# Patient Record
Sex: Male | Born: 1965 | Race: White | Hispanic: No | Marital: Single | Smoking: Never smoker
Health system: Southern US, Community
[De-identification: ages and names within clinical notes are randomized; demographics above are authoritative.]

## PROBLEM LIST (undated history)

## (undated) VITALS — BP 121/90 | HR 89 | Temp 97.3°F | Resp 20 | Ht 75.0 in | Wt 210.0 lb

## (undated) DIAGNOSIS — F32A Depression, unspecified: Secondary | ICD-10-CM

## (undated) DIAGNOSIS — D649 Anemia, unspecified: Secondary | ICD-10-CM

## (undated) DIAGNOSIS — Z8719 Personal history of other diseases of the digestive system: Secondary | ICD-10-CM

## (undated) DIAGNOSIS — F4001 Agoraphobia with panic disorder: Secondary | ICD-10-CM

## (undated) DIAGNOSIS — F849 Pervasive developmental disorder, unspecified: Secondary | ICD-10-CM

## (undated) DIAGNOSIS — N189 Chronic kidney disease, unspecified: Secondary | ICD-10-CM

## (undated) DIAGNOSIS — R569 Unspecified convulsions: Secondary | ICD-10-CM

## (undated) DIAGNOSIS — E785 Hyperlipidemia, unspecified: Secondary | ICD-10-CM

## (undated) DIAGNOSIS — F329 Major depressive disorder, single episode, unspecified: Secondary | ICD-10-CM

## (undated) DIAGNOSIS — F410 Panic disorder [episodic paroxysmal anxiety] without agoraphobia: Secondary | ICD-10-CM

## (undated) HISTORY — PX: WISDOM TOOTH EXTRACTION: SHX21

## (undated) HISTORY — DX: Agoraphobia with panic disorder: F40.01

## (undated) HISTORY — DX: Panic disorder (episodic paroxysmal anxiety): F41.0

## (undated) HISTORY — PX: MOLE REMOVAL: SHX2046

---

## 2002-05-20 ENCOUNTER — Emergency Department (HOSPITAL_COMMUNITY): Admission: EM | Admit: 2002-05-20 | Discharge: 2002-05-21 | Payer: Self-pay | Admitting: *Deleted

## 2003-11-21 ENCOUNTER — Inpatient Hospital Stay (HOSPITAL_COMMUNITY): Admission: EM | Admit: 2003-11-21 | Discharge: 2003-11-23 | Payer: Self-pay | Admitting: Psychiatry

## 2004-04-27 ENCOUNTER — Inpatient Hospital Stay (HOSPITAL_COMMUNITY): Admission: RE | Admit: 2004-04-27 | Discharge: 2004-05-08 | Payer: Self-pay | Admitting: Psychiatry

## 2004-05-09 ENCOUNTER — Emergency Department (HOSPITAL_COMMUNITY): Admission: EM | Admit: 2004-05-09 | Discharge: 2004-05-10 | Payer: Self-pay | Admitting: Emergency Medicine

## 2004-11-20 ENCOUNTER — Emergency Department (HOSPITAL_COMMUNITY): Admission: EM | Admit: 2004-11-20 | Discharge: 2004-11-20 | Payer: Self-pay | Admitting: Emergency Medicine

## 2004-12-14 ENCOUNTER — Emergency Department (HOSPITAL_COMMUNITY): Admission: EM | Admit: 2004-12-14 | Discharge: 2004-12-14 | Payer: Self-pay | Admitting: Emergency Medicine

## 2005-01-16 ENCOUNTER — Ambulatory Visit: Payer: Self-pay | Admitting: Family Medicine

## 2005-02-21 ENCOUNTER — Ambulatory Visit: Payer: Self-pay | Admitting: Family Medicine

## 2005-03-06 ENCOUNTER — Emergency Department (HOSPITAL_COMMUNITY): Admission: EM | Admit: 2005-03-06 | Discharge: 2005-03-07 | Payer: Self-pay | Admitting: Emergency Medicine

## 2005-03-19 ENCOUNTER — Ambulatory Visit (HOSPITAL_COMMUNITY): Admission: RE | Admit: 2005-03-19 | Discharge: 2005-03-19 | Payer: Self-pay | Admitting: Family Medicine

## 2005-07-18 ENCOUNTER — Ambulatory Visit: Payer: Self-pay | Admitting: Family Medicine

## 2006-01-15 IMAGING — CR DG LUMBAR SPINE COMPLETE 4+V
5 series · 5 of 5 positions shown · non-contrast
Comparison: none

CLINICAL DATA: Low to mid back pain x one month.  Fell out of bed. 
LUMBAR SPINE - 2 VIEWS:
There are no fractures or subluxations.  There is mild L4-5 interspace narrowing.  There are five lumbar type vertebrae present.  There are osteophytic changes noted at the L3-4 and L4-5 levels.

[t l-spine a.p.]
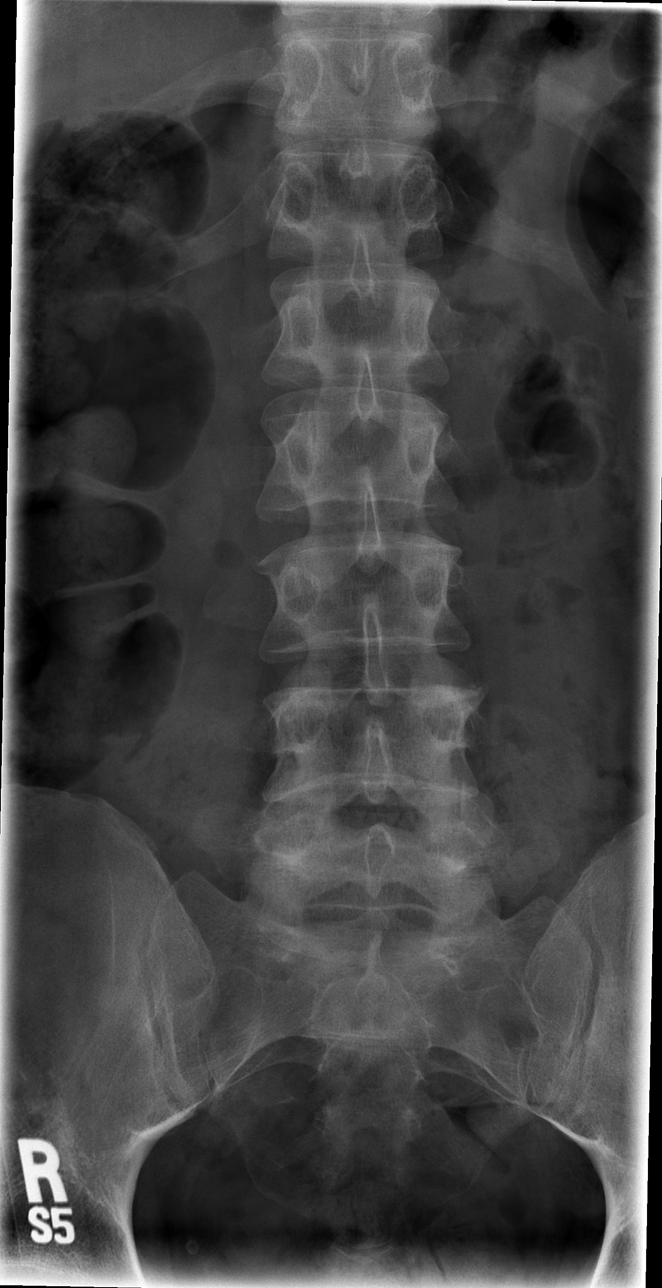

[t l-spine oblique exposure (1 of 2)]
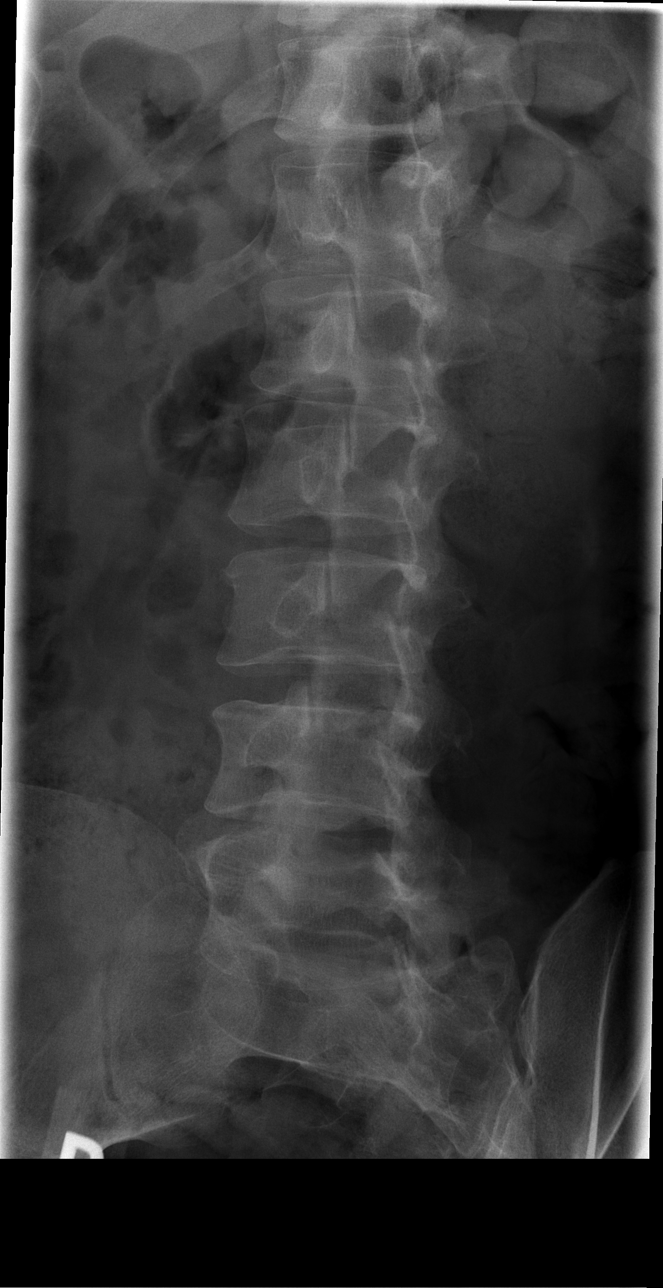

[t l-spine oblique exposure (2 of 2)]
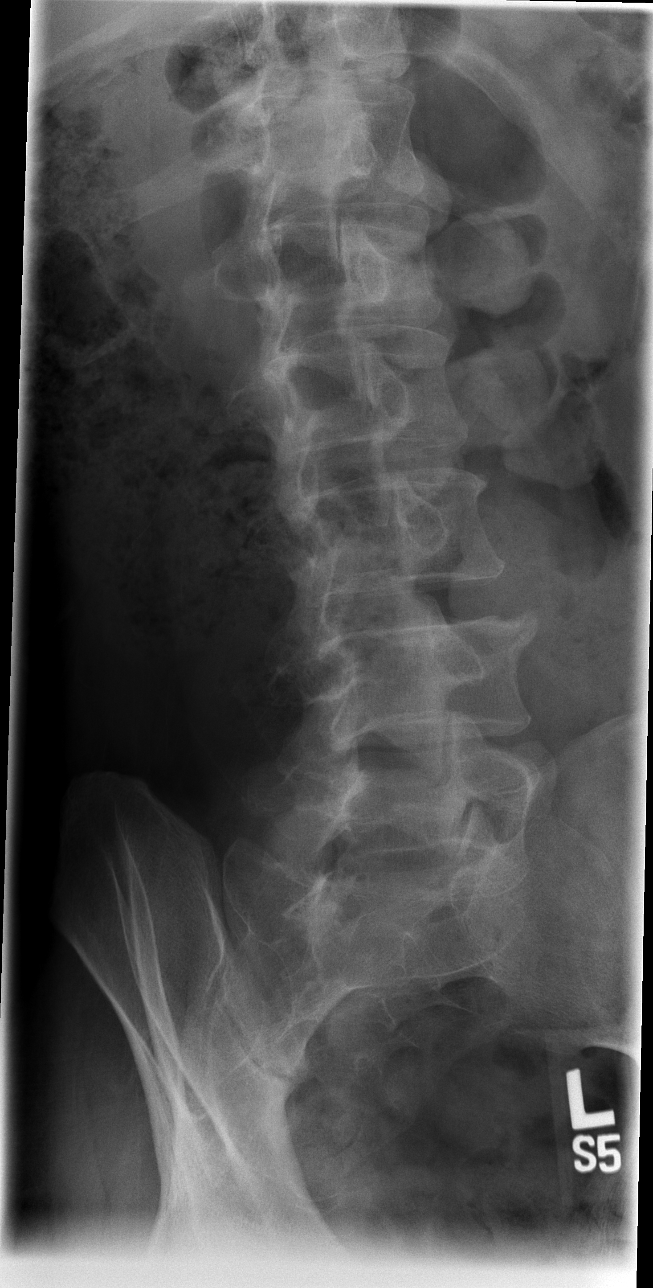

[t l-spine lat]
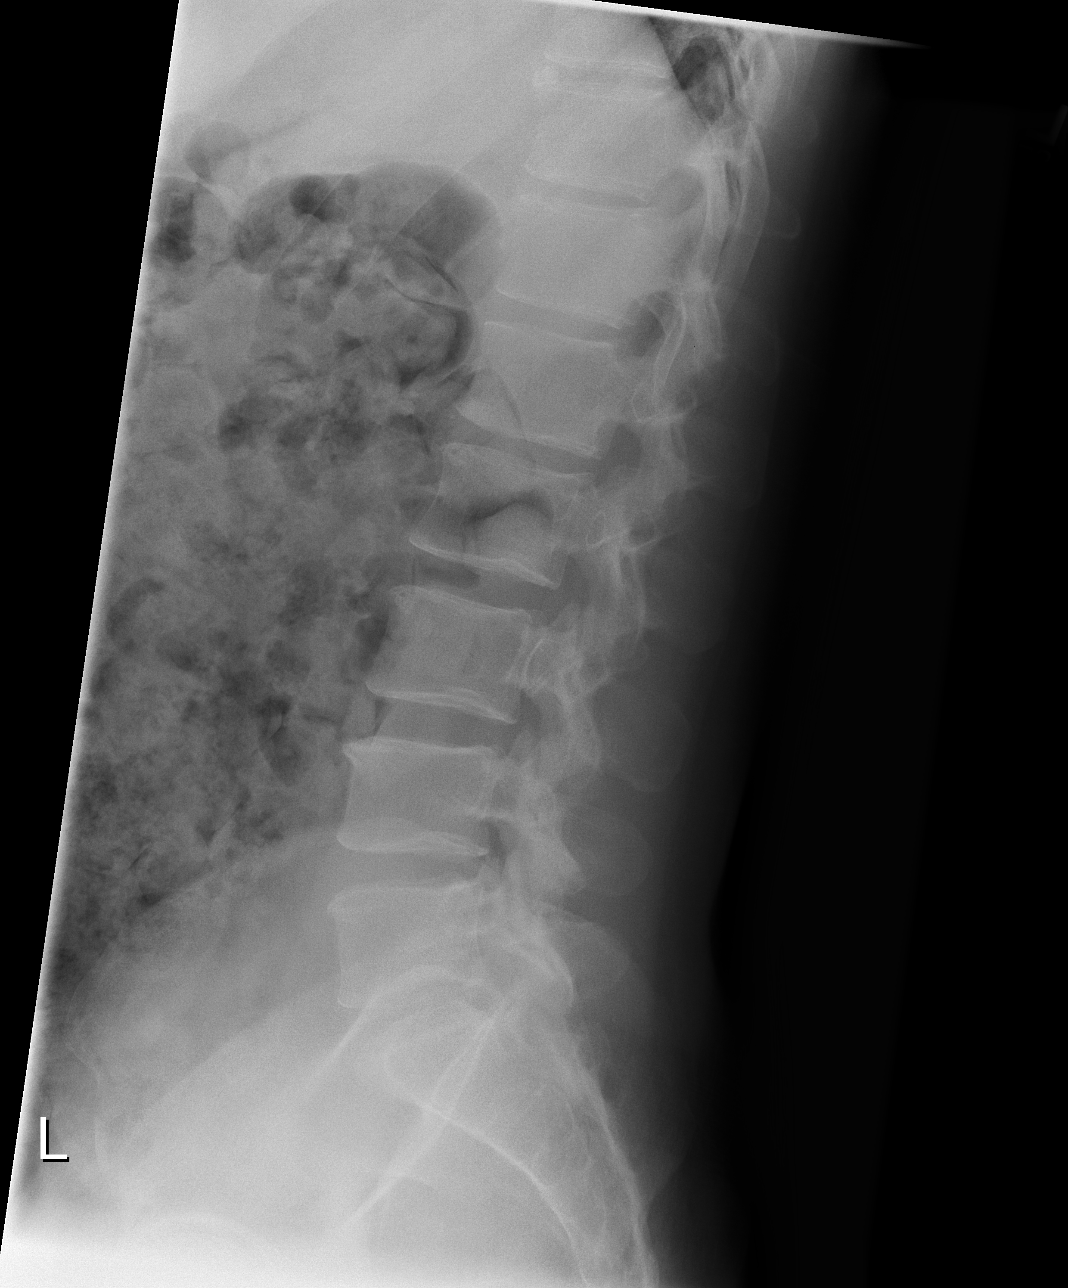

[t l-spine l5-s1 spot]
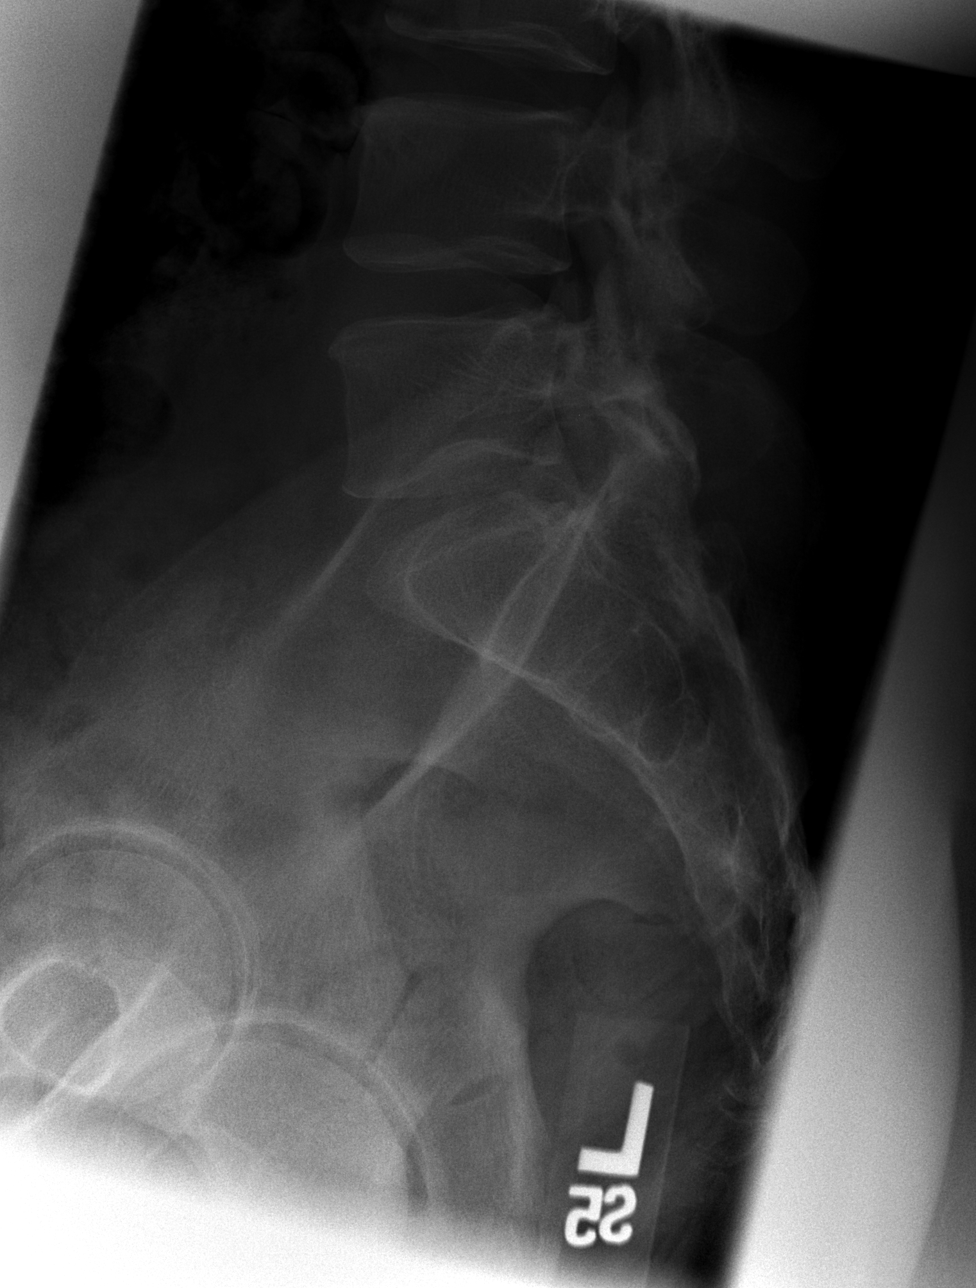

[5 of 5 positions shown; findings below may reference images not displayed]

IMPRESSION: Mild L4-5 interspace narrowing.  No fractures or subluxations.

## 2006-04-11 ENCOUNTER — Ambulatory Visit: Payer: Self-pay | Admitting: Family Medicine

## 2006-07-14 ENCOUNTER — Ambulatory Visit: Payer: Self-pay | Admitting: Family Medicine

## 2006-10-01 ENCOUNTER — Ambulatory Visit: Payer: Self-pay | Admitting: Psychiatry

## 2006-10-01 ENCOUNTER — Inpatient Hospital Stay (HOSPITAL_COMMUNITY): Admission: EM | Admit: 2006-10-01 | Discharge: 2006-10-06 | Payer: Self-pay | Admitting: Psychiatry

## 2007-01-22 ENCOUNTER — Emergency Department (HOSPITAL_COMMUNITY): Admission: EM | Admit: 2007-01-22 | Discharge: 2007-01-22 | Payer: Self-pay | Admitting: Emergency Medicine

## 2007-01-28 ENCOUNTER — Emergency Department (HOSPITAL_COMMUNITY): Admission: EM | Admit: 2007-01-28 | Discharge: 2007-01-28 | Payer: Self-pay | Admitting: Emergency Medicine

## 2007-07-07 ENCOUNTER — Ambulatory Visit: Payer: Self-pay | Admitting: Family Medicine

## 2007-07-07 LAB — CONVERTED CEMR LAB
Albumin: 4.6 g/dL (ref 3.5–5.2)
BUN: 11 mg/dL (ref 6–23)
Calcium: 9.1 mg/dL (ref 8.4–10.5)
Chloride: 106 meq/L (ref 96–112)
Creatinine, Ser: 1.21 mg/dL (ref 0.40–1.50)
Glucose, Bld: 116 mg/dL — ABNORMAL HIGH (ref 70–99)
HDL: 41 mg/dL (ref >39)
LDL Cholesterol: 55 mg/dL (ref 0–99)
Sodium: 144 meq/L (ref 135–145)
Total CHOL/HDL Ratio: 2.8
VLDL: 17 mg/dL (ref 0–40)

## 2007-07-23 ENCOUNTER — Ambulatory Visit: Payer: Self-pay | Admitting: Family Medicine

## 2007-08-06 ENCOUNTER — Ambulatory Visit: Payer: Self-pay | Admitting: Family Medicine

## 2007-08-06 LAB — CONVERTED CEMR LAB
Basophils Absolute: 0 10*3/uL (ref 0.0–0.1)
Basophils Relative: 0 % (ref 0–1)
HCT: 42.7 % (ref 39.0–52.0)
HDL: 40 mg/dL (ref >39)
Hemoglobin: 14.8 g/dL (ref 13.0–17.0)
LDL Cholesterol: 47 mg/dL (ref 0–99)
Lymphocytes Relative: 27 % (ref 12–46)
MCHC: 34.7 g/dL (ref 30.0–36.0)
MCV: 89.5 fL (ref 78.0–100.0)
RBC: 4.77 M/uL (ref 4.22–5.81)
Total CHOL/HDL Ratio: 2.7
Triglycerides: 103 mg/dL (ref <150)
VLDL: 21 mg/dL (ref 0–40)
WBC: 5.4 10*3/uL (ref 4.0–10.5)

## 2008-08-01 ENCOUNTER — Ambulatory Visit: Payer: Self-pay | Admitting: Internal Medicine

## 2008-08-01 ENCOUNTER — Encounter (INDEPENDENT_AMBULATORY_CARE_PROVIDER_SITE_OTHER): Payer: Self-pay | Admitting: Family Medicine

## 2008-08-01 LAB — CONVERTED CEMR LAB
ALT: 15 units/L (ref 0–53)
AST: 13 units/L (ref 0–37)
BUN: 12 mg/dL (ref 6–23)
Calcium: 9 mg/dL (ref 8.4–10.5)
Cholesterol: 154 mg/dL (ref 0–200)
Creatinine, Ser: 1.2 mg/dL (ref 0.40–1.50)
Eosinophils Relative: 1 % (ref 0–5)
Lymphocytes Relative: 27 % (ref 12–46)
MCV: 87.9 fL (ref 78.0–100.0)
Monocytes Absolute: 0.6 10*3/uL (ref 0.1–1.0)
Neutro Abs: 3.8 10*3/uL (ref 1.7–7.7)
Neutrophils Relative %: 61 % (ref 43–77)
Platelets: 158 10*3/uL (ref 150–400)
Potassium: 4 meq/L (ref 3.5–5.3)
RBC: 4.86 M/uL (ref 4.22–5.81)
Total Bilirubin: 0.4 mg/dL (ref 0.3–1.2)
Total Protein: 7.2 g/dL (ref 6.0–8.3)
VLDL: 26 mg/dL (ref 0–40)
Vit D, 1,25-Dihydroxy: 6 — ABNORMAL LOW (ref 30–89)
WBC: 6.3 10*3/uL (ref 4.0–10.5)

## 2008-09-16 ENCOUNTER — Ambulatory Visit: Payer: Self-pay | Admitting: Family Medicine

## 2008-12-23 ENCOUNTER — Ambulatory Visit: Payer: Self-pay | Admitting: Family Medicine

## 2009-01-26 ENCOUNTER — Ambulatory Visit: Payer: Self-pay | Admitting: Family Medicine

## 2009-01-26 LAB — CONVERTED CEMR LAB
Albumin: 4.9 g/dL (ref 3.5–5.2)
Calcium: 9.1 mg/dL (ref 8.4–10.5)
Cholesterol: 162 mg/dL (ref 0–200)
Creatinine, Ser: 1.15 mg/dL (ref 0.40–1.50)
Eosinophils Absolute: 0.1 10*3/uL (ref 0.0–0.7)
Eosinophils Relative: 2 % (ref 0–5)
HCT: 44.7 % (ref 39.0–52.0)
Hemoglobin: 15.1 g/dL (ref 13.0–17.0)
LDL Cholesterol: 94 mg/dL (ref 0–99)
Lymphs Abs: 1.3 10*3/uL (ref 0.7–4.0)
MCHC: 33.8 g/dL (ref 30.0–36.0)
Monocytes Absolute: 0.4 10*3/uL (ref 0.1–1.0)
Monocytes Relative: 8 % (ref 3–12)
Neutro Abs: 2.8 10*3/uL (ref 1.7–7.7)
Neutrophils Relative %: 63 % (ref 43–77)
RBC: 4.99 M/uL (ref 4.22–5.81)
Total Bilirubin: 0.5 mg/dL (ref 0.3–1.2)
Total CHOL/HDL Ratio: 4
Total Protein: 7.5 g/dL (ref 6.0–8.3)
Triglycerides: 134 mg/dL (ref <150)
VLDL: 27 mg/dL (ref 0–40)
WBC: 4.5 10*3/uL (ref 4.0–10.5)

## 2009-02-20 ENCOUNTER — Ambulatory Visit: Payer: Self-pay | Admitting: Family Medicine

## 2009-08-03 ENCOUNTER — Ambulatory Visit: Payer: Self-pay | Admitting: Family Medicine

## 2009-10-20 ENCOUNTER — Ambulatory Visit: Payer: Self-pay | Admitting: Family Medicine

## 2009-10-20 LAB — CONVERTED CEMR LAB
ALT: 16 units/L (ref 0–53)
AST: 14 units/L (ref 0–37)
Albumin: 4.6 g/dL (ref 3.5–5.2)
Alkaline Phosphatase: 75 units/L (ref 39–117)
Calcium: 9.1 mg/dL (ref 8.4–10.5)
Chloride: 107 meq/L (ref 96–112)
Cholesterol: 139 mg/dL (ref 0–200)
LDL Cholesterol: 74 mg/dL (ref 0–99)
Total Protein: 6.8 g/dL (ref 6.0–8.3)

## 2010-04-05 ENCOUNTER — Ambulatory Visit: Payer: Self-pay | Admitting: Family Medicine

## 2010-10-16 ENCOUNTER — Encounter (INDEPENDENT_AMBULATORY_CARE_PROVIDER_SITE_OTHER): Payer: Self-pay | Admitting: Family Medicine

## 2010-10-16 ENCOUNTER — Ambulatory Visit: Payer: Self-pay | Admitting: Family Medicine

## 2010-10-16 LAB — CONVERTED CEMR LAB
Alkaline Phosphatase: 101 units/L (ref 39–117)
BUN: 15 mg/dL (ref 6–23)
Basophils Relative: 0 % (ref 0–1)
CO2: 22 meq/L (ref 19–32)
Calcium: 8.8 mg/dL (ref 8.4–10.5)
Chloride: 104 meq/L (ref 96–112)
Creatinine, Ser: 1.08 mg/dL (ref 0.40–1.50)
Glucose, Bld: 110 mg/dL — ABNORMAL HIGH (ref 70–99)
Lymphocytes Relative: 21 % (ref 12–46)
MCHC: 33 g/dL (ref 30.0–36.0)
Monocytes Relative: 9 % (ref 3–12)
Neutro Abs: 4.5 10*3/uL (ref 1.7–7.7)
Neutrophils Relative %: 65 % (ref 43–77)
Platelets: 140 10*3/uL — ABNORMAL LOW (ref 150–400)
RBC: 4.8 M/uL (ref 4.22–5.81)
RDW: 13.5 % (ref 11.5–15.5)
Total Bilirubin: 0.5 mg/dL (ref 0.3–1.2)
Total Protein: 6.8 g/dL (ref 6.0–8.3)
WBC: 6.9 10*3/uL (ref 4.0–10.5)

## 2010-11-01 ENCOUNTER — Ambulatory Visit: Payer: Self-pay | Admitting: Family Medicine

## 2010-11-29 ENCOUNTER — Encounter (INDEPENDENT_AMBULATORY_CARE_PROVIDER_SITE_OTHER): Payer: Self-pay | Admitting: Family Medicine

## 2010-11-29 LAB — CONVERTED CEMR LAB
Basophils Absolute: 0 10*3/uL (ref 0.0–0.1)
Basophils Relative: 0 % (ref 0–1)
Chloride: 106 meq/L (ref 96–112)
Creatinine, Ser: 0.99 mg/dL (ref 0.40–1.50)
Eosinophils Absolute: 0.2 10*3/uL (ref 0.0–0.7)
Eosinophils Relative: 4 % (ref 0–5)
Hemoglobin: 14.3 g/dL (ref 13.0–17.0)
Lymphs Abs: 1.3 10*3/uL (ref 0.7–4.0)
MCHC: 33 g/dL (ref 30.0–36.0)
Monocytes Absolute: 0.6 10*3/uL (ref 0.1–1.0)
Monocytes Relative: 11 % (ref 3–12)
Neutro Abs: 3 10*3/uL (ref 1.7–7.7)
RBC: 4.72 M/uL (ref 4.22–5.81)

## 2011-04-19 NOTE — Discharge Summary (Signed)
NAME:  Brady Hoffman, Brady Hoffman                    ACCOUNT NO.:  [id number]   MEDICAL RECORD NO.:  [id number]                   PATIENT TYPE:  IPS   LOCATION:  0401                                 [facility]ILITY[facility]   PHYSICIAN:  Jeanice Lim, M.D.              DATE OF BIRTH:  1966-02-28   DATE OF ADMISSION:  11/21/2003  DATE OF DISCHARGE:  11/23/2003                                 DISCHARGE SUMMARY   IDENTIFYING DATA:  This is a 45 year old single Caucasian male voluntarily  admitted.  He presented with a history of psychosis and auditory  hallucinations but denied specific stressors.  He had been off  benzodiazepines for two days, out of medicines at the group home.  Apparently they ran out.  Voices were threatening.  Due to psychotic  symptoms, agitation, and feeling unsafe, the patient was admitted.   MEDICATIONS:  1. Zoloft.  2. Cogentin.  3. Colace.  4. Clozapine.  5. Klonopin.  6. Fish oil.   DRUG ALLERGIES:  Possibly RISPERDAL makes his head foggy.   PHYSICAL EXAMINATION:  GENERAL:  Essentially within normal limits.  NEUROLOGIC:  Nonfocal.   LABORATORY DATA:  Routine admission labs: Within normal limits.   MENTAL STATUS EXAM:  Alert, polite, young male, casually dressed.  Speech:  Clear.  Mood: Fair, mildly anxious.  Affect: Restricted.  Thought process:  Goal directed, reporting feeling that the voices were quite threatening at  the group home when he ran out of medicines but now that he had been  restarted on medicine, he was already beginning to feel better.  Cognitive:  Intact.  Judgment and insight were poor.   ADMISSION DIAGNOSES:   AXIS I:  1. Schizoaffective disorder.  2. Depressive disorder, not otherwise specified.  3. Rule out benzodiazepine withdrawal secondary to group home running out of     this medicine somehow.   AXIS II:  Deferred.   AXIS III:  1. Urinary tract infection.  2. Constipation.   AXIS IV:  Moderate problems with primary support  group and other  psychosocial issues.   AXIS V:  [id number]   HOSPITAL COURSE:  The patient was admitted, ordered routine p.r.n.  medications, underwent further monitoring, and was encouraged to participate  in individual, group, and milieu therapy.  The patient complained of anxiety  and increased voices threatening prior to admission,  the group home had run  out of Ativan for three days prior to admission and he was doing better once  placed back on Ativan.  He rapidly improved, reported no auditory  hallucinations and stabilization of mood and feeling calm and safe with no  threatening voices.  No dangerous ideation or psychotic symptoms at the time  of discharge.  Markedly improved.   DISCHARGE MEDICATIONS:  1. Fish oil omega 3000 mg two of these b.i.d.  2. Clozaril 100 mg in the morning and at 3 p.m. and two and a half q.h.s.  3.  Cogentin 1 mg b.i.d.  4. Ativan 0.5 mg t.i.d. and p.r.n. anxiety.  5. Colace two two times a day.  6. Zoloft 50 mg one and a half q.a.m.  7. Benadryl 25 mg q.12h. p.r.n. anxiety.   FOLLOW UP:  The patient was to follow up with [facility] with Dr. Lang Snow on Wednesday, December 29 at 9:30.   DISCHARGE DIAGNOSES:   AXIS I:  1. Schizoaffective disorder.  2. Depressive disorder, not otherwise specified.  3. Rule out benzodiazepine withdrawal secondary to group home running out of     this medicine somehow.   AXIS II:  Deferred.   AXIS III:  1. Urinary tract infection.  2. Constipation.   AXIS IV:  Moderate problems with primary support group and other  psychosocial issues.   AXIS V:  Global assessment of functioning on discharge was 50-55.                                               Jeanice Lim, M.D.    JEM/MEDQ  D:  12/21/2003  T:  12/21/2003  Job:  [id number]

## 2011-04-19 NOTE — Discharge Summary (Signed)
NAMETEANCUM, BRULE NO.:  [id number]   MEDICAL RECORD NO.:  [id number]          PATIENT TYPE:  IPS   LOCATION:  0300                          FACILITY:  [facility]   PHYSICIAN:  Anselm Jungling, MD  DATE OF BIRTH:  January 03, 1966   DATE OF ADMISSION:  10/01/2006  DATE OF DISCHARGE:  10/06/2006                               DISCHARGE SUMMARY   IDENTIFYING DATA AND REASON FOR ADMISSION:  The patient is a 45 year old  single white male with a long history of schizoaffective disorder.  He  had been living at [facility] for 4 years.  He was admitted after  cutting himself with a broken light bulb in response to auditory  hallucinations, which he stated had been getting stronger over the past  week.  His last [facility] admission had been approximately 2 years prior.  He  is a patient of Dr. Hortencia Pilar at [facility], seen 1  week prior to admission.  He was on a regimen of Clozaril and Prolixin,  Cogentin, and Effexor.  The patient appeared to have possibly mild  mental retardation upon admission, although this was not clear, see  below.   Please refer to the admission note for further details pertaining to the  symptoms, circumstances, and history that led to his hospitalization.  He was given initial Axis I diagnosis of schizoaffective disorder, with  hallucinosis.   MEDICAL AND LABORATORY:  The patient was medically and physically  assessed by the psychiatric nurse practitioner.  He was given his usual  Crestor 10 mg daily, Protonix 40 mg daily for GERD, and Colace 100 mg  daily.  There were no acute medical issues.   HOSPITAL COURSE:  The patient was admitted to the adult inpatient  psychiatric service.  He presented as a tall, normally developed and  healthy-appearing male, who appeared younger than 25.  He was pleasant,  polite, calm, and fully oriented.  His affect was somewhat constricted,  with a somewhat mechanical quality to his mannerisms and  voice.  There  were no delusional statements.  He did admit to auditory hallucinations,  but had good insight into them.  He denied suicidal ideation, and  verbalized a strong desire for help.   Isaia was continued on Clozaril in doses of 100 mg q.a.m. and 200 mg at  bedtime, with Prolixin 5 mg twice daily, and Cogentin 1 mg 3 times  daily.  He was also continued on Effexor XR 75 mg daily.  During the  course of his inpatient stay, his auditory hallucinations diminished.  We came to understand during the course of his hospital stay that he is  really a bright individual, and possibly has developmental issues along  the lines of autistic spectrum disorder.  We talked with him and family  about the possibility of him becoming involved in vocational  rehabilitation, which he was quite open to.   On the sixth hospital day, he appeared to be quite appropriate for  discharge.  His mother had been contacted in regards to discharge  planning.   AFTERCARE:  The patient was discharged with a plan  to follow up at the  [facility] with Dr. Lang Snow on October 09, 2006.   DISCHARGE MEDICATIONS:  1. Clozaril 100 mg q.a.m. and 200 mg at bedtime.  2. Prolixin 5 mg twice daily.  3. Cogentin 1 mg t.i.d.  4. Colace 100 mg daily.  5. Protonix 40 mg daily.  6. Effexor XR 75 mg daily.  7. Crestor 10 mg daily.   DISCHARGE DIAGNOSES:   AXIS I:  Schizoaffective disorder, with psychotic features, resolving.   AXIS II:  No diagnosis.   AXIS III:  History of gastroesophageal reflux disease.  Hypertension.   AXIS IV:  Stressors severe.   AXIS V:  Global assessment of function on discharge 60.      Anselm Jungling, MD  Electronically Signed     SPB/MEDQ  D:  11/03/2006  T:  11/04/2006  Job:  [id number]

## 2011-04-19 NOTE — Discharge Summary (Signed)
NAME:  Brady Hoffman, Brady Hoffman                    ACCOUNT NO.:  [id number]   MEDICAL RECORD NO.:  [id number]                   PATIENT TYPE:  IPS   LOCATION:  0500                                 FACILITY:  [facility]   PHYSICIAN:  Jeanice Lim, M.D.              DATE OF BIRTH:  29-May-1966   DATE OF ADMISSION:  04/27/2004  DATE OF DISCHARGE:  05/08/2004                                 DISCHARGE SUMMARY   IDENTIFYING INFORMATION:  This is a 45 year old single Caucasian male  voluntarily admitted with a history of schizoaffective disorder.  Multiple  hospitalizations.  Came with auditory hallucinations reporting that he was  worthless, increased anxiety and thoughts of hurting self with voices  telling him worthless, grumbling at him, difficult to remember what they  said, as per patient.  Somewhat guarded.  Klonopin was changed to p.r.n. and  had not taken for several days prior to admission, which may have been  reason for decompensation.   PAST PSYCHIATRIC HISTORY:  He is followed up by Dr. Hortencia Pilar at [facility].  This is his second [facility]  admission.  Had been in [facility] in April of 2005 and [facility] in March of 2005.   MEDICATIONS:  Clozapine 100 mg b.i.d. and 250 mg q.h.s., Cogentin 1 mg  b.i.d., Effexor XR 112.5 mg q.h.s., fish oil 2000 mg daily, MiraLax,  Klonopin 1 mg q.h.s. and Colace.   ALLERGIES:  RISPERDAL and PROLIXIN (causing severe EPS, severe sensitivity  to dystonic reactions, therefore on Clozaril).   LABORATORY DATA:  CBC, CMET and TSH within normal limits.   MENTAL STATUS EXAM:  Readily awakens.  Alert, pleasant, cooperative.  No  signs of EPS.  Speech normal.  Mood euthymic.  Thought process mostly goal  directed.  Positive auditory hallucinations frustrating patient.  Fearful of  worsening of symptoms, afraid he was going to have a full relapse and lose  control.  Positive suicidal thoughts.   Contracting for safety in the  hospital.  Cognitively intact.  Judgment and insight fair.   ADMISSION DIAGNOSES:   AXIS I:  Schizoaffective disorder, sensitive to neuroleptics, on Clozaril,  acute decompensation.   AXIS II:  Deferred.   AXIS III:  1. Constipation.  2. Gastroesophageal reflux disease.   AXIS IV:  Moderate (limited support system and chronic mental illness).   AXIS V:  28/55.   HOSPITAL COURSE:  The patient was admitted and ordered routine p.r.n.  medications and underwent further monitoring.  Was encouraged to participate  in individual, group and milieu therapy.  Was placed on Klonopin, scheduled  again, and other medications were resumed.  CBC was monitored and Clozaril  was optimized targeting psychotic symptoms.  The patient tolerated increase  in doses.  However, as voices decreased, sedation and other side effects  from Clozaril increased, he was monitored and doses adjusted to the lowest  effective dose that  he tolerated best with control of symptoms.   CONDITION ON DISCHARGE:  He was discharged in improved condition, tolerating  medication with significant decrease in psychotic symptoms with no overt  psychotic symptoms, no command hallucinations and occasional noises in the  background only.  Denied any dangerous ideation including no suicidal  ideation.  Feeling hopeful, future-oriented with improved judgment and  insight.  He was given medication education.   DISCHARGE MEDICATIONS:  1. Protonix 40 mg q.a.m.  2. Clozaril 100 mg b.i.d. and 3-1/2 q.h.s.  3. MiraLax powder 17 grams daily.  4. Colace 100 mg b.i.d.  5. Cogentin 0.5 mg q.a.m. and 1 mg q.h.s.  6. Effexor XR 150 mg q.a.m.   FOLLOW UP:  The patient was to follow up with [facility]  on Wednesday, May 09, 2004 at 10:30 a.m.   DISCHARGE DIAGNOSES:   AXIS I:  Schizoaffective disorder, sensitive to neuroleptics, on Clozaril,  acute decompensation.   AXIS II:   Deferred.   AXIS III:  1. Constipation.  2. Gastroesophageal reflux disease.   AXIS IV:  Moderate (limited support system and chronic mental illness).   AXIS V:  Global Assessment of Functioning on discharge 50-55.                                               Jeanice Lim, M.D.    Lovie Macadamia  D:  05/23/2004  T:  05/24/2004  Job:  [id number]

## 2011-04-19 NOTE — H&P (Signed)
Brady Hoffman, Brady NO.:  [id number]   MEDICAL RECORD NO.:  [id number]          PATIENT TYPE:  IPS   LOCATION:  0300                          FACILITY:  [facility]   PHYSICIAN:  Anselm Jungling, MD  DATE OF BIRTH:  [birth year]   DATE OF ADMISSION:  10/01/2006  DATE OF DISCHARGE:                         PSYCHIATRIC ADMISSION ASSESSMENT   IDENTIFYING INFORMATION:  This is a 45 year old white male who is single.  This is a voluntary admission.   HISTORY OF PRESENT ILLNESS:  This patient was taken to the emergency  department by the staff at [facility] after he broke a light  bulb and made superficial cuts to his left wrist.  These are essentially  scratches.  They did not require suturing.  The patient reports that he had  been the having two weeks increased auditory hallucinations with  persecutorial content and the voices getting stronger and more demanding.  On the day of admission, the voices were making threatening statements that,  if he did not go ahead and attempt to kill himself then the voices would not  have the power to go ahead and kill his mother, which frightened him.  He  felt unable to cope with these.  He reports that the character of the voices  has become increasingly worse over the past couple of weeks.  He does  endorse depressed mood, having a lot of idle time on his hands, unable to go  outside or remain active.  Basically, he stays in his room all the time.  He  is doing some correspondence education courses but, because of the  neighborhood where the group home is in, he is unable to get outside and get  any exercise every day and generally endorses a lot of boredom.  He denies  any recent conflict with the group home staff.  Denies any conflict with his  mother.  Thinks that the voices focus on her because she is his most  significant person in his life.  Denies homicidal thoughts.   PAST PSYCHIATRIC HISTORY:  This is a patient  with a long history of  schizoaffective disorder that is followed by Dr. Hortencia Pilar at [facility].  This is his third admission to [facility] since 2004 with his last admission Apr 27, 2004 to May 08, 2004 for a  brief stay to help control some auditory hallucinations.  He has been in the  group home for approximately four years now with his other most recent state  being [facility] where he was for 1-1/2 years starting in 2002 until he  was discharged and went to live about four years ago at the [facility].  No history of substance abuse.  No history of seizures, blackouts,  memory loss or head injury.   SOCIAL HISTORY:  This is a single white male, never married, no children, 29  years of age, who was adopted by his parents at the age of 2 months, never  knew his biological parents.  Has been living in [facility] for the  past  four years.  Prior to that, lived with his adoptive mother here in  [place] to whom he is close.  She is supportive.  He goes home every two  weeks.  He has recently obtained his driver's license and his mother has a  truck at home that he is allowed to drive.  He would like to live in an  independent living situation but apparently he has had problems with  medication noncompliance in the past by his own report and his mother fears  that he would not take his medications regularly unless he was closely  supervised.  No current legal charges.  No history of substance abuse.   FAMILY HISTORY:  The patient is adopted and family is not known.   ALCOHOL/DRUG HISTORY:  The patient denies any current or past substance  abuse.  He is a nonsmoker.   MEDICAL HISTORY:  The patient is followed by Dr. Dow Adolph at  [facility].  Medical problems are gastroesophageal reflux disease,  nonspecific urethritis and dyslipidemia.  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  He takes  Urised p.r.n. for this.  Has been started within the past year on Crestor  for elevated cholesterol which he is tolerating well.  Has been stable on  clozapine for approximately a year now at 100 mg three times a day and is  receiving once a month CBC with diff to monitor his white count.  Gastroesophageal reflux disease responds well to Protonix.   CURRENT MEDICATIONS:  The patient takes a daily fiber supplement to control  his gastroesophageal reflux disease, Ativan 0.5 mg t.i.d. p.r.n. for  anxiety, Citrucel fiber laxative 100 mg a day, clozapine 100 mg t.i.d.,  Cogentin 1 mg p.o. t.i.d., Colace 100 mg daily, Crestor 10 mg daily, Effexor  XR 75 mg daily, Prolixin 5 mg b.i.d., Protonix 40 mg daily and Urised 1  tablet as needed, dose is unclear.   ALLERGIES:  No known drug allergies but RISPERDAL, he states, has made his  head feel funny.   REVIEW OF SYSTEMS:  Remarkable for the patient complaining of tardive  movements that affect his drum playing.  He has a set of drums at home.  Having a lot more difficulty playing these.  Feeling a little bit slowed  down.  Difficulty moving his fingers and he recognizes tardive movements of  his tongue.  Also having some subjective complaints of back stiffness,  mostly in the early morning.  Denies constipation but dysuria, mostly  burning when he urinates, occurs rarely, no nocturia.  No nausea, no  vomiting.  Occasional problems with slightly blurred vision and he recently  was prescribed corrective lenses.   PHYSICAL EXAMINATION:  Well-nourished, well-developed male who is in no  acute distress, pleasant, cooperative.  Full physical exam was done in the  emergency room.  It is noted in the record.  He, on admission to the unit,  is 6 feet 3 inches tall, 198 pounds, temperature 97.6, pulse 114, respirations 20, blood pressure  117/88.  Blood pressure and pulse are  normalized today with blood pressure 115/84 (sitting down) and pulse is 88.  Physical exam done here in the unit is remarkable for some tardive movements  with vermiform movements of his tongue, very slight and rather intermittent.  He does have some muscle stiffness with a very slight cogwheeling of  his  upper extremities and bradykinesia of upper and lower extremities that is  mild.  Extraocular movements are normal.  PERRL.  His Romberg without  findings.  Sensory is intact.   LABORATORY DATA:  CBC is within normal limits, platelets 150, WBC 8.5,  hemoglobin 15.1, hematocrit 42.3.  Chemistries are within normal limits.  Casual glucose is 112, BUN 12, creatinine 1.2.  Urine drug screen negative  for all substances.  Liver enzymes and TSH are currently pending.  Alcohol  level was negative.   MENTAL STATUS EXAM:  Fully alert male, pleasant, pacing in the hall, no  anxiety or agitation, calm, slightly shuffling gait, some psychomotor  slowing and bradykinesia to his movements, cooperative, smiles, appreciates  humor, initiates conversation.  Speech is normal in tone, a little bit  slowed in pace.  Some response latency, does think over his answers.  Mood  is depressed, obviously expresses quite a lot of boredom, feeling a little  bit discouraged in his station in life right now and would like to get out  more and do more activities.  Looks forward to every two-week visits to his  mother's.  Thought process logical and coherent, is having some auditory  hallucinations today which she says are mild and distant, feels that he is  able to be safe on the unit, has been participating in all activities  without difficulty, has clearly had some suicidal ideation, is endorsing  some depressed mood.  Cognitively, he is intact.  Insight is good.  Impulse  control and judgment within normal limits.  Calculations intact.  Concentration is intact.   DISCHARGE  DIAGNOSES:  AXIS I:  Schizoaffective disorder not otherwise  specified.  Rule out depressed mood.  AXIS II:  Deferred.  AXIS III:  Gastroesophageal reflux disease, urethritis not otherwise  specified, dyslipidemia, rule out medication-induced dyskinesia.  AXIS IV:  Severe (problems with social isolation and idle time).  AXIS V:  Current 28-31; past year 22.   PLAN:  To voluntarily admit the patient with 15-minute checks in place to  alleviate his suicidal ideation.  We have placed him on our stabilization  program.  We are going to increase his Effexor slightly from 75 mg every  morning and add 37.5 mg in the afternoon.  We are awaiting a clozapine level  which is currently pending in the lab.  Will resume his other routine  medications and see if we can make referrals possibly for vocational rehab  and possibly for some job opportunities.   ESTIMATED LENGTH OF STAY:  Five days.      Margaret A. Lorin Picket, N.P.      Anselm Jungling, MD Electronically Signed    MAS/MEDQ  D:  10/02/2006  T:  10/02/2006  Job:  [phone]

## 2011-10-31 ENCOUNTER — Inpatient Hospital Stay (HOSPITAL_COMMUNITY)
Admission: EM | Admit: 2011-10-31 | Discharge: 2011-11-02 | DRG: 392 | Disposition: A | Discharge status: Home / Self Care | Payer: Medicare Other | Attending: Internal Medicine | Admitting: Internal Medicine

## 2011-10-31 ENCOUNTER — Emergency Department (HOSPITAL_COMMUNITY): Payer: Medicare Other

## 2011-10-31 DIAGNOSIS — R109 Unspecified abdominal pain: Secondary | ICD-10-CM | POA: Diagnosis present

## 2011-10-31 DIAGNOSIS — N289 Disorder of kidney and ureter, unspecified: Secondary | ICD-10-CM

## 2011-10-31 DIAGNOSIS — N179 Acute kidney failure, unspecified: Secondary | ICD-10-CM | POA: Diagnosis present

## 2011-10-31 DIAGNOSIS — D131 Benign neoplasm of stomach: Secondary | ICD-10-CM | POA: Diagnosis present

## 2011-10-31 DIAGNOSIS — K922 Gastrointestinal hemorrhage, unspecified: Secondary | ICD-10-CM | POA: Diagnosis present

## 2011-10-31 DIAGNOSIS — K5900 Constipation, unspecified: Secondary | ICD-10-CM | POA: Diagnosis present

## 2011-10-31 DIAGNOSIS — K209 Esophagitis, unspecified without bleeding: Principal | ICD-10-CM | POA: Diagnosis present

## 2011-10-31 DIAGNOSIS — R1110 Vomiting, unspecified: Secondary | ICD-10-CM

## 2011-10-31 DIAGNOSIS — F251 Schizoaffective disorder, depressive type: Secondary | ICD-10-CM | POA: Diagnosis present

## 2011-10-31 DIAGNOSIS — E785 Hyperlipidemia, unspecified: Secondary | ICD-10-CM | POA: Diagnosis present

## 2011-10-31 DIAGNOSIS — K920 Hematemesis: Secondary | ICD-10-CM | POA: Diagnosis present

## 2011-10-31 HISTORY — DX: Hyperlipidemia, unspecified: E78.5

## 2011-10-31 HISTORY — DX: Personal history of other diseases of the digestive system: Z87.19

## 2011-10-31 LAB — DIFFERENTIAL
Basophils Absolute: 0 10*3/uL (ref 0.0–0.1)
Eosinophils Absolute: 0 10*3/uL (ref 0.0–0.7)
Lymphocytes Relative: 8 % — ABNORMAL LOW (ref 12–46)
Lymphocytes Relative: 12 % (ref 12–46)
Lymphs Abs: 0.5 10*3/uL — ABNORMAL LOW (ref 0.7–4.0)
Lymphs Abs: 0.6 10*3/uL — ABNORMAL LOW (ref 0.7–4.0)
Monocytes Absolute: 1.1 10*3/uL — ABNORMAL HIGH (ref 0.1–1.0)
Monocytes Relative: 19 % — ABNORMAL HIGH (ref 3–12)
Neutro Abs: 4.4 10*3/uL (ref 1.7–7.7)
Neutro Abs: 3.8 10*3/uL (ref 1.7–7.7)
Neutrophils Relative %: 73 % (ref 43–77)

## 2011-10-31 LAB — CBC
Hemoglobin: 13.9 g/dL (ref 13.0–17.0)
Hemoglobin: 12.8 g/dL — ABNORMAL LOW (ref 13.0–17.0)
MCH: 29.6 pg (ref 26.0–34.0)
MCHC: 34.6 g/dL (ref 30.0–36.0)
Platelets: 121 10*3/uL — ABNORMAL LOW (ref 150–400)
RBC: 4.32 MIL/uL (ref 4.22–5.81)
RDW: 12.9 % (ref 11.5–15.5)
WBC: 6 10*3/uL (ref 4.0–10.5)
WBC: 5.5 10*3/uL (ref 4.0–10.5)

## 2011-10-31 LAB — COMPREHENSIVE METABOLIC PANEL
AST: 388 U/L — ABNORMAL HIGH (ref 0–37)
Albumin: 3.6 g/dL (ref 3.5–5.2)
BUN: 39 mg/dL — ABNORMAL HIGH (ref 6–23)
Creatinine, Ser: 2.19 mg/dL — ABNORMAL HIGH (ref 0.50–1.35)
Potassium: 4 mEq/L (ref 3.5–5.1)
Total Protein: 6.6 g/dL (ref 6.0–8.3)

## 2011-10-31 LAB — MAGNESIUM: Magnesium: 2.3 mg/dL (ref 1.5–2.5)

## 2011-10-31 LAB — BASIC METABOLIC PANEL
BUN: 39 mg/dL — ABNORMAL HIGH (ref 6–23)
CO2: 18 mEq/L — ABNORMAL LOW (ref 19–32)
Calcium: 8.1 mg/dL — ABNORMAL LOW (ref 8.4–10.5)
Chloride: 106 mEq/L (ref 96–112)
Creatinine, Ser: 2.25 mg/dL — ABNORMAL HIGH (ref 0.50–1.35)
Glucose, Bld: 131 mg/dL — ABNORMAL HIGH (ref 70–99)

## 2011-10-31 LAB — ABO/RH: ABO/RH(D): O NEG

## 2011-10-31 LAB — PROTIME-INR
INR: 1.15 (ref 0.00–1.49)
Prothrombin Time: 14.3 seconds (ref 11.6–15.2)
Prothrombin Time: 14.9 seconds (ref 11.6–15.2)

## 2011-10-31 LAB — LIPASE, BLOOD: Lipase: 7 U/L — ABNORMAL LOW (ref 11–59)

## 2011-10-31 LAB — APTT: aPTT: 29 seconds (ref 24–37)

## 2011-10-31 LAB — PHOSPHORUS: Phosphorus: 3.7 mg/dL (ref 2.3–4.6)

## 2011-10-31 MED ORDER — CLOZAPINE 100 MG PO TABS
400.0000 mg | ORAL_TABLET | Freq: Every day | ORAL | Status: DC
  Administered 2011-10-31 – 2011-11-01 (×2): 400 mg via ORAL
  Filled 2011-10-31 (×3): qty 4

## 2011-10-31 MED ORDER — FLUPHENAZINE HCL 5 MG PO TABS
5.0000 mg | ORAL_TABLET | Freq: Every day | ORAL | Status: DC
  Administered 2011-10-31: 10 mg via ORAL
  Administered 2011-11-01: 5 mg via ORAL
  Administered 2011-11-02: 10 mg via ORAL
  Filled 2011-10-31 (×3): qty 2

## 2011-10-31 MED ORDER — FLUTICASONE PROPIONATE 50 MCG/ACT NA SUSP
2.0000 | Freq: Every evening | NASAL | Status: DC | PRN
Start: 2011-10-31 — End: 2011-11-02
  Filled 2011-10-31: qty 16

## 2011-10-31 MED ORDER — VITAMIN D (ERGOCALCIFEROL) 1.25 MG (50000 UNIT) PO CAPS: 50000.0000 [IU] | ORAL_CAPSULE | ORAL | Status: DC

## 2011-10-31 MED ORDER — LORAZEPAM 0.5 MG PO TABS: 0.5000 mg | ORAL_TABLET | Freq: Three times a day (TID) | ORAL | Status: DC | PRN

## 2011-10-31 MED ORDER — ONDANSETRON HCL 4 MG/2ML IJ SOLN
4.0000 mg | Freq: Three times a day (TID) | INTRAMUSCULAR | Status: AC | PRN
  Administered 2011-10-31: 4 mg via INTRAVENOUS
  Filled 2011-10-31: qty 2

## 2011-10-31 MED ORDER — LORATADINE 10 MG PO TABS
10.0000 mg | ORAL_TABLET | Freq: Every day | ORAL | Status: DC
  Administered 2011-10-31 – 2011-11-02 (×3): 10 mg via ORAL
  Filled 2011-10-31 (×3): qty 1

## 2011-10-31 MED ORDER — BENZTROPINE MESYLATE 1 MG PO TABS
1.0000 mg | ORAL_TABLET | Freq: Every day | ORAL | Status: DC
  Administered 2011-10-31 – 2011-11-01 (×2): 1 mg via ORAL
  Filled 2011-10-31 (×3): qty 1

## 2011-10-31 MED ORDER — SODIUM CHLORIDE 0.9 % IV BOLUS (SEPSIS)
500.0000 mL | Freq: Once | INTRAVENOUS | Status: AC
  Administered 2011-10-31: 1000 mL via INTRAVENOUS

## 2011-10-31 MED ORDER — ONDANSETRON HCL 4 MG/2ML IJ SOLN
4.0000 mg | Freq: Once | INTRAMUSCULAR | Status: AC
  Administered 2011-10-31: 4 mg via INTRAVENOUS
  Filled 2011-10-31: qty 2

## 2011-10-31 MED ORDER — PANTOPRAZOLE SODIUM 40 MG IV SOLR
40.0000 mg | Freq: Two times a day (BID) | INTRAVENOUS | Status: DC
  Administered 2011-10-31 – 2011-11-02 (×4): 40 mg via INTRAVENOUS
  Filled 2011-10-31 (×5): qty 40

## 2011-10-31 MED ORDER — CLOZAPINE 100 MG PO TABS
100.0000 mg | ORAL_TABLET | Freq: Two times a day (BID) | ORAL | Status: DC
  Administered 2011-11-01 – 2011-11-02 (×3): 100 mg via ORAL
  Filled 2011-10-31 (×4): qty 1

## 2011-10-31 MED ORDER — SODIUM CHLORIDE 0.9 % IV SOLN
999.0000 mL | INTRAVENOUS | Status: DC
  Administered 2011-10-31: 08:00:00 via INTRAVENOUS

## 2011-10-31 MED ORDER — SIMVASTATIN 40 MG PO TABS
40.0000 mg | ORAL_TABLET | Freq: Every day | ORAL | Status: DC
  Administered 2011-10-31 – 2011-11-01 (×2): 40 mg via ORAL
  Filled 2011-10-31 (×3): qty 1

## 2011-10-31 MED ORDER — VITAMIN D (ERGOCALCIFEROL) 1.25 MG (50000 UNIT) PO CAPS
50000.0000 [IU] | ORAL_CAPSULE | ORAL | Status: DC
  Filled 2011-10-31: qty 1

## 2011-10-31 MED ORDER — PANTOPRAZOLE SODIUM 40 MG IV SOLR
40.0000 mg | Freq: Once | INTRAVENOUS | Status: AC
  Administered 2011-10-31: 40 mg via INTRAVENOUS
  Filled 2011-10-31: qty 40

## 2011-10-31 MED ORDER — DOCUSATE SODIUM 100 MG PO CAPS
200.0000 mg | ORAL_CAPSULE | Freq: Two times a day (BID) | ORAL | Status: DC
  Administered 2011-10-31 – 2011-11-02 (×4): 200 mg via ORAL
  Filled 2011-10-31 (×6): qty 2

## 2011-10-31 MED ORDER — SODIUM CHLORIDE 0.9 % IV SOLN: INTRAVENOUS | Status: AC

## 2011-10-31 MED ORDER — VENLAFAXINE HCL ER 150 MG PO CP24
150.0000 mg | ORAL_CAPSULE | ORAL | Status: DC
  Administered 2011-11-01 – 2011-11-02 (×2): 150 mg via ORAL
  Filled 2011-10-31 (×3): qty 1

## 2011-10-31 MED ORDER — ONDANSETRON HCL 4 MG/2ML IJ SOLN: 4.0000 mg | Freq: Three times a day (TID) | INTRAMUSCULAR | Status: DC | PRN

## 2011-10-31 MED ORDER — SODIUM CHLORIDE 0.9 % IV SOLN
INTRAVENOUS | Status: DC
  Administered 2011-10-31: 19:00:00 via INTRAVENOUS
  Administered 2011-11-01: 1000 mL via INTRAVENOUS
  Administered 2011-11-01 – 2011-11-02 (×2): via INTRAVENOUS

## 2011-10-31 NOTE — Discharge Planning (Signed)
From [facility]

## 2011-10-31 NOTE — Discharge Planning (Signed)
ED CM completed consult/referral to unit SW -group home

## 2011-10-31 NOTE — H&P (Signed)
PCP:  No primary provider on file.   DOA:  10/31/2011  7:12 AM  Chief Complaint:  Coffee ground emesis  HPI: 45 year old male with history of schizoaffective disorder (from group home), dyslipidemia presents with complaints of coffee ground emesis that started the night prior to admission. Patient reports having about 20 episodes of vomiting with no blood in it, associated with periumbilical pain, 710 in intensity, non radiating, with no alleviating factors. Patient reports having similar episodes of intractable vomiting few months ago but has not sought medical care at that time as vomiting stopped on its own. Patient reports no diarrhea, no fever or chills,no reports of blood in stool or urine, no cough or shortness of breath, no chest pain.   Allergies: No Known Allergies  Prior to Admission medications   Medication Sig Start Date End Date Taking? Authorizing Provider  benztropine (COGENTIN) 1 MG tablet Take 1 mg by mouth at bedtime.    Yes Historical Provider, MD  cloZAPine (CLOZARIL) 100 MG tablet Take 100-400 mg by mouth 3 (three) times daily. 1 tablet by mouth two times daily and 4 tablets at bedtime    Yes Historical Provider, MD  docusate sodium (COLACE) 100 MG capsule Take 200 mg by mouth 2 (two) times daily.     Yes Historical Provider, MD  fluPHENAZine (PROLIXIN) 5 MG tablet Take 5-10 mg by mouth daily. Take 2 tablets by mouth twice daily and 1 tablet at bedtime    Yes Historical Provider, MD  LORazepam (ATIVAN) 0.5 MG tablet Take 0.5 mg by mouth 3 (three) times daily as needed. For anxiety    Yes Historical Provider, MD  pantoprazole (PROTONIX) 40 MG tablet Take 40 mg by mouth every morning.     Yes Historical Provider, MD  simvastatin (ZOCOR) 40 MG tablet Take 40 mg by mouth at bedtime.     Yes Historical Provider, MD  venlafaxine (EFFEXOR-XR) 150 MG 24 hr capsule Take 150 mg by mouth every morning.     Yes Historical Provider, MD  Vitamin D, Ergocalciferol, (DRISDOL) 50000 UNITS  CAPS Take 50,000 Units by mouth every 7 (seven) days. Wednesdays   Yes Historical Provider, MD  fexofenadine-pseudoephedrine (ALLEGRA-D 24) 180-240 MG per 24 hr tablet Take 1 tablet by mouth daily as needed. For allergies     Historical Provider, MD  fluticasone (FLONASE) 50 MCG/ACT nasal spray Place 2 sprays into the nose at bedtime as needed. For nasal congestion    Historical Provider, MD  meloxicam (MOBIC) 15 MG tablet Take 15 mg by mouth daily as needed. For pain.     Historical Provider, MD  methylcellulose (CITRUCEL) oral powder Take 1 packet by mouth 3 (three) times daily as needed. For constipation     Historical Provider, MD    Past Medical History  Diagnosis Date  . Hyperlipidemia   . Schizoaffective disorder   . H/O: GI bleed     Past Surgical History  Procedure Date  . Mole removal   . Wisdom tooth extraction     Social History:  reports that he has never smoked. He has never used smokeless tobacco. He reports that he does not drink alcohol or use illicit drugs.  No family history on file.  Review of Systems:  Constitutional: Denies fever, chills, diaphoresis, appetite change and fatigue.  HEENT: Denies photophobia, eye pain, redness, hearing loss, ear pain, congestion, sore throat, rhinorrhea, sneezing, mouth sores, trouble swallowing, neck pain, neck stiffness and tinnitus.   Respiratory: Denies SOB,  DOE, cough, chest tightness,  and wheezing.   Cardiovascular: Denies chest pain, palpitations and leg swelling.  Gastrointestinal:positive for nausea, vomiting, abdominal pain; no diarrhea, constipation, blood in stool or abdominal distention.  Genitourinary: Denies dysuria, urgency, frequency, hematuria, flank pain and difficulty urinating.  Musculoskeletal: Denies myalgias, back pain, joint swelling, arthralgias and gait problem.  Skin: Denies pallor, rash and wound.  Neurological: Denies dizziness, seizures, syncope, weakness, light-headedness, numbness and headaches.    Hematological: Denies adenopathy. Easy bruising, personal or family bleeding history  Psychiatric/Behavioral: Denies suicidal ideation, mood changes, confusion, nervousness, sleep disturbance and agitation   Physical Exam:  Filed Vitals:   10/31/11 0830 10/31/11 0900 10/31/11 0930 10/31/11 1000  BP: 114/76 117/77 109/75 116/79  Pulse: 112 114 115 113  Temp:      TempSrc:      Resp:      SpO2: 99% 98% 99% 98%    Constitutional: Vital signs reviewed.  Patient is a well-developed and well-nourished in no acute distress and cooperative with exam. Alert and oriented x3.  Head: Normocephalic and atraumatic Ear: TM normal bilaterally Mouth: no erythema or exudates, MMM Eyes: PERRL, EOMI, conjunctivae normal, No scleral icterus.  Neck: Supple, Trachea midline normal ROM, No JVD, mass, thyromegaly, or carotid bruit present.  Cardiovascular: RRR, S1 normal, S2 normal, no MRG, pulses symmetric and intact bilaterally Pulmonary/Chest: CTAB, no wheezes, rales, or rhonchi Abdominal: Soft. Tender diffusely to palpation, non-distended, bowel sounds are normal, no masses, organomegaly, or guarding present.  GU: no CVA tenderness Musculoskeletal: No joint deformities, erythema, or stiffness, ROM full and no nontender Ext: no edema and no cyanosis, pulses palpable bilaterally (DP and PT) Hematology: no cervical, inginal, or axillary adenopathy.  Neurological: A&O x3, Strenght is normal and symmetric bilaterally, cranial nerve II-XII are grossly intact, no focal motor deficit, sensory intact to light touch bilaterally.  Skin: Warm, dry and intact. No rash, cyanosis, or clubbing.  Psychiatric: Normal mood and affect. speech and behavior is normal. Judgment and thought content normal. Cognition and memory are normal.   Labs on Admission:  Results for orders placed during the hospital encounter of 10/31/11 (from the past 48 hour(s))  BASIC METABOLIC PANEL     Status: Abnormal   Collection Time    10/31/11  9:38 AM      Component Value Range Comment   Sodium 137  135 - 145 (mEq/L)    Potassium 4.2  3.5 - 5.1 (mEq/L)    Chloride 106  96 - 112 (mEq/L)    CO2 18 (*) 19 - 32 (mEq/L)    Glucose, Bld 131 (*) 70 - 99 (mg/dL)    BUN 39 (*) 6 - 23 (mg/dL)    Creatinine, Ser 1.61 (*) 0.50 - 1.35 (mg/dL)    Calcium 8.1 (*) 8.4 - 10.5 (mg/dL)    GFR calc non Af Amer 33 (*) >90 (mL/min)    GFR calc Af Amer 39 (*) >90 (mL/min)   CBC     Status: Abnormal   Collection Time   10/31/11  9:38 AM      Component Value Range Comment   WBC 6.0  4.0 - 10.5 (K/uL)    RBC 4.57  4.22 - 5.81 (MIL/uL)    Hemoglobin 13.9  13.0 - 17.0 (g/dL)    HCT 09.6  04.5 - 40.9 (%)    MCV 88.0  78.0 - 100.0 (fL)    MCH 30.4  26.0 - 34.0 (pg)    MCHC 34.6  30.0 -  36.0 (g/dL)    RDW 16.1  09.6 - 04.5 (%)    Platelets 128 (*) 150 - 400 (K/uL)   DIFFERENTIAL     Status: Abnormal   Collection Time   10/31/11  9:38 AM      Component Value Range Comment   Neutrophils Relative 73  43 - 77 (%)    Neutro Abs 4.4  1.7 - 7.7 (K/uL)    Lymphocytes Relative 8 (*) 12 - 46 (%)    Lymphs Abs 0.5 (*) 0.7 - 4.0 (K/uL)    Monocytes Relative 19 (*) 3 - 12 (%)    Monocytes Absolute 1.1 (*) 0.1 - 1.0 (K/uL)    Eosinophils Relative 0  0 - 5 (%)    Eosinophils Absolute 0.0  0.0 - 0.7 (K/uL)    Basophils Relative 0  0 - 1 (%)    Basophils Absolute 0.0  0.0 - 0.1 (K/uL)   LIPASE, BLOOD     Status: Abnormal   Collection Time   10/31/11  9:38 AM      Component Value Range Comment   Lipase 7 (*) 11 - 59 (U/L)   PROTIME-INR     Status: Normal   Collection Time   10/31/11  9:38 AM      Component Value Range Comment   Prothrombin Time 14.3  11.6 - 15.2 (seconds)    INR 1.09  0.00 - 1.49    TYPE AND SCREEN     Status: Normal   Collection Time   10/31/11  9:38 AM      Component Value Range Comment   ABO/RH(D) O NEG      Antibody Screen NEG      Sample Expiration 11/03/2011     ABO/RH     Status: Normal   Collection Time    10/31/11  9:45 AM      Component Value Range Comment   ABO/RH(D) O NEG       Radiological Exams on Admission: No results found.  Assessment/Plan Principal Problem:  *Upper GI bleed - patient is hemodynamically stable at present with BP 11679 with stable Hgb/Hct so no blood transfusion is warranted. Will start IV Protonix 40 mg BID, IV fluids/NS @ 125 cc/hr and will continue to monitor CBC to insure stable hemoglobin. Pt is started on IV Zofran for nausea or vomiting. The last reported episode of vomiting was about 12 hours ago. Will keep patient NPO and will advance diet tolerated.  Active Problems: Acute kidney injury - Cr is 2.25, likely secondary to dehydration; continue IV fluids/NS for now and will repeat BUN/Cr in am   Schizoaffective disorder -  Stable, will continue same home medication regimen   Dyslipidemia - stable, continue simvastatin  Diet - NPO and advance as tolerated  Education - patient is aware of plan of care and treatment  Advance directives - full code   Time Spent on Admission: 30 minutes  Nidya Bouyer 10/31/2011, 2:50 PM

## 2011-10-31 NOTE — Discharge Planning (Signed)
Pt confirms he considers [facility] as his PCP He has seen Dow Adolph but he reports he "believes she has retired" Pt agreed to an appointment at [facility] for d/c follow up

## 2011-10-31 NOTE — ED Notes (Signed)
Geralynn Rile at nursing home phone number [phone]

## 2011-10-31 NOTE — ED Notes (Signed)
[id number]<AV> Expected date:<BR> Expected time:<BR> Means of arrival:Ambulance<BR> Comments:<BR> Nausea and vomiting for 7 hours-GI bleed-coffee ground emesis

## 2011-10-31 NOTE — ED Provider Notes (Signed)
History     CSN: [id number] Arrival date & time: 10/31/2011  7:12 AM   First MD Initiated Contact with Patient 10/31/11 0725      Chief Complaint  Patient presents with  . GI Bleeding    (Consider location/radiation/quality/duration/timing/severity/associated sxs/prior treatment) Patient is a 45 y.o. male presenting with vomiting.  Emesis  This is a new problem. The current episode started 6 to 12 hours ago. The problem occurs 5 to 10 times per day. The problem has not changed since onset.Vomiting appearance: black color. There has been no fever. Pertinent negatives include no abdominal pain, no arthralgias, no chills, no cough, no diarrhea and no sweats. Risk factors: No sick contacts.   He had a similar episode 4 weeks ago. There has been no change in stool color. He feels weak today. He has not had syncope or near syncope.  Past Medical History  Diagnosis Date  . Hyperlipidemia   . Schizoaffective disorder   . H/O: GI bleed     Past Surgical History  Procedure Date  . Mole removal   . Wisdom tooth extraction     No family history on file.  History  Substance Use Topics  . Smoking status: Never Smoker   . Smokeless tobacco: Never Used  . Alcohol Use: No      Review of Systems  Constitutional: Negative for chills.  Respiratory: Negative for cough.   Gastrointestinal: Positive for vomiting. Negative for abdominal pain and diarrhea.  Musculoskeletal: Negative for arthralgias.  All other systems reviewed and are negative.    Allergies  Review of patient's allergies indicates no known allergies.  Home Medications   No current outpatient prescriptions on file.  BP 129/88  Pulse 121  Temp(Src) 99.7 F (37.6 C) (Oral)  Resp 16  Ht 6\' 3"  (1.905 m)  Wt 211 lb 10.3 oz (96 kg)  BMI 26.45 kg/m2  SpO2 95%  Physical Exam  Constitutional: He is oriented to person, place, and time. He appears well-developed and well-nourished.  HENT:  Head: Normocephalic and  atraumatic.  Eyes: Conjunctivae and EOM are normal. Pupils are equal, round, and reactive to light.       Mucosa pink  Neck: Normal range of motion. Neck supple.  Cardiovascular: Normal rate and regular rhythm.   Pulmonary/Chest: Effort normal.  Abdominal: Soft. Bowel sounds are normal. He exhibits no distension. There is no tenderness. There is no guarding.  Musculoskeletal: Normal range of motion.  Neurological: He is alert and oriented to person, place, and time. No cranial nerve deficit. He exhibits normal muscle tone. Coordination normal.  Skin: Skin is warm and dry.  Psychiatric: He has a normal mood and affect. His behavior is normal. Judgment and thought content normal.    ED Course  Procedures (including critical care time)  Labs Reviewed  BASIC METABOLIC PANEL - Abnormal; Notable for the following:    CO2 18 (*)    Glucose, Bld 131 (*)    BUN 39 (*)    Creatinine, Ser 2.25 (*)    Calcium 8.1 (*)    GFR calc non Af Amer 33 (*)    GFR calc Af Amer 39 (*)    All other components within normal limits  CBC - Abnormal; Notable for the following:    Platelets 128 (*)    All other components within normal limits  DIFFERENTIAL - Abnormal; Notable for the following:    Lymphocytes Relative 8 (*)    Lymphs Abs 0.5 (*)  Monocytes Relative 19 (*)    Monocytes Absolute 1.1 (*)    All other components within normal limits  LIPASE, BLOOD - Abnormal; Notable for the following:    Lipase 7 (*)    All other components within normal limits  CBC - Abnormal; Notable for the following:    Hemoglobin 12.8 (*)    HCT 37.7 (*)    Platelets 121 (*)    All other components within normal limits  COMPREHENSIVE METABOLIC PANEL - Abnormal; Notable for the following:    Glucose, Bld 119 (*)    BUN 39 (*)    Creatinine, Ser 2.19 (*)    Calcium 8.0 (*)    AST 388 (*)    ALT 77 (*)    GFR calc non Af Amer 35 (*)    GFR calc Af Amer 40 (*)    All other components within normal limits    DIFFERENTIAL - Abnormal; Notable for the following:    Lymphs Abs 0.6 (*)    Monocytes Relative 20 (*)    Monocytes Absolute 1.1 (*)    All other components within normal limits  PROTIME-INR  TYPE AND SCREEN  ABO/RH  MAGNESIUM  PHOSPHORUS  APTT  PROTIME-INR  BASIC METABOLIC PANEL  CBC   Dg Abd Acute W/chest  10/31/2011  *RADIOLOGY REPORT*  Clinical Data: Nausea and vomiting since last p.m.  ACUTE ABDOMEN SERIES (ABDOMEN 2 VIEW & CHEST 1 VIEW)  Comparison: 03/06/2005  Findings:  Grossly unchanged cardiac silhouette and mediastinal contours. There is persistent mild elevation of the right hemidiaphragm.  No new focal parenchymal opacities.  No pleural effusion or pneumothorax.  There is nonspecific moderate dilatation of several loops of small bowel within the left mid hemiabdomen with index loop of measuring approximate 5.5 cm in diameter.  This finding is associated with moderate to large colonic stool burden within the descending colon and air within the ascending colon.  No pneumoperitoneum, pneumatosis or portal venous gas.  No acute osseous abnormalities.  IMPRESSION: 1.  Nonspecific moderate dilatation of several loops of small bowel within the left mid abdomen without definite evidence of obstruction.  2.  Moderate to large colonic stool burden.  3.  No acute cardiopulmonary disease.  Original Report Authenticated By: Waynard Reeds, M.D.   Laboratory trending reveals doubling of creatinine, indicating renal failure  1. Vomiting   2. Renal insufficiency       MDM  Non-specific vomiting, purported to be black in color. He does have elevated BUN. He possibly has a gastrointestinal bleed. He has elevated renal and is likely dehydrated as well. He will need admitted for further diagnostic evaluation and stabilization.        Flint Melter, MD 10/31/11 2059

## 2011-10-31 NOTE — ED Notes (Signed)
Pt assisted living facility, presents with c/o GI bleed. Per EMS, pt vomiting coffee ground emesis and c/o pain in his abd. No distension or palpable masses noted per EMS. Pt is pale and cool to touch. Pt is tachycardic on monitor. Pt was given 4mg  Zofran in route via EMS.

## 2011-10-31 NOTE — ED Notes (Signed)
Patient transported to X-ray 

## 2011-11-01 ENCOUNTER — Encounter (HOSPITAL_COMMUNITY): Payer: Self-pay

## 2011-11-01 ENCOUNTER — Other Ambulatory Visit: Payer: Self-pay | Admitting: Gastroenterology

## 2011-11-01 ENCOUNTER — Encounter (HOSPITAL_COMMUNITY)
Admission: EM | Disposition: A | Discharge status: Home / Self Care | Payer: Self-pay | Source: Home / Self Care | Attending: Internal Medicine

## 2011-11-01 HISTORY — PX: ESOPHAGOGASTRODUODENOSCOPY: SHX5428

## 2011-11-01 LAB — CBC
Platelets: 108 10*3/uL — ABNORMAL LOW (ref 150–400)
RDW: 13 % (ref 11.5–15.5)
WBC: 4.6 10*3/uL (ref 4.0–10.5)

## 2011-11-01 LAB — BASIC METABOLIC PANEL
BUN: 30 mg/dL — ABNORMAL HIGH (ref 6–23)
Calcium: 7.9 mg/dL — ABNORMAL LOW (ref 8.4–10.5)
GFR calc Af Amer: 58 mL/min — ABNORMAL LOW (ref >90)
GFR calc non Af Amer: 50 mL/min — ABNORMAL LOW (ref >90)
Glucose, Bld: 117 mg/dL — ABNORMAL HIGH (ref 70–99)
Sodium: 141 mEq/L (ref 135–145)

## 2011-11-01 SURGERY — EGD (ESOPHAGOGASTRODUODENOSCOPY)
Anesthesia: Moderate Sedation

## 2011-11-01 MED ORDER — MIDAZOLAM HCL 10 MG/2ML IJ SOLN
INTRAMUSCULAR | Status: DC | PRN
  Administered 2011-11-01 (×2): 2 mg via INTRAVENOUS

## 2011-11-01 MED ORDER — FENTANYL NICU IV SYRINGE 50 MCG/ML
INJECTION | INTRAMUSCULAR | Status: DC | PRN
  Administered 2011-11-01 (×2): 25 ug via INTRAVENOUS

## 2011-11-01 NOTE — Progress Notes (Signed)
Subjective: Patient seen and examined ,denies any more vomiting,abdominal pain improved .He was supposed to be NPO ,however stated that he has been drinking water.  Objective: Vital signs in last 24 hours: Temp:  [98.2 F (36.8 C)-99.7 F (37.6 C)] 98.2 F (36.8 C) (11/30 0629) Pulse Rate:  [108-121] 108  (11/30 0629) Resp:  [18-20] 20  (11/30 0629) BP: (108-131)/(74-88) 119/81 mmHg (11/30 0629) SpO2:  [95 %-99 %] 98 % (11/30 0629) Weight:  [96 kg (211 lb 10.3 oz)] 211 lb 10.3 oz (96 kg) (11/29 1810) Weight change:  Last BM Date: 10/30/11  Intake/Output from previous day: 11/29 0701 - 11/30 0700 In: 1500 [I.V.:1500] Out: 2200 [Urine:2200]     Physical Exam: General: Alert, awake,in no acute distress. Heart: Regular rate and rhythm, without murmurs, rubs, gallops. Lungs: Clear to auscultation bilaterally. Abdomen: Soft, nontender, nondistended, positive bowel sounds. Extremities: No clubbing cyanosis or edema with positive pedal pulses.     Lab Results: Results for orders placed during the hospital encounter of 10/31/11 (from the past 24 hour(s))  BASIC METABOLIC PANEL     Status: Abnormal   Collection Time   10/31/11  9:38 AM      Component Value Range   Sodium 137  135 - 145 (mEq/L)   Potassium 4.2  3.5 - 5.1 (mEq/L)   Chloride 106  96 - 112 (mEq/L)   CO2 18 (*) 19 - 32 (mEq/L)   Glucose, Bld 131 (*) 70 - 99 (mg/dL)   BUN 39 (*) 6 - 23 (mg/dL)   Creatinine, Ser 7.84 (*) 0.50 - 1.35 (mg/dL)   Calcium 8.1 (*) 8.4 - 10.5 (mg/dL)   GFR calc non Af Amer 33 (*) >90 (mL/min)   GFR calc Af Amer 39 (*) >90 (mL/min)  CBC     Status: Abnormal   Collection Time   10/31/11  9:38 AM      Component Value Range   WBC 6.0  4.0 - 10.5 (K/uL)   RBC 4.57  4.22 - 5.81 (MIL/uL)   Hemoglobin 13.9  13.0 - 17.0 (g/dL)   HCT 69.6  29.5 - 28.4 (%)   MCV 88.0  78.0 - 100.0 (fL)   MCH 30.4  26.0 - 34.0 (pg)   MCHC 34.6  30.0 - 36.0 (g/dL)   RDW 13.2  44.0 - 10.2 (%)   Platelets 128  (*) 150 - 400 (K/uL)  DIFFERENTIAL     Status: Abnormal   Collection Time   10/31/11  9:38 AM      Component Value Range   Neutrophils Relative 73  43 - 77 (%)   Neutro Abs 4.4  1.7 - 7.7 (K/uL)   Lymphocytes Relative 8 (*) 12 - 46 (%)   Lymphs Abs 0.5 (*) 0.7 - 4.0 (K/uL)   Monocytes Relative 19 (*) 3 - 12 (%)   Monocytes Absolute 1.1 (*) 0.1 - 1.0 (K/uL)   Eosinophils Relative 0  0 - 5 (%)   Eosinophils Absolute 0.0  0.0 - 0.7 (K/uL)   Basophils Relative 0  0 - 1 (%)   Basophils Absolute 0.0  0.0 - 0.1 (K/uL)  LIPASE, BLOOD     Status: Abnormal   Collection Time   10/31/11  9:38 AM      Component Value Range   Lipase 7 (*) 11 - 59 (U/L)  PROTIME-INR     Status: Normal   Collection Time   10/31/11  9:38 AM      Component Value Range  Prothrombin Time 14.3  11.6 - 15.2 (seconds)   INR 1.09  0.00 - 1.49   TYPE AND SCREEN     Status: Normal   Collection Time   10/31/11  9:38 AM      Component Value Range   ABO/RH(D) O NEG     Antibody Screen NEG     Sample Expiration 11/03/2011    ABO/RH     Status: Normal   Collection Time   10/31/11  9:45 AM      Component Value Range   ABO/RH(D) O NEG    CBC     Status: Abnormal   Collection Time   10/31/11  6:33 PM      Component Value Range   WBC 5.5  4.0 - 10.5 (K/uL)   RBC 4.32  4.22 - 5.81 (MIL/uL)   Hemoglobin 12.8 (*) 13.0 - 17.0 (g/dL)   HCT 16.1 (*) 09.6 - 52.0 (%)   MCV 87.3  78.0 - 100.0 (fL)   MCH 29.6  26.0 - 34.0 (pg)   MCHC 34.0  30.0 - 36.0 (g/dL)   RDW 04.5  40.9 - 81.1 (%)   Platelets 121 (*) 150 - 400 (K/uL)  COMPREHENSIVE METABOLIC PANEL     Status: Abnormal   Collection Time   10/31/11  6:33 PM      Component Value Range   Sodium 142  135 - 145 (mEq/L)   Potassium 4.0  3.5 - 5.1 (mEq/L)   Chloride 109  96 - 112 (mEq/L)   CO2 21  19 - 32 (mEq/L)   Glucose, Bld 119 (*) 70 - 99 (mg/dL)   BUN 39 (*) 6 - 23 (mg/dL)   Creatinine, Ser 9.14 (*) 0.50 - 1.35 (mg/dL)   Calcium 8.0 (*) 8.4 - 10.5 (mg/dL)    Total Protein 6.6  6.0 - 8.3 (g/dL)   Albumin 3.6  3.5 - 5.2 (g/dL)   AST 782 (*) 0 - 37 (U/L)   ALT 77 (*) 0 - 53 (U/L)   Alkaline Phosphatase 79  39 - 117 (U/L)   Total Bilirubin 0.5  0.3 - 1.2 (mg/dL)   GFR calc non Af Amer 35 (*) >90 (mL/min)   GFR calc Af Amer 40 (*) >90 (mL/min)  MAGNESIUM     Status: Normal   Collection Time   10/31/11  6:33 PM      Component Value Range   Magnesium 2.3  1.5 - 2.5 (mg/dL)  PHOSPHORUS     Status: Normal   Collection Time   10/31/11  6:33 PM      Component Value Range   Phosphorus 3.7  2.3 - 4.6 (mg/dL)  DIFFERENTIAL     Status: Abnormal   Collection Time   10/31/11  6:33 PM      Component Value Range   Neutrophils Relative 68  43 - 77 (%)   Neutro Abs 3.8  1.7 - 7.7 (K/uL)   Lymphocytes Relative 12  12 - 46 (%)   Lymphs Abs 0.6 (*) 0.7 - 4.0 (K/uL)   Monocytes Relative 20 (*) 3 - 12 (%)   Monocytes Absolute 1.1 (*) 0.1 - 1.0 (K/uL)   Eosinophils Relative 0  0 - 5 (%)   Eosinophils Absolute 0.0  0.0 - 0.7 (K/uL)   Basophils Relative 0  0 - 1 (%)   Basophils Absolute 0.0  0.0 - 0.1 (K/uL)  APTT     Status: Normal   Collection Time   10/31/11  6:33  PM      Component Value Range   aPTT 29  24 - 37 (seconds)  PROTIME-INR     Status: Normal   Collection Time   10/31/11  6:33 PM      Component Value Range   Prothrombin Time 14.9  11.6 - 15.2 (seconds)   INR 1.15  0.00 - 1.49   BASIC METABOLIC PANEL     Status: Abnormal   Collection Time   11/01/11  5:28 AM      Component Value Range   Sodium 141  135 - 145 (mEq/L)   Potassium 3.8  3.5 - 5.1 (mEq/L)   Chloride 111  96 - 112 (mEq/L)   CO2 20  19 - 32 (mEq/L)   Glucose, Bld 117 (*) 70 - 99 (mg/dL)   BUN 30 (*) 6 - 23 (mg/dL)   Creatinine, Ser 1.61 (*) 0.50 - 1.35 (mg/dL)   Calcium 7.9 (*) 8.4 - 10.5 (mg/dL)   GFR calc non Af Amer 50 (*) >90 (mL/min)   GFR calc Af Amer 58 (*) >90 (mL/min)  CBC     Status: Abnormal   Collection Time   11/01/11  5:28 AM      Component Value Range     WBC 4.6  4.0 - 10.5 (K/uL)   RBC 4.02 (*) 4.22 - 5.81 (MIL/uL)   Hemoglobin 12.2 (*) 13.0 - 17.0 (g/dL)   HCT 09.6 (*) 04.5 - 52.0 (%)   MCV 87.8  78.0 - 100.0 (fL)   MCH 30.3  26.0 - 34.0 (pg)   MCHC 34.6  30.0 - 36.0 (g/dL)   RDW 40.9  81.1 - 91.4 (%)   Platelets 108 (*) 150 - 400 (K/uL)    Studies/Results: Dg Abd Acute W/chest  10/31/2011  *RADIOLOGY REPORT*  Clinical Data: Nausea and vomiting since last p.m.  ACUTE .  IMPRESSION: 1.  Nonspecific moderate dilatation of several loops of small bowel within the left mid abdomen without definite evidence of obstruction.  2.  Moderate to large colonic stool burden.  3.  No acute cardiopulmonary disease.  Original Report Authenticated By: Waynard Reeds, M.D.    Medications:    . sodium chloride   Intravenous STAT  . benztropine  1 mg Oral QHS  . cloZAPine  100 mg Oral BID AC  . cloZAPine  400 mg Oral QHS  . docusate sodium  200 mg Oral BID  . fluPHENAZine  5-10 mg Oral Daily  . loratadine  10 mg Oral Daily  . pantoprazole (PROTONIX) IV  40 mg Intravenous Q12H  . simvastatin  40 mg Oral QHS  . sodium chloride  500 mL Intravenous Once  . venlafaxine  150 mg Oral Q0700  . Vitamin D (Ergocalciferol)  50,000 Units Oral Q7 days  . DISCONTD: Vitamin D (Ergocalciferol)  50,000 Units Oral Q7 days    fluticasone, LORazepam, ondansetron (ZOFRAN) IV, ondansetron     . sodium chloride 125 mL/hr at 10/31/11 1835  . DISCONTD: sodium chloride 999 mL/hr at 10/31/11 0746    Assessment/Plan:  Principal Problem:  *Upper GI bleed Stable H/H ,continue protonix ,keep NPO  GI consult (Dr Elnoria Howard) Active Problems:  Schizoaffective disorder Continue meds  Acute kidney injury mostlikley prerenal azotemia,improving with IVF   Dyslipidemia On statins    LOS: 1 day   Nylen Creque 11/01/2011, 8:05 AM

## 2011-11-01 NOTE — H&P (View-Only) (Signed)
Psychosocial assessment completed and placed in shadow chart.   Patient is from [facility]. FL-2 completed with the exception of discharge meds. Group home confirms that patient is fine to return upon d/c. T/c to pts mother, martha, [phone], she also confirms patient is to return to group home. CSW could not meet with patient as patient was sleeping. CSW to follow for discharge.  Jonee Lamore C. Athanasia Stanwood MSW, LCSW [phone]  

## 2011-11-01 NOTE — Interval H&P Note (Signed)
History and Physical Interval Note:  11/01/2011 4:35 PM  Brady Hoffman  has presented today for surgery, with the diagnosis of Coffee Ground Emesis  The various methods of treatment have been discussed with the patient and family. After consideration of risks, benefits and other options for treatment, the patient has consented to  Procedure(s): ESOPHAGOGASTRODUODENOSCOPY (EGD) as a surgical intervention .  The patients' history has been reviewed, patient examined, no change in status, stable for surgery.  I have reviewed the patients' chart and labs.  Questions were answered to the patient's satisfaction.     Cardarius Senat D

## 2011-11-01 NOTE — Progress Notes (Signed)
UR CHART REVIEWED 

## 2011-11-01 NOTE — Consult Note (Signed)
Reason for Consult:Coffee Ground Emesis Referring Physician: [facility] - Unassigned patient  Brady Hoffman HPI: This is a 45 year old gentleman with Schizoaffective disorder admitted from a group home after complaining of coffee ground emesis.  He started to have nausea and vomiting and then last evening he vomited coffee grounds.  He denies having this issue in the past, but he did have some of the symptoms of nausea and vomiting previously.  The patient reports that this episode was severe.  Per his report he had a syncopal episode, but this cannot be confirmed.  There is a history of GERD and he takes medication for his GERD.  Past Medical History  Diagnosis Date  . Hyperlipidemia   . Schizoaffective disorder   . H/O: GI bleed     Past Surgical History  Procedure Date  . Mole removal   . Wisdom tooth extraction     History reviewed. No pertinent family history.  Social History:  reports that he has never smoked. He has never used smokeless tobacco. He reports that he does not drink alcohol or use illicit drugs.  Allergies: No Known Allergies  Medications:  Scheduled:   . sodium chloride   Intravenous STAT  . benztropine  1 mg Oral QHS  . cloZAPine  100 mg Oral BID AC  . cloZAPine  400 mg Oral QHS  . docusate sodium  200 mg Oral BID  . fluPHENAZine  5-10 mg Oral Daily  . loratadine  10 mg Oral Daily  . pantoprazole (PROTONIX) IV  40 mg Intravenous Q12H  . simvastatin  40 mg Oral QHS  . venlafaxine  150 mg Oral Q0700  . Vitamin D (Ergocalciferol)  50,000 Units Oral Q7 days  . DISCONTD: Vitamin D (Ergocalciferol)  50,000 Units Oral Q7 days   Continuous:   . sodium chloride 100 mL/hr at 11/01/11 0900    Results for orders placed during the hospital encounter of 10/31/11 (from the past 24 hour(s))  CBC     Status: Abnormal   Collection Time   10/31/11  6:33 PM      Component Value Range   WBC 5.5  4.0 - 10.5 (K/uL)   RBC 4.32  4.22 - 5.81 (MIL/uL)   Hemoglobin 12.8 (*) 13.0 - 17.0 (g/dL)   HCT 16.1 (*) 09.6 - 52.0 (%)   MCV 87.3  78.0 - 100.0 (fL)   MCH 29.6  26.0 - 34.0 (pg)   MCHC 34.0  30.0 - 36.0 (g/dL)   RDW 04.5  40.9 - 81.1 (%)   Platelets 121 (*) 150 - 400 (K/uL)  COMPREHENSIVE METABOLIC PANEL     Status: Abnormal   Collection Time   10/31/11  6:33 PM      Component Value Range   Sodium 142  135 - 145 (mEq/L)   Potassium 4.0  3.5 - 5.1 (mEq/L)   Chloride 109  96 - 112 (mEq/L)   CO2 21  19 - 32 (mEq/L)   Glucose, Bld 119 (*) 70 - 99 (mg/dL)   BUN 39 (*) 6 - 23 (mg/dL)   Creatinine, Ser 9.14 (*) 0.50 - 1.35 (mg/dL)   Calcium 8.0 (*) 8.4 - 10.5 (mg/dL)   Total Protein 6.6  6.0 - 8.3 (g/dL)   Albumin 3.6  3.5 - 5.2 (g/dL)   AST 782 (*) 0 - 37 (U/L)   ALT 77 (*) 0 - 53 (U/L)   Alkaline Phosphatase 79  39 - 117 (U/L)   Total Bilirubin 0.5  0.3 - 1.2 (mg/dL)   GFR calc non Af Amer 35 (*) >90 (mL/min)   GFR calc Af Amer 40 (*) >90 (mL/min)  MAGNESIUM     Status: Normal   Collection Time   10/31/11  6:33 PM      Component Value Range   Magnesium 2.3  1.5 - 2.5 (mg/dL)  PHOSPHORUS     Status: Normal   Collection Time   10/31/11  6:33 PM      Component Value Range   Phosphorus 3.7  2.3 - 4.6 (mg/dL)  DIFFERENTIAL     Status: Abnormal   Collection Time   10/31/11  6:33 PM      Component Value Range   Neutrophils Relative 68  43 - 77 (%)   Neutro Abs 3.8  1.7 - 7.7 (K/uL)   Lymphocytes Relative 12  12 - 46 (%)   Lymphs Abs 0.6 (*) 0.7 - 4.0 (K/uL)   Monocytes Relative 20 (*) 3 - 12 (%)   Monocytes Absolute 1.1 (*) 0.1 - 1.0 (K/uL)   Eosinophils Relative 0  0 - 5 (%)   Eosinophils Absolute 0.0  0.0 - 0.7 (K/uL)   Basophils Relative 0  0 - 1 (%)   Basophils Absolute 0.0  0.0 - 0.1 (K/uL)  APTT     Status: Normal   Collection Time   10/31/11  6:33 PM      Component Value Range   aPTT 29  24 - 37 (seconds)  PROTIME-INR     Status: Normal   Collection Time   10/31/11  6:33 PM      Component Value Range    Prothrombin Time 14.9  11.6 - 15.2 (seconds)   INR 1.15  0.00 - 1.49   BASIC METABOLIC PANEL     Status: Abnormal   Collection Time   11/01/11  5:28 AM      Component Value Range   Sodium 141  135 - 145 (mEq/L)   Potassium 3.8  3.5 - 5.1 (mEq/L)   Chloride 111  96 - 112 (mEq/L)   CO2 20  19 - 32 (mEq/L)   Glucose, Bld 117 (*) 70 - 99 (mg/dL)   BUN 30 (*) 6 - 23 (mg/dL)   Creatinine, Ser 5.40 (*) 0.50 - 1.35 (mg/dL)   Calcium 7.9 (*) 8.4 - 10.5 (mg/dL)   GFR calc non Af Amer 50 (*) >90 (mL/min)   GFR calc Af Amer 58 (*) >90 (mL/min)  CBC     Status: Abnormal   Collection Time   11/01/11  5:28 AM      Component Value Range   WBC 4.6  4.0 - 10.5 (K/uL)   RBC 4.02 (*) 4.22 - 5.81 (MIL/uL)   Hemoglobin 12.2 (*) 13.0 - 17.0 (g/dL)   HCT 98.1 (*) 19.1 - 52.0 (%)   MCV 87.8  78.0 - 100.0 (fL)   MCH 30.3  26.0 - 34.0 (pg)   MCHC 34.6  30.0 - 36.0 (g/dL)   RDW 47.8  29.5 - 62.1 (%)   Platelets 108 (*) 150 - 400 (K/uL)     Dg Abd Acute W/chest  10/31/2011  *RADIOLOGY REPORT*  Clinical Data: Nausea and vomiting since last p.m.  ACUTE ABDOMEN SERIES (ABDOMEN 2 VIEW & CHEST 1 VIEW)  Comparison: 03/06/2005  Findings:  Grossly unchanged cardiac silhouette and mediastinal contours. There is persistent mild elevation of the right hemidiaphragm.  No new focal parenchymal opacities.  No pleural effusion or pneumothorax.  There is nonspecific moderate dilatation of several loops of small bowel within the left mid hemiabdomen with index loop of measuring approximate 5.5 cm in diameter.  This finding is associated with moderate to large colonic stool burden within the descending colon and air within the ascending colon.  No pneumoperitoneum, pneumatosis or portal venous gas.  No acute osseous abnormalities.  IMPRESSION: 1.  Nonspecific moderate dilatation of several loops of small bowel within the left mid abdomen without definite evidence of obstruction.  2.  Moderate to large colonic stool burden.  3.   No acute cardiopulmonary disease.  Original Report Authenticated By: Waynard Reeds, M.D.    ROS:  As stated above in the HPI otherwise negative.  Blood pressure 130/79, pulse 103, temperature 98.6 F (37 C), temperature source Oral, resp. rate 11, height 6\' 3"  (1.905 m), weight 96 kg (211 lb 10.3 oz), SpO2 98.00%.    PE: Gen: NAD, Alert and Oriented HEENT:  [place]/AT, EOMI Neck: Supple, no LAD Lungs: CTA Bilaterally CV: RRR without M/G/R ABM: Soft, NTND, +BS Ext: No C/C/E  Assessment/Plan: 1) Coffee ground emesis 2) Mild mid abdominal pain   With his current symptoms an EGD will be performed.  His HGB is stable at this time.  Plan: 1) EGD.  Further recommendations pending the findings.  Janei Scheff D 11/01/2011, 4:30 PM

## 2011-11-01 NOTE — Progress Notes (Signed)
Psychosocial assessment completed and placed in shadow chart.   Patient is from [facility]. FL-2 completed with the exception of discharge meds. Group home confirms that patient is fine to return upon d/c. T/c to pts mother, [name], [phone], she also confirms patient is to return to group home. CSW could not meet with patient as patient was sleeping. CSW to follow for discharge.  Severina Sykora C. Antonyo Hinderer MSW, LCSW [phone]

## 2011-11-02 DIAGNOSIS — K5900 Constipation, unspecified: Secondary | ICD-10-CM | POA: Diagnosis present

## 2011-11-02 LAB — CBC
Hemoglobin: 11.2 g/dL — ABNORMAL LOW (ref 13.0–17.0)
MCHC: 33.7 g/dL (ref 30.0–36.0)
RBC: 3.73 MIL/uL — ABNORMAL LOW (ref 4.22–5.81)
WBC: 4.5 10*3/uL (ref 4.0–10.5)

## 2011-11-02 LAB — BASIC METABOLIC PANEL
CO2: 24 mEq/L (ref 19–32)
Chloride: 112 mEq/L (ref 96–112)
GFR calc non Af Amer: 80 mL/min — ABNORMAL LOW (ref >90)
Glucose, Bld: 115 mg/dL — ABNORMAL HIGH (ref 70–99)
Potassium: 3.7 mEq/L (ref 3.5–5.1)
Sodium: 142 mEq/L (ref 135–145)

## 2011-11-02 MED ORDER — PANTOPRAZOLE SODIUM 40 MG PO TBEC: 40.0000 mg | DELAYED_RELEASE_TABLET | Freq: Two times a day (BID) | ORAL | Status: DC

## 2011-11-02 MED ORDER — SENNA 8.6 MG PO CAPS: 2.0000 | ORAL_CAPSULE | Freq: Every evening | ORAL | Status: DC | PRN

## 2011-11-02 NOTE — Progress Notes (Signed)
  Patient ID: Brady Hoffman, male   DOB: [birth year], 45 y.o.   MRN: [id number]  Asked to verify dx found at EGD per Dr. Elnoria Howard yesterday No report was put into EPIC system. Report has been reviewed- EGD showed Grade A esophagitis, and a 1 cm  gastric polyp which was snared and removed.  BX will be reviewed. Recommended PPI rx twice daily x one month,then once daily therafter.    Pt is stable for discharge from GI standpoint.

## 2011-11-02 NOTE — Discharge Summary (Signed)
Patient ID: Brady Hoffman MRN: [id number] DOB/AGE: June 17, 1966 45 y.o.  Admit date: 10/31/2011 Discharge date: 11/02/2011  Primary Care Physician:  No primary provider on file.   Discharge Diagnoses:    Present on Admission:  .Upper GI bleed *Esophagitis *Abdominal pain  *Constipation  .Schizoaffective disorder .Acute kidney injury .Dyslipidemia  Current Discharge Medication List  START taking these medications  Sennosides (SENNA) 8.6 MG CAPS Take 2 capsules by mouth at bedtime as needed. Qty: 60 each, Refills: 0   CONTINUE these medications which have NOT CHANGED   benztropine (COGENTIN) 1 MG table.Take 1 mg by mouth at bedtime.   cloZAPine (CLOZARIL) 100 MG tablet.Take 100-400 mg by mouth 3 (three) times daily. 1 tablet by mouth two times daily and 4 tablets at bedtime   docusate sodium (COLACE) 100 MG capsule.Take 200 mg by mouth 2 (two) times daily.    fluPHENAZine (PROLIXIN) 5 MG tablet.Take 5-10 mg by mouth daily. Take 2 tablets by mouth twice daily and 1 tablet at bedtime   LORazepam (ATIVAN) 0.5 MG tablet .Take 0.5 mg by mouth 3 (three) times daily as needed. For anxiety   pantoprazole (PROTONIX) 40 MG tablet.Take 40 mg by mouth twice daily for 1 month then once daily    simvastatin (ZOCOR) 40 MG tablet.Take 40 mg by mouth at bedtime.    venlafaxine (EFFEXOR-XR) 150 MG 24 hr capsule. Take 150 mg by mouth every morning.    Vitamin D, Ergocalciferol, (DRISDOL) 50000 UNITS CAPS Take 50,000 Units by mouth every 7 (seven) days. Wednesdays  fexofenadine-pseudoephedrine (ALLEGRA-D 24) 180-240 MG per 24 hr tablet Take 1 tablet by mouth daily as needed. For allergies   fluticasone (FLONASE) 50 MCG/ACT nasal spray Place 2 sprays into the nose at bedtime as needed. For nasal congestion  methylcellulose (CITRUCEL) oral powder.Take 1 packet by mouth 3 (three) times daily as needed. For constipation    STOP taking these medications meloxicam (MOBIC) 15 MG  tablet    Consults:   GI-Dr Brady Hoffman   Significant Diagnostic Studies:  Dg Abd Acute W/chest  10/31/2011  *RADIOLOGY REPORT*  Clinical Data: Nausea and vomiting since last p.m.  ACUTE   IMPRESSION: 1.  Nonspecific moderate dilatation of several loops of small bowel within the left mid abdomen without definite evidence of obstruction.  2.  Moderate to large colonic stool burden.  3.  No acute cardiopulmonary disease.  Original Report Authenticated By: Waynard Reeds, M.D.    Brief H and P: For complete details please refer to admission H and P, but in brief  45 year old male with history of schizoaffective disorder (from group home), dyslipidemia presents with complaints of coffee ground emesis that started the night prior to admission. Patient reports having about 20 episodes of vomiting with no blood in it, associated with periumbilical pain, 710 in intensity, non radiating, with no alleviating factors. Patient reports having similar episodes of intractable vomiting few months ago but has not sought medical care at that time as vomiting stopped on its own. Patient reports no diarrhea, no fever or chills,no reports of blood in stool or urine, no cough or shortness of breath, no chest pain.    Hospital Course:  Principal Problem:  *Upper GI bleed  Stable H/H.Patient was kept and PO and started on IVprotonix .GI was consulted (Dr Brady Hoffman) ,patient underwent EGD yesterday that showed grade A esophagitis,1cm gastric polyp was biopsed ,path  Pending to follow as outpatient .Gastroenterology recommendation is to  Continue protonix  Twice daily  for 1 month then once daily . I will discontinue MOBIC ,avoid NSAIDS. Abdominal pain : Resolved ,probably secondary to constipation ,patient had a bowel moment today ,continue laxatives. Active Problems:  Schizoaffective disorder  Continue meds  Acute kidney injury  mostlikley prerenal azotemia,improved with IVF ,creatinine trended down for 2.25 on admission  to 1.09 today. Dyslipidemia  Continue  statins  Subjective Patient seen and examined ,feeling better ,tolerating solid diet,had a BM this AM .  Filed Vitals:   11/02/11 0641  BP: 129/72  Pulse: 109  Temp: 98.8 F (37.1 C)  Resp: 18    General: Alert, awake, in no acute distress. Heart: Regular rate and rhythm, without murmurs, rubs, gallops. Lungs: Clear to auscultation bilaterally. Abdomen: Soft, nontender, nondistended, positive bowel sounds. Extremities: No  edema with positive pedal pulses. Neuro: Grossly intact, nonfocal.   Disposition and Follow-up:  Back to Group Home  Follow with PCP in 1 week  Follow with Dr Brady Hoffman ,call to make an appoinment  Time spent on Discharge: 45 minutes   Signed: Ziomara Birenbaum 11/02/2011, 11:33 AM

## 2011-11-02 NOTE — Progress Notes (Signed)
Patient discharged to group home via car with mother.

## 2011-11-06 ENCOUNTER — Encounter (HOSPITAL_COMMUNITY): Payer: Self-pay | Admitting: Gastroenterology

## 2014-01-02 ENCOUNTER — Emergency Department (HOSPITAL_COMMUNITY): Payer: Medicare Other

## 2014-01-02 ENCOUNTER — Inpatient Hospital Stay (HOSPITAL_COMMUNITY): Payer: Medicare Other

## 2014-01-02 ENCOUNTER — Encounter (HOSPITAL_COMMUNITY): Payer: Self-pay | Admitting: Emergency Medicine

## 2014-01-02 ENCOUNTER — Inpatient Hospital Stay (HOSPITAL_COMMUNITY)
Admission: EM | Admit: 2014-01-02 | Discharge: 2014-01-17 | DRG: 100 | Disposition: A | Discharge status: Rehabilitation Facility | Payer: Medicare Other | Attending: Internal Medicine | Admitting: Internal Medicine

## 2014-01-02 DIAGNOSIS — I4892 Unspecified atrial flutter: Secondary | ICD-10-CM | POA: Diagnosis present

## 2014-01-02 DIAGNOSIS — F3289 Other specified depressive episodes: Secondary | ICD-10-CM | POA: Diagnosis present

## 2014-01-02 DIAGNOSIS — I498 Other specified cardiac arrhythmias: Secondary | ICD-10-CM | POA: Diagnosis not present

## 2014-01-02 DIAGNOSIS — E869 Volume depletion, unspecified: Secondary | ICD-10-CM | POA: Diagnosis present

## 2014-01-02 DIAGNOSIS — E876 Hypokalemia: Secondary | ICD-10-CM | POA: Diagnosis not present

## 2014-01-02 DIAGNOSIS — N179 Acute kidney failure, unspecified: Secondary | ICD-10-CM | POA: Diagnosis present

## 2014-01-02 DIAGNOSIS — I129 Hypertensive chronic kidney disease with stage 1 through stage 4 chronic kidney disease, or unspecified chronic kidney disease: Secondary | ICD-10-CM | POA: Diagnosis present

## 2014-01-02 DIAGNOSIS — E875 Hyperkalemia: Secondary | ICD-10-CM | POA: Diagnosis present

## 2014-01-02 DIAGNOSIS — W19XXXA Unspecified fall, initial encounter: Secondary | ICD-10-CM | POA: Diagnosis present

## 2014-01-02 DIAGNOSIS — R4182 Altered mental status, unspecified: Secondary | ICD-10-CM

## 2014-01-02 DIAGNOSIS — E872 Acidosis, unspecified: Secondary | ICD-10-CM

## 2014-01-02 DIAGNOSIS — S0990XA Unspecified injury of head, initial encounter: Secondary | ICD-10-CM | POA: Diagnosis present

## 2014-01-02 DIAGNOSIS — G9341 Metabolic encephalopathy: Secondary | ICD-10-CM | POA: Diagnosis present

## 2014-01-02 DIAGNOSIS — K219 Gastro-esophageal reflux disease without esophagitis: Secondary | ICD-10-CM | POA: Diagnosis present

## 2014-01-02 DIAGNOSIS — G40901 Epilepsy, unspecified, not intractable, with status epilepticus: Secondary | ICD-10-CM | POA: Diagnosis present

## 2014-01-02 DIAGNOSIS — J690 Pneumonitis due to inhalation of food and vomit: Secondary | ICD-10-CM | POA: Diagnosis present

## 2014-01-02 DIAGNOSIS — G40401 Other generalized epilepsy and epileptic syndromes, not intractable, with status epilepticus: Secondary | ICD-10-CM

## 2014-01-02 DIAGNOSIS — E785 Hyperlipidemia, unspecified: Secondary | ICD-10-CM | POA: Diagnosis present

## 2014-01-02 DIAGNOSIS — N189 Chronic kidney disease, unspecified: Secondary | ICD-10-CM | POA: Diagnosis present

## 2014-01-02 DIAGNOSIS — D649 Anemia, unspecified: Secondary | ICD-10-CM | POA: Diagnosis present

## 2014-01-02 DIAGNOSIS — I4891 Unspecified atrial fibrillation: Secondary | ICD-10-CM | POA: Diagnosis present

## 2014-01-02 DIAGNOSIS — Z79899 Other long term (current) drug therapy: Secondary | ICD-10-CM

## 2014-01-02 DIAGNOSIS — F329 Major depressive disorder, single episode, unspecified: Secondary | ICD-10-CM | POA: Diagnosis present

## 2014-01-02 DIAGNOSIS — J9600 Acute respiratory failure, unspecified whether with hypoxia or hypercapnia: Secondary | ICD-10-CM

## 2014-01-02 DIAGNOSIS — Z781 Physical restraint status: Secondary | ICD-10-CM | POA: Diagnosis present

## 2014-01-02 DIAGNOSIS — E870 Hyperosmolality and hypernatremia: Secondary | ICD-10-CM | POA: Diagnosis present

## 2014-01-02 DIAGNOSIS — F259 Schizoaffective disorder, unspecified: Secondary | ICD-10-CM | POA: Diagnosis present

## 2014-01-02 HISTORY — DX: Chronic kidney disease, unspecified: N18.9

## 2014-01-02 HISTORY — DX: Major depressive disorder, single episode, unspecified: F32.9

## 2014-01-02 HISTORY — DX: Anemia, unspecified: D64.9

## 2014-01-02 HISTORY — DX: Depression, unspecified: F32.A

## 2014-01-02 HISTORY — DX: Unspecified convulsions: R56.9

## 2014-01-02 LAB — POCT I-STAT 3, ART BLOOD GAS (G3+)
ACID-BASE DEFICIT: 15 mmol/L — AB (ref 0.0–2.0)
BICARBONATE: 12.8 meq/L — AB (ref 20.0–24.0)
O2 Saturation: 100 %
PO2 ART: 476 mmHg — AB (ref 80.0–100.0)
TCO2: 14 mmol/L (ref 0–100)
pCO2 arterial: 34.3 mmHg — ABNORMAL LOW (ref 35.0–45.0)
pH, Arterial: 7.179 — CL (ref 7.350–7.450)

## 2014-01-02 LAB — CREATININE, URINE, RANDOM: Creatinine, Urine: 65.66 mg/dL

## 2014-01-02 LAB — ACETAMINOPHEN LEVEL: Acetaminophen (Tylenol), Serum: <15 ug/mL (ref 10–30)

## 2014-01-02 LAB — GLUCOSE, CAPILLARY
GLUCOSE-CAPILLARY: 191 mg/dL — AB (ref 70–99)
GLUCOSE-CAPILLARY: 147 mg/dL — AB (ref 70–99)

## 2014-01-02 LAB — URINALYSIS, ROUTINE W REFLEX MICROSCOPIC
BILIRUBIN URINE: NEGATIVE
GLUCOSE, UA: NEGATIVE mg/dL
Ketones, ur: NEGATIVE mg/dL
Leukocytes, UA: NEGATIVE
Nitrite: NEGATIVE
PH: 5 (ref 5.0–8.0)
Protein, ur: NEGATIVE mg/dL
SPECIFIC GRAVITY, URINE: 1.012 (ref 1.005–1.030)
Urobilinogen, UA: 0.2 mg/dL (ref 0.0–1.0)

## 2014-01-02 LAB — PHOSPHORUS: Phosphorus: 10.2 mg/dL — ABNORMAL HIGH (ref 2.3–4.6)

## 2014-01-02 LAB — COMPREHENSIVE METABOLIC PANEL
ALK PHOS: 89 U/L (ref 39–117)
ALT: 45 U/L (ref 0–53)
AST: 17 U/L (ref 0–37)
Albumin: 3.5 g/dL (ref 3.5–5.2)
BILIRUBIN TOTAL: 0.3 mg/dL (ref 0.3–1.2)
BUN: 130 mg/dL — ABNORMAL HIGH (ref 6–23)
CHLORIDE: 89 meq/L — AB (ref 96–112)
CO2: 5 meq/L — AB (ref 19–32)
CREATININE: 20.63 mg/dL — AB (ref 0.50–1.35)
Calcium: 8.7 mg/dL (ref 8.4–10.5)
GFR calc Af Amer: 3 mL/min — ABNORMAL LOW (ref >90)
GFR, EST NON AFRICAN AMERICAN: 2 mL/min — AB (ref >90)
Glucose, Bld: 217 mg/dL — ABNORMAL HIGH (ref 70–99)
Potassium: 5.7 mEq/L — ABNORMAL HIGH (ref 3.7–5.3)
Sodium: 135 mEq/L — ABNORMAL LOW (ref 137–147)
Total Protein: 6.9 g/dL (ref 6.0–8.3)

## 2014-01-02 LAB — CBC
HCT: 33.2 % — ABNORMAL LOW (ref 39.0–52.0)
Hemoglobin: 12.2 g/dL — ABNORMAL LOW (ref 13.0–17.0)
MCH: 31.4 pg (ref 26.0–34.0)
MCHC: 36.7 g/dL — AB (ref 30.0–36.0)
MCV: 85.6 fL (ref 78.0–100.0)
PLATELETS: 191 10*3/uL (ref 150–400)
RBC: 3.88 MIL/uL — AB (ref 4.22–5.81)
RDW: 12.4 % (ref 11.5–15.5)
WBC: 13.6 10*3/uL — ABNORMAL HIGH (ref 4.0–10.5)

## 2014-01-02 LAB — PROTIME-INR
INR: 1.32 (ref 0.00–1.49)
PROTHROMBIN TIME: 16.1 s — AB (ref 11.6–15.2)

## 2014-01-02 LAB — CDS SEROLOGY

## 2014-01-02 LAB — MRSA PCR SCREENING: MRSA by PCR: NEGATIVE

## 2014-01-02 LAB — SODIUM, URINE, RANDOM: Sodium, Ur: 66 mEq/L

## 2014-01-02 LAB — CK: CK TOTAL: 462 U/L — AB (ref 7–232)

## 2014-01-02 LAB — CG4 I-STAT (LACTIC ACID): Lactic Acid, Venous: 10.98 mmol/L — ABNORMAL HIGH (ref 0.5–2.2)

## 2014-01-02 LAB — APTT: aPTT: 28 seconds (ref 24–37)

## 2014-01-02 LAB — URINE MICROSCOPIC-ADD ON

## 2014-01-02 LAB — SAMPLE TO BLOOD BANK

## 2014-01-02 LAB — SALICYLATE LEVEL

## 2014-01-02 LAB — TROPONIN I

## 2014-01-02 LAB — URIC ACID: Uric Acid, Serum: 16.5 mg/dL — ABNORMAL HIGH (ref 4.0–7.8)

## 2014-01-02 MED ORDER — HEPARIN SODIUM (PORCINE) 5000 UNIT/ML IJ SOLN
5000.0000 [IU] | Freq: Three times a day (TID) | INTRAMUSCULAR | Status: DC
  Administered 2014-01-02 – 2014-01-17 (×42): 5000 [IU] via SUBCUTANEOUS
  Filled 2014-01-02 (×49): qty 1

## 2014-01-02 MED ORDER — LIDOCAINE HCL (CARDIAC) 20 MG/ML IV SOLN
INTRAVENOUS | Status: AC
  Filled 2014-01-02: qty 5

## 2014-01-02 MED ORDER — ROCURONIUM BROMIDE 50 MG/5ML IV SOLN
INTRAVENOUS | Status: AC
  Filled 2014-01-02: qty 2

## 2014-01-02 MED ORDER — INSULIN ASPART 100 UNIT/ML ~~LOC~~ SOLN
1.0000 [IU] | SUBCUTANEOUS | Status: DC
  Administered 2014-01-02 – 2014-01-11 (×5): 1 [IU] via SUBCUTANEOUS

## 2014-01-02 MED ORDER — LORAZEPAM 2 MG/ML IJ SOLN
INTRAMUSCULAR | Status: AC
  Administered 2014-01-02: 2 mg
  Filled 2014-01-02: qty 1

## 2014-01-02 MED ORDER — ASPIRIN 81 MG PO CHEW: 324.0000 mg | CHEWABLE_TABLET | ORAL | Status: AC

## 2014-01-02 MED ORDER — ALBUTEROL SULFATE (2.5 MG/3ML) 0.083% IN NEBU
2.5000 mg | INHALATION_SOLUTION | RESPIRATORY_TRACT | Status: DC | PRN
  Administered 2014-01-12: 2.5 mg via RESPIRATORY_TRACT
  Filled 2014-01-02: qty 3

## 2014-01-02 MED ORDER — PANTOPRAZOLE SODIUM 40 MG IV SOLR: 40.0000 mg | Freq: Every day | INTRAVENOUS | Status: DC

## 2014-01-02 MED ORDER — PANTOPRAZOLE SODIUM 40 MG IV SOLR
40.0000 mg | Freq: Every day | INTRAVENOUS | Status: DC
  Administered 2014-01-02 – 2014-01-06 (×5): 40 mg via INTRAVENOUS
  Filled 2014-01-02 (×6): qty 40

## 2014-01-02 MED ORDER — FENTANYL CITRATE 0.05 MG/ML IJ SOLN
100.0000 ug | INTRAMUSCULAR | Status: AC | PRN
  Administered 2014-01-02 – 2014-01-07 (×3): 100 ug via INTRAVENOUS
  Filled 2014-01-02 (×5): qty 2

## 2014-01-02 MED ORDER — SODIUM CHLORIDE 0.9 % IV SOLN: INTRAVENOUS | Status: DC

## 2014-01-02 MED ORDER — PIPERACILLIN-TAZOBACTAM 3.375 G IVPB 30 MIN
3.3750 g | Freq: Four times a day (QID) | INTRAVENOUS | Status: DC
  Administered 2014-01-03 – 2014-01-04 (×6): 3.375 g via INTRAVENOUS
  Filled 2014-01-02 (×8): qty 50

## 2014-01-02 MED ORDER — SODIUM CHLORIDE 0.9 % IV SOLN
250.0000 mL | INTRAVENOUS | Status: DC | PRN
  Administered 2014-01-09: 250 mL via INTRAVENOUS

## 2014-01-02 MED ORDER — ETOMIDATE 2 MG/ML IV SOLN
INTRAVENOUS | Status: AC
  Filled 2014-01-02: qty 20

## 2014-01-02 MED ORDER — PRISMASOL BGK 4/2.5 32-4-2.5 MEQ/L IV SOLN
INTRAVENOUS | Status: DC
  Administered 2014-01-02 – 2014-01-04 (×2): via INTRAVENOUS_CENTRAL
  Filled 2014-01-02 (×5): qty 5000

## 2014-01-02 MED ORDER — ROCURONIUM BROMIDE 50 MG/5ML IV SOLN
INTRAVENOUS | Status: AC | PRN
  Administered 2014-01-02: 100 mg via INTRAVENOUS

## 2014-01-02 MED ORDER — ASPIRIN 300 MG RE SUPP
300.0000 mg | RECTAL | Status: AC
  Administered 2014-01-03: 300 mg via RECTAL
  Filled 2014-01-02: qty 1

## 2014-01-02 MED ORDER — HEPARIN SODIUM (PORCINE) 1000 UNIT/ML DIALYSIS
1000.0000 [IU] | INTRAMUSCULAR | Status: DC | PRN
  Administered 2014-01-04: 4000 [IU] via INTRAVENOUS_CENTRAL
  Filled 2014-01-02: qty 4
  Filled 2014-01-02: qty 6

## 2014-01-02 MED ORDER — PROPOFOL 10 MG/ML IV EMUL
INTRAVENOUS | Status: AC
  Filled 2014-01-02: qty 100

## 2014-01-02 MED ORDER — SODIUM CHLORIDE 0.9 % IV SOLN
INTRAVENOUS | Status: DC | PRN
  Administered 2014-01-02: 125 mL/h via INTRAVENOUS
  Administered 2014-01-05: 1000 mL via INTRAVENOUS

## 2014-01-02 MED ORDER — SODIUM CHLORIDE 0.9 % IV BOLUS (SEPSIS): 125.0000 mL | Freq: Once | INTRAVENOUS | Status: DC

## 2014-01-02 MED ORDER — SODIUM CHLORIDE 0.9 % IV SOLN
250.0000 mL | INTRAVENOUS | Status: DC | PRN
  Administered 2014-01-03: 250 mL via INTRAVENOUS

## 2014-01-02 MED ORDER — SODIUM CHLORIDE 0.9 % FOR CRRT
INTRAVENOUS_CENTRAL | Status: DC | PRN
  Filled 2014-01-02: qty 1000

## 2014-01-02 MED ORDER — HEPARIN SODIUM (PORCINE) 5000 UNIT/ML IJ SOLN: 5000.0000 [IU] | Freq: Three times a day (TID) | INTRAMUSCULAR | Status: DC

## 2014-01-02 MED ORDER — SODIUM CHLORIDE 0.9 % IV SOLN
500.0000 mg | Freq: Two times a day (BID) | INTRAVENOUS | Status: DC
  Administered 2014-01-02 – 2014-01-07 (×11): 500 mg via INTRAVENOUS
  Filled 2014-01-02 (×13): qty 5

## 2014-01-02 MED ORDER — SUCCINYLCHOLINE CHLORIDE 20 MG/ML IJ SOLN
INTRAMUSCULAR | Status: AC
  Filled 2014-01-02: qty 1

## 2014-01-02 MED ORDER — PRISMASOL BGK 0/2.5 32-2.5 MEQ/L IV SOLN
INTRAVENOUS | Status: DC
  Administered 2014-01-02 – 2014-01-04 (×14): via INTRAVENOUS_CENTRAL
  Filled 2014-01-02 (×43): qty 5000

## 2014-01-02 MED ORDER — PROPOFOL 10 MG/ML IV EMUL
5.0000 ug/kg/min | INTRAVENOUS | Status: DC
  Administered 2014-01-02: 50 ug/kg/min via INTRAVENOUS
  Administered 2014-01-03 (×2): 55 ug/kg/min via INTRAVENOUS
  Administered 2014-01-03: 45 ug/kg/min via INTRAVENOUS
  Administered 2014-01-03: 54.989 ug/kg/min via INTRAVENOUS
  Administered 2014-01-04: 30 ug/kg/min via INTRAVENOUS
  Administered 2014-01-04: 25 ug/kg/min via INTRAVENOUS
  Administered 2014-01-04: 35 ug/kg/min via INTRAVENOUS
  Administered 2014-01-04: 45 ug/kg/min via INTRAVENOUS
  Administered 2014-01-04: 30 ug/kg/min via INTRAVENOUS
  Administered 2014-01-04: 45 ug/kg/min via INTRAVENOUS
  Administered 2014-01-04: 35 ug/kg/min via INTRAVENOUS
  Administered 2014-01-04: 30 ug/kg/min via INTRAVENOUS
  Administered 2014-01-04: 20 ug/kg/min via INTRAVENOUS
  Administered 2014-01-05 (×2): 45 ug/kg/min via INTRAVENOUS
  Administered 2014-01-05: 40 ug/kg/min via INTRAVENOUS
  Administered 2014-01-05: 45 ug/kg/min via INTRAVENOUS
  Administered 2014-01-05: 50 ug/kg/min via INTRAVENOUS
  Administered 2014-01-05: 50.076 ug/kg/min via INTRAVENOUS
  Administered 2014-01-06: 40 ug/kg/min via INTRAVENOUS
  Administered 2014-01-06: 50.076 ug/kg/min via INTRAVENOUS
  Administered 2014-01-06: 60 ug/kg/min via INTRAVENOUS
  Administered 2014-01-06: 35 ug/kg/min via INTRAVENOUS
  Administered 2014-01-06: 60 ug/kg/min via INTRAVENOUS
  Administered 2014-01-06: 40 ug/kg/min via INTRAVENOUS
  Administered 2014-01-07: 60 ug/kg/min via INTRAVENOUS
  Administered 2014-01-07: 50 ug/kg/min via INTRAVENOUS
  Administered 2014-01-07: 60 ug/kg/min via INTRAVENOUS
  Administered 2014-01-07: 70 ug/kg/min via INTRAVENOUS
  Filled 2014-01-02 (×27): qty 100

## 2014-01-02 MED ORDER — PRISMASOL BGK 4/2.5 32-4-2.5 MEQ/L IV SOLN
INTRAVENOUS | Status: DC
  Administered 2014-01-02 – 2014-01-04 (×2): via INTRAVENOUS_CENTRAL
  Filled 2014-01-02 (×9): qty 5000

## 2014-01-02 MED ORDER — FENTANYL CITRATE 0.05 MG/ML IJ SOLN
100.0000 ug | INTRAMUSCULAR | Status: DC | PRN
  Administered 2014-01-06 – 2014-01-11 (×11): 100 ug via INTRAVENOUS
  Filled 2014-01-02 (×9): qty 2

## 2014-01-02 MED ORDER — ALTEPLASE 2 MG IJ SOLR
2.0000 mg | Freq: Once | INTRAMUSCULAR | Status: AC | PRN
  Filled 2014-01-02: qty 2

## 2014-01-02 MED ORDER — PROPOFOL 10 MG/ML IV EMUL
5.0000 ug/kg/min | Freq: Once | INTRAVENOUS | Status: AC
  Administered 2014-01-02: 5 ug/kg/min via INTRAVENOUS

## 2014-01-02 MED ORDER — SODIUM CHLORIDE 0.9 % IV SOLN
1000.0000 mg | Freq: Once | INTRAVENOUS | Status: AC
  Administered 2014-01-02: 1000 mg via INTRAVENOUS
  Filled 2014-01-02: qty 10

## 2014-01-02 MED ORDER — SODIUM CHLORIDE 0.9 % IV SOLN
INTRAVENOUS | Status: DC
  Administered 2014-01-03 – 2014-01-07 (×5): via INTRAVENOUS

## 2014-01-02 MED ORDER — ETOMIDATE 2 MG/ML IV SOLN
INTRAVENOUS | Status: AC | PRN
  Administered 2014-01-02: 20 mg via INTRAVENOUS

## 2014-01-02 NOTE — H&P (Signed)
History   Brady Hoffman is an 48 y.o. male.   Chief Complaint:  Chief Complaint  Patient presents with  . Seizures   Pt brought in by EMS from group home.  Report that pt fell around 12 today and struck head from level ground. Had lucent period without complaints then developed seizure like activity and MS changes around 4 pm today.  Brought in as level 1 trauma and intubated in ED.  No other history provided or know. Unclear of what "seizure " activity consisted.  HD stable.  Seizures   Past Medical History  Diagnosis Date  . Hyperlipidemia   . Schizoaffective disorder   . H/O: GI bleed     Past Surgical History  Procedure Laterality Date  . Mole removal    . Wisdom tooth extraction    . Esophagogastroduodenoscopy  11/01/2011    Procedure: ESOPHAGOGASTRODUODENOSCOPY (EGD);  Surgeon: Beryle Beams;  Location: [facility];  Service: Endoscopy;  Laterality: N/A;    No family history on file. Social History:  reports that he has never smoked. He has never used smokeless tobacco. He reports that he does not drink alcohol or use illicit drugs.  Allergies   Allergies  Allergen Reactions  . Risperidone And Related     Pt. States head feels like a rock    Home Medications   (Not in a hospital admission)  Trauma Course   Results for orders placed during the hospital encounter of 01/02/14 (from the past 48 hour(s))  SAMPLE TO BLOOD BANK     Status: None   Collection Time    01/02/14  5:20 PM      Result Value Range   Blood Bank Specimen SAMPLE AVAILABLE FOR TESTING     Sample Expiration 01/03/2014    GLUCOSE, CAPILLARY     Status: Abnormal   Collection Time    01/02/14  5:38 PM      Result Value Range   Glucose-Capillary 191 (*) 70 - 99 mg/dL   Comment 1 Notify RN     Comment 2 Documented in Chart    CDS SEROLOGY     Status: None   Collection Time    01/02/14  5:41 PM      Result Value Range   CDS serology specimen       Value: SPECIMEN WILL BE HELD FOR 14  DAYS IF TESTING IS REQUIRED  CG4 I-STAT (LACTIC ACID)     Status: Abnormal   Collection Time    01/02/14  5:55 PM      Result Value Range   Lactic Acid, Venous 10.98 (*) 0.5 - 2.2 mmol/L   Dg Pelvis Portable  01/02/2014   CLINICAL DATA:  Fall and seizures  EXAM: PORTABLE PELVIS 1-2 VIEWS  COMPARISON:  None.  FINDINGS: There is no evidence of pelvic fracture or diastasis. No other pelvic bone lesions are seen.  IMPRESSION: Negative.   Electronically Signed   By: Kathreen Devoid   On: 01/02/2014 17:54   Dg Chest Port 1 View  01/02/2014   CLINICAL DATA:  Fall, seizures  EXAM: PORTABLE CHEST - 1 VIEW  COMPARISON:  None.  FINDINGS: There is an endotracheal tube with the tip 3.6 cm above the carina. There are low lung volumes. There is no focal consolidation, pleural effusion or pneumothorax. Normal cardiomediastinal silhouette. The osseous structures are unremarkable.  There is a nasogastric tube with the tip in the upper mid esophagus.  IMPRESSION: 1. Endotracheal tube with the tip  3.6 cm above the carina. 2. There is a nasogastric tube with the tip in the upper mid esophagus.   Electronically Signed   By: Kathreen Devoid   On: 01/02/2014 17:54  HEAD CT NORMAL PER RADIOLOGIST BOYLES C SPINE NORMAL PER RADIOLOGIST BOYLES  Review of Systems  Unable to perform ROS Neurological: Positive for seizures.    Height 6\' 2"  (1.88 m), SpO2 99.00%. Physical Exam  Constitutional: He appears well-developed and well-nourished.  intubated  HENT:  Right Ear: External ear normal.  Left Ear: External ear normal.  Mouth/Throat: No oropharyngeal exudate.  Small amount of blood on lips. (after intubation)  Eyes: Left eye exhibits no discharge. No scleral icterus. Right pupil is reactive. Left pupil is reactive. Pupils are equal.  Pinpoint but received medications for intubation.   Cardiovascular: Normal rate and regular rhythm.   Respiratory: Effort normal and breath sounds normal. No respiratory distress. He has no  wheezes. He has no rales. He exhibits no tenderness.  GI: Soft. Bowel sounds are normal. He exhibits no distension and no mass. There is no tenderness. There is no rebound and no guarding.  Genitourinary: Rectum normal and penis normal. Guaiac negative stool.  Musculoskeletal: Normal range of motion.  Neurological:  Intubated paralyzed.  GCS 6 upon arrival.   Skin: Skin is warm and dry.      Assessment/Plan No evidence of traumatic injury. Lactate 10 without signs of trauma.   Check CT abdomen pelvis to rule out other causes of MS changes/ intraabdominal pathology  and await CBC/ABG labs. Recommend CCM involvement and general medicine.  No external evidence of traumatic injury at this point.   Gwen Edler A. 01/02/2014, 6:09 PM   Procedures

## 2014-01-02 NOTE — Progress Notes (Signed)
Chaplain paged to level 1 fall. Medical team working on patient. No family present. Nursing to contact chaplain when family arrives.   01/02/14 1720  Clinical Encounter Type  Visited With Health care provider  Visit Type Initial;ED

## 2014-01-02 NOTE — ED Notes (Signed)
Pt gagging on ETT and attempting to pull tubes.  Propofol dose changed.

## 2014-01-02 NOTE — ED Notes (Signed)
Patient belongings at bedside with label.

## 2014-01-02 NOTE — ED Provider Notes (Signed)
level 5 caveat patient unresponsive history is obtained from paramedics. Patient fell striking his head and today. He was well for a period of time until he had a generalized seizure. EMS arrived to find patient unresponsive. Treated patient with supplemental oxygen her pain CBG which was 210. On exam patient is unresponsive to come score of 6 eye opening equals 4 verbal score equals 1 motor score equals 1. HEENT exam pupils 2 mm reactive to light there is slight amount of blood in his mouth otherwise atraumatic. Neck is atraumatic lungs clear breath sounds heart regular rate and rhythmn mildly tachycardic abdomen nondistended without signs of trauma all 4 extremities without signs of trauma neurologic Glasgow Coma Score 6 gag reflex absent DTRs symmetric bilaterally knee jerk ankle jerk and biceps toes neither upward or downward going. Level I TRAUMA ALERT call due to history of trauma and Glasgow Coma Score 6. Patient was intubated by Dr. Tyrone Nine under my supervision. I was present during entire procedure Keppra ordered. The patient was premedicated with Ativan, etomidate and rocker on him prior to intubation. Dr. Brantley Stage trauma surgeon came and evaluate patient. Reports needed CT scans were negative. Patient is cleared from a trauma standpoint he requests that we call critical care medicine to evaluate patient. I-STAT 8 is consistent with acute renal failure Critical care with consult and will evaluate patient for admission Diagnosis #1 new onset seizure  #2 renal failure CRITICAL CARE Performed by: Orlie Dakin Total critical care time: 30 minute Critical care time was exclusive of separately billable procedures and treating other patients. Critical care was necessary to treat or prevent imminent or life-threatening deterioration. Critical care was time spent personally by me on the following activities: development of treatment plan with patient and/or surrogate as well as nursing, discussions with  consultants, evaluation of patient's response to treatment, examination of patient, obtaining history from patient or surrogate, ordering and performing treatments and interventions, ordering and review of laboratory studies, ordering and review of radiographic studies, pulse oximetry and re-evaluation of patient's condition.  Orlie Dakin, MD 01/02/14 1840

## 2014-01-02 NOTE — Progress Notes (Signed)
48yo male resident of group home sustained fall w/ head trauma followed by generalized Sz, now intubated, to begin IV ABX for aspiration PNA.  Will start Zosyn 3.375g IV Q6H for CRRT and monitor CBC, Cx, and renal function.  Wynona Neat, PharmD, BCPS  01/02/2014 11:10 PM

## 2014-01-02 NOTE — ED Notes (Signed)
DR.Jacubowitz given results of Istat lactic acid and Chem8 lab results. ED-Lab.

## 2014-01-02 NOTE — Progress Notes (Signed)
Orthopedic Tech Progress Note Patient Details:  Brady Hoffman 1966/05/08 [id number]  Patient ID: Brady Hoffman, male   DOB: Feb 14, 1966, 48 y.o.   MRN: [id number] Made level 1 trauma visit  Hildred Priest 01/02/2014, 5:40 PM

## 2014-01-02 NOTE — H&P (Signed)
Name: Brady Hoffman MRN: [id number] DOB: [birth year]    ADMISSION DATE:  01/02/2014  PRIMARY SERVICE: PCCM  CHIEF COMPLAINT:  Altered mental status, Seizure, Acute renal failure  BRIEF PATIENT DESCRIPTION:  48 years old male with Schizoaffective disorder, resident of a group home. Was in his usual state of health and sustained a fall with head trauma. He then had a generalized seizure. At arrival to the ED on acute renal failure, intubated for airway protection.   SIGNIFICANT EVENTS / STUDIES:  - CT scan of the abdomen unremarkable except for LLL lung consolidation - CT head unremarkable  LINES / TUBES: - Left IJ CVC - Right femoral dialysis catheter - ETT - Foley catheter - OGT   CULTURES: - Blood cultures sent  ANTIBIOTICS: - Zosyn   HISTORY OF PRESENT ILLNESS:   48 years old male with Schizoaffective disorder, resident of a group home. Was in his usual state of health and sustained a fall with head trauma. He then had a generalized seizure. At arrival to the ED the patient was unresponsive and was intubated for airway protection. He was found to have a creatinine of 20 (his previous one was 1, K 5.7 with metabolic acidosis and elevated WBC. CT head was unremarkable and CT abdomen did not show any intraabdominal pathology but showed a LLL consolidation likely secondary to aspiration. His ABG at admission was pH 7.17, pCO2: 34, pO2 476.  PAST MEDICAL HISTORY :  Past Medical History  Diagnosis Date  . Hyperlipidemia   . Schizoaffective disorder   . H/O: GI bleed    Past Surgical History  Procedure Laterality Date  . Mole removal    . Wisdom tooth extraction    . Esophagogastroduodenoscopy  11/01/2011    Procedure: ESOPHAGOGASTRODUODENOSCOPY (EGD);  Surgeon: Beryle Beams;  Location: [facility];  Service: Endoscopy;  Laterality: N/A;   Prior to Admission medications   Medication Sig Start Date End Date Taking? Authorizing Provider  atorvastatin (LIPITOR) 20  MG tablet Take 20 mg by mouth at bedtime.   Yes Historical Provider, MD  benztropine (COGENTIN) 1 MG tablet Take 2 mg by mouth at bedtime.   Yes Historical Provider, MD  Cholecalciferol (VITAMIN D3) 5000 UNITS TABS Take 5,000 Units by mouth at bedtime.   Yes Historical Provider, MD  cloZAPine (CLOZARIL) 100 MG tablet Take 100-400 mg by mouth 2 (two) times daily. Take 1 tablet (100  Mg) twice daily at 8am and 1pm, take 4 tablets (400 mg) at bedtime   Yes Historical Provider, MD  docusate sodium (COLACE) 100 MG capsule Take 200 mg by mouth 2 (two) times daily.    Yes Historical Provider, MD  fluPHENAZine (PROLIXIN) 5 MG tablet Take 5 mg by mouth 2 (two) times daily.   Yes Historical Provider, MD  methylcellulose (CITRUCEL FIBER LAXATIVE) packet Take by mouth 3 (three) times daily as needed for constipation. Take as directed on label   Yes Historical Provider, MD  omeprazole (PRILOSEC) 40 MG capsule Take 40 mg by mouth daily.   Yes Historical Provider, MD  polyethylene glycol (MIRALAX / GLYCOLAX) packet Take 17 g by mouth daily. Mix 1 capful in 4-8 oz of water or juice by mouth daily   Yes Historical Provider, MD  senna (SENOKOT) 8.6 MG TABS tablet Take 2 tablets by mouth at bedtime as needed for mild constipation.   Yes Historical Provider, MD  venlafaxine XR (EFFEXOR-XR) 75 MG 24 hr capsule Take 225 mg by mouth daily.  Yes Historical Provider, MD   Allergies  Allergen Reactions  . Risperidone And Related     Pt. States head feels like a rock    FAMILY HISTORY:  History reviewed. No pertinent family history. SOCIAL HISTORY:  reports that he has never smoked. He has never used smokeless tobacco. He reports that he does not drink alcohol or use illicit drugs.  REVIEW OF SYSTEMS:  Unable to provide  SUBJECTIVE:   VITAL SIGNS: Temp:  [95.9 F (35.5 C)-98.1 F (36.7 C)] 96.1 F (35.6 C) (02/01 2215) Pulse Rate:  [70-118] 71 (02/01 2215) Resp:  [11-43] 16 (02/01 2215) BP: (131-146)/(83-91)  136/87 mmHg (02/01 2215) SpO2:  [97 %-100 %] 100 % (02/01 2215) FiO2 (%):  [50 %-100 %] 50 % (02/01 2019) Weight:  [194 lb 7.1 oz (88.2 kg)-211 lb 10.3 oz (96 kg)] 194 lb 7.1 oz (88.2 kg) (02/01 2019) HEMODYNAMICS:   VENTILATOR SETTINGS: Vent Mode:  [-] PRVC FiO2 (%):  [50 %-100 %] 50 % Set Rate:  [16 bmp] 16 bmp Vt Set:  [650 mL] 650 mL PEEP:  [5 cmH20] 5 cmH20 Plateau Pressure:  [12 cmH20-14 cmH20] 14 cmH20 INTAKE / OUTPUT: Intake/Output     02/01 0701 - 02/02 0700   I.V. (mL/kg) 111.7 (1.3)   IV Piggyback 375   Total Intake(mL/kg) 486.7 (5.5)   Urine (mL/kg/hr) 225   Total Output 225   Net +261.7         PHYSICAL EXAMINATION: General: Sedated, intubated, no acute distress. Eyes: Anicteric sclerae. Pupils are ENT: ETT in place. Trachea at midline.  Lymph: No cervical, supraclavicular, or axillary lymphadenopathy. Heart: Normal S1, S2. No murmurs, rubs, or gallops appreciated. No bruits, equal pulses. Lungs: Normal excursion, no dullness to percussion. Good air movement bilaterally, without wheezes or crackles.  Abdomen: Abdomen soft, non-tender and not distended, normoactive bowel sounds. No hepatosplenomegaly or masses. Musculoskeletal: No clubbing or synovitis. No LE edema Skin: No rashes or lesions Neuro: Patient is unresponsive.    LABS:  CBC  Recent Labs Lab 01/02/14 1741  WBC 13.6*  HGB 12.2*  HCT 33.2*  PLT 191   Coag's  Recent Labs Lab 01/02/14 1741  APTT 28  INR 1.32   BMET  Recent Labs Lab 01/02/14 1741  NA 135*  K 5.7*  CL 89*  CO2 5*  BUN 130*  CREATININE 20.63*  GLUCOSE 217*   Electrolytes  Recent Labs Lab 01/02/14 1741  CALCIUM 8.7   Sepsis Markers  Recent Labs Lab 01/02/14 1755  LATICACIDVEN 10.98*   ABG  Recent Labs Lab 01/02/14 1809  PHART 7.179*  PCO2ART 34.3*  PO2ART 476.0*   Liver Enzymes  Recent Labs Lab 01/02/14 1741  AST 17  ALT 45  ALKPHOS 89  BILITOT 0.3  ALBUMIN 3.5   Cardiac  Enzymes No results found for this basename: TROPONINI, PROBNP,  in the last 168 hours Glucose  Recent Labs Lab 01/02/14 1738 01/02/14 2038  GLUCAP 191* 147*    Imaging Ct Abdomen Pelvis Wo Contrast  01/02/2014   CLINICAL DATA:  Patient fell approximately 8 hr ago and hit head, developed seizure and mental status changes, intubated  EXAM: CT ABDOMEN AND PELVIS WITHOUT CONTRAST  TECHNIQUE: Multidetector CT imaging of the abdomen and pelvis was performed following the standard protocol without intravenous contrast.  COMPARISON:  None.  FINDINGS: There is left lower lobe consolidation. There is likely a small left effusion. There is beam artifact across lung base from positioning of the patient's  arms. An NG tube extends with its tip just barely into the stomach.  Liver, spleen, pancreas, kidneys, and adrenal glands are normal. There are gallstones. Aorta is not dilated. Bowel is normal. No ascites. There is a Foley balloon catheter in the bladder. Reproductive organs are normal. No acute musculoskeletal findings.  IMPRESSION: 1. Left lower lobe consolidation and small left effusion. The possibility of aspiration pneumonitis cannot be excluded. 2. NG tube tip just barely into stomach.   Electronically Signed   By: Skipper Cliche M.D.   On: 01/02/2014 20:35   Ct Head Wo Contrast  01/02/2014   CLINICAL DATA:  Seizure this afternoon, fell earlier today, history hyperlipidemia, schizoaffective disorder  EXAM: CT HEAD WITHOUT CONTRAST  CT CERVICAL SPINE WITHOUT CONTRAST  TECHNIQUE: Multidetector CT imaging of the head and cervical spine was performed following the standard protocol without intravenous contrast. Multiplanar CT image reconstructions of the cervical spine were also generated.  COMPARISON:  01/22/2007  FINDINGS: CT HEAD FINDINGS  Minimal atrophy.  Normal ventricular morphology.  No midline shift or mass effect.  Otherwise normal appearance of brain parenchyma.  No intracranial hemorrhage, mass  lesion or evidence acute infarction.  No extra-axial fluid collections.  Bones and sinuses unremarkable.  CT CERVICAL SPINE FINDINGS  Skullbase intact.  Prevertebral soft tissues normal thickness.  Nasogastric tube coiled in hypopharynx.  Vertebral body and disc space heights maintained.  No acute fracture, subluxation or bone destruction.  Soft tissues unremarkable.  IMPRESSION: No acute intracranial abnormalities.  No acute cervical spine abnormalities.   Electronically Signed   By: Lavonia Dana M.D.   On: 01/02/2014 18:07   Ct Cervical Spine Wo Contrast  01/02/2014   CLINICAL DATA:  Seizure this afternoon, fell earlier today, history hyperlipidemia, schizoaffective disorder  EXAM: CT HEAD WITHOUT CONTRAST  CT CERVICAL SPINE WITHOUT CONTRAST  TECHNIQUE: Multidetector CT imaging of the head and cervical spine was performed following the standard protocol without intravenous contrast. Multiplanar CT image reconstructions of the cervical spine were also generated.  COMPARISON:  01/22/2007  FINDINGS: CT HEAD FINDINGS  Minimal atrophy.  Normal ventricular morphology.  No midline shift or mass effect.  Otherwise normal appearance of brain parenchyma.  No intracranial hemorrhage, mass lesion or evidence acute infarction.  No extra-axial fluid collections.  Bones and sinuses unremarkable.  CT CERVICAL SPINE FINDINGS  Skullbase intact.  Prevertebral soft tissues normal thickness.  Nasogastric tube coiled in hypopharynx.  Vertebral body and disc space heights maintained.  No acute fracture, subluxation or bone destruction.  Soft tissues unremarkable.  IMPRESSION: No acute intracranial abnormalities.  No acute cervical spine abnormalities.   Electronically Signed   By: Lavonia Dana M.D.   On: 01/02/2014 18:07   Dg Pelvis Portable  01/02/2014   CLINICAL DATA:  Fall and seizures  EXAM: PORTABLE PELVIS 1-2 VIEWS  COMPARISON:  None.  FINDINGS: There is no evidence of pelvic fracture or diastasis. No other pelvic bone lesions  are seen.  IMPRESSION: Negative.   Electronically Signed   By: Kathreen Devoid   On: 01/02/2014 17:54   Dg Chest Port 1 View  01/02/2014   CLINICAL DATA:  Fall, seizures  EXAM: PORTABLE CHEST - 1 VIEW  COMPARISON:  None.  FINDINGS: There is an endotracheal tube with the tip 3.6 cm above the carina. There are low lung volumes. There is no focal consolidation, pleural effusion or pneumothorax. Normal cardiomediastinal silhouette. The osseous structures are unremarkable.  There is a nasogastric tube with the tip  in the upper mid esophagus.  IMPRESSION: 1. Endotracheal tube with the tip 3.6 cm above the carina. 2. There is a nasogastric tube with the tip in the upper mid esophagus.   Electronically Signed   By: Kathreen Devoid   On: 01/02/2014 17:54     CXR:  Left IJ in adequate position. LLL infiltrate.   ASSESSMENT / PLAN:  PULMONARY A: 1) Aspiration pneumonia P:   - Mechanical ventilation   - PRVC, Vt: 8cc/kg, PEEP: 5, RR: 16, FiO2: 100% and adjust to keep O2 sat > 94%   - VAP prevention order set   - Daily awakening and SBT - Zosyn  CARDIOVASCULAR A:  1) Hemodynamically stable P:  - Will continue IVF resuscitation  RENAL A:   1) Acute renal failure, unclear etiology,  2) Anion gap metabolic acidosis 3) Mild hyperkalemia P:   - Nephrology input appreciated - Will start CRRT - Will get labs per nephrology: HIV, C3, C4, hepatitis, ANA, CK - Continue IVF resuscitation with NS at 200cc / hr - Aspirin and acetaminophen level  GASTROINTESTINAL A:   1) No issues P:   - GI prophylaxis with protonix  HEMATOLOGIC A:   1) Mild anemia P:  - Will follow CBC  INFECTIOUS A:   - Aspiration pneumonia P:   - Zosyn  ENDOCRINE A:   1) Hyperglycemia  P:   - Novolog sliding scale per ICU protocol.   NEUROLOGIC A:   1) Seizures 2) AMS possible post ictal  P:   - Keppra 500 mg IV BID - Neurology consult in am  - Urine drug screen   TODAY'S SUMMARY:   I have personally  obtained a history, examined the patient, evaluated laboratory and imaging results, formulated the assessment and plan and placed orders.  CRITICAL CARE: The patient is critically ill with multiple organ systems failure and requires high complexity decision making for assessment and support, frequent evaluation and titration of therapies, application of advanced monitoring technologies and extensive interpretation of multiple databases. Critical Care Time devoted to patient care services described in this note is 90 minutes.   Waynetta Pean, MD Pulmonary and [place] Pager: [phone]  01/02/2014, 10:30 PM

## 2014-01-02 NOTE — ED Notes (Addendum)
Pt from group home.  Per EMS, pt had a fall this morning with no subsequent complaints.  This afternoon pt had a witnessed seizure lasting 2 minutes.  No seizure hx.  Pt unresponsive upon arrival to ED.  EDP Jacubowitz and Dr Tyrone Nine at bedside.  Pt to be intubated.  GCS 6 per EDP.

## 2014-01-02 NOTE — ED Provider Notes (Signed)
CSN: [id number]     Arrival date & time 01/02/14  1717 History   First MD Initiated Contact with Patient 01/02/14 1733     Chief Complaint  Patient presents with  . Seizures   (Consider location/radiation/quality/duration/timing/severity/associated sxs/prior Treatment) Patient is a 48 y.o. male presenting with seizures.  Seizures Seizure activity on arrival: no   Seizure type:  Grand mal Preceding symptoms comment:  Unable to specify Initial focality:  Unable to specify Episode characteristics: abnormal movements, confusion, disorientation, generalized shaking, stiffening, tongue biting and unresponsiveness   Episode characteristics: no focal shaking   Postictal symptoms: confusion   Return to baseline: no   Severity:  Severe Duration:  1 minute Timing:  Once Context: not alcohol withdrawal, not cerebral palsy, not change in medication, medical compliance and not possible hypoglycemia   Recent head injury:  Within the last 24 hours PTA treatment:  None History of seizures: no     Past Medical History  Diagnosis Date  . Hyperlipidemia   . Schizoaffective disorder   . H/O: GI bleed    Past Surgical History  Procedure Laterality Date  . Mole removal    . Wisdom tooth extraction    . Esophagogastroduodenoscopy  11/01/2011    Procedure: ESOPHAGOGASTRODUODENOSCOPY (EGD);  Surgeon: Beryle Beams;  Location: [facility];  Service: Endoscopy;  Laterality: N/A;   History reviewed. No pertinent family history. History  Substance Use Topics  . Smoking status: Never Smoker   . Smokeless tobacco: Never Used  . Alcohol Use: No    Review of Systems  Unable to perform ROS: Patient unresponsive  Neurological: Positive for seizures.    Allergies  Risperidone and related  Home Medications   No current outpatient prescriptions on file. BP 139/89  Pulse 61  Temp(Src) 95.2 F (35.1 C) (Core (Comment))  Resp 16  Ht 6\' 2"  (1.88 m)  Wt 194 lb 7.1 oz (88.2 kg)  BMI 24.95 kg/m2   SpO2 100% Physical Exam  Constitutional: He appears well-developed and well-nourished.  HENT:  Head: Normocephalic and atraumatic.  Eyes: EOM are normal. Pupils are equal, round, and reactive to light.  Neck: Normal range of motion. Neck supple. No JVD present.  Cardiovascular: Normal rate and regular rhythm.  Exam reveals no gallop and no friction rub.   No murmur heard. Pulmonary/Chest: No respiratory distress. He has no wheezes.  Abdominal: He exhibits no distension. There is no rebound and no guarding.  Musculoskeletal: Normal range of motion.  Neurological: He is unresponsive. GCS eye subscore is 4. GCS verbal subscore is 1. GCS motor subscore is 1.  Unresponsive to pain  Skin: No rash noted. No pallor.    ED Course  INTUBATION Date/Time: 01/02/2014 7:18 PM Performed by: Deno Etienne Authorized by: Deno Etienne Consent: The procedure was performed in an emergent situation. Patient identity confirmed: arm band Time out: Immediately prior to procedure a "time out" was called to verify the correct patient, procedure, equipment, support staff and site/side marked as required. Indications: airway protection Intubation method: direct Patient status: paralyzed (RSI) Preoxygenation: nonrebreather mask and BVM Sedatives: etomidate Paralytic: rocuronium Laryngoscope size: Mac 4 Tube size: 7.5 mm Tube type: cuffed Number of attempts: 1 Cricoid pressure: no Cords visualized: yes Post-procedure assessment: chest rise,  ETCO2 monitor and CO2 detector Breath sounds: equal and absent over the epigastrium Cuff inflated: yes ETT to lip: 25 cm Tube secured with: ETT holder Chest x-ray interpreted by me and radiologist. Chest x-ray findings: endotracheal tube in appropriate position  Patient tolerance: Patient tolerated the procedure well with no immediate complications.   (including critical care time) Labs Review Labs Reviewed  COMPREHENSIVE METABOLIC PANEL - Abnormal; Notable for the  following:    Sodium 135 (*)    Potassium 5.7 (*)    Chloride 89 (*)    CO2 5 (*)    Glucose, Bld 217 (*)    BUN 130 (*)    Creatinine, Ser 20.63 (*)    GFR calc non Af Amer 2 (*)    GFR calc Af Amer 3 (*)    All other components within normal limits  CBC - Abnormal; Notable for the following:    WBC 13.6 (*)    RBC 3.88 (*)    Hemoglobin 12.2 (*)    HCT 33.2 (*)    MCHC 36.7 (*)    All other components within normal limits  PROTIME-INR - Abnormal; Notable for the following:    Prothrombin Time 16.1 (*)    All other components within normal limits  URINALYSIS, ROUTINE W REFLEX MICROSCOPIC - Abnormal; Notable for the following:    Hgb urine dipstick MODERATE (*)    All other components within normal limits  GLUCOSE, CAPILLARY - Abnormal; Notable for the following:    Glucose-Capillary 191 (*)    All other components within normal limits  GLUCOSE, CAPILLARY - Abnormal; Notable for the following:    Glucose-Capillary 147 (*)    All other components within normal limits  CG4 I-STAT (LACTIC ACID) - Abnormal; Notable for the following:    Lactic Acid, Venous 10.98 (*)    All other components within normal limits  POCT I-STAT 3, BLOOD GAS (G3+) - Abnormal; Notable for the following:    pH, Arterial 7.179 (*)    pCO2 arterial 34.3 (*)    pO2, Arterial 476.0 (*)    Bicarbonate 12.8 (*)    Acid-base deficit 15.0 (*)    All other components within normal limits  MRSA PCR SCREENING  CULTURE, BLOOD (ROUTINE X 2)  CULTURE, BLOOD (ROUTINE X 2)  URINE CULTURE  CDS SEROLOGY  APTT  URINE MICROSCOPIC-ADD ON  TROPONIN I  CREATININE, URINE, RANDOM  SODIUM, URINE, RANDOM  ACETAMINOPHEN LEVEL  SALICYLATE LEVEL  DRUGS OF ABUSE SCREEN W ALC, ROUTINE URINE  CK  TROPONIN I  TROPONIN I  PHOSPHORUS  URIC ACID  C3 COMPLEMENT  C4 COMPLEMENT  HIV ANTIBODY (ROUTINE TESTING)  HEPATITIS PANEL, ACUTE  RENAL FUNCTION PANEL  CBC  MAGNESIUM  APTT  ANA  SAMPLE TO BLOOD BANK   Imaging  Review Ct Abdomen Pelvis Wo Contrast  01/02/2014   CLINICAL DATA:  Patient fell approximately 8 hr ago and hit head, developed seizure and mental status changes, intubated  EXAM: CT ABDOMEN AND PELVIS WITHOUT CONTRAST  TECHNIQUE: Multidetector CT imaging of the abdomen and pelvis was performed following the standard protocol without intravenous contrast.  COMPARISON:  None.  FINDINGS: There is left lower lobe consolidation. There is likely a small left effusion. There is beam artifact across lung base from positioning of the patient's arms. An NG tube extends with its tip just barely into the stomach.  Liver, spleen, pancreas, kidneys, and adrenal glands are normal. There are gallstones. Aorta is not dilated. Bowel is normal. No ascites. There is a Foley balloon catheter in the bladder. Reproductive organs are normal. No acute musculoskeletal findings.  IMPRESSION: 1. Left lower lobe consolidation and small left effusion. The possibility of aspiration pneumonitis cannot be excluded. 2. NG tube  tip just barely into stomach.   Electronically Signed   By: Skipper Cliche M.D.   On: 01/02/2014 20:35   Ct Head Wo Contrast  01/02/2014   CLINICAL DATA:  Seizure this afternoon, fell earlier today, history hyperlipidemia, schizoaffective disorder  EXAM: CT HEAD WITHOUT CONTRAST  CT CERVICAL SPINE WITHOUT CONTRAST  TECHNIQUE: Multidetector CT imaging of the head and cervical spine was performed following the standard protocol without intravenous contrast. Multiplanar CT image reconstructions of the cervical spine were also generated.  COMPARISON:  01/22/2007  FINDINGS: CT HEAD FINDINGS  Minimal atrophy.  Normal ventricular morphology.  No midline shift or mass effect.  Otherwise normal appearance of brain parenchyma.  No intracranial hemorrhage, mass lesion or evidence acute infarction.  No extra-axial fluid collections.  Bones and sinuses unremarkable.  CT CERVICAL SPINE FINDINGS  Skullbase intact.  Prevertebral soft  tissues normal thickness.  Nasogastric tube coiled in hypopharynx.  Vertebral body and disc space heights maintained.  No acute fracture, subluxation or bone destruction.  Soft tissues unremarkable.  IMPRESSION: No acute intracranial abnormalities.  No acute cervical spine abnormalities.   Electronically Signed   By: Lavonia Dana M.D.   On: 01/02/2014 18:07   Ct Cervical Spine Wo Contrast  01/02/2014   CLINICAL DATA:  Seizure this afternoon, fell earlier today, history hyperlipidemia, schizoaffective disorder  EXAM: CT HEAD WITHOUT CONTRAST  CT CERVICAL SPINE WITHOUT CONTRAST  TECHNIQUE: Multidetector CT imaging of the head and cervical spine was performed following the standard protocol without intravenous contrast. Multiplanar CT image reconstructions of the cervical spine were also generated.  COMPARISON:  01/22/2007  FINDINGS: CT HEAD FINDINGS  Minimal atrophy.  Normal ventricular morphology.  No midline shift or mass effect.  Otherwise normal appearance of brain parenchyma.  No intracranial hemorrhage, mass lesion or evidence acute infarction.  No extra-axial fluid collections.  Bones and sinuses unremarkable.  CT CERVICAL SPINE FINDINGS  Skullbase intact.  Prevertebral soft tissues normal thickness.  Nasogastric tube coiled in hypopharynx.  Vertebral body and disc space heights maintained.  No acute fracture, subluxation or bone destruction.  Soft tissues unremarkable.  IMPRESSION: No acute intracranial abnormalities.  No acute cervical spine abnormalities.   Electronically Signed   By: Lavonia Dana M.D.   On: 01/02/2014 18:07   Dg Pelvis Portable  01/02/2014   CLINICAL DATA:  Fall and seizures  EXAM: PORTABLE PELVIS 1-2 VIEWS  COMPARISON:  None.  FINDINGS: There is no evidence of pelvic fracture or diastasis. No other pelvic bone lesions are seen.  IMPRESSION: Negative.   Electronically Signed   By: Kathreen Devoid   On: 01/02/2014 17:54   Dg Chest Port 1 View  01/02/2014   CLINICAL DATA:  Left IJ central  venous catheter  EXAM: PORTABLE CHEST - 1 VIEW  COMPARISON:  Chest x-ray from the same day at 5:32 p.m.  FINDINGS: Improved positioning of orogastric tube, now reaching the stomach. ETT ends at the level of the mid thoracic trachea. New left IJ catheter, tip at the level of the upper SVC. No definitive pneumothorax. Streaky retrocardiac opacity appears stable from prior, most likely atelectasis based on intervening abdominal CT. Normal heart size.  IMPRESSION: 1. New left IJ catheter.  No adverse feature. 2. Improved positioning of orogastric tube. 3. Retrocardiac atelectasis.   Electronically Signed   By: Jorje Guild M.D.   On: 01/02/2014 22:35   Dg Chest Port 1 View  01/02/2014   CLINICAL DATA:  Fall, seizures  EXAM: PORTABLE  CHEST - 1 VIEW  COMPARISON:  None.  FINDINGS: There is an endotracheal tube with the tip 3.6 cm above the carina. There are low lung volumes. There is no focal consolidation, pleural effusion or pneumothorax. Normal cardiomediastinal silhouette. The osseous structures are unremarkable.  There is a nasogastric tube with the tip in the upper mid esophagus.  IMPRESSION: 1. Endotracheal tube with the tip 3.6 cm above the carina. 2. There is a nasogastric tube with the tip in the upper mid esophagus.   Electronically Signed   By: Kathreen Devoid   On: 01/02/2014 17:54      MDM   1. Status epilepticus   2. Altered mental status   3. Acute respiratory failure     48 year old male with no significant history of seizures comes in with chief complaint of seizure and unresponsive. Patient with fall earlier today decision to make this a little 1 from a due to this. Patient with no signs of external trauma patient GCS of 6 intubated for airway protection. Patient taken emergently to CT scanner no noted traumatic injury. Started on a propofol drip. I-STAT creatinine greater than 20. Lactic acidosis metabolic acidosis. Patient given fluids critical care consult for admission.     Deno Etienne, MD 01/02/14 [phone]

## 2014-01-02 NOTE — Progress Notes (Signed)
CT abdomen pelvis  Unreavealing.  No evidence of traumatic injury. No intraabdominal cause for metabolic derangements.

## 2014-01-02 NOTE — Progress Notes (Signed)
[place] Progress Note Patient Name: Brady Hoffman DOB: January 21, 1966 MRN: [id number]  Date of Service  01/02/2014   HPI/Events of Note  Pt with status epilepticus and respiratory failure and on vent. PCCM called for admission. Note SCr 20   eICU Interventions  Get renal consult Adm to ICU See orders Full PCCM H and P to follow   Intervention Category Major Interventions: Respiratory failure - evaluation and management;Seizures - evaluation and management  Asencion Noble 01/02/2014, 6:42 PM

## 2014-01-02 NOTE — ED Notes (Signed)
Pt to go to CT prior to transport to ICU.

## 2014-01-02 NOTE — Consult Note (Signed)
Renal Service Consult Note [facility]  Maxfield Gildersleeve 01/02/2014 Roney Jaffe D Requesting Physician:  Dr Joya Gaskins  Reason for Consult:  Renal failure, seizures HPI: The patient is a 48 y.o. year-old with hx of schzioaffective disorder, long-standing mental illness, lives in a group home.  He fell today and struck his head. Shortly thereafter he had a generalized seizure. Pt was unresponsive on EMS arrival.  In ED pt was intubated. CT scans of head were negative, pt seen by trauma and cleared to be admitted by CCM.  Labs returned a creatinine of 20.63, BUN 130 and K 5.7.  Serum CO2 5, ABG 7.17, pCO2 34.    Pt intubated and unable to provide any history.     Past chart review shows admits for psych commitment, UGIB, mild AKI resolving with fluids.   Past Medical History  Past Medical History  Diagnosis Date  . Hyperlipidemia   . Schizoaffective disorder   . H/O: GI bleed    Past Surgical History  Past Surgical History  Procedure Laterality Date  . Mole removal    . Wisdom tooth extraction    . Esophagogastroduodenoscopy  11/01/2011    Procedure: ESOPHAGOGASTRODUODENOSCOPY (EGD);  Surgeon: Beryle Beams;  Location: [facility] ENDOS[facility];  Service: Endoscopy;  Laterality: N/A;   Family History History reviewed. No pertinent family history. Social History  reports that he has never smoked. He has never used smokeless tobacco. He reports that he does not drink alcohol or use illicit drugs. Allergies  Allergies  Allergen Reactions  . Risperidone And Related     Pt. States head feels like a rock   Home medications Prior to Admission medications   Medication Sig Start Date End Date Taking? Authorizing Provider  atorvastatin (LIPITOR) 20 MG tablet Take 20 mg by mouth at bedtime.   Yes Historical Provider, MD  benztropine (COGENTIN) 1 MG tablet Take 2 mg by mouth at bedtime.   Yes Historical Provider, MD  Cholecalciferol (VITAMIN D3) 5000 UNITS TABS Take 5,000 Units by  mouth at bedtime.   Yes Historical Provider, MD  cloZAPine (CLOZARIL) 100 MG tablet Take 100-400 mg by mouth 2 (two) times daily. Take 1 tablet (100  Mg) twice daily at 8am and 1pm, take 4 tablets (400 mg) at bedtime   Yes Historical Provider, MD  docusate sodium (COLACE) 100 MG capsule Take 200 mg by mouth 2 (two) times daily.    Yes Historical Provider, MD  fluPHENAZine (PROLIXIN) 5 MG tablet Take 5 mg by mouth 2 (two) times daily.   Yes Historical Provider, MD  methylcellulose (CITRUCEL FIBER LAXATIVE) packet Take by mouth 3 (three) times daily as needed for constipation. Take as directed on label   Yes Historical Provider, MD  omeprazole (PRILOSEC) 40 MG capsule Take 40 mg by mouth daily.   Yes Historical Provider, MD  polyethylene glycol (MIRALAX / GLYCOLAX) packet Take 17 g by mouth daily. Mix 1 capful in 4-8 oz of water or juice by mouth daily   Yes Historical Provider, MD  senna (SENOKOT) 8.6 MG TABS tablet Take 2 tablets by mouth at bedtime as needed for mild constipation.   Yes Historical Provider, MD  venlafaxine XR (EFFEXOR-XR) 75 MG 24 hr capsule Take 225 mg by mouth daily.   Yes Historical Provider, MD   Liver Function Tests  Recent Labs Lab 01/02/14 1741  AST 17  ALT 45  ALKPHOS 89  BILITOT 0.3  PROT 6.9  ALBUMIN 3.5   No results found for this  basename: LIPASE, AMYLASE,  in the last 168 hours CBC  Recent Labs Lab 01/02/14 1741  WBC 13.6*  HGB 12.2*  HCT 33.2*  MCV 85.6  PLT 606   Basic Metabolic Panel  Recent Labs Lab 01/02/14 1741  NA 135*  K 5.7*  CL 89*  CO2 5*  GLUCOSE 217*  BUN 130*  CREATININE 20.63*  CALCIUM 8.7    Exam  Blood pressure 141/91, pulse 94, temperature 96.2 F (35.7 C), temperature source Rectal, resp. rate 16, height 6\' 2"  (1.88 m), SpO2 99.00%. On vent, not responding to commands, legs are shaking/jerking, does not look like seizure activity No rash, cyanosis or gangrene Sclera anicteric, throat w ETT No jvd, flat neck  veins Chest is clear bilat RRR no MRG ABd soft, nt, nd, no ascites or HSM GU foley in place with clear yellowish urine No LE or UE edema, pulses in feet intact, no fem bruits Neuro moving all ext, no response to commands, eyes closed  UA- mod Hgb, no prot, 3-6 rbcs, no wbc's CXR- clear lungs  Assessment/Plan: 1 Renal failure, presumably acute. Last creat in system 1.09 in 2012. Volume depleted, UA benign. +seizures, indication for RRT. Plan CRRT. See orders. Get renal US in am, urine lytes, give IVF"s, send off a few serologies.  2 Seizures 3 Schizoaffective disorder- on multiple psych meds 4 Vol depletion 5 Mild hyperkalemia  Kelly Splinter MD (pgr) [phone]    [phone] 01/02/2014, 8:15 PM

## 2014-01-02 NOTE — ED Notes (Signed)
Received call from [place], [facility] group home administrator.  Phone # [phone].

## 2014-01-03 ENCOUNTER — Inpatient Hospital Stay (HOSPITAL_COMMUNITY): Payer: Medicare Other

## 2014-01-03 DIAGNOSIS — G40401 Other generalized epilepsy and epileptic syndromes, not intractable, with status epilepticus: Principal | ICD-10-CM

## 2014-01-03 DIAGNOSIS — N179 Acute kidney failure, unspecified: Secondary | ICD-10-CM

## 2014-01-03 DIAGNOSIS — R4182 Altered mental status, unspecified: Secondary | ICD-10-CM

## 2014-01-03 DIAGNOSIS — R569 Unspecified convulsions: Secondary | ICD-10-CM

## 2014-01-03 DIAGNOSIS — J9600 Acute respiratory failure, unspecified whether with hypoxia or hypercapnia: Secondary | ICD-10-CM

## 2014-01-03 LAB — BLOOD GAS, ARTERIAL
ACID-BASE DEFICIT: 6 mmol/L — AB (ref 0.0–2.0)
BICARBONATE: 17.5 meq/L — AB (ref 20.0–24.0)
Drawn by: 362771
FIO2: 0.4 %
MECHVT: 650 mL
O2 Saturation: 99.2 %
PATIENT TEMPERATURE: 97.2
PEEP: 5 cmH2O
PH ART: 7.443 (ref 7.350–7.450)
RATE: 16 resp/min
TCO2: 18.3 mmol/L (ref 0–100)
pCO2 arterial: 25.7 mmHg — ABNORMAL LOW (ref 35.0–45.0)
pO2, Arterial: 204 mmHg — ABNORMAL HIGH (ref 80.0–100.0)

## 2014-01-03 LAB — HEPATITIS PANEL, ACUTE
HCV AB: NEGATIVE
HEP B C IGM: NONREACTIVE
Hep A IgM: NONREACTIVE
Hepatitis B Surface Ag: NEGATIVE

## 2014-01-03 LAB — RENAL FUNCTION PANEL
ALBUMIN: 3 g/dL — AB (ref 3.5–5.2)
ALBUMIN: 2.8 g/dL — AB (ref 3.5–5.2)
BUN: 89 mg/dL — AB (ref 6–23)
BUN: 54 mg/dL — AB (ref 6–23)
CHLORIDE: 104 meq/L (ref 96–112)
CO2: 17 mEq/L — ABNORMAL LOW (ref 19–32)
CO2: 21 meq/L (ref 19–32)
CREATININE: 8.13 mg/dL — AB (ref 0.50–1.35)
Calcium: 7.7 mg/dL — ABNORMAL LOW (ref 8.4–10.5)
Calcium: 7.7 mg/dL — ABNORMAL LOW (ref 8.4–10.5)
Chloride: 101 mEq/L (ref 96–112)
Creatinine, Ser: 13.26 mg/dL — ABNORMAL HIGH (ref 0.50–1.35)
GFR calc Af Amer: 4 mL/min — ABNORMAL LOW (ref >90)
GFR calc Af Amer: 8 mL/min — ABNORMAL LOW (ref >90)
GFR calc non Af Amer: 7 mL/min — ABNORMAL LOW (ref >90)
GFR, EST NON AFRICAN AMERICAN: 4 mL/min — AB (ref >90)
GLUCOSE: 82 mg/dL (ref 70–99)
Glucose, Bld: 88 mg/dL (ref 70–99)
PHOSPHORUS: 6.3 mg/dL — AB (ref 2.3–4.6)
POTASSIUM: 4.2 meq/L (ref 3.7–5.3)
POTASSIUM: 3.9 meq/L (ref 3.7–5.3)
Phosphorus: 4.3 mg/dL (ref 2.3–4.6)
SODIUM: 140 meq/L (ref 137–147)
Sodium: 143 mEq/L (ref 137–147)

## 2014-01-03 LAB — CBC
HCT: 27.8 % — ABNORMAL LOW (ref 39.0–52.0)
HCT: 27.7 % — ABNORMAL LOW (ref 39.0–52.0)
HEMOGLOBIN: 10.4 g/dL — AB (ref 13.0–17.0)
Hemoglobin: 10.3 g/dL — ABNORMAL LOW (ref 13.0–17.0)
MCH: 31.1 pg (ref 26.0–34.0)
MCH: 31.1 pg (ref 26.0–34.0)
MCHC: 37.4 g/dL — ABNORMAL HIGH (ref 30.0–36.0)
MCHC: 37.2 g/dL — ABNORMAL HIGH (ref 30.0–36.0)
MCV: 83.2 fL (ref 78.0–100.0)
MCV: 83.7 fL (ref 78.0–100.0)
Platelets: 135 10*3/uL — ABNORMAL LOW (ref 150–400)
Platelets: 114 10*3/uL — ABNORMAL LOW (ref 150–400)
RBC: 3.34 MIL/uL — ABNORMAL LOW (ref 4.22–5.81)
RBC: 3.31 MIL/uL — ABNORMAL LOW (ref 4.22–5.81)
RDW: 12.5 % (ref 11.5–15.5)
RDW: 12.6 % (ref 11.5–15.5)
WBC: 6.6 10*3/uL (ref 4.0–10.5)
WBC: 5 10*3/uL (ref 4.0–10.5)

## 2014-01-03 LAB — URINE MICROSCOPIC-ADD ON

## 2014-01-03 LAB — APTT: APTT: 31 s (ref 24–37)

## 2014-01-03 LAB — URINALYSIS, ROUTINE W REFLEX MICROSCOPIC
Bilirubin Urine: NEGATIVE
Glucose, UA: NEGATIVE mg/dL
Ketones, ur: NEGATIVE mg/dL
Nitrite: NEGATIVE
PH: 6 (ref 5.0–8.0)
PROTEIN: NEGATIVE mg/dL
Specific Gravity, Urine: 1.009 (ref 1.005–1.030)
Urobilinogen, UA: 0.2 mg/dL (ref 0.0–1.0)

## 2014-01-03 LAB — GLUCOSE, CAPILLARY
GLUCOSE-CAPILLARY: 110 mg/dL — AB (ref 70–99)
Glucose-Capillary: 76 mg/dL (ref 70–99)
Glucose-Capillary: 85 mg/dL (ref 70–99)
Glucose-Capillary: 74 mg/dL (ref 70–99)
Glucose-Capillary: 84 mg/dL (ref 70–99)
Glucose-Capillary: 76 mg/dL (ref 70–99)

## 2014-01-03 LAB — PHOSPHORUS: Phosphorus: 4.3 mg/dL (ref 2.3–4.6)

## 2014-01-03 LAB — TROPONIN I
Troponin I: <0.3 ng/mL (ref <0.30)
Troponin I: <0.3 ng/mL (ref <0.30)

## 2014-01-03 LAB — DIC (DISSEMINATED INTRAVASCULAR COAGULATION) PANEL
APTT: 32 s (ref 24–37)
FIBRINOGEN: 455 mg/dL (ref 204–475)
PLATELETS: 121 10*3/uL — AB (ref 150–400)
SMEAR REVIEW: NONE SEEN

## 2014-01-03 LAB — OSMOLALITY: OSMOLALITY: 303 mosm/kg — AB (ref 275–300)

## 2014-01-03 LAB — PROCALCITONIN: Procalcitonin: 0.45 ng/mL

## 2014-01-03 LAB — MAGNESIUM
Magnesium: 3.1 mg/dL — ABNORMAL HIGH (ref 1.5–2.5)
Magnesium: 2.7 mg/dL — ABNORMAL HIGH (ref 1.5–2.5)

## 2014-01-03 LAB — DIC (DISSEMINATED INTRAVASCULAR COAGULATION)PANEL
D-Dimer, Quant: 5.72 ug/mL-FEU — ABNORMAL HIGH (ref 0.00–0.48)
INR: 1.15 (ref 0.00–1.49)
Prothrombin Time: 14.5 seconds (ref 11.6–15.2)

## 2014-01-03 LAB — ALCOHOL, METHYL (METHANOL), BLOOD: METHANOL LVL: NEGATIVE

## 2014-01-03 LAB — OCCULT BLOOD GASTRIC / DUODENUM (SPECIMEN CUP): Occult Blood, Gastric: POSITIVE — AB

## 2014-01-03 LAB — HIV ANTIBODY (ROUTINE TESTING W REFLEX): HIV: NONREACTIVE

## 2014-01-03 MED ORDER — SODIUM CHLORIDE 0.9 % IV SOLN
1.0000 mg/h | INTRAVENOUS | Status: DC
  Administered 2014-01-03 – 2014-01-04 (×3): 4 mg/h via INTRAVENOUS
  Administered 2014-01-04 – 2014-01-05 (×2): 2 mg/h via INTRAVENOUS
  Administered 2014-01-05 (×2): 4 mg/h via INTRAVENOUS
  Filled 2014-01-03 (×7): qty 10

## 2014-01-03 MED ORDER — PYRIDOXINE HCL 100 MG/ML IJ SOLN
100.0000 mg | Freq: Every day | INTRAMUSCULAR | Status: DC
  Administered 2014-01-03: 100 mg via INTRAVENOUS
  Filled 2014-01-03 (×2): qty 1

## 2014-01-03 MED ORDER — BIOTENE DRY MOUTH MT LIQD
15.0000 mL | Freq: Four times a day (QID) | OROMUCOSAL | Status: DC
  Administered 2014-01-03 – 2014-01-07 (×19): 15 mL via OROMUCOSAL

## 2014-01-03 MED ORDER — HYDRALAZINE HCL 20 MG/ML IJ SOLN
10.0000 mg | INTRAMUSCULAR | Status: DC
Start: 2014-01-03 — End: 2014-01-07
  Administered 2014-01-03 – 2014-01-07 (×7): 10 mg via INTRAVENOUS
  Filled 2014-01-03 (×4): qty 0.5
  Filled 2014-01-03: qty 1
  Filled 2014-01-03 (×2): qty 0.5
  Filled 2014-01-03: qty 1
  Filled 2014-01-03 (×2): qty 0.5
  Filled 2014-01-03: qty 1
  Filled 2014-01-03 (×16): qty 0.5

## 2014-01-03 MED ORDER — FOLIC ACID 5 MG/ML IJ SOLN
1.0000 mg | Freq: Every day | INTRAMUSCULAR | Status: DC
  Administered 2014-01-03 – 2014-01-06 (×4): 1 mg via INTRAVENOUS
  Filled 2014-01-03 (×5): qty 0.2

## 2014-01-03 MED ORDER — VITAL HIGH PROTEIN PO LIQD
1000.0000 mL | ORAL | Status: DC
  Filled 2014-01-03 (×2): qty 1000

## 2014-01-03 MED ORDER — SODIUM CHLORIDE 0.9 % IV SOLN
15.0000 mg/kg | Freq: Once | INTRAVENOUS | Status: AC
  Administered 2014-01-03: 1400 mg via INTRAVENOUS
  Filled 2014-01-03: qty 1.4

## 2014-01-03 MED ORDER — VITAL AF 1.2 CAL PO LIQD
1000.0000 mL | ORAL | Status: DC
Start: 2014-01-03 — End: 2014-01-04
  Filled 2014-01-03: qty 1000

## 2014-01-03 MED ORDER — "THROMBI-PAD 3""X3"" EX PADS"
1.0000 | MEDICATED_PAD | Freq: Once | CUTANEOUS | Status: AC
  Administered 2014-01-03: 1 via TOPICAL
  Filled 2014-01-03: qty 1

## 2014-01-03 MED ORDER — CHLORHEXIDINE GLUCONATE 0.12 % MT SOLN
15.0000 mL | Freq: Two times a day (BID) | OROMUCOSAL | Status: DC
  Administered 2014-01-03 – 2014-01-07 (×11): 15 mL via OROMUCOSAL
  Filled 2014-01-03 (×10): qty 15

## 2014-01-03 MED ORDER — SODIUM CHLORIDE 0.9 % IV SOLN
10.0000 mg/kg | INTRAVENOUS | Status: DC
  Administered 2014-01-03 – 2014-01-04 (×3): 930 mg via INTRAVENOUS
  Filled 2014-01-03 (×10): qty 0.93

## 2014-01-03 MED ORDER — THIAMINE HCL 100 MG/ML IJ SOLN
100.0000 mg | Freq: Every day | INTRAMUSCULAR | Status: DC
  Administered 2014-01-03: 100 mg via INTRAVENOUS
  Filled 2014-01-03 (×2): qty 1

## 2014-01-03 NOTE — Progress Notes (Addendum)
Name: Brady Hoffman MRN: [id number] DOB: [birth year]    ADMISSION DATE:  01/02/2014 CONSULTATION DATE: 01/02/14  PRIMARY SERVICE: PCCM   CHIEF COMPLAINT: Altered mental status, Seizure, Acute renal failure   BRIEF PATIENT DESCRIPTION:  48 years old male with Schizoaffective disorder, resident of a group home. Was in his usual state of health and sustained a fall with head trauma. He then had a generalized seizure. At arrival to the ED on acute renal failure, intubated for airway protection.   SIGNIFICANT EVENTS / STUDIES:  2/1 CT abd/pelvis - unremarkable; LLL consolidation 2/1 CT Head - no acute intracranial abnormalities 2/1 CT C-spine - no acute cervical spine abnormalities  LINES / TUBES:  PIV 2/1>>> LIJ 2/1>>> R Femoral dialysis catheter 2/1>>> ETT 2/1>>> OGT 2/1>>> Foley 2/1>>>  CULTURES:  MRSA negative Blood 2/1 >>> Urine 2/1>>>  ANTIBIOTICS:  Zosyn 2/2>>>  SUBJECTIVE: cvvhd continues  VITAL SIGNS: Temp:  [95 F (35 C)-98.1 F (36.7 C)] 97.3 F (36.3 C) (02/02 0829) Pulse Rate:  [57-118] 72 (02/02 0817) Resp:  [11-43] 16 (02/02 0817) BP: (129-158)/(81-91) 158/90 mmHg (02/02 0700) SpO2:  [97 %-100 %] 100 % (02/02 0817) FiO2 (%):  [40 %-100 %] 40 % (02/02 0817) Weight:  [88.2 kg (194 lb 7.1 oz)-96 kg (211 lb 10.3 oz)] 93 kg (205 lb 0.4 oz) (02/02 0400) HEMODYNAMICS:   VENTILATOR SETTINGS: Vent Mode:  [-] PRVC FiO2 (%):  [40 %-100 %] 40 % Set Rate:  [16 bmp] 16 bmp Vt Set:  [650 mL] 650 mL PEEP:  [5 cmH20] 5 cmH20 Plateau Pressure:  [12 cmH20-15 cmH20] 15 cmH20 INTAKE / OUTPUT: Intake/Output     02/01 0701 - 02/02 0700 02/02 0701 - 02/03 0700   I.V. (mL/kg) 1432.6 (15.4)    NG/GT 70    IV Piggyback 475    Total Intake(mL/kg) 1977.6 (21.3)    Urine (mL/kg/hr) 605    Emesis/NG output 100    Other 25    Total Output 730 0   Net +1247.6 0          PHYSICAL EXAMINATION: General: Sedated, intubated, no acute distress.  Eyes: Anicteric  sclerae. Pupils are  ENT: ETT in place. Trachea at midline.  Lymph: No cervical, supraclavicular, or axillary lymphadenopathy.  Heart: Normal S1, S2. No murmurs, rubs, or gallops appreciated. No bruits, equal pulses.  Lungs: Normal excursion, no dullness to percussion. Good air movement bilaterally, without wheezes or crackles.  Abdomen: Abdomen soft, non-tender and not distended, normoactive bowel sounds. No hepatosplenomegaly or masses.  Musculoskeletal: No clubbing or synovitis. No LE edema  Skin: No rashes or lesions  Neuro: Patient is unresponsive  LABS:  CBC  Recent Labs Lab 01/02/14 1741 01/03/14 0423  WBC 13.6* 6.6  HGB 12.2* 10.4*  HCT 33.2* 27.8*  PLT 191 135*   Coag's  Recent Labs Lab 01/02/14 1741 01/03/14 0423  APTT 28 31  INR 1.32  --    BMET  Recent Labs Lab 01/02/14 1741 01/03/14 0423  NA 135* 140  K 5.7* 4.2  CL 89* 101  CO2 5* 17*  BUN 130* 89*  CREATININE 20.63* 13.26*  GLUCOSE 217* 88   Electrolytes  Recent Labs Lab 01/02/14 1741 01/02/14 2205 01/03/14 0423  CALCIUM 8.7  --  7.7*  MG  --   --  3.1*  PHOS  --  10.2* 6.3*   Sepsis Markers  Recent Labs Lab 01/02/14 1755  LATICACIDVEN 10.98*   ABG  Recent Labs Lab 01/02/14  1809 01/03/14 0340  PHART 7.179* 7.443  PCO2ART 34.3* 25.7*  PO2ART 476.0* 204.0*   Liver Enzymes  Recent Labs Lab 01/02/14 1741 01/03/14 0423  AST 17  --   ALT 45  --   ALKPHOS 89  --   BILITOT 0.3  --   ALBUMIN 3.5 3.0*   Cardiac Enzymes  Recent Labs Lab 01/02/14 2205 01/03/14 0125  TROPONINI <0.30 <0.30   Glucose  Recent Labs Lab 01/02/14 1738 01/02/14 2038 01/02/14 2351 01/03/14 0355 01/03/14 0747  GLUCAP 191* 147* 110* 76 85    Imaging Ct Abdomen Pelvis Wo Contrast  01/02/2014   CLINICAL DATA:  Patient fell approximately 8 hr ago and hit head, developed seizure and mental status changes, intubated  EXAM: CT ABDOMEN AND PELVIS WITHOUT CONTRAST  TECHNIQUE: Multidetector CT  imaging of the abdomen and pelvis was performed following the standard protocol without intravenous contrast.  COMPARISON:  None.  FINDINGS: There is left lower lobe consolidation. There is likely a small left effusion. There is beam artifact across lung base from positioning of the patient's arms. An NG tube extends with its tip just barely into the stomach.  Liver, spleen, pancreas, kidneys, and adrenal glands are normal. There are gallstones. Aorta is not dilated. Bowel is normal. No ascites. There is a Foley balloon catheter in the bladder. Reproductive organs are normal. No acute musculoskeletal findings.  IMPRESSION: 1. Left lower lobe consolidation and small left effusion. The possibility of aspiration pneumonitis cannot be excluded. 2. NG tube tip just barely into stomach.   Electronically Signed   By: Skipper Cliche M.D.   On: 01/02/2014 20:35   Ct Head Wo Contrast  01/02/2014   CLINICAL DATA:  Seizure this afternoon, fell earlier today, history hyperlipidemia, schizoaffective disorder  EXAM: CT HEAD WITHOUT CONTRAST  CT CERVICAL SPINE WITHOUT CONTRAST  TECHNIQUE: Multidetector CT imaging of the head and cervical spine was performed following the standard protocol without intravenous contrast. Multiplanar CT image reconstructions of the cervical spine were also generated.  COMPARISON:  01/22/2007  FINDINGS: CT HEAD FINDINGS  Minimal atrophy.  Normal ventricular morphology.  No midline shift or mass effect.  Otherwise normal appearance of brain parenchyma.  No intracranial hemorrhage, mass lesion or evidence acute infarction.  No extra-axial fluid collections.  Bones and sinuses unremarkable.  CT CERVICAL SPINE FINDINGS  Skullbase intact.  Prevertebral soft tissues normal thickness.  Nasogastric tube coiled in hypopharynx.  Vertebral body and disc space heights maintained.  No acute fracture, subluxation or bone destruction.  Soft tissues unremarkable.  IMPRESSION: No acute intracranial abnormalities.  No  acute cervical spine abnormalities.   Electronically Signed   By: Lavonia Dana M.D.   On: 01/02/2014 18:07   Ct Cervical Spine Wo Contrast  01/02/2014   CLINICAL DATA:  Seizure this afternoon, fell earlier today, history hyperlipidemia, schizoaffective disorder  EXAM: CT HEAD WITHOUT CONTRAST  CT CERVICAL SPINE WITHOUT CONTRAST  TECHNIQUE: Multidetector CT imaging of the head and cervical spine was performed following the standard protocol without intravenous contrast. Multiplanar CT image reconstructions of the cervical spine were also generated.  COMPARISON:  01/22/2007  FINDINGS: CT HEAD FINDINGS  Minimal atrophy.  Normal ventricular morphology.  No midline shift or mass effect.  Otherwise normal appearance of brain parenchyma.  No intracranial hemorrhage, mass lesion or evidence acute infarction.  No extra-axial fluid collections.  Bones and sinuses unremarkable.  CT CERVICAL SPINE FINDINGS  Skullbase intact.  Prevertebral soft tissues normal thickness.  Nasogastric tube  coiled in hypopharynx.  Vertebral body and disc space heights maintained.  No acute fracture, subluxation or bone destruction.  Soft tissues unremarkable.  IMPRESSION: No acute intracranial abnormalities.  No acute cervical spine abnormalities.   Electronically Signed   By: Lavonia Dana M.D.   On: 01/02/2014 18:07   Dg Pelvis Portable  01/02/2014   CLINICAL DATA:  Fall and seizures  EXAM: PORTABLE PELVIS 1-2 VIEWS  COMPARISON:  None.  FINDINGS: There is no evidence of pelvic fracture or diastasis. No other pelvic bone lesions are seen.  IMPRESSION: Negative.   Electronically Signed   By: Kathreen Devoid   On: 01/02/2014 17:54   Dg Chest Port 1 View  01/03/2014   CLINICAL DATA:  Check endotracheal tube position.  EXAM: PORTABLE CHEST - 1 VIEW  COMPARISON:  01/02/2014  FINDINGS: Endotracheal tube tip lies 5.3 cm above the carina. Left internal jugular central venous line and nasogastric tube are stable. There is mild medial lung base atelectasis.  The lungs are otherwise clear. Cardiac silhouette is normal in size. Normal mediastinal contour.  IMPRESSION: Support apparatus is stable and well positioned.  Mild medial lung base atelectasis, increased from previous day's study.   Electronically Signed   By: Lajean Manes M.D.   On: 01/03/2014 07:11   Dg Chest Port 1 View  01/02/2014   CLINICAL DATA:  Left IJ central venous catheter  EXAM: PORTABLE CHEST - 1 VIEW  COMPARISON:  Chest x-ray from the same day at 5:32 p.m.  FINDINGS: Improved positioning of orogastric tube, now reaching the stomach. ETT ends at the level of the mid thoracic trachea. New left IJ catheter, tip at the level of the upper SVC. No definitive pneumothorax. Streaky retrocardiac opacity appears stable from prior, most likely atelectasis based on intervening abdominal CT. Normal heart size.  IMPRESSION: 1. New left IJ catheter.  No adverse feature. 2. Improved positioning of orogastric tube. 3. Retrocardiac atelectasis.   Electronically Signed   By: Jorje Guild M.D.   On: 01/02/2014 22:35   Dg Chest Port 1 View  01/02/2014   CLINICAL DATA:  Fall, seizures  EXAM: PORTABLE CHEST - 1 VIEW  COMPARISON:  None.  FINDINGS: There is an endotracheal tube with the tip 3.6 cm above the carina. There are low lung volumes. There is no focal consolidation, pleural effusion or pneumothorax. Normal cardiomediastinal silhouette. The osseous structures are unremarkable.  There is a nasogastric tube with the tip in the upper mid esophagus.  IMPRESSION: 1. Endotracheal tube with the tip 3.6 cm above the carina. 2. There is a nasogastric tube with the tip in the upper mid esophagus.   Electronically Signed   By: Kathreen Devoid   On: 01/02/2014 17:54   CXR:  LLL consolidation? Unimpressed, ett wnl  ASSESSMENT / PLAN:   PULMONARY  A:  Aspiration pneumonia  Acute resp failure P:  Continue mechanical ventilation Reduce MV (rate, not breathing over), abg in am  wua  / SBT goal cpap5 ps 5 2  hrs unlikely to extubate Poor neuro for extubation   CARDIOVASCULAR  A:  Bradycardia (on propofol) Hypertensive with no known history Hyperlipidemia P:  Continue IVF, NS'@150'  May need dopamine if brady continues and symptomatic, consider dc or lower propofol (brady) - see neuro Hold statin for now May need addition hydralazine   RENAL  A:  Acute renal failure (no known renal disease) - SCr 38.17>>71.16 AG metabolic acidosis (57>> 22) -- improved from admission Hyperkalemia - resolved  CK, Total - 462 Lactic acid 91.47 Salicylate level WNL Tylenol level WNL UA benign Massive Ag acidosis, moderate met alk ( contracture) P:  Nephrology following Continue CRRT  F/u HIV, C3, C4, hepatitis, ANA Continue NS BID Renal panel per nephro Daily Mg level UDS pending Add renal US   GASTROINTESTINAL  A:  SUP P:  Continue Protonix  Surgery states no intraabdominal cause for metabolic derangements, CT abd reviewed Add TF  HEMATOLOGIC  A:  Anemia  DVT ppx P:  Follow CBC HSQ  INFECTIOUS  A:  Aspiration pneumonia ?? Unimpressed pcxr Afebrile, no leukocytosis P:  Follow cultures Trend fever curve Continue zosyn, dose adjust  Pct algo to dc abx  ENDOCRINE  A:  Hyperglycemia - controlled P:  Continue Novolog SSI per ICU protocol Follow CBG  NEUROLOGIC  A:  Schizoaffective disorder Seizure s/p fall with head trauma AMS (post ictal vs uremia) ?Rigidity per mother - diff  = seizures, dyskinesia, early NMS R/o EG tox, suicide OD? P:  Neurology consulted Continue Keppra 500 mg IV BID F/u UDS Propofol for sedation, may nee to lower Home psych meds on hold for now: clozapine, fluphenazine, venlafaxine, benztropine Clozapine can lower sz threshold and cause muscle rigidity (seen with fluphenazine also)- rare Assess eeg If fevers, consider dantrolene Assess osm gap -25 ( REGARDLESS WITH arf, OSM GAP LIKELY GONE AND LEFT IS eg METABOLITES CAUSING ag = START EMPIRIC  FOMEPIZOL EMPIRIC STAT ENSURE EG, METHANOL LEVELS SENT  SEND ETOH LEVEL Add B vitamins pyridoxine, thiamine Add versed drip Resend urine for crystals  TODAY'S SUMMARY: CVVHD, no weaning, add empiric fomepizole STAT, B vit Family updated  I have personally obtained a history, examined the patient, evaluated laboratory and imaging results, formulated the assessment and plan and placed orders. CRITICAL CARE: The patient is critically ill with multiple organ systems failure and requires high complexity decision making for assessment and support, frequent evaluation and titration of therapies, application of advanced monitoring technologies and extensive interpretation of multiple databases. Critical Care Time devoted to patient care services described in this note is 85 minutes.   Lavon Paganini. Titus Mould, MD, FACP Pgr: [facility] [facility]  Pulmonary and [place] Pager: [phone]  01/03/2014, 9:40 AM

## 2014-01-03 NOTE — Progress Notes (Signed)
INITIAL NUTRITION ASSESSMENT  DOCUMENTATION CODES Per approved criteria  -Not Applicable   INTERVENTION:  Utilize 95M PEPuP Protocol: initiate TF via OGT with Vital AF 1.2 at 25 ml/h on day 1; on day 2, increase to goal rate of 70 ml/h (1680 ml per day) to provide 2016 kcals, 126 gm protein, 1362 ml free water daily.  NUTRITION DIAGNOSIS: Inadequate oral intake related to inability to eat as evidenced by NPO status.   Goal: Intake to meet >90% of estimated nutrition needs.  Monitor:  TF tolerance/adequacy, weight trend, labs, vent status.  Reason for Assessment: MD Consult for TF initiation and management.   48 y.o. male  Admitting Dx: Status epilepticus  ASSESSMENT: Patient with Schizoaffective disorder, resident of a group home. Was in his usual state of health and sustained a fall with head trauma. He then had a generalized seizure. At arrival to the ED had acute renal failure, intubated for airway protection.   Discussed patient in ICU rounds today. Received MD Consult for TF initiation and management.  Patient is currently intubated on ventilator support.  MV: 10.6 L/min Temp (24hrs), Avg:96.2 F (35.7 C), Min:95 F (35 C), Max:98.1 F (36.7 C)  Propofol: off  Height: Ht Readings from Last 1 Encounters:  01/02/14 6\' 2"  (1.88 m)    Weight: Wt Readings from Last 1 Encounters:  01/03/14 205 lb 0.4 oz (93 kg)    Ideal Body Weight: 86.4 kg  % Ideal Body Weight: 108%  Wt Readings from Last 10 Encounters:  01/03/14 205 lb 0.4 oz (93 kg)  10/31/11 211 lb 10.3 oz (96 kg)  10/31/11 211 lb 10.3 oz (96 kg)    Usual Body Weight: 211 lb (> 2 years ago)  % Usual Body Weight: 97%  BMI:  Body mass index is 26.31 kg/(m^2).  Estimated Nutritional Needs: Kcal: 2050 Protein: 110-130 gm Fluid: 2.1-2.2 L  Skin: no wounds  Diet Order: NPO  EDUCATION NEEDS: -Education not appropriate at this time   Intake/Output Summary (Last 24 hours) at 01/03/14 1308 Last  data filed at 01/03/14 1230  Gross per 24 hour  Intake 1977.64 ml  Output    807 ml  Net 1170.64 ml    Last BM: unknown   Labs:   Recent Labs Lab 01/02/14 1741 01/02/14 2205 01/03/14 0423  NA 135*  --  140  K 5.7*  --  4.2  CL 89*  --  101  CO2 5*  --  17*  BUN 130*  --  89*  CREATININE 20.63*  --  13.26*  CALCIUM 8.7  --  7.7*  MG  --   --  3.1*  PHOS  --  10.2* 6.3*  GLUCOSE 217*  --  88    CBG (last 3)   Recent Labs  01/03/14 0355 01/03/14 0747 01/03/14 1210  GLUCAP 76 85 74    Scheduled Meds: . antiseptic oral rinse  15 mL Mouth Rinse QID  . chlorhexidine  15 mL Mouth Rinse BID  . feeding supplement (VITAL HIGH PROTEIN)  1,000 mL Per Tube Q24H  . folic acid  1 mg Intravenous Daily  . fomepizole (ANTIZOL) IV  15 mg/kg Intravenous Once  . fomepizole (ANTIZOL) IV  10 mg/kg Intravenous Q4H  . heparin  5,000 Units Subcutaneous Q8H  . hydrALAZINE  10 mg Intravenous Q4H  . insulin aspart  1-3 Units Subcutaneous Q4H  . levETIRAcetam  500 mg Intravenous Q12H  . pantoprazole (PROTONIX) IV  40 mg Intravenous QHS  .  piperacillin-tazobactam  3.375 g Intravenous Q6H  . pyridOXINE  100 mg Intravenous Daily  . sodium chloride  125 mL Intravenous Once  . thiamine  100 mg Intravenous Daily    Continuous Infusions: . sodium chloride 100 mL/hr at 01/03/14 0906  . sodium chloride    . sodium chloride 125 mL/hr (01/02/14 1735)  . midazolam (VERSED) infusion 4 mg/hr (01/03/14 1240)  . dialysate (PRISMASATE) 2,000 mL/hr at 01/03/14 1047  . dialysis replacement fluid (prismasate) 400 mL/hr at 01/02/14 2211  . dialysis replacement fluid (prismasate) 200 mL/hr at 01/02/14 2212  . propofol 55 mcg/kg/min (01/03/14 0418)    Past Medical History  Diagnosis Date  . Hyperlipidemia   . Schizoaffective disorder   . H/O: GI bleed     Past Surgical History  Procedure Laterality Date  . Mole removal    . Wisdom tooth extraction    . Esophagogastroduodenoscopy  11/01/2011     Procedure: ESOPHAGOGASTRODUODENOSCOPY (EGD);  Surgeon: Beryle Beams;  Location: [facility];  Service: Endoscopy;  Laterality: N/A;     Molli Barrows, RD, LDN, [place] Pager [phone] After Hours Pager [phone]

## 2014-01-03 NOTE — Consult Note (Signed)
NEURO HOSPITALIST CONSULT NOTE    Reason for Consult: seizure, AMS  HPI:                                                                                                                                          Brady Hoffman is an 48 y.o. male with a past medical history significant for schzioaffective disorder,  lives in a group home, admitted to  [facility] after sustaining a fall with head trauma and subsequent GTC seizure. Upon arrival to the ED he was found to be on acute renal failure (Cr 20) with metabolic acidosis and elevated WBC Persistent altered mental status prompted intubation for airway protection. CT brain showed no acute abnormality or other abnormality that could explain his seizure. Patient had an isolated febrile seizure as a child but never seizures during adulthood.  Serologies: normal white count, Na 140, negative methanol, ethylene glycol pending, phosphorus 10.2,  acetaminophen <15,  CK 462.    Past Medical History  Diagnosis Date  . Hyperlipidemia   . Schizoaffective disorder   . H/O: GI bleed     Past Surgical History  Procedure Laterality Date  . Mole removal    . Wisdom tooth extraction    . Esophagogastroduodenoscopy  11/01/2011    Procedure: ESOPHAGOGASTRODUODENOSCOPY (EGD);  Surgeon: Beryle Beams;  Location: [facility];  Service: Endoscopy;  Laterality: N/A;    History reviewed. No pertinent family history.   Social History:  reports that he has never smoked. He has never used smokeless tobacco. He reports that he does not drink alcohol or use illicit drugs.  Allergies  Allergen Reactions  . Risperidone And Related     Pt. States head feels like a rock    MEDICATIONS:                                                                                                                     I have reviewed the patient's current medications.   ROS: unable to obtain.  History obtained from chart review and family.  Physical exam: intubated on the vent. Blood pressure 166/94, pulse 64, temperature 97.3 F (36.3 C), temperature source Oral, resp. rate 16, height '6\' 2"'  (1.88 m), weight 93 kg (205 lb 0.4 oz), SpO2 100.00%. Head: normocephalic. Neck: supple, no bruits, no JVD. Cardiac: no murmurs. Lungs: clear. Abdomen: soft, no tender, no mass. Extremities: no edema.  Neurologic Examination:                                                                                                      Mental Status: Intubated on the vent Cranial Nerves: Pupils 3-4 mm bilaterally, reactive. No gaze preference. EOM full without nystagmus. Face appears to be symmetric. Tongue: intubated. Motor: No motor response on painful stimuli Tone: increased all over. Patient has dystonic-like movements upon stimulation Sensory: Pinprick and light touch intact throughout, bilaterally Deep Tendon Reflexes:  2 all over. Plantars: Right: downgoing   Left: downgoing Cerebellar: Unable to test. Gait:  No tested CV: pulses palpable throughout    Lab Results  Component Value Date/Time   CHOL 139 10/20/2009  8:54 PM    Results for orders placed during the hospital encounter of 01/02/14 (from the past 48 hour(s))  SAMPLE TO BLOOD BANK     Status: None   Collection Time    01/02/14  5:20 PM      Result Value Range   Blood Bank Specimen SAMPLE AVAILABLE FOR TESTING     Sample Expiration 01/03/2014    GLUCOSE, CAPILLARY     Status: Abnormal   Collection Time    01/02/14  5:38 PM      Result Value Range   Glucose-Capillary 191 (*) 70 - 99 mg/dL   Comment 1 Notify RN     Comment 2 Documented in Chart    CDS SEROLOGY     Status: None   Collection Time    01/02/14  5:41 PM      Result Value Range   CDS serology specimen       Value: SPECIMEN WILL BE HELD FOR 14 DAYS IF TESTING IS REQUIRED   COMPREHENSIVE METABOLIC PANEL     Status: Abnormal   Collection Time    01/02/14  5:41 PM      Result Value Range   Sodium 135 (*) 137 - 147 mEq/L   Potassium 5.7 (*) 3.7 - 5.3 mEq/L   Chloride 89 (*) 96 - 112 mEq/L   CO2 5 (*) 19 - 32 mEq/L   Comment: CRITICAL RESULT CALLED TO, READ BACK BY AND VERIFIED WITH:     KYKER A,RN 1820 01/02/14 SCALES H   Glucose, Bld 217 (*) 70 - 99 mg/dL   BUN 130 (*) 6 - 23 mg/dL   Creatinine, Ser 20.63 (*) 0.50 - 1.35 mg/dL   Calcium 8.7  8.4 - 10.5 mg/dL   Total Protein 6.9  6.0 - 8.3 g/dL   Albumin 3.5  3.5 - 5.2 g/dL   AST 17  0 - 37 U/L   ALT 45  0 - 53 U/L  Alkaline Phosphatase 89  39 - 117 U/L   Total Bilirubin 0.3  0.3 - 1.2 mg/dL   GFR calc non Af Amer 2 (*) >90 mL/min   GFR calc Af Amer 3 (*) >90 mL/min   Comment: (NOTE)     The eGFR has been calculated using the CKD EPI equation.     This calculation has not been validated in all clinical situations.     eGFR's persistently <90 mL/min signify possible Chronic Kidney     Disease.  CBC     Status: Abnormal   Collection Time    01/02/14  5:41 PM      Result Value Range   WBC 13.6 (*) 4.0 - 10.5 K/uL   RBC 3.88 (*) 4.22 - 5.81 MIL/uL   Hemoglobin 12.2 (*) 13.0 - 17.0 g/dL   HCT 33.2 (*) 39.0 - 52.0 %   MCV 85.6  78.0 - 100.0 fL   MCH 31.4  26.0 - 34.0 pg   MCHC 36.7 (*) 30.0 - 36.0 g/dL   RDW 12.4  11.5 - 15.5 %   Platelets 191  150 - 400 K/uL  PROTIME-INR     Status: Abnormal   Collection Time    01/02/14  5:41 PM      Result Value Range   Prothrombin Time 16.1 (*) 11.6 - 15.2 seconds   INR 1.32  0.00 - 1.49  APTT     Status: None   Collection Time    01/02/14  5:41 PM      Result Value Range   aPTT 28  24 - 37 seconds  CG4 I-STAT (LACTIC ACID)     Status: Abnormal   Collection Time    01/02/14  5:55 PM      Result Value Range   Lactic Acid, Venous 10.98 (*) 0.5 - 2.2 mmol/L  POCT I-STAT 3, BLOOD GAS (G3+)     Status: Abnormal   Collection Time    01/02/14  6:09 PM       Result Value Range   pH, Arterial 7.179 (*) 7.350 - 7.450   pCO2 arterial 34.3 (*) 35.0 - 45.0 mmHg   pO2, Arterial 476.0 (*) 80.0 - 100.0 mmHg   Bicarbonate 12.8 (*) 20.0 - 24.0 mEq/L   TCO2 14  0 - 100 mmol/L   O2 Saturation 100.0     Acid-base deficit 15.0 (*) 0.0 - 2.0 mmol/L   Collection site RADIAL, ALLEN'S TEST ACCEPTABLE     Drawn by RT     Sample type ARTERIAL     Comment NOTIFIED PHYSICIAN    URINALYSIS, ROUTINE W REFLEX MICROSCOPIC     Status: Abnormal   Collection Time    01/02/14  6:27 PM      Result Value Range   Color, Urine YELLOW  YELLOW   APPearance CLEAR  CLEAR   Specific Gravity, Urine 1.012  1.005 - 1.030   pH 5.0  5.0 - 8.0   Glucose, UA NEGATIVE  NEGATIVE mg/dL   Hgb urine dipstick MODERATE (*) NEGATIVE   Bilirubin Urine NEGATIVE  NEGATIVE   Ketones, ur NEGATIVE  NEGATIVE mg/dL   Protein, ur NEGATIVE  NEGATIVE mg/dL   Urobilinogen, UA 0.2  0.0 - 1.0 mg/dL   Nitrite NEGATIVE  NEGATIVE   Leukocytes, UA NEGATIVE  NEGATIVE  URINE MICROSCOPIC-ADD ON     Status: None   Collection Time    01/02/14  6:27 PM      Result Value Range  RBC / HPF 3-6  <3 RBC/hpf   Urine-Other AMORPHOUS URATES/PHOSPHATES    MRSA PCR SCREENING     Status: None   Collection Time    01/02/14  8:33 PM      Result Value Range   MRSA by PCR NEGATIVE  NEGATIVE   Comment:            The GeneXpert MRSA Assay (FDA     approved for NASAL specimens     only), is one component of a     comprehensive MRSA colonization     surveillance program. It is not     intended to diagnose MRSA     infection nor to guide or     monitor treatment for     MRSA infections.  GLUCOSE, CAPILLARY     Status: Abnormal   Collection Time    01/02/14  8:38 PM      Result Value Range   Glucose-Capillary 147 (*) 70 - 99 mg/dL   Comment 1 Notify RN    CREATININE, URINE, RANDOM     Status: None   Collection Time    01/02/14  8:59 PM      Result Value Range   Creatinine, Urine 65.66    SODIUM, URINE,  RANDOM     Status: None   Collection Time    01/02/14  8:59 PM      Result Value Range   Sodium, Ur 66    ACETAMINOPHEN LEVEL     Status: None   Collection Time    01/02/14 10:05 PM      Result Value Range   Acetaminophen (Tylenol), Serum <15.0  10 - 30 ug/mL   Comment:            THERAPEUTIC CONCENTRATIONS VARY     SIGNIFICANTLY. A RANGE OF 10-30     ug/mL MAY BE AN EFFECTIVE     CONCENTRATION FOR MANY PATIENTS.     HOWEVER, SOME ARE BEST TREATED     AT CONCENTRATIONS OUTSIDE THIS     RANGE.     ACETAMINOPHEN CONCENTRATIONS     >150 ug/mL AT 4 HOURS AFTER     INGESTION AND >50 ug/mL AT 12     HOURS AFTER INGESTION ARE     OFTEN ASSOCIATED WITH TOXIC     REACTIONS.  SALICYLATE LEVEL     Status: Abnormal   Collection Time    01/02/14 10:05 PM      Result Value Range   Salicylate Lvl <9.7 (*) 2.8 - 20.0 mg/dL  CK     Status: Abnormal   Collection Time    01/02/14 10:05 PM      Result Value Range   Total CK 462 (*) 7 - 232 U/L  TROPONIN I     Status: None   Collection Time    01/02/14 10:05 PM      Result Value Range   Troponin I <0.30  <0.30 ng/mL   Comment:            Due to the release kinetics of cTnI,     a negative result within the first hours     of the onset of symptoms does not rule out     myocardial infarction with certainty.     If myocardial infarction is still suspected,     repeat the test at appropriate intervals.  PHOSPHORUS     Status: Abnormal   Collection Time    01/02/14 10:05  PM      Result Value Range   Phosphorus 10.2 (*) 2.3 - 4.6 mg/dL  URIC ACID     Status: Abnormal   Collection Time    01/02/14 10:05 PM      Result Value Range   Uric Acid, Serum 16.5 (*) 4.0 - 7.8 mg/dL  HIV ANTIBODY (ROUTINE TESTING)     Status: None   Collection Time    01/02/14 10:05 PM      Result Value Range   HIV NON REACTIVE  NON REACTIVE   Comment: Performed at [facility], ACUTE     Status: None   Collection Time    01/02/14  10:05 PM      Result Value Range   Hepatitis B Surface Ag NEGATIVE  NEGATIVE   HCV Ab NEGATIVE  NEGATIVE   Hep A IgM NON REACTIVE  NON REACTIVE   Hep B C IgM NON REACTIVE  NON REACTIVE   Comment: (NOTE)     High levels of Hepatitis B Core IgM antibody are detectable     during the acute stage of Hepatitis B. This antibody is used     to differentiate current from past HBV infection.     Performed at [place], CAPILLARY     Status: Abnormal   Collection Time    01/02/14 11:51 PM      Result Value Range   Glucose-Capillary 110 (*) 70 - 99 mg/dL   Comment 1 Notify RN    TROPONIN I     Status: None   Collection Time    01/03/14  1:25 AM      Result Value Range   Troponin I <0.30  <0.30 ng/mL   Comment:            Due to the release kinetics of cTnI,     a negative result within the first hours     of the onset of symptoms does not rule out     myocardial infarction with certainty.     If myocardial infarction is still suspected,     repeat the test at appropriate intervals.  BLOOD GAS, ARTERIAL     Status: Abnormal   Collection Time    01/03/14  3:40 AM      Result Value Range   FIO2 0.40     Delivery systems VENTILATOR     Mode PRESSURE REGULATED VOLUME CONTROL     VT 650     Rate 16     Peep/cpap 5.0     pH, Arterial 7.443  7.350 - 7.450   pCO2 arterial 25.7 (*) 35.0 - 45.0 mmHg   pO2, Arterial 204.0 (*) 80.0 - 100.0 mmHg   Bicarbonate 17.5 (*) 20.0 - 24.0 mEq/L   TCO2 18.3  0 - 100 mmol/L   Acid-base deficit 6.0 (*) 0.0 - 2.0 mmol/L   O2 Saturation 99.2     Patient temperature 97.2     Collection site RIGHT RADIAL     Drawn by [id number]     Sample type ARTERIAL DRAW     Allens test (pass/fail) PASS  PASS  GLUCOSE, CAPILLARY     Status: None   Collection Time    01/03/14  3:55 AM      Result Value Range   Glucose-Capillary 76  70 - 99 mg/dL  RENAL FUNCTION PANEL     Status: Abnormal   Collection Time    01/03/14  4:23 AM      Result Value  Range   Sodium 140  137 - 147 mEq/L   Potassium 4.2  3.7 - 5.3 mEq/L   Comment: DELTA CHECK NOTED   Chloride 101  96 - 112 mEq/L   Comment: DELTA CHECK NOTED   CO2 17 (*) 19 - 32 mEq/L   Glucose, Bld 88  70 - 99 mg/dL   BUN 89 (*) 6 - 23 mg/dL   Creatinine, Ser 13.26 (*) 0.50 - 1.35 mg/dL   Comment: DELTA CHECK NOTED   Calcium 7.7 (*) 8.4 - 10.5 mg/dL   Phosphorus 6.3 (*) 2.3 - 4.6 mg/dL   Albumin 3.0 (*) 3.5 - 5.2 g/dL   GFR calc non Af Amer 4 (*) >90 mL/min   GFR calc Af Amer 4 (*) >90 mL/min   Comment: (NOTE)     The eGFR has been calculated using the CKD EPI equation.     This calculation has not been validated in all clinical situations.     eGFR's persistently <90 mL/min signify possible Chronic Kidney     Disease.  CBC     Status: Abnormal   Collection Time    01/03/14  4:23 AM      Result Value Range   WBC 6.6  4.0 - 10.5 K/uL   RBC 3.34 (*) 4.22 - 5.81 MIL/uL   Hemoglobin 10.4 (*) 13.0 - 17.0 g/dL   HCT 27.8 (*) 39.0 - 52.0 %   MCV 83.2  78.0 - 100.0 fL   MCH 31.1  26.0 - 34.0 pg   MCHC 37.4 (*) 30.0 - 36.0 g/dL   Comment: RULED OUT INTERFERING SUBSTANCES   RDW 12.5  11.5 - 15.5 %   Platelets 135 (*) 150 - 400 K/uL   Comment: DELTA CHECK NOTED     REPEATED TO VERIFY  MAGNESIUM     Status: Abnormal   Collection Time    01/03/14  4:23 AM      Result Value Range   Magnesium 3.1 (*) 1.5 - 2.5 mg/dL  APTT     Status: None   Collection Time    01/03/14  4:23 AM      Result Value Range   aPTT 31  24 - 37 seconds  GLUCOSE, CAPILLARY     Status: None   Collection Time    01/03/14  7:47 AM      Result Value Range   Glucose-Capillary 85  70 - 99 mg/dL  TROPONIN I     Status: None   Collection Time    01/03/14  7:54 AM      Result Value Range   Troponin I <0.30  <0.30 ng/mL   Comment:            Due to the release kinetics of cTnI,     a negative result within the first hours     of the onset of symptoms does not rule out     myocardial infarction with  certainty.     If myocardial infarction is still suspected,     repeat the test at appropriate intervals.  OSMOLALITY     Status: Abnormal   Collection Time    01/03/14  8:56 AM      Result Value Range   Osmolality 303 (*) 275 - 300 mOsm/kg   Comment: Performed at [facility]     Assessment/Plan: 48 y/o with AMS following a generalized seizure 01/02/14. Intubated on the vent.  Rigid all over with some dyskinetic-like movements mainly upon stimuli, CK 462 but afebrile. Broad differential including provoked seizure in the context of medication toxicity or ingestion of toxins, early post traumatic seizure. NMS?. Consider trial of dantrolene. Will get EEG to exclude NCSE. Will follow up   Dorian Pod, MD 01/03/2014, 12:08 PM

## 2014-01-03 NOTE — Procedures (Signed)
Central Venous Catheter Insertion Procedure Note Brady Hoffman [id number] Apr 25, 1966  Procedure: Insertion of Central Venous Catheter Indications: Assessment of intravascular volume, Drug and/or fluid administration and Frequent blood sampling  Procedure Details Consent: Unable to obtain consent because of altered level of consciousness. Time Out: Verified patient identification, verified procedure, site/side was marked, verified correct patient position, special equipment/implants available, medications/allergies/relevent history reviewed, required imaging and test results available.  Performed  Maximum sterile technique was used including antiseptics, cap, gloves, gown, hand hygiene, mask and sheet. Skin prep: Chlorhexidine; local anesthetic administered A antimicrobial bonded/coated triple lumen catheter was placed in the left internal jugular vein using the Seldinger technique. Procedure done under direct visualization with ultrasound.  Evaluation Blood flow good Complications: No apparent complications Patient did tolerate procedure well. Chest X-ray ordered to verify placement.  CXR: normal.  Hoffman, Brady Palmatier 01/03/2014, 12:18 AM

## 2014-01-03 NOTE — Procedures (Signed)
Admit: 01/02/2014 LOS: 1  33M admit after found unconscious with profound renal failure (unclear chronicity), increased anion gap metabolic acidosis, seizure, uremia, hyperkalemia.    Current CRRT Prescription: Start Date: 01/02/14 Catheter: R femoral BFR: 250 Pre Blood Pump: 400 DFR: 2000 Replacement Rate: 200 Goal UF: no UF Anticoagulation: none Clotting: none  S: On CRRT for 12h No further seizures Salycylates negative, Lacate was 10, UA w/o ketones  O: 02/01 0701 - 02/02 0700 In: 1977.6 [I.V.:1432.6; NG/GT:70; IV Piggyback:475] Out: 730 [Urine:605; Emesis/NG output:100]  Filed Weights   01/02/14 1800 01/02/14 2019 01/03/14 0400  Weight: 96 kg (211 lb 10.3 oz) 88.2 kg (194 lb 7.1 oz) 93 kg (205 lb 0.4 oz)     Recent Labs Lab 01/02/14 1741 01/02/14 2205 01/03/14 0423  NA 135*  --  140  K 5.7*  --  4.2  CL 89*  --  101  CO2 5*  --  17*  GLUCOSE 217*  --  88  BUN 130*  --  89*  CREATININE 20.63*  --  13.26*  CALCIUM 8.7  --  7.7*  PHOS  --  10.2* 6.3*    Recent Labs Lab 01/02/14 1741 01/03/14 0423  WBC 13.6* 6.6  HGB 12.2* 10.4*  HCT 33.2* 27.8*  MCV 85.6 83.2  PLT 191 135*    ABG    Component Value Date/Time   PHART 7.443 01/03/2014 0340   PCO2ART 25.7* 01/03/2014 0340   PO2ART 204.0* 01/03/2014 0340   HCO3 17.5* 01/03/2014 0340   TCO2 18.3 01/03/2014 0340   ACIDBASEDEF 6.0* 01/03/2014 0340   O2SAT 99.2 01/03/2014 0340    Plan:  1. 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  Labs improvign on CRRT.  Cont current settings for another 24h and likely can come off to close observation afterwards.  UOP is favorable.  No UF with CRRT.  2. Inc AG Metabolic acidosis: try to add on measured serums osms, ethylene glycol and methaol to admission labs. 3. Hyperkalemia: resolved with CRRT 4. Uremia and Seizure: neurology consulted.   Certainly uremia contributed and or caused.    Pearson Grippe, MD [facility] pgr [phone]

## 2014-01-03 NOTE — Progress Notes (Signed)
EEG completed at bedside 

## 2014-01-03 NOTE — Progress Notes (Signed)
Patient ID: Brady Hoffman, male   DOB: 1966/06/30, 48 y.o.   MRN: [id number]    Subjective: Intubated, on CRRT  Objective: Vital signs in last 24 hours: Temp:  [95 F (35 C)-98.1 F (36.7 C)] 97.2 F (36.2 C) (02/02 0400) Pulse Rate:  [57-118] 59 (02/02 0700) Resp:  [11-43] 16 (02/02 0700) BP: (129-158)/(81-91) 158/90 mmHg (02/02 0700) SpO2:  [97 %-100 %] 100 % (02/02 0700) FiO2 (%):  [40 %-100 %] 40 % (02/02 0700) Weight:  [194 lb 7.1 oz (88.2 kg)-211 lb 10.3 oz (96 kg)] 205 lb 0.4 oz (93 kg) (02/02 0400) Last BM Date:  (unknown)  Intake/Output from previous day: 02/01 0701 - 02/02 0700 In: 1977.6 [I.V.:1432.6; NG/GT:70; IV Piggyback:475] Out: 730 [Urine:605; Emesis/NG output:100] Intake/Output this shift:    General appearance: no distress Resp: mild rhonchi B Cardio: regular rate and rhythm GI: soft, NT, ND Neuro: PERL, sedated, brisk withdraw to noxious  Lab Results: CBC   Recent Labs  01/02/14 1741 01/03/14 0423  WBC 13.6* 6.6  HGB 12.2* 10.4*  HCT 33.2* 27.8*  PLT 191 135*   BMET  Recent Labs  01/02/14 1741 01/03/14 0423  NA 135* 140  K 5.7* 4.2  CL 89* 101  CO2 5* 17*  GLUCOSE 217* 88  BUN 130* 89*  CREATININE 20.63* 13.26*  CALCIUM 8.7 7.7*   PT/INR  Recent Labs  01/02/14 1741  LABPROT 16.1*  INR 1.32   ABG  Recent Labs  01/02/14 1809 01/03/14 0340  PHART 7.179* 7.443  HCO3 12.8* 17.5*    Studies/Results: Ct Abdomen Pelvis Wo Contrast  01/02/2014   CLINICAL DATA:  Patient fell approximately 8 hr ago and hit head, developed seizure and mental status changes, intubated  EXAM: CT ABDOMEN AND PELVIS WITHOUT CONTRAST  TECHNIQUE: Multidetector CT imaging of the abdomen and pelvis was performed following the standard protocol without intravenous contrast.  COMPARISON:  None.  FINDINGS: There is left lower lobe consolidation. There is likely a small left effusion. There is beam artifact across lung base from positioning of the patient's  arms. An NG tube extends with its tip just barely into the stomach.  Liver, spleen, pancreas, kidneys, and adrenal glands are normal. There are gallstones. Aorta is not dilated. Bowel is normal. No ascites. There is a Foley balloon catheter in the bladder. Reproductive organs are normal. No acute musculoskeletal findings.  IMPRESSION: 1. Left lower lobe consolidation and small left effusion. The possibility of aspiration pneumonitis cannot be excluded. 2. NG tube tip just barely into stomach.   Electronically Signed   By: Skipper Cliche M.D.   On: 01/02/2014 20:35   Ct Head Wo Contrast  01/02/2014   CLINICAL DATA:  Seizure this afternoon, fell earlier today, history hyperlipidemia, schizoaffective disorder  EXAM: CT HEAD WITHOUT CONTRAST  CT CERVICAL SPINE WITHOUT CONTRAST  TECHNIQUE: Multidetector CT imaging of the head and cervical spine was performed following the standard protocol without intravenous contrast. Multiplanar CT image reconstructions of the cervical spine were also generated.  COMPARISON:  01/22/2007  FINDINGS: CT HEAD FINDINGS  Minimal atrophy.  Normal ventricular morphology.  No midline shift or mass effect.  Otherwise normal appearance of brain parenchyma.  No intracranial hemorrhage, mass lesion or evidence acute infarction.  No extra-axial fluid collections.  Bones and sinuses unremarkable.  CT CERVICAL SPINE FINDINGS  Skullbase intact.  Prevertebral soft tissues normal thickness.  Nasogastric tube coiled in hypopharynx.  Vertebral body and disc space heights maintained.  No acute fracture,  subluxation or bone destruction.  Soft tissues unremarkable.  IMPRESSION: No acute intracranial abnormalities.  No acute cervical spine abnormalities.   Electronically Signed   By: Lavonia Dana M.D.   On: 01/02/2014 18:07   Ct Cervical Spine Wo Contrast  01/02/2014   CLINICAL DATA:  Seizure this afternoon, fell earlier today, history hyperlipidemia, schizoaffective disorder  EXAM: CT HEAD WITHOUT  CONTRAST  CT CERVICAL SPINE WITHOUT CONTRAST  TECHNIQUE: Multidetector CT imaging of the head and cervical spine was performed following the standard protocol without intravenous contrast. Multiplanar CT image reconstructions of the cervical spine were also generated.  COMPARISON:  01/22/2007  FINDINGS: CT HEAD FINDINGS  Minimal atrophy.  Normal ventricular morphology.  No midline shift or mass effect.  Otherwise normal appearance of brain parenchyma.  No intracranial hemorrhage, mass lesion or evidence acute infarction.  No extra-axial fluid collections.  Bones and sinuses unremarkable.  CT CERVICAL SPINE FINDINGS  Skullbase intact.  Prevertebral soft tissues normal thickness.  Nasogastric tube coiled in hypopharynx.  Vertebral body and disc space heights maintained.  No acute fracture, subluxation or bone destruction.  Soft tissues unremarkable.  IMPRESSION: No acute intracranial abnormalities.  No acute cervical spine abnormalities.   Electronically Signed   By: Lavonia Dana M.D.   On: 01/02/2014 18:07   Dg Pelvis Portable  01/02/2014   CLINICAL DATA:  Fall and seizures  EXAM: PORTABLE PELVIS 1-2 VIEWS  COMPARISON:  None.  FINDINGS: There is no evidence of pelvic fracture or diastasis. No other pelvic bone lesions are seen.  IMPRESSION: Negative.   Electronically Signed   By: Kathreen Devoid   On: 01/02/2014 17:54   Dg Chest Port 1 View  01/03/2014   CLINICAL DATA:  Check endotracheal tube position.  EXAM: PORTABLE CHEST - 1 VIEW  COMPARISON:  01/02/2014  FINDINGS: Endotracheal tube tip lies 5.3 cm above the carina. Left internal jugular central venous line and nasogastric tube are stable. There is mild medial lung base atelectasis. The lungs are otherwise clear. Cardiac silhouette is normal in size. Normal mediastinal contour.  IMPRESSION: Support apparatus is stable and well positioned.  Mild medial lung base atelectasis, increased from previous day's study.   Electronically Signed   By: Lajean Manes M.D.    On: 01/03/2014 07:11   Dg Chest Port 1 View  01/02/2014   CLINICAL DATA:  Left IJ central venous catheter  EXAM: PORTABLE CHEST - 1 VIEW  COMPARISON:  Chest x-ray from the same day at 5:32 p.m.  FINDINGS: Improved positioning of orogastric tube, now reaching the stomach. ETT ends at the level of the mid thoracic trachea. New left IJ catheter, tip at the level of the upper SVC. No definitive pneumothorax. Streaky retrocardiac opacity appears stable from prior, most likely atelectasis based on intervening abdominal CT. Normal heart size.  IMPRESSION: 1. New left IJ catheter.  No adverse feature. 2. Improved positioning of orogastric tube. 3. Retrocardiac atelectasis.   Electronically Signed   By: Jorje Guild M.D.   On: 01/02/2014 22:35   Dg Chest Port 1 View  01/02/2014   CLINICAL DATA:  Fall, seizures  EXAM: PORTABLE CHEST - 1 VIEW  COMPARISON:  None.  FINDINGS: There is an endotracheal tube with the tip 3.6 cm above the carina. There are low lung volumes. There is no focal consolidation, pleural effusion or pneumothorax. Normal cardiomediastinal silhouette. The osseous structures are unremarkable.  There is a nasogastric tube with the tip in the upper mid esophagus.  IMPRESSION:  1. Endotracheal tube with the tip 3.6 cm above the carina. 2. There is a nasogastric tube with the tip in the upper mid esophagus.   Electronically Signed   By: Kathreen Devoid   On: 01/02/2014 17:54    Anti-infectives: Anti-infectives   Start     Dose/Rate Route Frequency Ordered Stop   01/03/14 0000  piperacillin-tazobactam (ZOSYN) IVPB 3.375 g     3.375 g 100 mL/hr over 30 Minutes Intravenous 4 times per day 01/02/14 2308        Assessment/Plan: Seizure activity and AKI VDRF due to above Excellent management per CCM No defined injuries and no ongoing active trauma issues. We will sign off.   LOS: 1 day    Georganna Skeans, MD, MPH, FACS Pager: [phone]  01/03/2014

## 2014-01-03 NOTE — ED Provider Notes (Signed)
I have personally seen and examined the patient.  I have discussed the plan of care with the resident.  I have reviewed the documentation on PMH/FH/Soc. History.  I have reviewed the documentation of the resident and agree.  Orlie Dakin, MD 01/03/14 [phone]

## 2014-01-03 NOTE — Progress Notes (Signed)
UR Completed.  Vergie Living [id number] 01/03/2014

## 2014-01-03 NOTE — Procedures (Signed)
Central Venous Catheter Insertion Procedure Note Brady Hoffman [id number] [birth year]  Procedure: Insertion of Dialysis Catheter Indications: Dialysis  Procedure Details Consent: Unable to obtain consent because of altered level of consciousness. Time Out: Verified patient identification, verified procedure, site/side was marked, verified correct patient position, special equipment/implants available, medications/allergies/relevent history reviewed, required imaging and test results available.  Performed  Maximum sterile technique was used including antiseptics, cap, gloves, gown, hand hygiene, mask and sheet. Skin prep: Chlorhexidine; local anesthetic administered A antimicrobial bonded/coated triple lumen catheter was placed in the right femoral vein using the Seldinger technique. Procedure done under direct visualization with ultrasound.   Evaluation Blood flow good Complications: No apparent complications Patient did tolerate procedure well. Chest X-ray ordered to verify placement.    Waynetta Pean 01/03/2014, 12:20 AM

## 2014-01-04 ENCOUNTER — Inpatient Hospital Stay (HOSPITAL_COMMUNITY): Payer: Medicare Other

## 2014-01-04 LAB — URINE CULTURE
COLONY COUNT: NO GROWTH
COLONY COUNT: NO GROWTH
CULTURE: NO GROWTH
Culture: NO GROWTH

## 2014-01-04 LAB — URINE DRUGS OF ABUSE SCREEN W ALC, ROUTINE (REF LAB)
AMPHETAMINE SCRN UR: NEGATIVE
Barbiturate Quant, Ur: NEGATIVE
Benzodiazepines.: NEGATIVE
COCAINE METABOLITES: NEGATIVE
CREATININE, U: 75.6 mg/dL
Marijuana Metabolite: NEGATIVE
Methadone: NEGATIVE
OPIATE SCREEN, URINE: NEGATIVE
Phencyclidine (PCP): NEGATIVE
Propoxyphene: NEGATIVE

## 2014-01-04 LAB — BLOOD GAS, ARTERIAL
Acid-base deficit: 1.6 mmol/L (ref 0.0–2.0)
Bicarbonate: 21.5 mEq/L (ref 20.0–24.0)
Drawn by: 31101
FIO2: 0.3 %
LHR: 12 {breaths}/min
MECHVT: 650 mL
O2 SAT: 97.6 %
PCO2 ART: 28.2 mmHg — AB (ref 35.0–45.0)
PEEP/CPAP: 5 cmH2O
Patient temperature: 96.7
TCO2: 22.4 mmol/L (ref 0–100)
pH, Arterial: 7.49 — ABNORMAL HIGH (ref 7.350–7.450)
pO2, Arterial: 143 mmHg — ABNORMAL HIGH (ref 80.0–100.0)

## 2014-01-04 LAB — CBC
HEMATOCRIT: 27.2 % — AB (ref 39.0–52.0)
Hemoglobin: 10.1 g/dL — ABNORMAL LOW (ref 13.0–17.0)
MCH: 31.1 pg (ref 26.0–34.0)
MCHC: 37.1 g/dL — AB (ref 30.0–36.0)
MCV: 83.7 fL (ref 78.0–100.0)
Platelets: 123 10*3/uL — ABNORMAL LOW (ref 150–400)
RBC: 3.25 MIL/uL — ABNORMAL LOW (ref 4.22–5.81)
RDW: 12.8 % (ref 11.5–15.5)
WBC: 3.7 10*3/uL — ABNORMAL LOW (ref 4.0–10.5)

## 2014-01-04 LAB — RENAL FUNCTION PANEL
ALBUMIN: 2.6 g/dL — AB (ref 3.5–5.2)
BUN: 32 mg/dL — AB (ref 6–23)
CO2: 22 mEq/L (ref 19–32)
CREATININE: 5.23 mg/dL — AB (ref 0.50–1.35)
Calcium: 7.4 mg/dL — ABNORMAL LOW (ref 8.4–10.5)
Chloride: 102 mEq/L (ref 96–112)
GFR calc Af Amer: 14 mL/min — ABNORMAL LOW (ref >90)
GFR calc non Af Amer: 12 mL/min — ABNORMAL LOW (ref >90)
Glucose, Bld: 85 mg/dL (ref 70–99)
PHOSPHORUS: 3 mg/dL (ref 2.3–4.6)
Potassium: 3.6 mEq/L — ABNORMAL LOW (ref 3.7–5.3)
Sodium: 140 mEq/L (ref 137–147)

## 2014-01-04 LAB — MAGNESIUM
MAGNESIUM: 2.6 mg/dL — AB (ref 1.5–2.5)
Magnesium: 2.5 mg/dL (ref 1.5–2.5)
Magnesium: 2.4 mg/dL (ref 1.5–2.5)
Magnesium: 2.4 mg/dL (ref 1.5–2.5)
Magnesium: 2.4 mg/dL (ref 1.5–2.5)

## 2014-01-04 LAB — CK: Total CK: 167 U/L (ref 7–232)

## 2014-01-04 LAB — GLUCOSE, CAPILLARY
GLUCOSE-CAPILLARY: 82 mg/dL (ref 70–99)
Glucose-Capillary: 82 mg/dL (ref 70–99)
Glucose-Capillary: 76 mg/dL (ref 70–99)
Glucose-Capillary: 84 mg/dL (ref 70–99)
Glucose-Capillary: 84 mg/dL (ref 70–99)
Glucose-Capillary: 80 mg/dL (ref 70–99)

## 2014-01-04 LAB — C3 COMPLEMENT: C3 Complement: 119 mg/dL (ref 90–180)

## 2014-01-04 LAB — PROCALCITONIN: Procalcitonin: 0.31 ng/mL

## 2014-01-04 LAB — PHOSPHORUS
PHOSPHORUS: 3.9 mg/dL (ref 2.3–4.6)
PHOSPHORUS: 5.7 mg/dL — AB (ref 2.3–4.6)
Phosphorus: 2.8 mg/dL (ref 2.3–4.6)
Phosphorus: 3.9 mg/dL (ref 2.3–4.6)

## 2014-01-04 LAB — ANA: Anti Nuclear Antibody(ANA): NEGATIVE

## 2014-01-04 LAB — APTT: aPTT: 38 seconds — ABNORMAL HIGH (ref 24–37)

## 2014-01-04 LAB — C4 COMPLEMENT: COMPLEMENT C4, BODY FLUID: 37 mg/dL (ref 10–40)

## 2014-01-04 LAB — ETHYLENE GLYCOL: Ethylene Glycol Lvl: <5 mg/dL (ref <5)

## 2014-01-04 MED ORDER — VITAL HIGH PROTEIN PO LIQD
1000.0000 mL | ORAL | Status: DC
  Administered 2014-01-04: 1000 mL
  Filled 2014-01-04 (×2): qty 1000

## 2014-01-04 MED ORDER — VITAL AF 1.2 CAL PO LIQD
1000.0000 mL | ORAL | Status: DC
  Administered 2014-01-04 – 2014-01-09 (×5): 1000 mL
  Filled 2014-01-04 (×13): qty 1000

## 2014-01-04 MED ORDER — PRO-STAT SUGAR FREE PO LIQD
30.0000 mL | Freq: Two times a day (BID) | ORAL | Status: DC
  Administered 2014-01-04: 30 mL
  Filled 2014-01-04 (×2): qty 30

## 2014-01-04 MED ORDER — VITAL HIGH PROTEIN PO LIQD
1000.0000 mL | ORAL | Status: DC
  Filled 2014-01-04 (×2): qty 1000

## 2014-01-04 NOTE — Progress Notes (Signed)
NUTRITION FOLLOW UP  Intervention:    Utilize 88M PEPuP Protocol: initiate TF via OGT with Vital AF 1.2 at 25 ml/h on day 1; on day 2, increase to goal rate of 70 ml/h (1680 ml per day) to provide 2016 kcals, 126 gm protein, 1362 ml free water daily.  Nutrition Dx:   Inadequate oral intake related to inability to eat as evidenced by NPO status. Ongoing.  Goal:   Intake to meet >90% of estimated nutrition needs. Unmet.  Monitor:   TF tolerance/adequacy, weight trend, labs, vent status.  Assessment:   Patient with Schizoaffective disorder, resident of a group home. Was in his usual state of health and sustained a fall with head trauma. He then had a generalized seizure. At arrival to the ED had acute renal failure, intubated for airway protection.   Per discussion with RN, TF was not started yesterday. Received MD Consult for TF initiation and management today.  Patient is currently intubated on ventilator support.  MV: 8 L/min Temp (24hrs), Avg:94.7 F (34.8 C), Min:91 F (32.8 C), Max:98 F (36.7 C)   Height: Ht Readings from Last 1 Encounters:  01/02/14 6\' 2"  (1.88 m)    Weight Status:   Wt Readings from Last 1 Encounters:  01/04/14 204 lb 12.9 oz (92.9 kg)  01/03/14  205 lb 0.4 oz (93 kg)  Re-estimated needs:  Kcal: 2050  Protein: 110-130 gm  Fluid: 2.1-2.2 L  Skin: no wounds  Diet Order: NPO   Intake/Output Summary (Last 24 hours) at 01/04/14 1301 Last data filed at 01/04/14 1200  Gross per 24 hour  Intake 4054.04 ml  Output   1615 ml  Net 2439.04 ml    Last BM: PTA   Labs:   Recent Labs Lab 01/03/14 0423  01/03/14 1600  01/04/14 0340 01/04/14 0342 01/04/14 1112 01/04/14 1117  NA 140  --  143  --  140  --   --   --   K 4.2  --  3.9  --  3.6*  --   --   --   CL 101  --  104  --  102  --   --   --   CO2 17*  --  21  --  22  --   --   --   BUN 89*  --  54*  --  32*  --   --   --   CREATININE 13.26*  --  8.13*  --  5.23*  --   --   --    CALCIUM 7.7*  --  7.7*  --  7.4*  --   --   --   MG 3.1*  < >  --   < >  --  2.5 2.4 2.4  PHOS 6.3*  < > 4.3  < > 3.0  --  3.9 3.9  GLUCOSE 88  --  82  --  85  --   --   --   < > = values in this interval not displayed.  CBG (last 3)   Recent Labs  01/04/14 0412 01/04/14 0742 01/04/14 1133  GLUCAP 82 76 84    Scheduled Meds: . antiseptic oral rinse  15 mL Mouth Rinse QID  . chlorhexidine  15 mL Mouth Rinse BID  . feeding supplement (PRO-STAT SUGAR FREE 64)  30 mL Per Tube BID  . feeding supplement (VITAL HIGH PROTEIN)  1,000 mL Per Tube Q24H  . folic acid  1 mg  Intravenous Daily  . heparin  5,000 Units Subcutaneous Q8H  . hydrALAZINE  10 mg Intravenous Q4H  . insulin aspart  1-3 Units Subcutaneous Q4H  . levETIRAcetam  500 mg Intravenous Q12H  . pantoprazole (PROTONIX) IV  40 mg Intravenous QHS  . sodium chloride  125 mL Intravenous Once    Continuous Infusions: . sodium chloride 100 mL/hr at 01/03/14 1854  . sodium chloride    . sodium chloride 125 mL/hr (01/02/14 1735)  . midazolam (VERSED) infusion Stopped (01/04/14 1234)  . dialysate (PRISMASATE) 2,000 mL/hr at 01/04/14 0856  . dialysis replacement fluid (prismasate) 400 mL/hr at 01/04/14 0019  . dialysis replacement fluid (prismasate) 200 mL/hr at 01/04/14 0014  . propofol 35 mcg/kg/min (01/04/14 0800)     Molli Barrows, RD, LDN, [place] Pager [phone] After Hours Pager [phone]

## 2014-01-04 NOTE — Progress Notes (Signed)
NEURO HOSPITALIST PROGRESS NOTE   SUBJECTIVE:                                                                                                                        Sedated with propofol, intubated on the vent. Remains afebrile No further seizures noted. EEG unremarkable, mainly sleep pattern. On keppra 500 mg IV BID. Methanol level and ethylene glycol negative. Renal status improved, Cr 5.23 today. Normal white count.   OBJECTIVE:                                                                                                                           Vital signs in last 24 hours: Temp:  [91 F (32.8 C)-99.1 F (37.3 C)] 98.7 F (37.1 C) (02/03 1700) Pulse Rate:  [44-109] 93 (02/03 1700) Resp:  [11-16] 11 (02/03 1600) BP: (90-167)/(59-96) 115/72 mmHg (02/03 1700) SpO2:  [97 %-100 %] 100 % (02/03 1700) FiO2 (%):  [30 %-40 %] 30 % (02/03 1600) Weight:  [92.9 kg (204 lb 12.9 oz)] 92.9 kg (204 lb 12.9 oz) (02/03 0251)  Intake/Output from previous day: 02/02 0701 - 02/03 0700 In: 4308.3 [I.V.:3598.5; IV Piggyback:709.8] Out: 1600 [Urine:905; Emesis/NG output:200] Intake/Output this shift: Total I/O In: 1224 [I.V.:1080; NG/GT:44; IV Piggyback:100] Out: 627 [Urine:535; Other:92] Nutritional status: NPO  Past Medical History  Diagnosis Date  . Hyperlipidemia   . Schizoaffective disorder   . H/O: GI bleed     Neurologic Exam:  Mental Status:  Intubated on the vent, on propofol  Cranial Nerves:  Pupils 3-4 mm bilaterally, reactive. No gaze preference. EOM full without nystagmus. Face appears to be symmetric. Tongue: intubated.  Motor:  He is moving all limbs symmetrically Sensory: reacts to pain Deep Tendon Reflexes:  2 all over.  Plantars:  Right: downgoing Left: downgoing  Cerebellar:  Unable to test.  Gait:  No tested   Lab Results: Lab Results  Component Value Date/Time   CHOL 139 10/20/2009  8:54 PM   Lipid Panel No  results found for this basename: CHOL, TRIG, HDL, CHOLHDL, VLDL, LDLCALC,  in the last 72 hours  Studies/Results: Ct Abdomen Pelvis Wo Contrast  01/02/2014   CLINICAL DATA:  Patient fell approximately 8 hr ago and  hit head, developed seizure and mental status changes, intubated  EXAM: CT ABDOMEN AND PELVIS WITHOUT CONTRAST  TECHNIQUE: Multidetector CT imaging of the abdomen and pelvis was performed following the standard protocol without intravenous contrast.  COMPARISON:  None.  FINDINGS: There is left lower lobe consolidation. There is likely a small left effusion. There is beam artifact across lung base from positioning of the patient's arms. An NG tube extends with its tip just barely into the stomach.  Liver, spleen, pancreas, kidneys, and adrenal glands are normal. There are gallstones. Aorta is not dilated. Bowel is normal. No ascites. There is a Foley balloon catheter in the bladder. Reproductive organs are normal. No acute musculoskeletal findings.  IMPRESSION: 1. Left lower lobe consolidation and small left effusion. The possibility of aspiration pneumonitis cannot be excluded. 2. NG tube tip just barely into stomach.   Electronically Signed   By: Skipper Cliche M.D.   On: 01/02/2014 20:35   Ct Head Wo Contrast  01/02/2014   CLINICAL DATA:  Seizure this afternoon, fell earlier today, history hyperlipidemia, schizoaffective disorder  EXAM: CT HEAD WITHOUT CONTRAST  CT CERVICAL SPINE WITHOUT CONTRAST  TECHNIQUE: Multidetector CT imaging of the head and cervical spine was performed following the standard protocol without intravenous contrast. Multiplanar CT image reconstructions of the cervical spine were also generated.  COMPARISON:  01/22/2007  FINDINGS: CT HEAD FINDINGS  Minimal atrophy.  Normal ventricular morphology.  No midline shift or mass effect.  Otherwise normal appearance of brain parenchyma.  No intracranial hemorrhage, mass lesion or evidence acute infarction.  No extra-axial fluid  collections.  Bones and sinuses unremarkable.  CT CERVICAL SPINE FINDINGS  Skullbase intact.  Prevertebral soft tissues normal thickness.  Nasogastric tube coiled in hypopharynx.  Vertebral body and disc space heights maintained.  No acute fracture, subluxation or bone destruction.  Soft tissues unremarkable.  IMPRESSION: No acute intracranial abnormalities.  No acute cervical spine abnormalities.   Electronically Signed   By: Lavonia Dana M.D.   On: 01/02/2014 18:07   Ct Cervical Spine Wo Contrast  01/02/2014   CLINICAL DATA:  Seizure this afternoon, fell earlier today, history hyperlipidemia, schizoaffective disorder  EXAM: CT HEAD WITHOUT CONTRAST  CT CERVICAL SPINE WITHOUT CONTRAST  TECHNIQUE: Multidetector CT imaging of the head and cervical spine was performed following the standard protocol without intravenous contrast. Multiplanar CT image reconstructions of the cervical spine were also generated.  COMPARISON:  01/22/2007  FINDINGS: CT HEAD FINDINGS  Minimal atrophy.  Normal ventricular morphology.  No midline shift or mass effect.  Otherwise normal appearance of brain parenchyma.  No intracranial hemorrhage, mass lesion or evidence acute infarction.  No extra-axial fluid collections.  Bones and sinuses unremarkable.  CT CERVICAL SPINE FINDINGS  Skullbase intact.  Prevertebral soft tissues normal thickness.  Nasogastric tube coiled in hypopharynx.  Vertebral body and disc space heights maintained.  No acute fracture, subluxation or bone destruction.  Soft tissues unremarkable.  IMPRESSION: No acute intracranial abnormalities.  No acute cervical spine abnormalities.   Electronically Signed   By: Lavonia Dana M.D.   On: 01/02/2014 18:07   US Renal  01/03/2014   CLINICAL DATA:  Small amount of perinephric fluid identified.  EXAM: RENAL/URINARY TRACT ULTRASOUND COMPLETE  COMPARISON:  None.  FINDINGS: Right Kidney:  Length: 11 cm. Mild increased cortical echogenicity. No mass or hydronephrosis visualized.  Left  Kidney:  Length: 10.2 cm. Echogenicity within normal limits. No mass or hydronephrosis visualized.  Bladder:  Appears normal for degree  of bladder distention.  IMPRESSION: 1. No hydronephrosis. 2. Mild increased cortical echogenicity of the right kidney.   Electronically Signed   By: Kerby Moors M.D.   On: 01/03/2014 14:41   Dg Pelvis Portable  01/02/2014   CLINICAL DATA:  Fall and seizures  EXAM: PORTABLE PELVIS 1-2 VIEWS  COMPARISON:  None.  FINDINGS: There is no evidence of pelvic fracture or diastasis. No other pelvic bone lesions are seen.  IMPRESSION: Negative.   Electronically Signed   By: Kathreen Devoid   On: 01/02/2014 17:54   Dg Chest Port 1 View  01/04/2014   CLINICAL DATA:  Evaluate endotracheal tube. Hyperlipidemia. GI bleed. Acute renal failure.  EXAM: PORTABLE CHEST - 1 VIEW  COMPARISON:  DG CHEST 1V PORT dated 01/03/2014  FINDINGS: Patient rotated right. Endotracheal tube 4.8 cm above carina. Left internal jugular line with tip at high SVC.  Normal heart size. No pleural effusion or pneumothorax. Right lung clear. Given differences in technique and positioning, similar left base airspace disease.  IMPRESSION: Appropriate position of endotracheal tube.  Similar left base airspace disease. Favor atelectasis. Infection or aspiration difficult to exclude.   Electronically Signed   By: Abigail Miyamoto M.D.   On: 01/04/2014 07:39   Dg Chest Port 1 View  01/03/2014   CLINICAL DATA:  Check endotracheal tube position.  EXAM: PORTABLE CHEST - 1 VIEW  COMPARISON:  01/02/2014  FINDINGS: Endotracheal tube tip lies 5.3 cm above the carina. Left internal jugular central venous line and nasogastric tube are stable. There is mild medial lung base atelectasis. The lungs are otherwise clear. Cardiac silhouette is normal in size. Normal mediastinal contour.  IMPRESSION: Support apparatus is stable and well positioned.  Mild medial lung base atelectasis, increased from previous day's study.   Electronically Signed    By: Lajean Manes M.D.   On: 01/03/2014 07:11   Dg Chest Port 1 View  01/02/2014   CLINICAL DATA:  Left IJ central venous catheter  EXAM: PORTABLE CHEST - 1 VIEW  COMPARISON:  Chest x-ray from the same day at 5:32 p.m.  FINDINGS: Improved positioning of orogastric tube, now reaching the stomach. ETT ends at the level of the mid thoracic trachea. New left IJ catheter, tip at the level of the upper SVC. No definitive pneumothorax. Streaky retrocardiac opacity appears stable from prior, most likely atelectasis based on intervening abdominal CT. Normal heart size.  IMPRESSION: 1. New left IJ catheter.  No adverse feature. 2. Improved positioning of orogastric tube. 3. Retrocardiac atelectasis.   Electronically Signed   By: Jorje Guild M.D.   On: 01/02/2014 22:35   Dg Chest Port 1 View  01/02/2014   CLINICAL DATA:  Fall, seizures  EXAM: PORTABLE CHEST - 1 VIEW  COMPARISON:  None.  FINDINGS: There is an endotracheal tube with the tip 3.6 cm above the carina. There are low lung volumes. There is no focal consolidation, pleural effusion or pneumothorax. Normal cardiomediastinal silhouette. The osseous structures are unremarkable.  There is a nasogastric tube with the tip in the upper mid esophagus.  IMPRESSION: 1. Endotracheal tube with the tip 3.6 cm above the carina. 2. There is a nasogastric tube with the tip in the upper mid esophagus.   Electronically Signed   By: Kathreen Devoid   On: 01/02/2014 17:54    MEDICATIONS  I have reviewed the patient's current medications.  ASSESSMENT/PLAN:                                                                                                           48 y/o with AMS following a fall and development of a generalized seizure 01/02/14, profound renal failure ( significantly improving) increased anion gap metabolic acidosis. Methanol level and ethylene glycol  negative. Although still unresponsive and sedated with propofol on the vent, he is non focal. EEG without electrographic seizures. NMS unlikely ( afebrile and no rigid on exam today).  Will need MRI brain when more stable.  Will follow up. Dorian Pod, MD [facility] [phone]  01/04/2014, 5:31 PM

## 2014-01-04 NOTE — Procedures (Signed)
Admit: 01/02/2014 LOS: 2  59M admit after found unconscious with profound renal failure (unclear chronicity), increased anion gap metabolic acidosis, seizure, uremia, hyperkalemia.    Current CRRT Prescription: Start Date: 01/02/14 Catheter: R femoral BFR: 250 Pre Blood Pump: 400 DFR: 2000 Replacement Rate: 200 Anticoagulation: none Clotting: <q24h  S: Started on fomepizole for concern of toxic alcohol ingestion EEG w/ normal recording Methanol level negative, ethylene glycol pending Serum Osms marginally increased (after starting CRRT; in setting of severe renal faiulre and azotemia) C3 and C4 WNL; HBV, HCV, HIV negative  O: 02/02 0701 - 02/03 0700 In: 4165.8 [I.V.:3456; IV Piggyback:709.8] Out: 1600 [Urine:905; Emesis/NG output:200]  Filed Weights   01/02/14 2019 01/03/14 0400 01/04/14 0251  Weight: 88.2 kg (194 lb 7.1 oz) 93 kg (205 lb 0.4 oz) 92.9 kg (204 lb 12.9 oz)     Recent Labs Lab 01/03/14 0423  01/03/14 1600 01/04/14 0050 01/04/14 0340  NA 140  --  143  --  140  K 4.2  --  3.9  --  3.6*  CL 101  --  104  --  102  CO2 17*  --  21  --  22  GLUCOSE 88  --  82  --  85  BUN 89*  --  54*  --  32*  CREATININE 13.26*  --  8.13*  --  5.23*  CALCIUM 7.7*  --  7.7*  --  7.4*  PHOS 6.3*  < > 4.3 2.8 3.0  < > = values in this interval not displayed.  Recent Labs Lab 01/03/14 0423 01/03/14 1600 01/03/14 1800 01/04/14 0342  WBC 6.6  --  5.0 3.7*  HGB 10.4*  --  10.3* 10.1*  HCT 27.8*  --  27.7* 27.2*  MCV 83.2  --  83.7 83.7  PLT 135* 121* 114* 123*    ABG    Component Value Date/Time   PHART 7.490* 01/04/2014 0243   PCO2ART 28.2* 01/04/2014 0243   PO2ART 143.0* 01/04/2014 0243   HCO3 21.5 01/04/2014 0243   TCO2 22.4 01/04/2014 0243   ACIDBASEDEF 1.6 01/04/2014 0243   O2SAT 97.6 01/04/2014 0243    Plan:  1. 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  Serologies, complements, CK  negative.  [place] w/o obstruction.  Cause remains unclear.  Nonoliguric.  See #2 2. Inc AG Metabolic acidosis: Certainly could be from seizure and renal failure.  Toxic alcohol ingestion is possible given uncertainty in presentation.  Methanol level negative and renal failure to this severity not expected with ingestion. Ethylene glycol pending and could cause some renal failure.  Given that there was large AG, and at least when measured osmolar gap was small, suggests that even if this was alcohol ingestion the majority of the alcohol had been metabolized already.  Will cont CRRT for now and uncertain how efficacious fomepizole is assuming identified later in course of potential ingestion.   3. Uremia and Seizure: neurology consulted.   Pearson Grippe, MD [facility] pgr [phone]

## 2014-01-04 NOTE — Progress Notes (Signed)
  [facility] ADULT ICU REPLACEMENT PROTOCOL FOR AM LAB REPLACEMENT ONLY  The patient does not apply for the [facility] Adult ICU Electrolyte Replacment Protocol based on the criteria listed below:    Is GFR >/= 40 ml/min? no  Patient's GFR today is 12   4. Abnormal electrolyte(s): K3.6  6. If a panic level lab has been reported, has the CCM MD in charge been notified? yes.   Physician:  Patricia Nettle MD  Vear Clock 01/04/2014 5:11 AM

## 2014-01-04 NOTE — Progress Notes (Signed)
Name: Brady Hoffman MRN: [id number] DOB: [birth year]    ADMISSION DATE:  01/02/2014 CONSULTATION DATE: 01/02/14   PRIMARY SERVICE: PCCM   CHIEF COMPLAINT: Altered mental status, Seizure, Acute renal failure   BRIEF PATIENT DESCRIPTION:  48 years old male with Schizoaffective disorder, resident of [place] home. Was in his usual state of health and sustained a fall with head trauma. He then had a generalized seizure. At arrival to the ED on acute renal failure, intubated for airway protection.   SIGNIFICANT EVENTS / STUDIES:  2/1 CT abd/pelvis - unremarkable; LLL consolidation  2/1 CT Head - no acute intracranial abnormalities  2/1 CT C-spine - no acute cervical spine abnormalities  2/2 Renal u /s - No hydro 2/2 - Fomepizole started, EG back neg, dce'd 2/3 - EEG - No evidence of epileptiform discharges, No electrographic seizures noted. This is a normal asleep EEG. No electrographic seizures noted.  LINES / TUBES:  PIV 2/1>>>  LIJ 2/1>>>  R Femoral dialysis catheter 2/1>>>  ETT 2/1>>>  OGT 2/1>>>  Foley 2/1>>>   CULTURES:  MRSA negative  Blood 2/1 >>>  Urine 2/1>>> neg (resent)  ANTIBIOTICS:  Zosyn 2/2>>> 2/3  SUBJECTIVE: CVVHD, no sz seen on EEG, remains intubated, sedated  VITAL SIGNS: Temp:  [91 F (32.8 C)-98 F (36.7 C)] 91.1 F (32.8 C) (02/03 0500) Pulse Rate:  [44-74] 56 (02/03 0600) Resp:  [6-17] 12 (02/03 0600) BP: (90-167)/(63-96) 113/73 mmHg (02/03 0600) SpO2:  [100 %] 100 % (02/03 0600) FiO2 (%):  [30 %-40 %] 30 % (02/03 0600) Weight:  [92.9 kg (204 lb 12.9 oz)] 92.9 kg (204 lb 12.9 oz) (02/03 0251) HEMODYNAMICS:   VENTILATOR SETTINGS: Vent Mode:  [-] PRVC FiO2 (%):  [30 %-40 %] 30 % Set Rate:  [12 bmp-16 bmp] 12 bmp Vt Set:  [650 mL] 650 mL PEEP:  [5 cmH20] 5 cmH20 Plateau Pressure:  [14 cmH20-19 cmH20] 15 cmH20 INTAKE / OUTPUT: Intake/Output     02/02 0701 - 02/03 0700 02/03 0701 - 02/04 0700   I.V. (mL/kg) 3456 (37.2)    NG/GT      IV Piggyback 709.8    Total Intake(mL/kg) 4165.8 (44.8)    Urine (mL/kg/hr) 905 (0.4)    Emesis/NG output 200 (0.1)    Other 495 (0.2)    Total Output 1600     Net +2565.8           PHYSICAL EXAMINATION: General: Sedated, intubated, no acute distress.  Eyes: Anicteric sclerae. PERRL ENT: ETT in place. Trachea at midline.  Lymph: No cervical, supraclavicular, or axillary lymphadenopathy.  Heart: Normal S1, S2. No murmurs, rubs, or gallops appreciated. No bruits, equal pulses.  Lungs: Good air movement bilaterally, without wheezes or crackles.  Abdomen: Abdomen soft, non-tender and not distended, normoactive bowel sounds. No hepatosplenomegaly or masses.  Musculoskeletal: No clubbing or synovitis. No LE edema  Skin: No rashes or lesions  Neuro: Patient is unresponsive, less rigid  LABS:  CBC  Recent Labs Lab 01/03/14 0423 01/03/14 1600 01/03/14 1800 01/04/14 0342  WBC 6.6  --  5.0 3.7*  HGB 10.4*  --  10.3* 10.1*  HCT 27.8*  --  27.7* 27.2*  PLT 135* 121* 114* 123*   Coag's  Recent Labs Lab 01/02/14 1741 01/03/14 0423 01/03/14 1600 01/04/14 0342  APTT 28 31 32 38*  INR 1.32  --  1.15  --    BMET  Recent Labs Lab 01/03/14 0423 01/03/14 1600 01/04/14 0340  NA  140 143 140  K 4.2 3.9 3.6*  CL 101 104 102  CO2 17* 21 22  BUN 89* 54* 32*  CREATININE 13.26* 8.13* 5.23*  GLUCOSE 88 82 85   Electrolytes  Recent Labs Lab 01/03/14 0423 01/03/14 1330 01/03/14 1600 01/04/14 0050 01/04/14 0340 01/04/14 0342  CALCIUM 7.7*  --  7.7*  --  7.4*  --   MG 3.1* 2.7*  --  2.6*  --  2.5  PHOS 6.3* 4.3 4.3 2.8 3.0  --    Sepsis Markers  Recent Labs Lab 01/02/14 1755 01/03/14 1127 01/04/14 0342  LATICACIDVEN 10.98*  --   --   PROCALCITON  --  0.45 0.31   ABG  Recent Labs Lab 01/02/14 1809 01/03/14 0340 01/04/14 0243  PHART 7.179* 7.443 7.490*  PCO2ART 34.3* 25.7* 28.2*  PO2ART 476.0* 204.0* 143.0*   Liver Enzymes  Recent Labs Lab  01/02/14 1741 01/03/14 0423 01/03/14 1600 01/04/14 0340  AST 17  --   --   --   ALT 45  --   --   --   ALKPHOS 89  --   --   --   BILITOT 0.3  --   --   --   ALBUMIN 3.5 3.0* 2.8* 2.6*   Cardiac Enzymes  Recent Labs Lab 01/02/14 2205 01/03/14 0125 01/03/14 0754  TROPONINI <0.30 <0.30 <0.30   Glucose  Recent Labs Lab 01/03/14 0747 01/03/14 1210 01/03/14 1534 01/03/14 1906 01/04/14 0008 01/04/14 0412  GLUCAP 85 74 84 76 82 82    Imaging Ct Abdomen Pelvis Wo Contrast  01/02/2014   CLINICAL DATA:  Patient fell approximately 8 hr ago and hit head, developed seizure and mental status changes, intubated  EXAM: CT ABDOMEN AND PELVIS WITHOUT CONTRAST  TECHNIQUE: Multidetector CT imaging of the abdomen and pelvis was performed following the standard protocol without intravenous contrast.  COMPARISON:  None.  FINDINGS: There is left lower lobe consolidation. There is likely a small left effusion. There is beam artifact across lung base from positioning of the patient's arms. An NG tube extends with its tip just barely into the stomach.  Liver, spleen, pancreas, kidneys, and adrenal glands are normal. There are gallstones. Aorta is not dilated. Bowel is normal. No ascites. There is a Foley balloon catheter in the bladder. Reproductive organs are normal. No acute musculoskeletal findings.  IMPRESSION: 1. Left lower lobe consolidation and small left effusion. The possibility of aspiration pneumonitis cannot be excluded. 2. NG tube tip just barely into stomach.   Electronically Signed   By: Skipper Cliche M.D.   On: 01/02/2014 20:35   Ct Head Wo Contrast  01/02/2014   CLINICAL DATA:  Seizure this afternoon, fell earlier today, history hyperlipidemia, schizoaffective disorder  EXAM: CT HEAD WITHOUT CONTRAST  CT CERVICAL SPINE WITHOUT CONTRAST  TECHNIQUE: Multidetector CT imaging of the head and cervical spine was performed following the standard protocol without intravenous contrast. Multiplanar  CT image reconstructions of the cervical spine were also generated.  COMPARISON:  01/22/2007  FINDINGS: CT HEAD FINDINGS  Minimal atrophy.  Normal ventricular morphology.  No midline shift or mass effect.  Otherwise normal appearance of brain parenchyma.  No intracranial hemorrhage, mass lesion or evidence acute infarction.  No extra-axial fluid collections.  Bones and sinuses unremarkable.  CT CERVICAL SPINE FINDINGS  Skullbase intact.  Prevertebral soft tissues normal thickness.  Nasogastric tube coiled in hypopharynx.  Vertebral body and disc space heights maintained.  No acute fracture, subluxation or bone  destruction.  Soft tissues unremarkable.  IMPRESSION: No acute intracranial abnormalities.  No acute cervical spine abnormalities.   Electronically Signed   By: Lavonia Dana M.D.   On: 01/02/2014 18:07   Ct Cervical Spine Wo Contrast  01/02/2014   CLINICAL DATA:  Seizure this afternoon, fell earlier today, history hyperlipidemia, schizoaffective disorder  EXAM: CT HEAD WITHOUT CONTRAST  CT CERVICAL SPINE WITHOUT CONTRAST  TECHNIQUE: Multidetector CT imaging of the head and cervical spine was performed following the standard protocol without intravenous contrast. Multiplanar CT image reconstructions of the cervical spine were also generated.  COMPARISON:  01/22/2007  FINDINGS: CT HEAD FINDINGS  Minimal atrophy.  Normal ventricular morphology.  No midline shift or mass effect.  Otherwise normal appearance of brain parenchyma.  No intracranial hemorrhage, mass lesion or evidence acute infarction.  No extra-axial fluid collections.  Bones and sinuses unremarkable.  CT CERVICAL SPINE FINDINGS  Skullbase intact.  Prevertebral soft tissues normal thickness.  Nasogastric tube coiled in hypopharynx.  Vertebral body and disc space heights maintained.  No acute fracture, subluxation or bone destruction.  Soft tissues unremarkable.  IMPRESSION: No acute intracranial abnormalities.  No acute cervical spine abnormalities.    Electronically Signed   By: Lavonia Dana M.D.   On: 01/02/2014 18:07   US Renal  01/03/2014   CLINICAL DATA:  Small amount of perinephric fluid identified.  EXAM: RENAL/URINARY TRACT ULTRASOUND COMPLETE  COMPARISON:  None.  FINDINGS: Right Kidney:  Length: 11 cm. Mild increased cortical echogenicity. No mass or hydronephrosis visualized.  Left Kidney:  Length: 10.2 cm. Echogenicity within normal limits. No mass or hydronephrosis visualized.  Bladder:  Appears normal for degree of bladder distention.  IMPRESSION: 1. No hydronephrosis. 2. Mild increased cortical echogenicity of the right kidney.   Electronically Signed   By: Kerby Moors M.D.   On: 01/03/2014 14:41   Dg Pelvis Portable  01/02/2014   CLINICAL DATA:  Fall and seizures  EXAM: PORTABLE PELVIS 1-2 VIEWS  COMPARISON:  None.  FINDINGS: There is no evidence of pelvic fracture or diastasis. No other pelvic bone lesions are seen.  IMPRESSION: Negative.   Electronically Signed   By: Kathreen Devoid   On: 01/02/2014 17:54   Dg Chest Port 1 View  01/03/2014   CLINICAL DATA:  Check endotracheal tube position.  EXAM: PORTABLE CHEST - 1 VIEW  COMPARISON:  01/02/2014  FINDINGS: Endotracheal tube tip lies 5.3 cm above the carina. Left internal jugular central venous line and nasogastric tube are stable. There is mild medial lung base atelectasis. The lungs are otherwise clear. Cardiac silhouette is normal in size. Normal mediastinal contour.  IMPRESSION: Support apparatus is stable and well positioned.  Mild medial lung base atelectasis, increased from previous day's study.   Electronically Signed   By: Lajean Manes M.D.   On: 01/03/2014 07:11   Dg Chest Port 1 View  01/02/2014   CLINICAL DATA:  Left IJ central venous catheter  EXAM: PORTABLE CHEST - 1 VIEW  COMPARISON:  Chest x-ray from the same day at 5:32 p.m.  FINDINGS: Improved positioning of orogastric tube, now reaching the stomach. ETT ends at the level of the mid thoracic trachea. New left IJ catheter,  tip at the level of the upper SVC. No definitive pneumothorax. Streaky retrocardiac opacity appears stable from prior, most likely atelectasis based on intervening abdominal CT. Normal heart size.  IMPRESSION: 1. New left IJ catheter.  No adverse feature. 2. Improved positioning of orogastric tube. 3. Retrocardiac  atelectasis.   Electronically Signed   By: Jorje Guild M.D.   On: 01/02/2014 22:35   Dg Chest Port 1 View  01/02/2014   CLINICAL DATA:  Fall, seizures  EXAM: PORTABLE CHEST - 1 VIEW  COMPARISON:  None.  FINDINGS: There is an endotracheal tube with the tip 3.6 cm above the carina. There are low lung volumes. There is no focal consolidation, pleural effusion or pneumothorax. Normal cardiomediastinal silhouette. The osseous structures are unremarkable.  There is a nasogastric tube with the tip in the upper mid esophagus.  IMPRESSION: 1. Endotracheal tube with the tip 3.6 cm above the carina. 2. There is a nasogastric tube with the tip in the upper mid esophagus.   Electronically Signed   By: Kathreen Devoid   On: 01/02/2014 17:54    CXR: sig rotation to pt rt, left base hazziness, ett  ASSESSMENT / PLAN:   PULMONARY  A:  Aspiration pneumonia  Acute resp failure  P:  Continue mechanical ventilation  abg reviewed, reduce TV 7 cc/kg  wua / SBT goal cpap5 ps 5 2 hrs  unlikely to extubate  Neuro status prevents extubation currently pcx rin am for LLL re evaluation  CARDIOVASCULAR  A:  Bradycardia (on propofol)  Hypertensive with no known history - resolved Hyperlipidemia  P:  Continue IVF, NS'@150'   Loletha Grayer not clinically significant Hold statin for now  Continue hydralazine  RENAL  A:  Acute renal failure (no known renal disease) - SCr 00.86>>76.19>>5.09 AG metabolic acidosis (32>> 67>>12) -- gap closing, pure met acidosis now Hypokalemia CK, Total - 462  Lactic acid 45.80  Salicylate level WNL  Tylenol level WNL  Methanol Neg C3, C4 WNL HIV neg UA benign  Renal u/s  unremarkable UA negative for crystals P:  Nephrology following  Consider to dc CRRT  F/u ANA F/u ethylene glycol (NEG), he still may have had ingestion but has cleared while on CVVHD Continue NS  BID Renal panel per nephro  Daily Mg level  Drugs of abuse screen PENDING  GASTROINTESTINAL  A:  SUP  P:  Continue Protonix  Surgery states no intraabdominal cause for metabolic derangements, CT abd reviewed  Ensure TF started   HEMATOLOGIC  A:  Anemia  DVT ppx  P:  Follow CBC  HSQ   INFECTIOUS  A:  Aspiration pneumonia ?? Unimpressed pcxr overall and pct Afebrile, no leukocytosis  P:  Follow cultures  Trend fever curve PCT algorithim reassuring D/c zosyn  ENDOCRINE  A:  Hyperglycemia - controlled  P:  Continue Novolog SSI per ICU protocol  Follow CBG   NEUROLOGIC  A:  Schizoaffective disorder  Seizure s/p fall with head trauma  AMS (post ictal vs uremia)  ?Rigidity per mother - diff = seizures, dyskinesia, early NMS (no rahabdo, no fevers) R/o EG tox, suicide OD?  EEG - No electrographic seizures noted Ethylene glycol <5 (NEG) P:  Neurology consulted  Continue Keppra 500 mg IV BID  F/u UDS  Propofol for sedation, titrated down  Home psych meds on hold for now: clozapine, fluphenazine, venlafaxine, benztropine If fevers, consider dantrolene  Assess osm gap 25 ( REGARDLESS WITH arf, OSM GAP LIKELY GONE AND LEFT IS eg METABOLITES CAUSING AG = START EMPIRIC FOMEPIZOL EMPIRIC STAT  EtOH level pending Dc B vitamins pyridoxine, thiamine  Continue versed drip  D/c fomepizole  Would consider dantrolene challenge if rigidity noted again May need mri brain  Repeat cpk  TODAY'S SUMMARY: fomep dc,consider dc cvvhd, wean versed  to off if able, wua, sbt planned after [place], [place], Medical Student 01/04/2014 7:56 AM   I have personally obtained a history, examined the patient, evaluated laboratory and imaging results, formulated the assessment and plan  and placed orders. CRITICAL CARE: The patient is critically ill with multiple organ systems failure and requires high complexity decision making for assessment and support, frequent evaluation and titration of therapies, application of advanced monitoring technologies and extensive interpretation of multiple databases. Critical Care Time devoted to patient care services described in this note is 35 minutes.   Lavon Paganini. Titus Mould, MD, [place] Pgr: [facility]  Pulmonary and [place] Pager: [phone]  01/04/2014, 7:28 AM

## 2014-01-04 NOTE — Procedures (Signed)
EEG report. 48 y/o with schizoaffective disorder admitted to [facility] with AMS following a generalized seizure 01/02/14.  Brief clinical history:   Technique: this is a 17 channel routine scalp EEG performed at the bedside in the ICU setting, with bipolar and monopolar montages arranged in accordance to the international 10/20 system of electrode placement. One channel was dedicated to EKG recording.  The patient is intubated on the vent. No activating procedures.  Description: the entire study is characterized by an electrical pattern that seems to be consistent with normal sleep architecture intermixed with frequent periods of diffuse attenuation but not a frank burst suppression patten. No evidence of epileptiform discharges, No electrographic seizures noted.    EKG showed sinus rhythm.  Impression: this is a normal asleep EEG. No electrographic seizures noted.  Clinical correlation is advised.  Dorian Pod, MD

## 2014-01-05 ENCOUNTER — Inpatient Hospital Stay (HOSPITAL_COMMUNITY): Payer: Medicare Other

## 2014-01-05 LAB — CBC
HCT: 27.4 % — ABNORMAL LOW (ref 39.0–52.0)
HEMOGLOBIN: 9.7 g/dL — AB (ref 13.0–17.0)
MCH: 30.9 pg (ref 26.0–34.0)
MCHC: 35.4 g/dL (ref 30.0–36.0)
MCV: 87.3 fL (ref 78.0–100.0)
Platelets: 124 10*3/uL — ABNORMAL LOW (ref 150–400)
RBC: 3.14 MIL/uL — ABNORMAL LOW (ref 4.22–5.81)
RDW: 13.2 % (ref 11.5–15.5)
WBC: 6.7 10*3/uL (ref 4.0–10.5)

## 2014-01-05 LAB — GLUCOSE, CAPILLARY
GLUCOSE-CAPILLARY: 115 mg/dL — AB (ref 70–99)
Glucose-Capillary: 104 mg/dL — ABNORMAL HIGH (ref 70–99)
Glucose-Capillary: 131 mg/dL — ABNORMAL HIGH (ref 70–99)
Glucose-Capillary: 75 mg/dL (ref 70–99)
Glucose-Capillary: 83 mg/dL (ref 70–99)
Glucose-Capillary: 98 mg/dL (ref 70–99)

## 2014-01-05 LAB — BASIC METABOLIC PANEL
BUN: 30 mg/dL — AB (ref 6–23)
CALCIUM: 7.5 mg/dL — AB (ref 8.4–10.5)
CO2: 19 meq/L (ref 19–32)
CREATININE: 6.17 mg/dL — AB (ref 0.50–1.35)
Chloride: 106 mEq/L (ref 96–112)
GFR calc Af Amer: 11 mL/min — ABNORMAL LOW (ref >90)
GFR calc non Af Amer: 10 mL/min — ABNORMAL LOW (ref >90)
Glucose, Bld: 90 mg/dL (ref 70–99)
Potassium: 4 mEq/L (ref 3.7–5.3)
SODIUM: 144 meq/L (ref 137–147)

## 2014-01-05 LAB — LACTIC ACID, PLASMA: LACTIC ACID, VENOUS: 0.6 mmol/L (ref 0.5–2.2)

## 2014-01-05 LAB — MAGNESIUM
MAGNESIUM: 2.4 mg/dL (ref 1.5–2.5)
MAGNESIUM: 2.4 mg/dL (ref 1.5–2.5)

## 2014-01-05 LAB — APTT: aPTT: 36 seconds (ref 24–37)

## 2014-01-05 LAB — PHOSPHORUS: Phosphorus: 6.2 mg/dL — ABNORMAL HIGH (ref 2.3–4.6)

## 2014-01-05 LAB — PROCALCITONIN: Procalcitonin: 0.33 ng/mL

## 2014-01-05 NOTE — Progress Notes (Addendum)
Clinical Social Work Department BRIEF PSYCHOSOCIAL ASSESSMENT 01/05/2014  Patient:  Brady Hoffman, Brady Hoffman     Account Number:  [id number]     Admit date:  01/02/2014  Clinical Social Worker:  Freeman Caldron  Date/Time:  01/05/2014 01:19 PM  Referred by:  Physician  Date Referred:  01/05/2014 Referred for  Other - See comment   Other Referral:   Group home--[facility]   Interview type:  Family Other interview type:    PSYCHOSOCIAL DATA Living Status:  FACILITY Admitted from facility:  OTHER Level of care:  Group Home Primary support name:  Brady Hoffman ([phone]) Primary support relationship to patient:  PARENT Degree of support available:   Good--pt lives at [facility] group home.    CURRENT CONCERNS Current Concerns  Post-Acute Placement   Other Concerns:    SOCIAL WORK ASSESSMENT / PLAN CSW called pt's mother Brady Hoffman and explained CSW role in discharge back to group home. Brady Hoffman states pt lives at [facility] group home, and provided the phone number for the facility [phone]) and administrator Brady Hoffman [phone]). Brady Hoffman understanding of CSW role in discharge, stating she does not think her son will discharge anytime soon. CSW invited Brady Hoffman to call if she has any questions or concerns, and Brady Hoffman thanked CSW for assistance.CSW called facility and asked what paperwork they will need when pt is ready for discharge. Facility referred CSW to [employer] and CSW left a voicemail requesting a call back with a fax number and information about what clinicals they will need when pt is ready for discharge.  Addendum: CSW received call back from administrator with group home explaining pt does need FL2 with "domiciliary" status listed at the top and CSW to fax [facility] and discharge summary to [phone]. CSW placed updated FL2 on pt's chart.   Assessment/plan status:  Psychosocial Support/Ongoing Assessment of Needs Other assessment/ plan:   Information/referral to  community resources:   Group Home [facility])    PATIENT'S/FAMILY'S RESPONSE TO PLAN OF CARE: Good--pt's mother friendly and answered CSW questions, engaging in conversation with CSW. Brady Hoffman expressed understanding of CSW role and states she does not have additional needs CSW can address. CSW invited her to call if questions/concerns arise.       Brady Hoffman, MSW, [facility] Clinical Social Worker [phone]

## 2014-01-05 NOTE — Progress Notes (Addendum)
This CSW is covering for unit CSW--got a handoff on this pt that he is from a group home, but no mention of the name of facility. Assessment has not been completed. CSW called phone number listed in pt's chart and left a voicemail requesting a call back with name of group home for pt here in the hospital. CSW will complete full assessment when pt's contact returns call.  Addendum: CSW received return call from Morton Stall who states pt is from [facility] group home. Phone number for facility is [phone] and for administrator [name] is [phone]. CSW to complete full assessment with this information and details of conversation with Jana Half.  Ky Barban, MSW, [facility] Clinical Social Worker [phone]

## 2014-01-05 NOTE — Progress Notes (Signed)
Admit: 01/02/2014 LOS: 3  53M admit after found unconscious with profound renal failure (SCr 1.1 10/2013), increased anion gap metabolic acidosis, seizure, uremia, hyperkalemia.  AKI w/u: bland UA; C3/C4 WNL; HBV, HCV, HIV neg; renal US unremarkable, CK 462.  No contrast exposure.  Was not on Lithium.  Treated for toxic alcohol ingestion with CRRT and fomepizole but methanol and ethylene glycol levels negative (post CRRT), On CRRT 2/1-2/3.   Subjective:  CRRT stopped 01/04/14 Old Records obtained, SCr 1.1 10/2013.   PMD Med list included meloxicam  02/03 0701 - 02/04 0700 In: 4131.3 [I.V.:3421.7; NG/GT:504.6; IV Piggyback:205] Out: 9983 [Urine:1630]  Filed Weights   01/03/14 0400 01/04/14 0251 01/05/14 0442  Weight: 93 kg (205 lb 0.4 oz) 92.9 kg (204 lb 12.9 oz) 93.8 kg (206 lb 12.7 oz)    Current meds: reviewed  Current Labs: reviewed, C3 and C4 WNL, Serologies negative.    Renal US 01/03/14:  Normal sized, inc echogenicity, no obstruction   Physical Exam:  Blood pressure 130/81, pulse 103, temperature 97.3 F (36.3 C), temperature source Other (Comment), resp. rate 14, height 6' 2" (1.88 m), weight 93.8 kg (206 lb 12.7 oz), SpO2 100.00%. Intubated, agitated Tachy, regular, nl s1s2 Coarse bs b/l S/nt/nd. No LEE Foley in place No rashes/lesions  Assessment/Plan 1. Dialysis Dependent AKI: Unclear cause.  UA bland.  Renal US unremarkable.  Nonliguric.  Follow daily for RRT needs.  Hold today and follow renal function, UOP.  Exactly what happened remains unclear here.   2. Inc AG Met Acidosis: EG level (delayed) negative.  Methanol negative.  Had some lactate upon presentation but not sufficient to explain gap.  Was treated with CRRT/Fomepizole for concern of toxic alcohol.  Unusual acidoses such as D-Lactate or oxoproline acidosis theoretically possible, but seem very unlikely.  Would transition to NaHCO3 IVFs in place of NS today to treat acidosis.  Dialysis need assessed daily.     Pearson Grippe MD 01/05/2014, 8:11 AM   Recent Labs Lab 01/03/14 1600  01/04/14 0340  01/04/14 1117 01/04/14 2214 01/05/14 0130 01/05/14 0410  NA 143  --  140  --   --   --   --  144  K 3.9  --  3.6*  --   --   --   --  4.0  CL 104  --  102  --   --   --   --  106  CO2 21  --  22  --   --   --   --  19  GLUCOSE 82  --  85  --   --   --   --  90  BUN 54*  --  32*  --   --   --   --  30*  CREATININE 8.13*  --  5.23*  --   --   --   --  6.17*  CALCIUM 7.7*  --  7.4*  --   --   --   --  7.5*  PHOS 4.3  < > 3.0  < > 3.9 5.7* 6.2*  --   < > = values in this interval not displayed.  Recent Labs Lab 01/03/14 1800 01/04/14 0342 01/05/14 0410  WBC 5.0 3.7* 6.7  HGB 10.3* 10.1* 9.7*  HCT 27.7* 27.2* 27.4*  MCV 83.7 83.7 87.3  PLT 114* 123* 124*

## 2014-01-05 NOTE — Progress Notes (Signed)
Subjective: Remains intubated and on mechanical ventilation and sedated as needed with propofol. The no recurrence of seizure activity reported.  Objective: Current vital signs: BP 125/86  Pulse 91  Temp(Src) 97.6 F (36.4 C) (Oral)  Resp 12  Ht 6\' 2"  (1.88 m)  Wt 93.8 kg (206 lb 12.7 oz)  BMI 26.54 kg/m2  SpO2 100%  Neurologic Exam: Intubated and on mechanical ventilation. Patient has spontaneous respirations as well. He still moderately agitated requiring physical restraints. Propofol was discontinued earlier today. Pupils were equal and reacted normally to light. Extraocular movements were intact and conjugate with oculocephalic maneuvers. No facial weakness noted. Patient moved extremities equally with normal strength throughout. Deep tendon reflexes were 2+ and symmetrical. Plantar responses were mute bilaterally.  Medications: I have reviewed the patient's current medications.  Assessment/Plan: Generalized seizure on presentation was metabolic encephalopathy as well as history of falling with head trauma. Patient said no recurrence of seizure activity. He remains quite encephalopathic, most likely secondary to continued metabolic encephalopathy with renal failure. Close head injury may also be a contributing factor with his continued encephalopathic state, as well as possible diffuse hypoxic encephalopathy.  Recommend MRI of the brain without contrast when feasible. Continue Keppra 500 mg twice a day.  We will continue to follow this patient with you.  C.R. Nicole Kindred, MD [facility] [phone]  01/05/2014  1:19 PM

## 2014-01-05 NOTE — Progress Notes (Signed)
Name: Brady Hoffman MRN: [id number] DOB: [birth year]    ADMISSION DATE:  01/02/2014 CONSULTATION DATE: 01/02/14   PRIMARY SERVICE: PCCM   CHIEF COMPLAINT: Altered mental status, Seizure, Acute renal failure   BRIEF PATIENT DESCRIPTION:  48 years old male with Schizoaffective disorder, resident of [place] home. Was in his usual state of health and sustained a fall with head trauma. He then had a generalized seizure. At arrival to the ED on acute renal failure, intubated for airway protection.   SIGNIFICANT EVENTS / STUDIES:  2/1 CT abd/pelvis - unremarkable; LLL consolidation  2/1 CT Head - no acute intracranial abnormalities  2/1 CT C-spine - no acute cervical spine abnormalities  2/2 Renal u /s - No hydro  2/2 - Fomepizole started, EG back neg, dce'd  2/3 - EEG - No evidence of epileptiform discharges, No electrographic seizures noted. This is a normal asleep EEG. No electrographic seizures noted.  2/4- more aggitated  LINES / TUBES:  PIV 2/1>>>  LIJ 2/1>>>  R Femoral dialysis catheter 2/1>>>  ETT 2/1>>>  OGT 2/1>>>  Foley 2/1>>>   CULTURES:  MRSA negative  Blood 2/1 >>>  Urine 2/1>>> neg (resent)   ANTIBIOTICS:  Zosyn 2/2>>> 2/3   SUBJECTIVE:  no sz seen on EEG, remains intubated, sedated, some spontaneous movement  VITAL SIGNS: Temp:  [92.8 F (33.8 C)-99.1 F (37.3 C)] 97.3 F (36.3 C) (02/04 0000) Pulse Rate:  [61-109] 80 (02/04 0600) Resp:  [11-14] 12 (02/04 0600) BP: (90-140)/(59-81) 121/78 mmHg (02/04 0600) SpO2:  [97 %-100 %] 100 % (02/04 0600) FiO2 (%):  [30 %] 30 % (02/04 0600) Weight:  [93.8 kg (206 lb 12.7 oz)] 93.8 kg (206 lb 12.7 oz) (02/04 0442) HEMODYNAMICS:   VENTILATOR SETTINGS: Vent Mode:  [-] PRVC FiO2 (%):  [30 %] 30 % Set Rate:  [12 bmp] 12 bmp Vt Set:  [650 mL] 650 mL PEEP:  [5 cmH20] 5 cmH20 Plateau Pressure:  [12 cmH20-15 cmH20] 14 cmH20 INTAKE / OUTPUT: Intake/Output     02/03 0701 - 02/04 0700   I.V. (mL/kg) 3018.3  (32.2)   NG/GT 362.1   IV Piggyback 205   Total Intake(mL/kg) 3585.4 (38.2)   Urine (mL/kg/hr) 1455 (0.6)   Other 92 (0)   Total Output 1547   Net +2038.4         PHYSICAL EXAMINATION: General: Sedated rass -2 just prior to resedation, intubated, no acute distress.  Eyes: Anicteric sclerae. PERRL  ENT: ETT in place. Trachea at midline.  Lymph: No cervical, supraclavicular, or axillary lymphadenopathy.  Heart: Normal S1, S2. No murmurs, rubs, or gallops appreciated. No bruits, equal pulses.  Lungs: Good air movement bilaterally, without wheezes or crackles.  Abdomen: Abdomen soft, non-tender and not distended, normoactive bowel sounds. No hepatosplenomegaly or masses.  Musculoskeletal: No clubbing or synovitis. No LE edema  Skin: No rashes or lesions  Neuro: Patient is unresponsive, less rigid  LABS:  CBC  Recent Labs Lab 01/03/14 1800 01/04/14 0342 01/05/14 0410  WBC 5.0 3.7* 6.7  HGB 10.3* 10.1* 9.7*  HCT 27.7* 27.2* 27.4*  PLT 114* 123* 124*   Coag's  Recent Labs Lab 01/02/14 1741  01/03/14 1600 01/04/14 0342 01/05/14 0410  APTT 28  < > 32 38* 36  INR 1.32  --  1.15  --   --   < > = values in this interval not displayed. BMET  Recent Labs Lab 01/03/14 1600 01/04/14 0340 01/05/14 0410  NA 143 140 144  K 3.9 3.6* 4.0  CL 104 102 106  CO2 '21 22 19  ' BUN 54* 32* 30*  CREATININE 8.13* 5.23* 6.17*  GLUCOSE 82 85 90   Electrolytes  Recent Labs Lab 01/03/14 1600  01/04/14 0340  01/04/14 1117 01/04/14 2214 01/05/14 0130 01/05/14 0410  CALCIUM 7.7*  --  7.4*  --   --   --   --  7.5*  MG  --   < >  --   < > 2.4 2.4 2.4 2.4  PHOS 4.3  < > 3.0  < > 3.9 5.7* 6.2*  --   < > = values in this interval not displayed. Sepsis Markers  Recent Labs Lab 01/02/14 1755 01/03/14 1127 01/04/14 0342 01/05/14 0410  LATICACIDVEN 10.98*  --   --   --   PROCALCITON  --  0.45 0.31 0.33   ABG  Recent Labs Lab 01/02/14 1809 01/03/14 0340 01/04/14 0243    PHART 7.179* 7.443 7.490*  PCO2ART 34.3* 25.7* 28.2*  PO2ART 476.0* 204.0* 143.0*   Liver Enzymes  Recent Labs Lab 01/02/14 1741 01/03/14 0423 01/03/14 1600 01/04/14 0340  AST 17  --   --   --   ALT 45  --   --   --   ALKPHOS 89  --   --   --   BILITOT 0.3  --   --   --   ALBUMIN 3.5 3.0* 2.8* 2.6*   Cardiac Enzymes  Recent Labs Lab 01/02/14 2205 01/03/14 0125 01/03/14 0754  TROPONINI <0.30 <0.30 <0.30   Glucose  Recent Labs Lab 01/04/14 0742 01/04/14 1133 01/04/14 1534 01/04/14 1850 01/04/14 2351 01/05/14 0354  GLUCAP 76 84 84 80 75 83    Imaging US Renal  01/03/2014   CLINICAL DATA:  Small amount of perinephric fluid identified.  EXAM: RENAL/URINARY TRACT ULTRASOUND COMPLETE  COMPARISON:  None.  FINDINGS: Right Kidney:  Length: 11 cm. Mild increased cortical echogenicity. No mass or hydronephrosis visualized.  Left Kidney:  Length: 10.2 cm. Echogenicity within normal limits. No mass or hydronephrosis visualized.  Bladder:  Appears normal for degree of bladder distention.  IMPRESSION: 1. No hydronephrosis. 2. Mild increased cortical echogenicity of the right kidney.   Electronically Signed   By: Kerby Moors M.D.   On: 01/03/2014 14:41   Dg Chest Port 1 View  01/05/2014   CLINICAL DATA:  Question aspiration.  EXAM: PORTABLE CHEST - 1 VIEW  COMPARISON:  01/04/2014.  FINDINGS: Endotracheal tube ends in the upper thoracic trachea, nearly 10 cm above the carina. An enteric tube crosses the diaphragm. Left IJ catheter, tip near the SVC origin.  Improved aeration at the left base. The opacity at the left base is most likely atelectasis based on previous CT imaging. Stable heart size. No edema or pneumothorax.  IMPRESSION: 1. Interval retraction of endotracheal tube, now ending in the upper thoracic trachea (10 cm above the carina). 2. Partial clearing of left lower atelectasis.   Electronically Signed   By: Jorje Guild M.D.   On: 01/05/2014 05:41   Dg Chest Port 1  View  01/04/2014   CLINICAL DATA:  Evaluate endotracheal tube. Hyperlipidemia. GI bleed. Acute renal failure.  EXAM: PORTABLE CHEST - 1 VIEW  COMPARISON:  DG CHEST 1V PORT dated 01/03/2014  FINDINGS: Patient rotated right. Endotracheal tube 4.8 cm above carina. Left internal jugular line with tip at high SVC.  Normal heart size. No pleural effusion or pneumothorax. Right lung clear. Given differences in  technique and positioning, similar left base airspace disease.  IMPRESSION: Appropriate position of endotracheal tube.  Similar left base airspace disease. Favor atelectasis. Infection or aspiration difficult to exclude.   Electronically Signed   By: Abigail Miyamoto M.D.   On: 01/04/2014 07:39   CXR: improved aeration of left lower base, ett 9 cm above carina  ASSESSMENT / PLAN:   PULMONARY  A:  Aspiration pneumonia  Acute resp failure  P:  Continue mechanical ventilation, cpap5 ps 5, goal 2 hrs, became very aggitated concerns still exist for airway protection TV 7 cc/kg  wua / SBT again tomorrow am Neuro status prevents extubation currently  Advance ett  CARDIOVASCULAR  A:  Bradycardia (on propofol)  Hypertensive with no known history - resolved  Hyperlipidemia  P:  Continue IVF, NS'@100'    Hold statin for now  Continue hydralazine   RENAL  A:  Acute renal failure (no known renal disease) - SCr 87.56>>43.32>>9.51>>8.84 AG metabolic acidosis (16>> 60>>63>>01) -- pure met acidosis Hypokalemia --resolved CK, Total - 462 >> 167 Methanol Neg  Ethylene glycol Neg, clinical circumstances matches P:  Nephrology following   Continue NS @ 100 Recheck lactic with AG increase No role bicarb with residual AG process  GASTROINTESTINAL  A:  SUP  P:  Continue Protonix  ContinueTF started   HEMATOLOGIC  A:  Anemia  DVT ppx  P:  Follow CBC  HSQ   INFECTIOUS  A:  Afebrile, no leukocytosis  P:  Follow cultures  Trend fever curve  PCT algorithim reassuring  ENDOCRINE  A:   Hyperglycemia - controlled  P:  Continue Novolog SSI per ICU protocol  Follow CBG   NEUROLOGIC  A:  Schizoaffective disorder  Seizure s/p fall with head trauma  AMS (post ictal vs uremia)  ?Rigidity per mother - diff = seizures, dyskinesia, early NMS (no rahabdo, no fevers)  R/o EG tox, suicide OD?  EEG - No electrographic seizures noted  Ethylene glycol <5 (NEG)  UDS neg Repeat CK WNL - unlikely NMS P:  Continue Keppra 500 mg IV BID Propofol for sedation, titrated down, wua  Home psych meds on hold for now: clozapine, fluphenazine, venlafaxine, benztropine  Continue versed drip, with wua Would consider dantrolene challenge if rigidity noted again  May need mri brain  TODAY'S SUMMARY: more awake, agitated, crt 6, weaning well, need improved neurostatus  I have personally obtained a history, examined the patient, evaluated laboratory and imaging results, formulated the assessment and plan and placed orders. CRITICAL CARE: The patient is critically ill with multiple organ systems failure and requires high complexity decision making for assessment and support, frequent evaluation and titration of therapies, application of advanced monitoring technologies and extensive interpretation of multiple databases. Critical Care Time devoted to patient care services described in this note is 30 minutes.   Lavon Paganini. Titus Mould, MD, [place] Pgr: [facility]  Pulmonary and [place] Pager: [phone]  01/05/2014, 6:55 AM

## 2014-01-06 ENCOUNTER — Inpatient Hospital Stay (HOSPITAL_COMMUNITY): Payer: Medicare Other

## 2014-01-06 DIAGNOSIS — E785 Hyperlipidemia, unspecified: Secondary | ICD-10-CM

## 2014-01-06 LAB — BASIC METABOLIC PANEL
BUN: 37 mg/dL — ABNORMAL HIGH (ref 6–23)
CO2: 19 mEq/L (ref 19–32)
Calcium: 8 mg/dL — ABNORMAL LOW (ref 8.4–10.5)
Chloride: 111 mEq/L (ref 96–112)
Creatinine, Ser: 7.23 mg/dL — ABNORMAL HIGH (ref 0.50–1.35)
GFR calc Af Amer: 9 mL/min — ABNORMAL LOW (ref >90)
GFR calc non Af Amer: 8 mL/min — ABNORMAL LOW (ref >90)
Glucose, Bld: 119 mg/dL — ABNORMAL HIGH (ref 70–99)
POTASSIUM: 4.2 meq/L (ref 3.7–5.3)
SODIUM: 149 meq/L — AB (ref 137–147)

## 2014-01-06 LAB — CBC
HCT: 27.8 % — ABNORMAL LOW (ref 39.0–52.0)
HEMOGLOBIN: 9.7 g/dL — AB (ref 13.0–17.0)
MCH: 30.8 pg (ref 26.0–34.0)
MCHC: 34.9 g/dL (ref 30.0–36.0)
MCV: 88.3 fL (ref 78.0–100.0)
Platelets: 131 10*3/uL — ABNORMAL LOW (ref 150–400)
RBC: 3.15 MIL/uL — ABNORMAL LOW (ref 4.22–5.81)
RDW: 13.5 % (ref 11.5–15.5)
WBC: 6.9 10*3/uL (ref 4.0–10.5)

## 2014-01-06 LAB — GLUCOSE, CAPILLARY
GLUCOSE-CAPILLARY: 95 mg/dL (ref 70–99)
Glucose-Capillary: 113 mg/dL — ABNORMAL HIGH (ref 70–99)
Glucose-Capillary: 116 mg/dL — ABNORMAL HIGH (ref 70–99)
Glucose-Capillary: 111 mg/dL — ABNORMAL HIGH (ref 70–99)
Glucose-Capillary: 84 mg/dL (ref 70–99)
Glucose-Capillary: 98 mg/dL (ref 70–99)

## 2014-01-06 LAB — TRIGLYCERIDES: TRIGLYCERIDES: 176 mg/dL — AB (ref <150)

## 2014-01-06 MED ORDER — INFLUENZA VAC SPLIT QUAD 0.5 ML IM SUSP
0.5000 mL | INTRAMUSCULAR | Status: AC
  Administered 2014-01-07: 0.5 mL via INTRAMUSCULAR
  Filled 2014-01-06: qty 0.5

## 2014-01-06 MED ORDER — PNEUMOCOCCAL VAC POLYVALENT 25 MCG/0.5ML IJ INJ
0.5000 mL | INJECTION | INTRAMUSCULAR | Status: AC
  Administered 2014-01-07: 0.5 mL via INTRAMUSCULAR
  Filled 2014-01-06: qty 0.5

## 2014-01-06 MED ORDER — MIDAZOLAM HCL 2 MG/2ML IJ SOLN
1.0000 mg | INTRAMUSCULAR | Status: DC | PRN
  Administered 2014-01-06 – 2014-01-07 (×3): 1 mg via INTRAVENOUS
  Filled 2014-01-06 (×3): qty 2

## 2014-01-06 NOTE — Progress Notes (Signed)
Subjective: No improvement in mental status noted. Patient still requires restraints, including chemical restraint with propofol and Versed. He could not be weaned from ventilator yesterday and extubated because of mental status abnormalities. No recurrent seizure activity reported.  Objective: Current vital signs: BP 103/73  Pulse 95  Temp(Src) 98.9 F (37.2 C) (Oral)  Resp 10  Ht 6\' 2"  (1.88 m)  Wt 95.3 kg (210 lb 1.6 oz)  BMI 26.96 kg/m2  SpO2 98%  Neurologic Exam: Intubated and on mechanical ventilation. Patient is currently on propofol 40 mcg per kilogram per minute, and percent 2 mg per hour. He responds to noxious stimuli with grimacing. Pupils react minimally to light. He has spontaneous lateral eye movements, as well as intact to oculocephalic responses, although sluggish. No facial weakness noted. Muscle tone was flaccid throughout. No abnormal posturing was noted. Deep tendon reflexes were normal and symmetrical. Plantar responses were mute bilaterally.   Medications: I have reviewed the patient's current medications.  Assessment/Plan: No clear changes in patient's encephalopathic state noted. Metabolic encephalopathy is likely primary etiology for mental status changes. However, closed head injury may also be contributing factor. No seizure activity was seen on EEG on 01/03/2014. MRI of the brain without contrast is pending.  Recommend no changes in current management, including continuing Keppra at 500 mg every 12 hours. We will continue to follow this patient with you.  C.R. Nicole Kindred, MD [facility]  [phone]  01/06/2014  9:04 AM

## 2014-01-06 NOTE — Progress Notes (Signed)
Admit: 01/02/2014 LOS: 4  47M admit after found unconscious with profound renal failure (SCr 1.1 10/2013), increased anion gap metabolic acidosis, seizure, uremia, hyperkalemia.  AKI w/u: bland UA; C3/C4 WNL; HBV, HCV, HIV neg; renal US unremarkable, CK 462.  No contrast exposure.  Was not on Lithium.  Treated for toxic alcohol ingestion with CRRT and fomepizole but methanol and ethylene glycol levels negative (post CRRT), On CRRT 2/1-2/3.   Subjective:  Unable to extubate 2/2 AMS Does well on PSV, FIO2 30% UOP >3L yesterday HCO3 stable  02/04 0701 - 02/05 0700 In: 3800.3 [I.V.:1820.3; NG/GT:1770; IV Piggyback:210] Out: 3331 [Urine:3330; Stool:1]  Filed Weights   01/04/14 0251 01/05/14 0442 01/06/14 0243  Weight: 92.9 kg (204 lb 12.9 oz) 93.8 kg (206 lb 12.7 oz) 95.3 kg (210 lb 1.6 oz)    Current meds: reviewed  Current Labs: reviewed, C3 and C4 WNL, Serologies negative.    Renal US 01/03/14:  Normal sized, inc echogenicity, no obstruction   Physical Exam:  Blood pressure 103/73, pulse 95, temperature 98 F (36.7 C), temperature source Oral, resp. rate 10, height 6' 2" (1.88 m), weight 95.3 kg (210 lb 1.6 oz), SpO2 98.00%. Intubated, calm and sedated regular, nl s1s2 Coarse bs b/l S/nt/nd. No LEE Foley in place No rashes/lesions  Assessment/Plan 1. Dialysis Dependent AKI: Unclear cause.  UA bland.  Renal US unremarkable.  Nonliguric.  Follow daily for RRT needs.  Hold today and follow renal function, UOP.  Exactly what happened remains unclear here.   2. Inc AG Met Acidosis: EG level (delayed) negative.  Methanol negative.  Had some lactate upon presentation but not sufficient to explain gap.  Was treated with CRRT/Fomepizole for concern of toxic alcohol.  Unusual acidoses such as D-Lactate or oxoproline acidosis theoretically possible, but seem very unlikely.  Dialysis need assessed daily.   3. Mild hypernatremia:? If solute diuresis? Rec add free water to TFs if extubation not  planned today.    MD 01/06/2014, 8:15 AM   Recent Labs Lab 01/04/14 0340  01/04/14 1117 01/04/14 2214 01/05/14 0130 01/05/14 0410 01/06/14 0500  NA 140  --   --   --   --  144 149*  K 3.6*  --   --   --   --  4.0 4.2  CL 102  --   --   --   --  106 111  CO2 22  --   --   --   --  19 19  GLUCOSE 85  --   --   --   --  90 119*  BUN 32*  --   --   --   --  30* 37*  CREATININE 5.23*  --   --   --   --  6.17* 7.23*  CALCIUM 7.4*  --   --   --   --  7.5* 8.0*  PHOS 3.0  < > 3.9 5.7* 6.2*  --   --   < > = values in this interval not displayed.  Recent Labs Lab 01/04/14 0342 01/05/14 0410 01/06/14 0500  WBC 3.7* 6.7 6.9  HGB 10.1* 9.7* 9.7*  HCT 27.2* 27.4* 27.8*  MCV 83.7 87.3 88.3  PLT 123* 124* 131*              

## 2014-01-06 NOTE — Progress Notes (Signed)
Name: Brady Hoffman MRN: [id number] DOB: 1966/09/08    ADMISSION DATE:  01/02/2014 CONSULTATION DATE: 01/02/14   PRIMARY SERVICE: PCCM   CHIEF COMPLAINT: Altered mental status, Seizure, Acute renal failure   BRIEF PATIENT DESCRIPTION:  48 years old male with Schizoaffective disorder, resident of [facility] home. Was in his usual state of health and sustained a fall with head trauma. He then had a generalized seizure. At arrival to the ED on acute renal failure, intubated for airway protection.   SIGNIFICANT EVENTS / STUDIES:  2/1 CT abd/pelvis - unremarkable; LLL consolidation  2/1 CT Head - no acute intracranial abnormalities  2/1 CT C-spine - no acute cervical spine abnormalities  2/2 Renal u /s - No hydro  2/2 - Fomepizole started, EG back neg, dce'd  2/3 - EEG - No evidence of epileptiform discharges, No electrographic seizures noted. This is a normal asleep EEG. No electrographic seizures noted.  2/4- more aggitated  2/5 - SCr worse  2/5 MRI >>>  LINES / TUBES:  PIV 2/1>>>  LIJ 2/1>>>  R Femoral dialysis catheter 2/1>>>  ETT 2/1>>>  OGT 2/1>>>  Foley 2/1>>>   CULTURES:  MRSA negative  Blood 2/1 >>>  Urine 2/1>>> neg (resent)   ANTIBIOTICS:  Zosyn 2/2>>> 2/3   SUBJECTIVE: no sz seen on EEG, remains intubated, sedated, some spontaneous movement. Retry WUA / SBT today.   VITAL SIGNS: Temp:  [97.1 F (36.2 C)-98.7 F (37.1 C)] 98 F (36.7 C) (02/05 0354) Pulse Rate:  [75-111] 103 (02/05 0500) Resp:  [12-21] 14 (02/05 0500) BP: (96-143)/(58-95) 133/81 mmHg (02/05 0500) SpO2:  [99 %-100 %] 100 % (02/05 0500) FiO2 (%):  [30 %] 30 % (02/05 0500) Weight:  [95.3 kg (210 lb 1.6 oz)] 95.3 kg (210 lb 1.6 oz) (02/05 0243) HEMODYNAMICS:   VENTILATOR SETTINGS: Vent Mode:  [-] PRVC FiO2 (%):  [30 %] 30 % Set Rate:  [12 bmp] 12 bmp Vt Set:  [650 mL] 650 mL PEEP:  [5 cmH20] 5 cmH20 Pressure Support:  [5 cmH20] 5 cmH20 Plateau Pressure:  [9 cmH20-18 cmH20] 15  cmH20 INTAKE / OUTPUT: Intake/Output     02/04 0701 - 02/05 0700   I.V. (mL/kg) 1579.3 (16.6)   NG/GT 1630   IV Piggyback 210   Total Intake(mL/kg) 3419.3 (35.9)   Urine (mL/kg/hr) 3200 (1.4)   Total Output 3200   Net +219.3         PHYSICAL EXAMINATION: General: Sedated rass -2 just prior to resedation, intubated, no acute distress.  Eyes: Anicteric sclerae. PERRL  ENT: ETT in place. Trachea at midline.  Lymph: No cervical, supraclavicular, or axillary lymphadenopathy.  Heart: Normal S1, S2. No murmurs, rubs, or gallops appreciated. No bruits, equal pulses.  Lungs: Good air movement bilaterally, without wheezes or crackles.  Abdomen: Abdomen soft, non-tender and not distended, normoactive bowel sounds. No hepatosplenomegaly or masses.  Musculoskeletal: No clubbing or synovitis. No LE edema  Skin: No rashes or lesions  Neuro: Patient is unresponsive, less rigid  LABS:  CBC  Recent Labs Lab 01/04/14 0342 01/05/14 0410 01/06/14 0500  WBC 3.7* 6.7 6.9  HGB 10.1* 9.7* 9.7*  HCT 27.2* 27.4* 27.8*  PLT 123* 124* 131*   Coag's  Recent Labs Lab 01/02/14 1741  01/03/14 1600 01/04/14 0342 01/05/14 0410  APTT 28  < > 32 38* 36  INR 1.32  --  1.15  --   --   < > = values in this interval not displayed.  BMET  Recent Labs Lab 01/04/14 0340 01/05/14 0410 01/06/14 0500  NA 140 144 149*  K 3.6* 4.0 4.2  CL 102 106 111  CO2 '22 19 19  ' BUN 32* 30* 37*  CREATININE 5.23* 6.17* 7.23*  GLUCOSE 85 90 119*   Electrolytes  Recent Labs Lab 01/04/14 0340  01/04/14 1117 01/04/14 2214 01/05/14 0130 01/05/14 0410 01/06/14 0500  CALCIUM 7.4*  --   --   --   --  7.5* 8.0*  MG  --   < > 2.4 2.4 2.4 2.4  --   PHOS 3.0  < > 3.9 5.7* 6.2*  --   --   < > = values in this interval not displayed. Sepsis Markers  Recent Labs Lab 01/02/14 1755 01/03/14 1127 01/04/14 0342 01/05/14 0410 01/05/14 0933  LATICACIDVEN 10.98*  --   --   --  0.6  PROCALCITON  --  0.45 0.31 0.33   --    ABG  Recent Labs Lab 01/02/14 1809 01/03/14 0340 01/04/14 0243  PHART 7.179* 7.443 7.490*  PCO2ART 34.3* 25.7* 28.2*  PO2ART 476.0* 204.0* 143.0*   Liver Enzymes  Recent Labs Lab 01/02/14 1741 01/03/14 0423 01/03/14 1600 01/04/14 0340  AST 17  --   --   --   ALT 45  --   --   --   ALKPHOS 89  --   --   --   BILITOT 0.3  --   --   --   ALBUMIN 3.5 3.0* 2.8* 2.6*   Cardiac Enzymes  Recent Labs Lab 01/02/14 2205 01/03/14 0125 01/03/14 0754  TROPONINI <0.30 <0.30 <0.30   Glucose  Recent Labs Lab 01/05/14 0803 01/05/14 1126 01/05/14 1546 01/05/14 1914 01/05/14 2358 01/06/14 0352  GLUCAP 131* 115* 104* 98 95 113*    Imaging Dg Chest Port 1 View  01/05/2014   CLINICAL DATA:  Question aspiration.  EXAM: PORTABLE CHEST - 1 VIEW  COMPARISON:  01/04/2014.  FINDINGS: Endotracheal tube ends in the upper thoracic trachea, nearly 10 cm above the carina. An enteric tube crosses the diaphragm. Left IJ catheter, tip near the SVC origin.  Improved aeration at the left base. The opacity at the left base is most likely atelectasis based on previous CT imaging. Stable heart size. No edema or pneumothorax.  IMPRESSION: 1. Interval retraction of endotracheal tube, now ending in the upper thoracic trachea (10 cm above the carina). 2. Partial clearing of left lower atelectasis.   Electronically Signed   By: Jorje Guild M.D.   On: 01/05/2014 05:41    CXR: severe rotation left , given technique unlikley to change, ett wnl  ASSESSMENT / PLAN:   PULMONARY  A:  Aspiration pneumonia  Acute resp failure  P:  Continue mechanical ventilation, cpap5 ps 5, appears well, will repeat and dc propofol and observe Can wean on 15-20 propofol WUA / SBT again this am Neuro status a factor in extubation ETT in good position now Likely need ett for mRI  CARDIOVASCULAR  A:  Bradycardia (on propofol) - resolved Hypertensive with no known history - resolved  Hyperlipidemia  P:    Keep NS @ 100, consider reduction Hold statin for now  Continue hydralazine   RENAL  A:  Acute renal failure (no known renal disease) - SCr 55.97>>41.63>>8.45>>3.64>>6.80  AG metabolic acidosis (32>> 12>>24>>82>>50) -- pure met acidosis  Hypokalemia --resolved  CK, Total - 462 >>167  Methanol Neg Ethylene glycol Neg, clinical circumstances matches  Hypernatremia Lactic acid  cleared P:  Nephrology following  Continue NS, consider reduction, as oliguric, should we use lasix establish volume status Will defer decision to resume CRRT to nephrology --SCr continues to rise May need free water, lasi if started may reduce NA  GASTROINTESTINAL  A:  SUP  P:  Continue Protonix  Continue TF - hold for wean this am BM noted otday  HEMATOLOGIC  A:  Anemia - stable = 9.7 DVT ppx  P:  Follow CBC  HSQ   INFECTIOUS  A:  Afebrile, no leukocytosis  P:  All cultures negative to date Trend fever curve   ENDOCRINE  A:  Hyperglycemia - controlled  P:  Continue Novolog SSI per ICU protocol  Follow CBG   NEUROLOGIC  A:  Schizoaffective disorder  Seizure s/p fall with head trauma  AMS (post ictal vs uremia)  ?Rigidity per mother - diff = seizures, dyskinesia, early NMS (no rahabdo, no fevers)  R/o EG tox, suicide OD?  EEG - No electrographic seizures noted  Ethylene glycol <5 (NEG)  UDS neg  Repeat CK WNL - unlikely NMS  P:  Neurology following Continue Keppra 500 mg IV BID  Propofol for sedation, titrated down, wua  Home psych meds on hold for now: clozapine, fluphenazine, venlafaxine, benztropine  Dc versed Would consider dantrolene challenge if rigidity noted again  Will schedule MRI brain without contrast today per neuro recs (if CRRT not started)  TODAY'S SUMMARY: ?CRRT, MRI brain, extubate if agitation less and after MRI  MOM present on rounds,  Also attempted to call MD friend, ok by MOM  I have personally obtained a history, examined the patient, evaluated  laboratory and imaging results, formulated the assessment and plan and placed orders. CRITICAL CARE: The patient is critically ill with multiple organ systems failure and requires high complexity decision making for assessment and support, frequent evaluation and titration of therapies, application of advanced monitoring technologies and extensive interpretation of multiple databases. Critical Care Time devoted to patient care services described in this note is 30 minutes.   Lavon Paganini. Titus Mould, MD, FACP Pgr: [facility]  Pulmonary and [place] Pager: [phone]  01/06/2014, 6:54 AM

## 2014-01-06 NOTE — Progress Notes (Signed)
Placed on rest mode due to RN giving sedation.  Patient had no respiratory effort at this time.

## 2014-01-07 ENCOUNTER — Inpatient Hospital Stay (HOSPITAL_COMMUNITY): Payer: Medicare Other

## 2014-01-07 ENCOUNTER — Encounter (HOSPITAL_COMMUNITY): Payer: Self-pay | Admitting: *Deleted

## 2014-01-07 LAB — KAPPA/LAMBDA LIGHT CHAINS
KAPPA, LAMDA LIGHT CHAIN RATIO: 1.6 (ref 0.26–1.65)
Kappa free light chain: 3.41 mg/dL — ABNORMAL HIGH (ref 0.33–1.94)
Lambda free light chains: 2.13 mg/dL (ref 0.57–2.63)

## 2014-01-07 LAB — CBC
HCT: 25.5 % — ABNORMAL LOW (ref 39.0–52.0)
Hemoglobin: 8.9 g/dL — ABNORMAL LOW (ref 13.0–17.0)
MCH: 31.4 pg (ref 26.0–34.0)
MCHC: 34.9 g/dL (ref 30.0–36.0)
MCV: 90.1 fL (ref 78.0–100.0)
Platelets: 134 10*3/uL — ABNORMAL LOW (ref 150–400)
RBC: 2.83 MIL/uL — AB (ref 4.22–5.81)
RDW: 13.9 % (ref 11.5–15.5)
WBC: 5.8 10*3/uL (ref 4.0–10.5)

## 2014-01-07 LAB — BASIC METABOLIC PANEL
BUN: 39 mg/dL — ABNORMAL HIGH (ref 6–23)
BUN: 41 mg/dL — AB (ref 6–23)
CALCIUM: 8.3 mg/dL — AB (ref 8.4–10.5)
CO2: 19 meq/L (ref 19–32)
CO2: 20 mEq/L (ref 19–32)
CREATININE: 7.21 mg/dL — AB (ref 0.50–1.35)
Calcium: 7.9 mg/dL — ABNORMAL LOW (ref 8.4–10.5)
Chloride: 113 mEq/L — ABNORMAL HIGH (ref 96–112)
Chloride: 110 mEq/L (ref 96–112)
Creatinine, Ser: 7.45 mg/dL — ABNORMAL HIGH (ref 0.50–1.35)
GFR calc Af Amer: 9 mL/min — ABNORMAL LOW (ref >90)
GFR calc Af Amer: 9 mL/min — ABNORMAL LOW (ref >90)
GFR calc non Af Amer: 8 mL/min — ABNORMAL LOW (ref >90)
GFR, EST NON AFRICAN AMERICAN: 8 mL/min — AB (ref >90)
GLUCOSE: 109 mg/dL — AB (ref 70–99)
GLUCOSE: 104 mg/dL — AB (ref 70–99)
Potassium: 4.5 mEq/L (ref 3.7–5.3)
Potassium: 5 mEq/L (ref 3.7–5.3)
SODIUM: 150 meq/L — AB (ref 137–147)
SODIUM: 150 meq/L — AB (ref 137–147)

## 2014-01-07 LAB — GLUCOSE, CAPILLARY
GLUCOSE-CAPILLARY: 105 mg/dL — AB (ref 70–99)
GLUCOSE-CAPILLARY: 98 mg/dL (ref 70–99)
GLUCOSE-CAPILLARY: 105 mg/dL — AB (ref 70–99)
Glucose-Capillary: 105 mg/dL — ABNORMAL HIGH (ref 70–99)
Glucose-Capillary: 79 mg/dL (ref 70–99)
Glucose-Capillary: 97 mg/dL (ref 70–99)

## 2014-01-07 MED ORDER — DEXMEDETOMIDINE HCL IN NACL 400 MCG/100ML IV SOLN
0.4000 ug/kg/h | INTRAVENOUS | Status: DC
  Administered 2014-01-07: 1 ug/kg/h via INTRAVENOUS
  Administered 2014-01-07: 1.2 ug/kg/h via INTRAVENOUS
  Administered 2014-01-07 (×2): 0.9 ug/kg/h via INTRAVENOUS
  Administered 2014-01-07: 1.2 ug/kg/h via INTRAVENOUS
  Administered 2014-01-08: 0.6 ug/kg/h via INTRAVENOUS
  Administered 2014-01-08: 1 ug/kg/h via INTRAVENOUS
  Administered 2014-01-08 (×2): 1.2 ug/kg/h via INTRAVENOUS
  Administered 2014-01-09: 0.6 ug/kg/h via INTRAVENOUS
  Administered 2014-01-09 (×2): 1 ug/kg/h via INTRAVENOUS
  Administered 2014-01-09: 1.2 ug/kg/h via INTRAVENOUS
  Administered 2014-01-09: 0.602 ug/kg/h via INTRAVENOUS
  Administered 2014-01-10 – 2014-01-11 (×9): 1.2 ug/kg/h via INTRAVENOUS
  Filled 2014-01-07 (×2): qty 100
  Filled 2014-01-07: qty 50
  Filled 2014-01-07 (×21): qty 100

## 2014-01-07 MED ORDER — WHITE PETROLATUM GEL
Status: AC
  Administered 2014-01-07: 0.2
  Filled 2014-01-07: qty 5

## 2014-01-07 MED ORDER — HYDRALAZINE HCL 10 MG PO TABS
10.0000 mg | ORAL_TABLET | ORAL | Status: DC
  Filled 2014-01-07 (×12): qty 1

## 2014-01-07 MED ORDER — FOLIC ACID 1 MG PO TABS
1.0000 mg | ORAL_TABLET | Freq: Every day | ORAL | Status: DC
  Filled 2014-01-07 (×2): qty 1

## 2014-01-07 MED ORDER — MIDAZOLAM HCL 2 MG/2ML IJ SOLN
1.0000 mg | INTRAMUSCULAR | Status: DC | PRN
  Administered 2014-01-07 (×2): 1 mg via INTRAVENOUS
  Administered 2014-01-07: 2 mg via INTRAVENOUS
  Filled 2014-01-07 (×2): qty 2

## 2014-01-07 MED ORDER — DEXTROSE 5 % IV SOLN
INTRAVENOUS | Status: DC
  Administered 2014-01-07 – 2014-01-09 (×4): via INTRAVENOUS

## 2014-01-07 MED ORDER — PANTOPRAZOLE SODIUM 40 MG PO PACK
40.0000 mg | PACK | Freq: Every day | ORAL | Status: DC
  Filled 2014-01-07 (×2): qty 20

## 2014-01-07 NOTE — Progress Notes (Signed)
Admit: 01/02/2014 LOS: 5  18M admit after found unconscious with profound renal failure (SCr 1.1 10/2013), increased anion gap metabolic acidosis, seizure, uremia, hyperkalemia.  AKI w/u: bland UA; C3/C4 WNL; HBV, HCV, HIV neg; renal US unremarkable, CK 462.  No contrast exposure.  Was not on Lithium.  Treated for toxic alcohol ingestion with CRRT and fomepizole but methanol and ethylene glycol levels negative (post CRRT), On CRRT 2/1-2/3.   Subjective:  AMS still problem for extubation MRI this AM UOP excellent Only slight increases in SCr/BUN 30% FIO2, PEEP 5  02/05 0701 - 02/06 0700 In: 3961.2 [I.V.:2101.2; NG/GT:1610; IV Piggyback:205] Out: 7616 [Urine:2545]  Filed Weights   01/05/14 0442 01/06/14 0243 01/07/14 0205  Weight: 93.8 kg (206 lb 12.7 oz) 95.3 kg (210 lb 1.6 oz) 95.7 kg (210 lb 15.7 oz)    Current meds: reviewed  Current Labs: reviewed, C3 and C4 WNL, Serologies negative.    Renal US 01/03/14:  Normal sized, inc echogenicity, no obstruction   Physical Exam:  Blood pressure 100/58, pulse 87, temperature 98.5 F (36.9 C), temperature source Oral, resp. rate 12, height _0  (1.88 m), weight 95.7 kg (210 lb 15.7 oz), SpO2 97.00%. Intubated, calm and sedated regular, nl s1s2 Coarse bs b/l S/nt/nd. No LEE Foley in place No rashes/lesions  Assessment/Plan 1. Dialysis Dependent AKI: Unclear cause.  UA bland.  Renal US unremarkable.  Nonliguric.  Increasing evidence of some renal recovery.  Cont to follow closely, non indication for RRT.  Exactly what happened remains unclear here.   2. Inc AG Met Acidosis: EG level (delayed) negative.  Methanol negative.  Had some lactate upon presentation but not sufficient to explain gap.  Was treated with CRRT/Fomepizole for concern of toxic alcohol.  Unusual acidoses such as D-Lactate or oxoproline acidosis theoretically possible, but seem very unlikely.  Dialysis need assessed daily.   3. Mild hypernatremia:likely a solute diuresis?  Rec add free water to TFs if extubation not planned today.  Pearson Grippe MD 01/07/2014, 8:27 AM   Recent Labs Lab 01/04/14 1117 01/04/14 2214 01/05/14 0130 01/05/14 0410 01/06/14 0500 01/07/14 0410  NA  --   --   --  144 149* 150*  K  --   --   --  4.0 4.2 4.5  CL  --   --   --  106 111 113*  CO2  --   --   --  _1 GLUCOSE  --   --   --  90 119* 109*  BUN  --   --   --  30* 37* 39*  CREATININE  --   --   --  6.17* 7.23* 7.45*  CALCIUM  --   --   --  7.5* 8.0* 7.9*  PHOS 3.9 5.7* 6.2*  --   --   --     Recent Labs Lab 01/05/14 0410 01/06/14 0500 01/07/14 0410  WBC 6.7 6.9 5.8  HGB 9.7* 9.7* 8.9*  HCT 27.4* 27.8* 25.5*  MCV 87.3 88.3 90.1  PLT 124* 131* 134*

## 2014-01-07 NOTE — Procedures (Signed)
Extubation Procedure Note  Patient Details:   Name: Brady Hoffman DOB: 1966-04-29 MRN: [id number]   Airway Documentation:     Evaluation  O2 sats: stable throughout and currently acceptable Complications: No apparent complications Patient did tolerate procedure well. Bilateral Breath Sounds: Diminished Suctioning: Airway   Prior to extubation:  Pt suctioned orally, via ETT and subglottic tube.  Post-extubation: no stridor noted.  Miquel Dunn 01/07/2014, 12:31 PM

## 2014-01-07 NOTE — Progress Notes (Signed)
NUTRITION FOLLOW UP  Intervention:    If pt unable to take PO's, recommend utilization of 7M PEPuP Protocol: initiate TF via NGT with Vital AF 1.2 at 25 ml/h on day 1; on day 2, increase to goal rate of 70 ml/h (1680 ml per day) to provide 2016 kcals, 126 gm protein, 1362 ml free water daily.  If pt able to take PO's, recommend diet advancement per MD/SLP discretion. RD will continue to monitor oral intake and provide recommendations accordingly.  Nutrition Dx:   Inadequate oral intake related to inability to eat as evidenced by NPO status. Ongoing.  Goal:   Intake to meet >90% of estimated nutrition needs. Unmet.  Monitor:   weight trend, labs, ability to advance diet vs initiation of enteral nutrition  Assessment:   Patient with Schizoaffective disorder, resident of a group home. Was in his usual state of health and sustained a fall with head trauma. He then had a generalized seizure. At arrival to the ED had acute renal failure, intubated for airway protection.   EEG on 2/3 did not reveal seizures. CRRT 2/1 - 2/3. Renal following for daily RRT needs. Pt beginning to show evidence of some renal recovery, per MD. Good UOP.  Intubated 2/1. Extubated 2/6. Remains NPO at this time. Vital AF 1.2 still in orders, but is stopped presently.  Sodium elevated at 150 and trending up. Currently ordered for D5 at 75 ml/hr. Potassium and magnesium WNL. Phosphorus is elevated at 6.2. Blood sugars ranging from 79-105.   Height: Ht Readings from Last 1 Encounters:  01/02/14 6\' 2"  (1.88 m)    Weight Status:   Wt Readings from Last 1 Encounters:  01/07/14 210 lb 15.7 oz (95.7 kg)  01/03/14  205 lb 0.4 oz (93 kg)  Re-estimated needs:  Kcal: 1950 - 2150 kcal Protein: 100 - 125 grams Fluid: approx 2 liters daily  Skin: no wounds  Diet Order: NPO   Intake/Output Summary (Last 24 hours) at 01/07/14 1445 Last data filed at 01/07/14 1200  Gross per 24 hour  Intake 2984.4 ml  Output    2395 ml  Net  589.4 ml    Last BM: 2/6   Labs:   Recent Labs Lab 01/04/14 1117 01/04/14 2214 01/05/14 0130 01/05/14 0410 01/06/14 0500 01/07/14 0410  NA  --   --   --  144 149* 150*  K  --   --   --  4.0 4.2 4.5  CL  --   --   --  106 111 113*  CO2  --   --   --  19 19 19   BUN  --   --   --  30* 37* 39*  CREATININE  --   --   --  6.17* 7.23* 7.45*  CALCIUM  --   --   --  7.5* 8.0* 7.9*  MG 2.4 2.4 2.4 2.4  --   --   PHOS 3.9 5.7* 6.2*  --   --   --   GLUCOSE  --   --   --  90 119* 109*    CBG (last 3)   Recent Labs  01/07/14 0413 01/07/14 0809 01/07/14 1140  GLUCAP 105* 105* 79    Scheduled Meds: . antiseptic oral rinse  15 mL Mouth Rinse QID  . chlorhexidine  15 mL Mouth Rinse BID  . folic acid  1 mg Per Tube Daily  . heparin  5,000 Units Subcutaneous Q8H  . hydrALAZINE  10 mg Per Tube Q4H  . influenza vac split quadrivalent PF  0.5 mL Intramuscular Tomorrow-1000  . insulin aspart  1-3 Units Subcutaneous Q4H  . levETIRAcetam  500 mg Intravenous Q12H  . pantoprazole sodium  40 mg Per Tube Daily  . pneumococcal 23 valent vaccine  0.5 mL Intramuscular Tomorrow-1000  . sodium chloride  125 mL Intravenous Once    Continuous Infusions: . dexmedetomidine 0.9 mcg/kg/hr (01/07/14 1400)  . dextrose 75 mL/hr at 01/07/14 1112  . feeding supplement (VITAL AF 1.2 CAL) Stopped (01/07/14 0800)  . propofol Stopped (01/07/14 1200)   Inda Coke MS, RD, LDN Pager: [phone] After-hours pager: [phone]

## 2014-01-07 NOTE — Progress Notes (Signed)
Pt transported to CT from 2M09 and back to 2M09 while on ventilator.

## 2014-01-07 NOTE — Progress Notes (Signed)
Name: Brady Hoffman MRN: [id number] DOB: 25-May-1966    ADMISSION DATE:  01/02/2014 CONSULTATION DATE: 01/02/14   PRIMARY SERVICE: PCCM   CHIEF COMPLAINT: Altered mental status, Seizure, Acute renal failure   BRIEF PATIENT DESCRIPTION:  48 years old male with Schizoaffective disorder, resident of [place] home. Was in his usual state of health and sustained a fall with head trauma. He then had a generalized seizure. At arrival to the ED on acute renal failure, intubated for airway protection.   SIGNIFICANT EVENTS / STUDIES:  2/1 CT abd/pelvis - unremarkable; LLL consolidation  2/1 CT Head - no acute intracranial abnormalities  2/1 CT C-spine - no acute cervical spine abnormalities  2/2 Renal u /s - No hydro  2/2 - Fomepizole started, EG back neg, dce'd  2/3 - EEG - No evidence of epileptiform discharges, No electrographic seizures noted. This is a normal asleep EEG. No electrographic seizures noted.  2/4- more aggitated  2/5 - SCr worse  2/5 - too aggitated again for extubation 2/6 MRI >>>   LINES / TUBES:  PIV 2/1>>>  LIJ 2/1>>>  R Femoral dialysis catheter 2/1>>>  ETT 2/1>>>  OGT 2/1>>>  Foley 2/1>>> 2/5  CULTURES:  MRSA negative  Blood 2/1 >>> neg to date Urine 2/1>>> neg Urine 2/2>>> neg  ANTIBIOTICS:  Zosyn 2/2>>> 2/3   SUBJECTIVE: no sz seen on EEG, remains intubated, sedated, some spontaneous movement. Has been too agitated past 2 days during WUA. Retry WUA / SBT today. Foley d/c, failed TOV, I&O cath x 1.   VITAL SIGNS: Temp:  [98.1 F (36.7 C)-98.9 F (37.2 C)] 98.3 F (36.8 C) (02/06 0400) Pulse Rate:  [74-121] 87 (02/06 0700) Resp:  [7-21] 12 (02/06 0700) BP: (100-166)/(58-134) 100/58 mmHg (02/06 0700) SpO2:  [96 %-100 %] 97 % (02/06 0700) FiO2 (%):  [30 %] 30 % (02/06 0700) Weight:  [95.7 kg (210 lb 15.7 oz)] 95.7 kg (210 lb 15.7 oz) (02/06 0205) HEMODYNAMICS: CVP:  [4 mmHg] 4 mmHg VENTILATOR SETTINGS: Vent Mode:  [-] PRVC FiO2 (%):   [30 %] 30 % Set Rate:  [12 bmp] 12 bmp Vt Set:  [650 mL] 650 mL PEEP:  [5 cmH20] 5 cmH20 Pressure Support:  [5 cmH20] 5 cmH20 Plateau Pressure:  [14 cmH20-17 cmH20] 14 cmH20 INTAKE / OUTPUT: Intake/Output     02/05 0701 - 02/06 0700 02/06 0701 - 02/07 0700   I.V. (mL/kg) 2101.2 (22)    Other 45    NG/GT 1610    IV Piggyback 205    Total Intake(mL/kg) 3961.2 (41.4)    Urine (mL/kg/hr) 2545 (1.1)    Stool     Total Output 2545     Net +1416.2          Stool Occurrence 2 x      PHYSICAL EXAMINATION: General: Sedated rass -2, intubated, no acute distress.  Eyes: Anicteric sclerae. PERRL  ENT: ETT in place. Trachea at midline.  Lymph: No cervical, supraclavicular, or axillary lymphadenopathy.  Heart: Normal S1, S2. No murmurs, rubs, or gallops appreciated. No bruits, equal pulses.  Lungs: Good air movement bilaterally, without wheezes or crackles.  Abdomen: Abdomen soft, non-tender and not distended, normoactive bowel sounds. No hepatosplenomegaly or masses.  Musculoskeletal: No clubbing or synovitis. No LE edema  Skin: No rashes or lesions  Neuro: Patient is unresponsive, less rigid, RASS -2  LABS:  CBC  Recent Labs Lab 01/05/14 0410 01/06/14 0500 01/07/14 0410  WBC 6.7 6.9 5.8  HGB 9.7* 9.7* 8.9*  HCT 27.4* 27.8* 25.5*  PLT 124* 131* 134*   Coag's  Recent Labs Lab 01/02/14 1741  01/03/14 1600 01/04/14 0342 01/05/14 0410  APTT 28  < > 32 38* 36  INR 1.32  --  1.15  --   --   < > = values in this interval not displayed. BMET  Recent Labs Lab 01/05/14 0410 01/06/14 0500 01/07/14 0410  NA 144 149* 150*  K 4.0 4.2 4.5  CL 106 111 113*  CO2 '19 19 19  ' BUN 30* 37* 39*  CREATININE 6.17* 7.23* 7.45*  GLUCOSE 90 119* 109*   Electrolytes  Recent Labs Lab 01/04/14 1117 01/04/14 2214 01/05/14 0130 01/05/14 0410 01/06/14 0500 01/07/14 0410  CALCIUM  --   --   --  7.5* 8.0* 7.9*  MG 2.4 2.4 2.4 2.4  --   --   PHOS 3.9 5.7* 6.2*  --   --   --     Sepsis Markers  Recent Labs Lab 01/02/14 1755 01/03/14 1127 01/04/14 0342 01/05/14 0410 01/05/14 0933  LATICACIDVEN 10.98*  --   --   --  0.6  PROCALCITON  --  0.45 0.31 0.33  --    ABG  Recent Labs Lab 01/02/14 1809 01/03/14 0340 01/04/14 0243  PHART 7.179* 7.443 7.490*  PCO2ART 34.3* 25.7* 28.2*  PO2ART 476.0* 204.0* 143.0*   Liver Enzymes  Recent Labs Lab 01/02/14 1741 01/03/14 0423 01/03/14 1600 01/04/14 0340  AST 17  --   --   --   ALT 45  --   --   --   ALKPHOS 89  --   --   --   BILITOT 0.3  --   --   --   ALBUMIN 3.5 3.0* 2.8* 2.6*   Cardiac Enzymes  Recent Labs Lab 01/02/14 2205 01/03/14 0125 01/03/14 0754  TROPONINI <0.30 <0.30 <0.30   Glucose  Recent Labs Lab 01/06/14 0807 01/06/14 1158 01/06/14 1546 01/06/14 1857 01/06/14 2358 01/07/14 0413  GLUCAP 116* 111* 84 98 97 105*    Imaging Dg Chest Port 1 View  01/07/2014   CLINICAL DATA:  Intubated.  EXAM: PORTABLE CHEST - 1 VIEW  COMPARISON:  01/06/2014  FINDINGS: Endotracheal tube ends in the mid thoracic trachea. There is a left IJ catheter, tip near the SVC origin. Orogastric tube crosses the diaphragm.  No cardiomegaly given technique. Lung aeration improved from prior. No effusion or pneumothorax.  IMPRESSION: 1. Tubes and lines remain in good position. 2. Improving lung aeration.   Electronically Signed   By: Jorje Guild M.D.   On: 01/07/2014 06:07   Dg Chest Port 1 View  01/06/2014   CLINICAL DATA:  Intubation .  EXAM: PORTABLE CHEST - 1 VIEW  COMPARISON:  DG CHEST 1V PORT dated 01/05/2014 .  FINDINGS: Endotracheal tube tip 3.7 cm above the carina. Left IJ line and NG tube in stable position. Patient is rotated to the left making evaluation difficult. Bibasilar atelectasis and/or infiltrates are present. Heart size and pulmonary vascularity normal. No pleural effusion or pneumothorax. No acute osseous abnormality.  IMPRESSION: 1. Endotracheal tube tip noted 3.7 cm above the carina.  Left IJ line and NG tube in stable position. 2. Bibasilar atelectasis and/or pneumonia.   Electronically Signed   By: Marcello Moores  Register   On: 01/06/2014 07:47    CXR: ett wnl, no infiltrates  ASSESSMENT / PLAN:   PULMONARY  A:  Aspiration pneumonia  Acute resp failure  P:  Continue mechanical ventilation, cpap5 ps 5, appears well, re eval for extubation post MRI, eval RSBI, TV Can wean on 15-20 propofol  WUA / SBT again this am  Neuro status a factor in extubation  No MRI yesterday, will have to wait til MRI today to extubate  CARDIOVASCULAR  A:  Bradycardia (on propofol) - resolved  Hypertensive with no known history  Hyperlipidemia  CVP 4 P:  D/c NS, start  Free water Hold statin for now  Continue hydralazine   RENAL  A:  ARF (no known history) - SCr 03.52>>48.18>>5.90>>9.31>>1.21>>6.24  AG metabolic acidosis (46>> 95>>07>>22>>57>>50) -- pure met acidosis  Hypokalemia --resolved  CK, Total - 462 >>167  Methanol Neg  Ethylene glycol Neg, clinical circumstances matches  Hypernatremia 2/2 solute diuresis Lactic acid cleared  P:  Nephrology following  D/c NS, start free water 200cc Q8H for hyperNa and solute diuresis, likely needs d5w Lasix also an option but CVP 4 Follow BMET while on free water Will defer decision to resume CRRT to nephrology --SCr continues to rise  Correct free water deficit and re eval crt trend  GASTROINTESTINAL  A:  SUP  Last BM 2/5 P:  Continue Protonix  Continue TF - hold for wean after MRI  HEMATOLOGIC  A:  Anemia - downtrending (8.9) DVT ppx  P:  Check FOBT Follow CBC  HSQ   INFECTIOUS  A:  Afebrile, no leukocytosis  P:  All cultures negative to date  Trend fever curve   ENDOCRINE  A:  Hyperglycemia - controlled  P:  Continue Novolog SSI per ICU protocol  Follow CBG   NEUROLOGIC  A:  Schizoaffective disorder  Seizure s/p fall with head trauma  AMS (post ictal vs uremia vs drug OD / toxin  vs home regimen SE)   ?Rigidity resolved (never had fevers rhabdo) R/o EG tox, suicide OD?  EEG - No electrographic seizures noted  Ethylene glycol <5 (NEG)  UDS neg  P:  Neurology following  Continue Keppra 500 mg IV BID  Propofol for sedation, titrated down, WUA Home psych meds on hold for now: clozapine, fluphenazine, venlafaxine, benztropine  Would consider dantrolene challenge if rigidity noted again or high fevers MRI now  TODAY'S SUMMARY: WUA, then extubate after MRI likley  I have personally obtained a history, examined the patient, evaluated laboratory and imaging results, formulated the assessment and plan and placed orders. CRITICAL CARE: The patient is critically ill with multiple organ systems failure and requires high complexity decision making for assessment and support, frequent evaluation and titration of therapies, application of advanced monitoring technologies and extensive interpretation of multiple databases. Critical Care Time devoted to patient care services described in this note is 30 minutes.   Lavon Paganini. Titus Mould, MD, [place] Pgr: [facility]  Pulmonary and [place] Pager: [phone]  01/07/2014, 7:23 AM

## 2014-01-08 ENCOUNTER — Encounter (HOSPITAL_COMMUNITY): Payer: Self-pay | Admitting: Psychiatry

## 2014-01-08 LAB — BASIC METABOLIC PANEL
BUN: 44 mg/dL — ABNORMAL HIGH (ref 6–23)
BUN: 47 mg/dL — AB (ref 6–23)
CHLORIDE: 110 meq/L (ref 96–112)
CHLORIDE: 108 meq/L (ref 96–112)
CO2: 18 mEq/L — ABNORMAL LOW (ref 19–32)
CO2: 15 meq/L — AB (ref 19–32)
CREATININE: 6.92 mg/dL — AB (ref 0.50–1.35)
Calcium: 8.2 mg/dL — ABNORMAL LOW (ref 8.4–10.5)
Calcium: 8.2 mg/dL — ABNORMAL LOW (ref 8.4–10.5)
Creatinine, Ser: 7.15 mg/dL — ABNORMAL HIGH (ref 0.50–1.35)
GFR calc Af Amer: 10 mL/min — ABNORMAL LOW (ref >90)
GFR calc non Af Amer: 8 mL/min — ABNORMAL LOW (ref >90)
GFR calc non Af Amer: 8 mL/min — ABNORMAL LOW (ref >90)
GFR, EST AFRICAN AMERICAN: 9 mL/min — AB (ref >90)
GLUCOSE: 88 mg/dL (ref 70–99)
Glucose, Bld: 77 mg/dL (ref 70–99)
POTASSIUM: 5 meq/L (ref 3.7–5.3)
POTASSIUM: 4.8 meq/L (ref 3.7–5.3)
Sodium: 149 mEq/L — ABNORMAL HIGH (ref 137–147)
Sodium: 148 mEq/L — ABNORMAL HIGH (ref 137–147)

## 2014-01-08 LAB — GLUCOSE, CAPILLARY
GLUCOSE-CAPILLARY: 89 mg/dL (ref 70–99)
GLUCOSE-CAPILLARY: 93 mg/dL (ref 70–99)
GLUCOSE-CAPILLARY: 94 mg/dL (ref 70–99)
Glucose-Capillary: 70 mg/dL (ref 70–99)
Glucose-Capillary: 85 mg/dL (ref 70–99)
Glucose-Capillary: 98 mg/dL (ref 70–99)

## 2014-01-08 LAB — CBC
HCT: 26.5 % — ABNORMAL LOW (ref 39.0–52.0)
HEMOGLOBIN: 8.9 g/dL — AB (ref 13.0–17.0)
MCH: 30.1 pg (ref 26.0–34.0)
MCHC: 33.6 g/dL (ref 30.0–36.0)
MCV: 89.5 fL (ref 78.0–100.0)
Platelets: 141 10*3/uL — ABNORMAL LOW (ref 150–400)
RBC: 2.96 MIL/uL — ABNORMAL LOW (ref 4.22–5.81)
RDW: 13.3 % (ref 11.5–15.5)
WBC: 10 10*3/uL (ref 4.0–10.5)

## 2014-01-08 MED ORDER — PANTOPRAZOLE SODIUM 40 MG IV SOLR
40.0000 mg | Freq: Every day | INTRAVENOUS | Status: DC
  Administered 2014-01-08 – 2014-01-14 (×6): 40 mg via INTRAVENOUS
  Filled 2014-01-08 (×8): qty 40

## 2014-01-08 MED ORDER — FOLIC ACID 5 MG/ML IJ SOLN
1.0000 mg | Freq: Every day | INTRAMUSCULAR | Status: DC
  Administered 2014-01-08 – 2014-01-13 (×6): 1 mg via INTRAVENOUS
  Filled 2014-01-08 (×7): qty 0.2

## 2014-01-08 MED ORDER — LEVETIRACETAM 100 MG/ML PO SOLN
500.0000 mg | Freq: Two times a day (BID) | ORAL | Status: DC
  Filled 2014-01-08 (×2): qty 5

## 2014-01-08 MED ORDER — HYDRALAZINE HCL 20 MG/ML IJ SOLN
20.0000 mg | Freq: Three times a day (TID) | INTRAMUSCULAR | Status: DC
  Administered 2014-01-08 – 2014-01-09 (×5): 20 mg via INTRAVENOUS
  Filled 2014-01-08 (×4): qty 1

## 2014-01-08 MED ORDER — SODIUM CHLORIDE 0.9 % IV SOLN
500.0000 mg | Freq: Two times a day (BID) | INTRAVENOUS | Status: DC
  Administered 2014-01-08 – 2014-01-12 (×10): 500 mg via INTRAVENOUS
  Filled 2014-01-08 (×13): qty 5

## 2014-01-08 NOTE — Consult Note (Signed)
[facility] Face-to-Face Psychiatry Consult   Reason for Consult:  Medication management Referring Physician:  Dr. Adriana Simas Brady Hoffman is an 48 y.o. male. Total Time spent with patient: 30 minutes  Assessment: AXIS I:  Schizoaffective Disorder AXIS II:  Deferred AXIS III:   Past Medical History  Diagnosis Date  . Hyperlipidemia   . Schizoaffective disorder   . H/O: GI bleed   . Seizures   . Depression   . Chronic kidney disease    AXIS IV:  other psychosocial or environmental problems, problems related to social environment and problems with primary support group AXIS V:  41-50 serious symptoms  Plan:  Patient does not meet criteria for psychiatric inpatient admission.  Subjective:   Brady Hoffman is a 48 y.o. male patient needs to continue with medical assistance at this point due to his head injury.  His psychiatric medications can be restarted Cogentin 2 mg at bedtime to prevent EPS, Clozaril 100 mg at 8 am and 1 pm and 400 mg at bedtime, and Effexor 225 mg daily for depression.  His Prolixin is not recommended to restart.  Dr. Dwyane Dee, psychiatrist, reviewed the patient and concurs with the findings.  HPI:   Patient has schizoaffective disorder and was residing in a group home until he fell and hit his head causing seizures.  The patient was stabilized and ventilated until today.  He is currently posturing with his arms but his knees are bent. HPI Elements:   Location:  generalized. Quality:  acute. Severity:  severe. Timing:  constant. Duration:  since his head injury. Context:  head injuries.  Past Psychiatric History: Past Medical History  Diagnosis Date  . Hyperlipidemia   . Schizoaffective disorder   . H/O: GI bleed   . Seizures   . Depression   . Chronic kidney disease     reports that he has never smoked. He has never used smokeless tobacco. He reports that he does not drink alcohol or use illicit drugs. History reviewed. No pertinent family history.         Allergies:   Allergies  Allergen Reactions  . Risperidone And Related     Pt. States head feels like a rock      Objective: Blood pressure 143/95, pulse 114, temperature 98.7 F (37.1 C), temperature source Oral, resp. rate 26, height _0  (1.88 m), weight 200 lb 9.9 oz (91 kg), SpO2 97.00%.Body mass index is 25.75 kg/(m^2). Results for orders placed during the hospital encounter of 01/02/14 (from the past 72 hour(s))  GLUCOSE, CAPILLARY     Status: None   Collection Time    01/05/14 11:58 PM      Result Value Range   Glucose-Capillary 95  70 - 99 mg/dL  GLUCOSE, CAPILLARY     Status: Abnormal   Collection Time    01/06/14  3:52 AM      Result Value Range   Glucose-Capillary 113 (*) 70 - 99 mg/dL  BASIC METABOLIC PANEL     Status: Abnormal   Collection Time    01/06/14  5:00 AM      Result Value Range   Sodium 149 (*) 137 - 147 mEq/L   Potassium 4.2  3.7 - 5.3 mEq/L   Chloride 111  96 - 112 mEq/L   CO2 19  19 - 32 mEq/L   Glucose, Bld 119 (*) 70 - 99 mg/dL   BUN 37 (*) 6 - 23 mg/dL   Creatinine, Ser 7.23 (*) 0.50 - 1.35  mg/dL   Calcium 8.0 (*) 8.4 - 10.5 mg/dL   GFR calc non Af Amer 8 (*) >90 mL/min   GFR calc Af Amer 9 (*) >90 mL/min   Comment: (NOTE)     The eGFR has been calculated using the CKD EPI equation.     This calculation has not been validated in all clinical situations.     eGFR's persistently <90 mL/min signify possible Chronic Kidney     Disease.  CBC     Status: Abnormal   Collection Time    01/06/14  5:00 AM      Result Value Range   WBC 6.9  4.0 - 10.5 K/uL   RBC 3.15 (*) 4.22 - 5.81 MIL/uL   Hemoglobin 9.7 (*) 13.0 - 17.0 g/dL   HCT 27.8 (*) 39.0 - 52.0 %   MCV 88.3  78.0 - 100.0 fL   MCH 30.8  26.0 - 34.0 pg   MCHC 34.9  30.0 - 36.0 g/dL   RDW 13.5  11.5 - 15.5 %   Platelets 131 (*) 150 - 400 K/uL  GLUCOSE, CAPILLARY     Status: Abnormal   Collection Time    01/06/14  8:07 AM      Result Value Range   Glucose-Capillary 116 (*) 70 -  99 mg/dL  KAPPA/LAMBDA LIGHT CHAINS     Status: Abnormal   Collection Time    01/06/14  8:30 AM      Result Value Range   Kappa free light chain 3.41 (*) 0.33 - 1.94 mg/dL   Lamda free light chains 2.13  0.57 - 2.63 mg/dL   Kappa, lamda light chain ratio 1.60  0.26 - 1.65   Comment: Performed at [facility], CAPILLARY     Status: Abnormal   Collection Time    01/06/14 11:58 AM      Result Value Range   Glucose-Capillary 111 (*) 70 - 99 mg/dL  GLUCOSE, CAPILLARY     Status: None   Collection Time    01/06/14  3:46 PM      Result Value Range   Glucose-Capillary 84  70 - 99 mg/dL  TRIGLYCERIDES     Status: Abnormal   Collection Time    01/06/14  6:00 PM      Result Value Range   Triglycerides 176 (*) <150 mg/dL  GLUCOSE, CAPILLARY     Status: None   Collection Time    01/06/14  6:57 PM      Result Value Range   Glucose-Capillary 98  70 - 99 mg/dL  GLUCOSE, CAPILLARY     Status: None   Collection Time    01/06/14 11:58 PM      Result Value Range   Glucose-Capillary 97  70 - 99 mg/dL  BASIC METABOLIC PANEL     Status: Abnormal   Collection Time    01/07/14  4:10 AM      Result Value Range   Sodium 150 (*) 137 - 147 mEq/L   Potassium 4.5  3.7 - 5.3 mEq/L   Chloride 113 (*) 96 - 112 mEq/L   CO2 19  19 - 32 mEq/L   Glucose, Bld 109 (*) 70 - 99 mg/dL   BUN 39 (*) 6 - 23 mg/dL   Creatinine, Ser 7.45 (*) 0.50 - 1.35 mg/dL   Calcium 7.9 (*) 8.4 - 10.5 mg/dL   GFR calc non Af Amer 8 (*) >90 mL/min   GFR calc Af Amer 9 (*) >  90 mL/min   Comment: (NOTE)     The eGFR has been calculated using the CKD EPI equation.     This calculation has not been validated in all clinical situations.     eGFR's persistently <90 mL/min signify possible Chronic Kidney     Disease.  CBC     Status: Abnormal   Collection Time    01/07/14  4:10 AM      Result Value Range   WBC 5.8  4.0 - 10.5 K/uL   RBC 2.83 (*) 4.22 - 5.81 MIL/uL   Hemoglobin 8.9 (*) 13.0 - 17.0 g/dL   HCT  25.5 (*) 39.0 - 52.0 %   MCV 90.1  78.0 - 100.0 fL   MCH 31.4  26.0 - 34.0 pg   MCHC 34.9  30.0 - 36.0 g/dL   RDW 13.9  11.5 - 15.5 %   Platelets 134 (*) 150 - 400 K/uL  GLUCOSE, CAPILLARY     Status: Abnormal   Collection Time    01/07/14  4:13 AM      Result Value Range   Glucose-Capillary 105 (*) 70 - 99 mg/dL   Comment 1 Documented in Chart     Comment 2 Notify RN    GLUCOSE, CAPILLARY     Status: Abnormal   Collection Time    01/07/14  8:09 AM      Result Value Range   Glucose-Capillary 105 (*) 70 - 99 mg/dL  GLUCOSE, CAPILLARY     Status: None   Collection Time    01/07/14 11:40 AM      Result Value Range   Glucose-Capillary 79  70 - 99 mg/dL  GLUCOSE, CAPILLARY     Status: None   Collection Time    01/07/14  3:38 PM      Result Value Range   Glucose-Capillary 98  70 - 99 mg/dL  BASIC METABOLIC PANEL     Status: Abnormal   Collection Time    01/07/14  5:00 PM      Result Value Range   Sodium 150 (*) 137 - 147 mEq/L   Potassium 5.0  3.7 - 5.3 mEq/L   Chloride 110  96 - 112 mEq/L   CO2 20  19 - 32 mEq/L   Glucose, Bld 104 (*) 70 - 99 mg/dL   BUN 41 (*) 6 - 23 mg/dL   Creatinine, Ser 7.21 (*) 0.50 - 1.35 mg/dL   Calcium 8.3 (*) 8.4 - 10.5 mg/dL   GFR calc non Af Amer 8 (*) >90 mL/min   GFR calc Af Amer 9 (*) >90 mL/min   Comment: (NOTE)     The eGFR has been calculated using the CKD EPI equation.     This calculation has not been validated in all clinical situations.     eGFR's persistently <90 mL/min signify possible Chronic Kidney     Disease.  GLUCOSE, CAPILLARY     Status: Abnormal   Collection Time    01/07/14  6:58 PM      Result Value Range   Glucose-Capillary 105 (*) 70 - 99 mg/dL  GLUCOSE, CAPILLARY     Status: None   Collection Time    01/07/14 11:54 PM      Result Value Range   Glucose-Capillary 98  70 - 99 mg/dL  GLUCOSE, CAPILLARY     Status: None   Collection Time    01/08/14  3:56 AM      Result Value Range  Glucose-Capillary 89  70 - 99  mg/dL  CBC     Status: Abnormal   Collection Time    01/08/14  4:22 AM      Result Value Range   WBC 10.0  4.0 - 10.5 K/uL   RBC 2.96 (*) 4.22 - 5.81 MIL/uL   Hemoglobin 8.9 (*) 13.0 - 17.0 g/dL   HCT 26.5 (*) 39.0 - 52.0 %   MCV 89.5  78.0 - 100.0 fL   MCH 30.1  26.0 - 34.0 pg   MCHC 33.6  30.0 - 36.0 g/dL   RDW 13.3  11.5 - 15.5 %   Platelets 141 (*) 150 - 400 K/uL  BASIC METABOLIC PANEL     Status: Abnormal   Collection Time    01/08/14  4:22 AM      Result Value Range   Sodium 149 (*) 137 - 147 mEq/L   Potassium 5.0  3.7 - 5.3 mEq/L   Chloride 110  96 - 112 mEq/L   CO2 18 (*) 19 - 32 mEq/L   Glucose, Bld 88  70 - 99 mg/dL   BUN 44 (*) 6 - 23 mg/dL   Creatinine, Ser 7.15 (*) 0.50 - 1.35 mg/dL   Calcium 8.2 (*) 8.4 - 10.5 mg/dL   GFR calc non Af Amer 8 (*) >90 mL/min   GFR calc Af Amer 9 (*) >90 mL/min   Comment: (NOTE)     The eGFR has been calculated using the CKD EPI equation.     This calculation has not been validated in all clinical situations.     eGFR's persistently <90 mL/min signify possible Chronic Kidney     Disease.  GLUCOSE, CAPILLARY     Status: None   Collection Time    01/08/14  7:30 AM      Result Value Range   Glucose-Capillary 93  70 - 99 mg/dL  GLUCOSE, CAPILLARY     Status: None   Collection Time    01/08/14 11:10 AM      Result Value Range   Glucose-Capillary 94  70 - 99 mg/dL  GLUCOSE, CAPILLARY     Status: None   Collection Time    01/08/14  3:10 PM      Result Value Range   Glucose-Capillary 85  70 - 99 mg/dL  BASIC METABOLIC PANEL     Status: Abnormal   Collection Time    01/08/14  6:00 PM      Result Value Range   Sodium 148 (*) 137 - 147 mEq/L   Potassium 4.8  3.7 - 5.3 mEq/L   Chloride 108  96 - 112 mEq/L   CO2 15 (*) 19 - 32 mEq/L   Glucose, Bld 77  70 - 99 mg/dL   BUN 47 (*) 6 - 23 mg/dL   Creatinine, Ser 6.92 (*) 0.50 - 1.35 mg/dL   Calcium 8.2 (*) 8.4 - 10.5 mg/dL   GFR calc non Af Amer 8 (*) >90 mL/min   GFR calc Af  Amer 10 (*) >90 mL/min   Comment: (NOTE)     The eGFR has been calculated using the CKD EPI equation.     This calculation has not been validated in all clinical situations.     eGFR's persistently <90 mL/min signify possible Chronic Kidney     Disease.  GLUCOSE, CAPILLARY     Status: None   Collection Time    01/08/14  7:24 PM      Result Value  Range   Glucose-Capillary 70  70 - 99 mg/dL   Labs are reviewed and are pertinent for medical issues being treatment.  Current Facility-Administered Medications  Medication Dose Route Frequency Provider Last Rate Last Dose  . 0.9 %  sodium chloride infusion  250 mL Intravenous PRN Elsie Stain, MD      . albuterol (PROVENTIL) (2.5 MG/3ML) 0.083% nebulizer solution 2.5 mg  2.5 mg Nebulization Q2H PRN Carin Hock, MD      . dexmedetomidine (PRECEDEX) 400 MCG/100ML infusion  0.4-1.2 mcg/kg/hr Intravenous Titrated Carin Hock, MD 19.1 mL/hr at 01/08/14 1900 0.8 mcg/kg/hr at 01/08/14 1900  . dextrose 5 % solution   Intravenous Continuous Leone Haven, MD 75 mL/hr at 01/08/14 0100    . feeding supplement (VITAL AF 1.2 CAL) liquid 1,000 mL  1,000 mL Per Tube Continuous Dalene Carrow, RD   1,000 mL at 01/07/14 0548  . fentaNYL (SUBLIMAZE) injection 100 mcg  100 mcg Intravenous Q2H PRN Elsie Stain, MD   100 mcg at 01/07/14 2247  . folic acid injection 1 mg  1 mg Intravenous Daily Raylene Miyamoto, MD   1 mg at 01/08/14 1130  . heparin injection 5,000 Units  5,000 Units Subcutaneous Q8H Carin Hock, MD   5,000 Units at 01/08/14 1425  . hydrALAZINE (APRESOLINE) injection 20 mg  20 mg Intravenous TID Raylene Miyamoto, MD   20 mg at 01/08/14 1115  . insulin aspart (novoLOG) injection 1-3 Units  1-3 Units Subcutaneous Q4H Elsie Stain, MD   1 Units at 01/05/14 [phone]  . levETIRAcetam (KEPPRA) 500 mg in sodium chloride 0.9 % 100 mL IVPB  500 mg Intravenous Q12H Raylene Miyamoto, MD   500 mg at 01/08/14  1447  . midazolam (VERSED) injection 1-2 mg  1-2 mg Intravenous Q1H PRN Raylene Miyamoto, MD   2 mg at 01/07/14 1653  . pantoprazole (PROTONIX) injection 40 mg  40 mg Intravenous QHS Raylene Miyamoto, MD      . sodium chloride 0.9 % bolus 125 mL  125 mL Intravenous Once Deno Etienne, MD        Psychiatric Specialty Exam:     Blood pressure 143/95, pulse 114, temperature 98.7 F (37.1 C), temperature source Oral, resp. rate 26, height _0  (1.88 m), weight 200 lb 9.9 oz (91 kg), SpO2 97.00%.Body mass index is 25.75 kg/(m^2).  General Appearance: Disheveled  Eye Sport and exercise psychologist::  None  Speech:  Negative  Volume:  Decreased  Mood:  Anxious  Affect:  Congruent  Thought Process:  unable to assess  Orientation:  Other:  unable to assess  Thought Content:  unable to assess  Suicidal Thoughts:  Unable to assess  Homicidal Thoughts:  Unable to assess  Memory: unable to assess  Judgement:  Impaired  Insight:  unable to assess  Psychomotor Activity:  Decreased  Concentration:  Poor  Recall:  unable to assess  Fund of Knowledge:unable to assess  Language: unable to assess  Akathisia:  No  Handed:  Right  AIMS (if indicated):     Assets:  Resilience Social Support  Sleep:      Musculoskeletal: Strength & Muscle Tone: unable to assess Gait & Station: unable to assess Patient leans: unable to assess  Treatment Plan Summary: Daily contact with patient to assess and evaluate symptoms and progress in treatment Medication management--see recommendations above.  Waylan Boga, [place] 01/08/2014 9:23 PM

## 2014-01-08 NOTE — Progress Notes (Signed)
[facility]M   Name: Brady Hoffman MRN: [id number] DOB: May 18, 1966    ADMISSION DATE:  01/02/2014 CONSULTATION DATE: 01/02/14   PRIMARY SERVICE: PCCM   CHIEF COMPLAINT: Altered mental status, Seizure, Acute renal failure   BRIEF PATIENT DESCRIPTION:  48 years old male with Schizoaffective disorder, resident of [facility] nursing home. Was in his usual state of health and sustained a fall with ?head trauma. He then had a generalized seizure. At arrival to the ED on acute renal failure, intubated for airway protection.   SIGNIFICANT EVENTS / STUDIES:  2/1 CT abd/pelvis - unremarkable; LLL consolidation  2/1 CT Head - no acute intracranial abnormalities  2/1 CT C-spine - no acute cervical spine abnormalities  2/1>>> started on CRRT (through 2/3) 2/2 Renal u /s - No hydro  2/2 - Fomepizole started, EG back neg, dce'd  2/3 - EEG - No evidence of epileptiform discharges, No electrographic seizures noted. This is a normal asleep EEG. No electrographic seizures noted.  2/4- more agitated  2/5 - SCr worse  2/5 - too aggitated again for extubation 2/6 MRI >>> neg acute, premature atrophy   LINES / TUBES:  [facility] 2/1>>>  R Femoral HD cath 2/1>>>  ETT 2/1>>> 2/6 OGT 2/1>>> 2/6 Foley 2/1>>> 2/5  CULTURES:  MRSA negative  Blood 2/1 >>> neg to date Urine 2/1>>> neg Urine 2/2>>> neg  ANTIBIOTICS:  Zosyn 2/2>>> 2/3   SUBJECTIVE:  Extubated 2/6.  Remains encephalopathic, on precedex.   VITAL SIGNS: Temp:  [97.9 F (36.6 C)-98.8 F (37.1 C)] 98.4 F (36.9 C) (02/07 0748) Pulse Rate:  [59-109] 103 (02/07 0700) Resp:  [6-22] 22 (02/07 0700) BP: (106-165)/(67-112) 152/96 mmHg (02/07 0600) SpO2:  [93 %-100 %] 93 % (02/07 0700) FiO2 (%):  [30 %-100 %] 30 % (02/06 1200) Weight:  [200 lb 9.9 oz (91 kg)] 200 lb 9.9 oz (91 kg) (02/07 0439) HEMODYNAMICS:   VENTILATOR SETTINGS: Vent Mode:  [-] PRVC FiO2 (%):  [30 %-100 %] 30 % Set Rate:  [12 bmp] 12 bmp Vt Set:  [650 mL] 650 mL PEEP:   [5 cmH20] 5 cmH20 Plateau Pressure:  [18 cmH20] 18 cmH20 INTAKE / OUTPUT: Intake/Output     02/06 0701 - 02/07 0700 02/07 0701 - 02/08 0700   I.V. (mL/kg) 2133.9 (23.4)    Other     NG/GT 30    IV Piggyback 210    Total Intake(mL/kg) 2373.9 (26.1)    Urine (mL/kg/hr) 3360 (1.5)    Stool 300 (0.1)    Total Output 3660     Net -1286.1          Stool Occurrence 6 x      PHYSICAL EXAMINATION: General: wdwn male, NAD  Eyes: Anicteric sclerae. PERRL  ENT: mm moist, no JVD, mild scleredema  Lymph: No cervical, supraclavicular, or axillary lymphadenopathy.  Heart: Normal S1, S2. No murmurs, rubs, or gallops appreciated.   Lungs: resps even non labored on [place], good air movement bilaterally, without wheezes or crackles.  Abdomen: Abdomen soft, non-tender and not distended, normoactive bowel sounds. No hepatosplenomegaly or masses.  Musculoskeletal: warm and dry, no LE edema  Neuro: sedated on precedex, throwing legs in the air, does not follow commands, pupils 86mm sluggish   LABS:  CBC  Recent Labs Lab 01/06/14 0500 01/07/14 0410 01/08/14 0422  WBC 6.9 5.8 10.0  HGB 9.7* 8.9* 8.9*  HCT 27.8* 25.5* 26.5*  PLT 131* 134* 141*   Coag's  Recent Labs Lab 01/02/14 1741  01/03/14 1600 01/04/14 0342 01/05/14 0410  APTT 28  < > 32 38* 36  INR 1.32  --  1.15  --   --   < > = values in this interval not displayed. BMET  Recent Labs Lab 01/07/14 0410 01/07/14 1700 01/08/14 0422  NA 150* 150* 149*  K 4.5 5.0 5.0  CL 113* 110 110  CO2 19 20 18*  BUN 39* 41* 44*  CREATININE 7.45* 7.21* 7.15*  GLUCOSE 109* 104* 88   Electrolytes  Recent Labs Lab 01/04/14 1117 01/04/14 2214 01/05/14 0130 01/05/14 0410  01/07/14 0410 01/07/14 1700 01/08/14 0422  CALCIUM  --   --   --  7.5*  < > 7.9* 8.3* 8.2*  MG 2.4 2.4 2.4 2.4  --   --   --   --   PHOS 3.9 5.7* 6.2*  --   --   --   --   --   < > = values in this interval not displayed. Sepsis Markers  Recent Labs Lab  01/02/14 1755 01/03/14 1127 01/04/14 0342 01/05/14 0410 01/05/14 0933  LATICACIDVEN 10.98*  --   --   --  0.6  PROCALCITON  --  0.45 0.31 0.33  --    ABG  Recent Labs Lab 01/02/14 1809 01/03/14 0340 01/04/14 0243  PHART 7.179* 7.443 7.490*  PCO2ART 34.3* 25.7* 28.2*  PO2ART 476.0* 204.0* 143.0*   Liver Enzymes  Recent Labs Lab 01/02/14 1741 01/03/14 0423 01/03/14 1600 01/04/14 0340  AST 17  --   --   --   ALT 45  --   --   --   ALKPHOS 89  --   --   --   BILITOT 0.3  --   --   --   ALBUMIN 3.5 3.0* 2.8* 2.6*   Cardiac Enzymes  Recent Labs Lab 01/02/14 2205 01/03/14 0125 01/03/14 0754  TROPONINI <0.30 <0.30 <0.30   Glucose  Recent Labs Lab 01/07/14 1140 01/07/14 1538 01/07/14 1858 01/07/14 2354 01/08/14 0356 01/08/14 0730  GLUCAP 79 98 105* 98 89 93    Imaging Mr Brain Wo Contrast  01/07/2014   CLINICAL DATA:  [place] with head trauma.  Mental status changes.  EXAM: MRI HEAD WITHOUT CONTRAST  TECHNIQUE: Multiplanar, multiecho pulse sequences of the brain and surrounding structures were obtained without intravenous contrast.  COMPARISON:  Head CT 01/02/2014  FINDINGS: Diffusion imaging does not show any acute or subacute infarction. The brainstem and cerebellum are normal. The cerebral hemispheres show mild generalized atrophy but there is no evidence of focal infarction, mass lesion, hemorrhage, hydrocephalus or extra-axial collection. No pituitary mass. No fluid in the sinuses, or middle ears. There is some fluid in the mastoid air cells on the left.  IMPRESSION: No acute or focal intracranial finding. Brain atrophy premature for age.   Electronically Signed   By: Nelson Chimes M.D.   On: 01/07/2014 11:25   Dg Chest Port 1 View  01/07/2014   CLINICAL DATA:  Intubated.  EXAM: PORTABLE CHEST - 1 VIEW  COMPARISON:  01/06/2014  FINDINGS: Endotracheal tube ends in the mid thoracic trachea. There is a left IJ catheter, tip near the SVC origin. Orogastric tube crosses  the diaphragm.  No cardiomegaly given technique. Lung aeration improved from prior. No effusion or pneumothorax.  IMPRESSION: 1. Tubes and lines remain in good position. 2. Improving lung aeration.   Electronically Signed   By: Jorje Guild M.D.   On: 01/07/2014 06:07  ASSESSMENT / PLAN:   PULMONARY  A:  Aspiration pneumonia  Acute resp failure  P:  Pulmonary hygiene  F/u CXR    CARDIOVASCULAR  A:  Bradycardia (on propofol) - resolved  Hypertensive with no known history  Hyperlipidemia   P:  Hold statin for now  Continue hydralazine   RENAL  A:  ARF of unclear etiology (no known history) - SCr 20.63>>13.26>>5.23>>6.17>>7.23>>7.45.  Family/group home owner at bedside say the weeks leading up to this event he has been more unstable from psych standpoint, locking himself in bathroom, poor po intake, ?ethylene glycol ingestion (was neg on admit)  AG metabolic acidosis  Hypokalemia --resolved  Methanol Neg  Ethylene glycol Neg, clinical circumstances matches  Hypernatremia 2/2 solute diuresis  Recent Labs Lab 01/05/14 0410 01/06/14 0500 01/07/14 0410 01/07/14 1700 01/08/14 0422  CREATININE 6.17* 7.23* 7.45* 7.21* 7.15*  P:  Nephrology following - no further HD planned for now  Continue D5W F/u chem  Consider lasix with hypernatremia, htn (held 2/6 r/t low CVP)   GASTROINTESTINAL  A:  SUP  Last BM 2/5 P:  Continue Protonix  NPO until improved mental status   HEMATOLOGIC  A:  Anemia - downtrending (8.9) DVT ppx  P:  Check FOBT Follow CBC  SQ heparin   INFECTIOUS  A:  Afebrile, no leukocytosis  P:  All cultures negative to date  Trend fever curve   ENDOCRINE  A:  Hyperglycemia - controlled  P:  Continue Novolog SSI per ICU protocol  Follow CBG   NEUROLOGIC  A:  Schizoaffective disorder  Seizure s/p fall with head trauma  AMS (post ictal vs uremia vs drug OD / toxin  vs home regimen SE)  R/o EG tox, suicide OD?  EEG and MRI -  neg Ethylene glycol <5 (NEG)  UDS neg  P:  Neurology following  F/u EEG 2/9 Continue Keppra  Wean precedex  Will consult psych for assist   Home psych meds on hold for now: clozapine, fluphenazine, venlafaxine, benztropine  Would consider dantrolene challenge if rigidity noted again or high fevers  Family updated at bedside 2/7  I have personally obtained a history, examined the patient, evaluated laboratory and imaging results, formulated the assessment and plan and placed orders.   Darlina Sicilian, NP 01/08/2014  10:52 AM Pager: [phone]) [phone] or [phone]  *Care during the described time interval was provided by me and/or other providers on the critical care team. I have reviewed this patient's available data, including medical history, events of note, physical examination and test results as part of my evaluation.  Baltazar Apo, MD, PhD 01/08/2014, 1:54 PM [facility] [phone] or if no answer [phone]

## 2014-01-08 NOTE — Progress Notes (Signed)
Subjective: Patient was successfully extubated on 01/07/2014. He has remained obtunded and agitated, requiring sedating medications and restraints. MRI of his brain on 01/07/2014 showed no acute intracranial abnormality.  Objective: Current vital signs: BP 152/96  Pulse 103  Temp(Src) 98.4 F (36.9 C) (Oral)  Resp 22  Ht 6\' 2"  (1.88 m)  Wt 91 kg (200 lb 9.9 oz)  BMI 25.75 kg/m2  SpO2 93%  Neurologic Exam: Normal regular respirations. Patient was sedated with Precedex. Patient was still moderately agitated, however. Extraocular movements were intact and normal with right and left lateral gaze. Face was symmetrical with no signs of weakness. Strength and muscle tone normal throughout. Deep tendon reflexes were normal and symmetrical. Responses were mute bilaterally.  Medications: I have reviewed the patient's current medications.  Assessment/Plan: Encephalopathic state of unclear etiology but most likely partially related to kidney failure. It's unclear whether patient may have also had a period of hypoxic encephalopathy prior to admission. However, EEG on 01/04/2014 was normal with no indications of encephalopathy, nor any signs of seizure activity. He has currently not on any of his routine psychotropic medications.  Recommend considering psychiatry consultation for recommendations regarding reinstituting psychotropic meds. No changes in management recommended otherwise. We'll consider repeat EEG next week.   C.R. Nicole Kindred, MD [facility] [phone]  01/08/2014  9:14 AM

## 2014-01-08 NOTE — Progress Notes (Addendum)
Admit: 01/02/2014 LOS: 6  89M admit after found unconscious with profound renal failure (SCr 1.1 10/2013), increased anion gap metabolic acidosis, seizure, uremia, hyperkalemia.  AKI w/u: bland UA; C3/C4 WNL; HBV, HCV, HIV neg; renal US unremarkable, CK 462.  No contrast exposure.  Was not on Lithium.  Treated for toxic alcohol ingestion with CRRT and fomepizole but methanol and ethylene glycol levels negative (post CRRT), On CRRT 2/1-2/3.   Now showing renal recovery from unclear insult.   Subjective:  MRI WNL Extubated successfully, now on 2L [place] Renal function stable, excellent UOP HCO3 stable  02/06 0701 - 02/07 0700 In: 2373.9 [I.V.:2133.9; NG/GT:30; IV Piggyback:210] Out: 4098 [Urine:3360; Stool:300]  Filed Weights   01/06/14 0243 01/07/14 0205 01/08/14 0439  Weight: 95.3 kg (210 lb 1.6 oz) 95.7 kg (210 lb 15.7 oz) 91 kg (200 lb 9.9 oz)    Current meds: reviewed  Current Labs: reviewed, C3 and C4 WNL, Serologies negative.    Renal US 01/03/14:  Normal sized, inc echogenicity, no obstruction   Physical Exam:  Blood pressure 152/96, pulse 103, temperature 98.4 F (36.9 C), temperature source Oral, resp. rate 22, height '6\' 2"'  (1.88 m), weight 91 kg (200 lb 9.9 oz), SpO2 93.00%. Extubated, NAD regular, nl s1s2 Coarse bs b/l S/nt/nd. No LEE Foley in place No rashes/lesions  Assessment/Plan 1. Dialysis Dependent AKI: Unclear cause.  UA bland.  Renal US unremarkable.  Appears to be recovering.  Exactly what happened remains unclear here.  OK with removal of HD catheter.  2. Inc AG Met Acidosis: EG level (delayed) negative.  Methanol negative.  Had some lactate upon presentation but not sufficient to explain gap.  Was treated with CRRT/Fomepizole for concern of toxic alcohol.  Unusual acidoses such as D-Lactate or oxoproline acidosis theoretically possible, but seem very unlikely.  Stable with mild AG acidosis currently.   3. Mild hypernatremia:likely a solute diuresis? Once able to  swallow can take ad lib free water.   4.  Pearson Grippe MD 01/08/2014, 7:58 AM   Recent Labs Lab 01/04/14 1117 01/04/14 2214 01/05/14 0130  01/07/14 0410 01/07/14 1700 01/08/14 0422  NA  --   --   --   < > 150* 150* 149*  K  --   --   --   < > 4.5 5.0 5.0  CL  --   --   --   < > 113* 110 110  CO2  --   --   --   < > 19 20 18*  GLUCOSE  --   --   --   < > 109* 104* 88  BUN  --   --   --   < > 39* 41* 44*  CREATININE  --   --   --   < > 7.45* 7.21* 7.15*  CALCIUM  --   --   --   < > 7.9* 8.3* 8.2*  PHOS 3.9 5.7* 6.2*  --   --   --   --   < > = values in this interval not displayed.  Recent Labs Lab 01/06/14 0500 01/07/14 0410 01/08/14 0422  WBC 6.9 5.8 10.0  HGB 9.7* 8.9* 8.9*  HCT 27.8* 25.5* 26.5*  MCV 88.3 90.1 89.5  PLT 131* 134* 141*

## 2014-01-09 ENCOUNTER — Inpatient Hospital Stay (HOSPITAL_COMMUNITY): Payer: Medicare Other

## 2014-01-09 DIAGNOSIS — F259 Schizoaffective disorder, unspecified: Secondary | ICD-10-CM

## 2014-01-09 LAB — CULTURE, BLOOD (ROUTINE X 2)
CULTURE: NO GROWTH
Culture: NO GROWTH

## 2014-01-09 LAB — CBC
HEMATOCRIT: 27.9 % — AB (ref 39.0–52.0)
Hemoglobin: 9.4 g/dL — ABNORMAL LOW (ref 13.0–17.0)
MCH: 30.2 pg (ref 26.0–34.0)
MCHC: 33.7 g/dL (ref 30.0–36.0)
MCV: 89.7 fL (ref 78.0–100.0)
Platelets: 165 10*3/uL (ref 150–400)
RBC: 3.11 MIL/uL — ABNORMAL LOW (ref 4.22–5.81)
RDW: 13.4 % (ref 11.5–15.5)
WBC: 9.8 10*3/uL (ref 4.0–10.5)

## 2014-01-09 LAB — POCT I-STAT 3, ART BLOOD GAS (G3+)
Acid-base deficit: 7 mmol/L — ABNORMAL HIGH (ref 0.0–2.0)
Bicarbonate: 16.2 mEq/L — ABNORMAL LOW (ref 20.0–24.0)
O2 SAT: 94 %
PCO2 ART: 25.7 mmHg — AB (ref 35.0–45.0)
Patient temperature: 99.1
TCO2: 17 mmol/L (ref 0–100)
pH, Arterial: 7.408 (ref 7.350–7.450)
pO2, Arterial: 71 mmHg — ABNORMAL LOW (ref 80.0–100.0)

## 2014-01-09 LAB — BASIC METABOLIC PANEL
BUN: 51 mg/dL — ABNORMAL HIGH (ref 6–23)
CO2: 15 mEq/L — ABNORMAL LOW (ref 19–32)
Calcium: 8.5 mg/dL (ref 8.4–10.5)
Chloride: 108 mEq/L (ref 96–112)
Creatinine, Ser: 6.7 mg/dL — ABNORMAL HIGH (ref 0.50–1.35)
GFR, EST AFRICAN AMERICAN: 10 mL/min — AB (ref >90)
GFR, EST NON AFRICAN AMERICAN: 9 mL/min — AB (ref >90)
Glucose, Bld: 88 mg/dL (ref 70–99)
POTASSIUM: 4.8 meq/L (ref 3.7–5.3)
Sodium: 148 mEq/L — ABNORMAL HIGH (ref 137–147)

## 2014-01-09 LAB — CK: CK TOTAL: 660 U/L — AB (ref 7–232)

## 2014-01-09 LAB — LACTIC ACID, PLASMA: Lactic Acid, Venous: 0.6 mmol/L (ref 0.5–2.2)

## 2014-01-09 LAB — GLUCOSE, CAPILLARY
GLUCOSE-CAPILLARY: 81 mg/dL (ref 70–99)
GLUCOSE-CAPILLARY: 82 mg/dL (ref 70–99)
GLUCOSE-CAPILLARY: 96 mg/dL (ref 70–99)
Glucose-Capillary: 86 mg/dL (ref 70–99)
Glucose-Capillary: 89 mg/dL (ref 70–99)
Glucose-Capillary: 94 mg/dL (ref 70–99)

## 2014-01-09 LAB — KETONES, QUALITATIVE

## 2014-01-09 MED ORDER — CLOZAPINE 100 MG PO TABS
100.0000 mg | ORAL_TABLET | ORAL | Status: DC
  Administered 2014-01-09 – 2014-01-17 (×17): 100 mg via ORAL
  Filled 2014-01-09 (×18): qty 1

## 2014-01-09 MED ORDER — SODIUM BICARBONATE 8.4 % IV SOLN
INTRAVENOUS | Status: DC
  Administered 2014-01-09 – 2014-01-10 (×3): via INTRAVENOUS
  Filled 2014-01-09 (×5): qty 100

## 2014-01-09 MED ORDER — FREE WATER
200.0000 mL | Freq: Three times a day (TID) | Status: DC
  Administered 2014-01-09 – 2014-01-11 (×6): 200 mL

## 2014-01-09 MED ORDER — CLOZAPINE 100 MG PO TABS
400.0000 mg | ORAL_TABLET | Freq: Every day | ORAL | Status: DC
  Administered 2014-01-09 – 2014-01-16 (×8): 400 mg via ORAL
  Filled 2014-01-09 (×10): qty 4

## 2014-01-09 MED ORDER — BENZTROPINE MESYLATE 2 MG PO TABS
2.0000 mg | ORAL_TABLET | Freq: Every day | ORAL | Status: DC
  Administered 2014-01-09 – 2014-01-16 (×8): 2 mg via ORAL
  Filled 2014-01-09 (×10): qty 1

## 2014-01-09 NOTE — Progress Notes (Signed)
Admit: 01/02/2014 LOS: 7  10M admit after found unconscious with profound renal failure (SCr 20, was SCr 1.1 10/2013), increased anion gap metabolic acidosis, seizure, uremia, hyperkalemia.  AKI w/u: bland UA; C3/C4 WNL; HBV, HCV, HIV neg; renal US unremarkable, CK 462.  No contrast exposure.  Was not on Lithium.  Treated for toxic alcohol ingestion with CRRT and fomepizole but methanol and ethylene glycol levels negative (post CRRT), On CRRT 2/1-2/3.   Now showing renal recovery from unclear insult.   Subjective:  AMS persists Good UOP SCr improved  02/07 0701 - 02/08 0700 In: 2408.4 [I.V.:2203.4; IV Piggyback:205] Out: 4300 [Urine:4300]  Filed Weights   01/07/14 0205 01/08/14 0439 01/09/14 0435  Weight: 95.7 kg (210 lb 15.7 oz) 91 kg (200 lb 9.9 oz) 89.9 kg (198 lb 3.1 oz)    Current meds: reviewed  Current Labs: reviewed, C3 and C4 WNL, Serologies negative.    Renal US 01/03/14:  Normal sized, inc echogenicity, no obstruction   Physical Exam:  Blood pressure 158/110, pulse 118, temperature 98.4 F (36.9 C), temperature source Oral, resp. rate 25, height '6\' 2"'  (1.88 m), weight 89.9 kg (198 lb 3.1 oz), SpO2 95.00%. Extubated, NAD regular, nl s1s2 Coarse bs b/l S/nt/nd. No LEE Foley in place No rashes/lesions  Assessment/Plan 1. Dialysis Dependent AKI: Unclear cause.  UA bland.  Renal US unremarkable.  Appears to be recovering.  Exactly what happened remains unclear here.  OK with removal of HD catheter.  2. Inc AG Met Acidosis: EG level (delayed) negative.  Methanol negative.  Had some lactate upon presentation but not sufficient to explain gap.  Was treated with CRRT/Fomepizole for concern of toxic alcohol.  Unusual acidoses such as D-Lactate or oxoproline acidosis theoretically possible, but seem very unlikely.  Stable with mild AG acidosis currently.   3. Mild hypernatremia:likely a solute diuresis? Once able to swallow can take ad lib free water.  On D5W @ 21m/hr 4.  RPearson GrippeMD 01/09/2014, 8:52 AM   Recent Labs Lab 01/04/14 1117 01/04/14 2214 01/05/14 0130  01/08/14 0422 01/08/14 1800 01/09/14 0400  NA  --   --   --   < > 149* 148* 148*  K  --   --   --   < > 5.0 4.8 4.8  CL  --   --   --   < > 110 108 108  CO2  --   --   --   < > 18* 15* 15*  GLUCOSE  --   --   --   < > 88 77 88  BUN  --   --   --   < > 44* 47* 51*  CREATININE  --   --   --   < > 7.15* 6.92* 6.70*  CALCIUM  --   --   --   < > 8.2* 8.2* 8.5  PHOS 3.9 5.7* 6.2*  --   --   --   --   < > = values in this interval not displayed.  Recent Labs Lab 01/07/14 0410 01/08/14 0422 01/09/14 0400  WBC 5.8 10.0 9.8  HGB 8.9* 8.9* 9.4*  HCT 25.5* 26.5* 27.9*  MCV 90.1 89.5 89.7  PLT 134* 141* 165

## 2014-01-09 NOTE — Progress Notes (Signed)
[facility]   Name: Brady Hoffman MRN: [id number] DOB: 22-Aug-1966    ADMISSION DATE:  01/02/2014 CONSULTATION DATE: 01/02/14   PRIMARY SERVICE: PCCM   CHIEF COMPLAINT: Altered mental status, Seizure, Acute renal failure   BRIEF PATIENT DESCRIPTION:  48 years old male with Schizoaffective disorder, resident of [facility] nursing home. Was in his usual state of health and sustained a fall with ?head trauma. He then had a generalized seizure. At arrival to the ED on acute renal failure, intubated for airway protection.   SIGNIFICANT EVENTS / STUDIES:  2/1 CT abd/pelvis - unremarkable; LLL consolidation  2/1 CT Head - no acute intracranial abnormalities  2/1 CT C-spine - no acute cervical spine abnormalities  2/1>>> started on CRRT (through 2/3) 2/2 Renal u /s - No hydro  2/2 - Fomepizole started, EG back neg, dce'd  2/3 - EEG - No evidence of epileptiform discharges, No electrographic seizures noted. This is a normal asleep EEG. No electrographic seizures noted.  2/4- more agitated  2/5 - SCr worse  2/5 - too aggitated again for extubation 2/6 MRI >>> neg acute, premature atrophy   LINES / TUBES:  LIJ 2/1>>>  R Femoral HD cath 2/1>>>  ETT 2/1>>> 2/6 OGT 2/1>>> 2/6 Foley 2/1>>> 2/5  CULTURES:  MRSA negative  Blood 2/1 >>> neg to date Urine 2/1>>> neg Urine 2/2>>> neg  ANTIBIOTICS:  Zosyn 2/2>>> 2/3   SUBJECTIVE:  Remains encephalopathic, on precedex but weaning.   VITAL SIGNS: Temp:  [98.4 F (36.9 C)-99.1 F (37.3 C)] 99.1 F (37.3 C) (02/08 1130) Pulse Rate:  [87-125] 118 (02/08 0600) Resp:  [15-28] 25 (02/08 0600) BP: (120-159)/(75-127) 133/75 mmHg (02/08 1042) SpO2:  [92 %-99 %] 95 % (02/08 0600) Weight:  [198 lb 3.1 oz (89.9 kg)] 198 lb 3.1 oz (89.9 kg) (02/08 0435) HEMODYNAMICS:   VENTILATOR SETTINGS:   INTAKE / OUTPUT: Intake/Output     02/07 0701 - 02/08 0700 02/08 0701 - 02/09 0700   I.V. (mL/kg) 2203.4 (24.5)    NG/GT     IV Piggyback 205     Total Intake(mL/kg) 2408.4 (26.8)    Urine (mL/kg/hr) 4300 (2)    Stool     Total Output 4300     Net -1891.6          Stool Occurrence 1 x      PHYSICAL EXAMINATION: General: wdwn male, NAD  Eyes: Anicteric sclerae. PERRL  ENT: mm moist, no JVD, mild scleredema  Heart: Normal S1, S2. No murmurs, rubs, or gallops appreciated.   Lungs: resps even non labored on [place], good air movement bilaterally, without wheezes or crackles.  Abdomen: Abdomen soft, non-tender and not distended, normoactive bowel sounds. No hepatosplenomegaly or masses.  Musculoskeletal: warm and dry, no LE edema  Neuro: sedated on precedex, throwing legs in the air, opens eyes to name, does not follow commands  LABS:  CBC  Recent Labs Lab 01/07/14 0410 01/08/14 0422 01/09/14 0400  WBC 5.8 10.0 9.8  HGB 8.9* 8.9* 9.4*  HCT 25.5* 26.5* 27.9*  PLT 134* 141* 165   Coag's  Recent Labs Lab 01/02/14 1741  01/03/14 1600 01/04/14 0342 01/05/14 0410  APTT 28  < > 32 38* 36  INR 1.32  --  1.15  --   --   < > = values in this interval not displayed. BMET  Recent Labs Lab 01/08/14 0422 01/08/14 1800 01/09/14 0400  NA 149* 148* 148*  K 5.0 4.8 4.8  CL 110 108  108  CO2 18* 15* 15*  BUN 44* 47* 51*  CREATININE 7.15* 6.92* 6.70*  GLUCOSE 88 77 88   Electrolytes  Recent Labs Lab 01/04/14 1117 01/04/14 2214 01/05/14 0130 01/05/14 0410  01/08/14 0422 01/08/14 1800 01/09/14 0400  CALCIUM  --   --   --  7.5*  < > 8.2* 8.2* 8.5  MG 2.4 2.4 2.4 2.4  --   --   --   --   PHOS 3.9 5.7* 6.2*  --   --   --   --   --   < > = values in this interval not displayed. Sepsis Markers  Recent Labs Lab 01/02/14 1755 01/03/14 1127 01/04/14 0342 01/05/14 0410 01/05/14 0933  LATICACIDVEN 10.98*  --   --   --  0.6  PROCALCITON  --  0.45 0.31 0.33  --    ABG  Recent Labs Lab 01/02/14 1809 01/03/14 0340 01/04/14 0243  PHART 7.179* 7.443 7.490*  PCO2ART 34.3* 25.7* 28.2*  PO2ART 476.0* 204.0* 143.0*    Liver Enzymes  Recent Labs Lab 01/02/14 1741 01/03/14 0423 01/03/14 1600 01/04/14 0340  AST 17  --   --   --   ALT 45  --   --   --   ALKPHOS 89  --   --   --   BILITOT 0.3  --   --   --   ALBUMIN 3.5 3.0* 2.8* 2.6*   Cardiac Enzymes  Recent Labs Lab 01/02/14 2205 01/03/14 0125 01/03/14 0754  TROPONINI <0.30 <0.30 <0.30   Glucose  Recent Labs Lab 01/08/14 1510 01/08/14 1924 01/08/14 2357 01/09/14 0351 01/09/14 0709 01/09/14 1116  GLUCAP 85 70 89 81 86 82    Imaging No results found.   ASSESSMENT / PLAN:   PULMONARY  A:  ?Aspiration pneumonia  Acute resp failure  P:  Pulmonary hygiene  F/u CXR  Monitor off abx   CARDIOVASCULAR  A:  Bradycardia (on propofol) - resolved  Hypertensive with no known history  Hyperlipidemia   P:  Hold statin for now  Continue hydralazine   RENAL  A:  ARF of unclear etiology (no known history) - SCr improving slowly. Family/group home owner at bedside say that during weeks leading up to this event he has been more unstable from psych standpoint, locking himself in bathroom, poor po intake, ?ethylene glycol ingestion (was neg on admit)  AG metabolic acidosis  Hypokalemia --resolved  Methanol Neg  Ethylene glycol Neg, clinical circumstances matches  Hypernatremia 2/2 solute diuresis  Recent Labs Lab 01/07/14 0410 01/07/14 1700 01/08/14 0422 01/08/14 1800 01/09/14 0400  CREATININE 7.45* 7.21* 7.15* 6.92* 6.70*  P:  Nephrology following - no further HD planned for now  Continue D5W F/u chem  Consider lasix with hypernatremia, htn - hold for now, not obviously volume overloaded, Na trending down slowly   GASTROINTESTINAL  A:  SUP  Last BM 2/5 P:  Continue Protonix  NPO until improved mental status - may need to attempt panda and TF with prolonged recovery of mental status and no nutrition   HEMATOLOGIC  A:  Anemia -  DVT ppx  P:  Check FOBT Follow CBC  SQ heparin   INFECTIOUS  A:   Afebrile, no leukocytosis  P:  All cultures negative to date  Trend fever curve off abx   ENDOCRINE  A:  Hyperglycemia - controlled  P:  Continue Novolog SSI per ICU protocol  Follow CBG   NEUROLOGIC  A:  Schizoaffective disorder  Seizure s/p fall with head trauma  AMS (post ictal vs uremia vs drug OD / toxin  vs home regimen SE)  R/o EG tox, suicide OD?  EEG and MRI - neg Ethylene glycol <5 (NEG)  UDS neg  P:  Neurology following  F/u EEG 2/9 Continue Keppra  Wean precedex  Would consider dantrolene challenge if rigidity noted again or high fevers (none to date) Place feeding tube to facilitate restart clozapine, cogentin.  Will assess response to these before adding effexor. Prolixin could either help or hurt with either withdrawal or extra-pyramidal sx. Will not plan to add back at this time. Psych consultants agree, appreciate their input.   Family updated at bedside 2/8   I have personally obtained a history, examined the patient, evaluated laboratory and imaging results, formulated the assessment and plan and placed orders.   Darlina Sicilian, NP 01/09/2014  12:50 PM Pager: [phone] or [phone]  *Care during the described time interval was provided by me and/or other providers on the critical care team. I have reviewed this patient's available data, including medical history, events of note, physical examination and test results as part of my evaluation.  35 minutes CC time  Baltazar Apo, MD, PhD 01/09/2014, 3:08 PM [facility] [phone] or if no answer [phone]

## 2014-01-10 ENCOUNTER — Inpatient Hospital Stay (HOSPITAL_COMMUNITY): Payer: Medicare Other

## 2014-01-10 DIAGNOSIS — E872 Acidosis, unspecified: Secondary | ICD-10-CM

## 2014-01-10 LAB — BASIC METABOLIC PANEL
BUN: 49 mg/dL — ABNORMAL HIGH (ref 6–23)
CO2: 19 mEq/L (ref 19–32)
Calcium: 8.4 mg/dL (ref 8.4–10.5)
Chloride: 111 mEq/L (ref 96–112)
Creatinine, Ser: 6 mg/dL — ABNORMAL HIGH (ref 0.50–1.35)
GFR, EST AFRICAN AMERICAN: 12 mL/min — AB (ref >90)
GFR, EST NON AFRICAN AMERICAN: 10 mL/min — AB (ref >90)
Glucose, Bld: 123 mg/dL — ABNORMAL HIGH (ref 70–99)
Potassium: 4.2 mEq/L (ref 3.7–5.3)
SODIUM: 150 meq/L — AB (ref 137–147)

## 2014-01-10 LAB — POCT I-STAT 3, ART BLOOD GAS (G3+)
Acid-Base Excess: 4 mmol/L — ABNORMAL HIGH (ref 0.0–2.0)
BICARBONATE: 28.5 meq/L — AB (ref 20.0–24.0)
O2 SAT: 100 %
PH ART: 7.418 (ref 7.350–7.450)
TCO2: 30 mmol/L (ref 0–100)
pCO2 arterial: 44.2 mmHg (ref 35.0–45.0)
pO2, Arterial: 368 mmHg — ABNORMAL HIGH (ref 80.0–100.0)

## 2014-01-10 LAB — GLUCOSE, CAPILLARY
GLUCOSE-CAPILLARY: 122 mg/dL — AB (ref 70–99)
Glucose-Capillary: 100 mg/dL — ABNORMAL HIGH (ref 70–99)
Glucose-Capillary: 109 mg/dL — ABNORMAL HIGH (ref 70–99)
Glucose-Capillary: 116 mg/dL — ABNORMAL HIGH (ref 70–99)
Glucose-Capillary: 118 mg/dL — ABNORMAL HIGH (ref 70–99)
Glucose-Capillary: 140 mg/dL — ABNORMAL HIGH (ref 70–99)

## 2014-01-10 LAB — PROTEIN ELECTROPHORESIS, SERUM
ALBUMIN ELP: 52.9 % — AB (ref 55.8–66.1)
Alpha-1-Globulin: 9.7 % — ABNORMAL HIGH (ref 2.9–4.9)
Alpha-2-Globulin: 15.6 % — ABNORMAL HIGH (ref 7.1–11.8)
BETA 2: 6.1 % (ref 3.2–6.5)
Beta Globulin: 6.8 % (ref 4.7–7.2)
Gamma Globulin: 8.9 % — ABNORMAL LOW (ref 11.1–18.8)
M-Spike, %: NOT DETECTED g/dL
Total Protein ELP: 4.9 g/dL — ABNORMAL LOW (ref 6.0–8.3)

## 2014-01-10 LAB — TROPONIN I
Troponin I: <0.3 ng/mL (ref <0.30)
Troponin I: <0.3 ng/mL (ref <0.30)

## 2014-01-10 LAB — MAGNESIUM: Magnesium: 2.1 mg/dL (ref 1.5–2.5)

## 2014-01-10 MED ORDER — SODIUM CHLORIDE 0.45 % IV SOLN
INTRAVENOUS | Status: DC
  Administered 2014-01-10 – 2014-01-13 (×6): via INTRAVENOUS
  Administered 2014-01-13: 600 mL via INTRAVENOUS

## 2014-01-10 MED ORDER — DILTIAZEM LOAD VIA INFUSION
10.0000 mg | Freq: Once | INTRAVENOUS | Status: AC
  Administered 2014-01-10: 10 mg via INTRAVENOUS

## 2014-01-10 MED ORDER — DILTIAZEM HCL 25 MG/5ML IV SOLN: 10.0000 mg | Freq: Once | INTRAVENOUS | Status: DC

## 2014-01-10 MED ORDER — CHLORHEXIDINE GLUCONATE 0.12 % MT SOLN
15.0000 mL | Freq: Two times a day (BID) | OROMUCOSAL | Status: DC
  Administered 2014-01-10 – 2014-01-13 (×6): 15 mL via OROMUCOSAL
  Filled 2014-01-10 (×7): qty 15

## 2014-01-10 MED ORDER — VENLAFAXINE HCL 75 MG PO TABS: 75.0000 mg | ORAL_TABLET | Freq: Two times a day (BID) | ORAL | Status: DC

## 2014-01-10 MED ORDER — DILTIAZEM HCL 100 MG IV SOLR
5.0000 mg/h | INTRAVENOUS | Status: DC
  Administered 2014-01-10: 15 mg/h via INTRAVENOUS
  Administered 2014-01-10: 20 mg/h via INTRAVENOUS
  Filled 2014-01-10 (×2): qty 100

## 2014-01-10 MED ORDER — FENTANYL CITRATE 0.05 MG/ML IJ SOLN
INTRAMUSCULAR | Status: AC
  Administered 2014-01-10: 200 ug
  Filled 2014-01-10: qty 4

## 2014-01-10 MED ORDER — AMIODARONE HCL IN DEXTROSE 360-4.14 MG/200ML-% IV SOLN
30.0000 mg/h | INTRAVENOUS | Status: DC
  Administered 2014-01-10 – 2014-01-11 (×2): 30 mg/h via INTRAVENOUS
  Filled 2014-01-10 (×7): qty 200

## 2014-01-10 MED ORDER — METOPROLOL TARTRATE 1 MG/ML IV SOLN
INTRAVENOUS | Status: AC
  Filled 2014-01-10: qty 5

## 2014-01-10 MED ORDER — VENLAFAXINE HCL 75 MG PO TABS: 75.0000 mg | ORAL_TABLET | Freq: Three times a day (TID) | ORAL | Status: DC

## 2014-01-10 MED ORDER — DILTIAZEM LOAD VIA INFUSION
10.0000 mg | Freq: Once | INTRAVENOUS | Status: AC
  Administered 2014-01-10: 10 mg via INTRAVENOUS
  Filled 2014-01-10: qty 10

## 2014-01-10 MED ORDER — BIOTENE DRY MOUTH MT LIQD
15.0000 mL | Freq: Two times a day (BID) | OROMUCOSAL | Status: DC
  Administered 2014-01-11 – 2014-01-13 (×5): 15 mL via OROMUCOSAL

## 2014-01-10 MED ORDER — AMIODARONE LOAD VIA INFUSION
150.0000 mg | Freq: Once | INTRAVENOUS | Status: AC
  Administered 2014-01-10: 150 mg via INTRAVENOUS
  Filled 2014-01-10: qty 83.34

## 2014-01-10 MED ORDER — AMIODARONE HCL IN DEXTROSE 360-4.14 MG/200ML-% IV SOLN
60.0000 mg/h | INTRAVENOUS | Status: AC
  Administered 2014-01-10 (×2): 60 mg/h via INTRAVENOUS
  Filled 2014-01-10: qty 200

## 2014-01-10 MED ORDER — MIDAZOLAM HCL 2 MG/2ML IJ SOLN
INTRAMUSCULAR | Status: AC
  Administered 2014-01-10: 4 mg
  Filled 2014-01-10: qty 4

## 2014-01-10 MED ORDER — VENLAFAXINE HCL 75 MG PO TABS
75.0000 mg | ORAL_TABLET | Freq: Every day | ORAL | Status: DC
  Administered 2014-01-10 – 2014-01-11 (×2): 75 mg via ORAL
  Filled 2014-01-10 (×3): qty 1

## 2014-01-10 MED ORDER — METOPROLOL TARTRATE 1 MG/ML IV SOLN
5.0000 mg | INTRAVENOUS | Status: DC | PRN
  Administered 2014-01-10: 5 mg via INTRAVENOUS

## 2014-01-10 NOTE — Progress Notes (Signed)
Chaplain offered emotional and spiritual support to patient's mother and friend. Neither had any needs at this time. They were appreciative of chaplain support. Will follow up as needed.   Ethelene Browns [phone]

## 2014-01-10 NOTE — Progress Notes (Signed)
eLink Physician-Brief Progress Note Patient Name: Brady Hoffman DOB: 12-25-1965 MRN: [id number]  Date of Service  01/10/2014   HPI/Events of Note  Persistent AFib/Flutter with RVR with HRs to the 160s despite 2 bolus dooses of diltizem and gtt with ax level at 20 mg.  BP soft but thas been stable with MAP greater than 65.   eICU Interventions  Plan: Start Amio gtt with bolus. Continue to monitor HR and BP closely.   Intervention Category Intermediate Interventions: Arrhythmia - evaluation and management  DETERDING,ELIZABETH 01/10/2014, 6:44 AM

## 2014-01-10 NOTE — Progress Notes (Signed)
Admit: 01/02/2014 LOS: 8  9M admit after found unconscious with profound renal failure (SCr 20, was SCr 1.1 10/2013), increased anion gap metabolic acidosis, seizure, uremia, hyperkalemia.  AKI w/u: bland UA; C3/C4 WNL; HBV, HCV, HIV neg; renal US unremarkable, CK 462.  No contrast exposure.  Was not on Lithium.  Treated for toxic alcohol ingestion with CRRT and fomepizole but methanol and ethylene glycol levels negative (post CRRT), On CRRT 2/1-2/3.   Now showing renal recovery from unclear insult.   Subjective:  AMS persists- went into Afib overnight, on cardizem Good UOP SCr improved  02/08 0701 - 02/09 0700 In: 3061.1 [I.V.:2137.8; NG/GT:713.3; IV Piggyback:210] Out: 4835 [Urine:4835]  Filed Weights   01/08/14 0439 01/09/14 0435 01/10/14 0500  Weight: 91 kg (200 lb 9.9 oz) 89.9 kg (198 lb 3.1 oz) 85.7 kg (188 lb 15 oz)    Current meds: reviewed  Current Labs: reviewed, C3 and C4 WNL, Serologies negative.    Renal US 01/03/14:  Normal sized, inc echogenicity, no obstruction   Physical Exam:  Blood pressure 100/55, pulse 160, temperature 98 F (36.7 C), temperature source Oral, resp. rate 12, height '6\' 2"'  (1.88 m), weight 85.7 kg (188 lb 15 oz), SpO2 99.00%. Extubated, did not really wake up for me regular, nl s1s2 Coarse bs b/l S/nt/nd. No LEE Foley in place No rashes/lesions  Assessment/Plan 1. Dialysis Dependent AKI: Unclear cause, is no longer HD dependent since 2/3.  UA bland.  Renal US unremarkable.  Appears to be recovering.  Exactly what happened remains unclear here.  Will remove dialysis catheter 2. Inc AG Met Acidosis: EG level (delayed) negative.  Methanol negative.  Had some lactate upon presentation but not sufficient to explain gap.  Was treated with CRRT/Fomepizole for concern of toxic alcohol.  Unusual acidoses such as D-Lactate or oxoproline acidosis theoretically possible, but seem very unlikely.  Stable with mild AG acidosis currently.  Is actually improved on  bicarb drip and with resolving renal failure 3. Mild hypernatremia:likely a solute diuresis? Once able to swallow can take ad lib free water.  Now on free water thru NGT - started last night 4. Decreased MS- hypernatremia vs psych- psych meds restarted 5. Anemia- stable   Haadi Santellan A   01/10/2014, 7:28 AM   Recent Labs Lab 01/04/14 1117 01/04/14 2214 01/05/14 0130  01/08/14 1800 01/09/14 0400 01/10/14 0328  NA  --   --   --   < > 148* 148* 150*  K  --   --   --   < > 4.8 4.8 4.2  CL  --   --   --   < > 108 108 111  CO2  --   --   --   < > 15* 15* 19  GLUCOSE  --   --   --   < > 77 88 123*  BUN  --   --   --   < > 47* 51* 49*  CREATININE  --   --   --   < > 6.92* 6.70* 6.00*  CALCIUM  --   --   --   < > 8.2* 8.5 8.4  PHOS 3.9 5.7* 6.2*  --   --   --   --   < > = values in this interval not displayed.  Recent Labs Lab 01/07/14 0410 01/08/14 0422 01/09/14 0400  WBC 5.8 10.0 9.8  HGB 8.9* 8.9* 9.4*  HCT 25.5* 26.5* 27.9*  MCV 90.1 89.5 89.7  PLT 134* 141* 165

## 2014-01-10 NOTE — Procedures (Addendum)
ELECTROENCEPHALOGRAM REPORT  Patient: Brady Hoffman       Room #: 7P82 EEG No. ID: [id number] Age: 48 y.o.        Sex: male Referring Physician: Titus Mould Report Date:  01/10/2014        Interpreting Physician: Anthony Sar  History: Brady Hoffman is an 48 y.o. male who was admitted on 01/02/2014 with encephalopathic state with metabolic acidosis and status epilepticus, as well as rhabdomyolysis and acute renal failure. Encephalopathic state has persisted despite improvement in renal function. Previous EEG on 01/04/2014 showed pattern of normal sleep.    Indications for study:    Technique: This is an 18 channel routine scalp EEG performed at the bedside with bipolar and monopolar montages arranged in accordance to the international 10/20 system of electrode placement.   Description: CT recording was performed at the patient's bedside in the intensive care unit. Patient was on sedation with IV Precedex. The background activity consisted of low to moderate amplitude diffuse symmetrical irregular delta and theta activity as well as frequent occurrences of sleep spindles, arousal responses and vertex waves, which were symmetrical. Photic stimulation was not performed. Hyperventilation was not performed. No epileptiform discharges recorded. There were no areas of abnormal slowing of cerebral activity.  Interpretation: This EEG recording shows a pattern consistent with normal sleep, as seen with previous EEG. No evidence of a significant encephalopathic process was seen, nor evidence of an epileptic disorder.   [name] M.D. [facility] [phone]

## 2014-01-10 NOTE — Progress Notes (Signed)
EEG Completed; Results Pending  

## 2014-01-10 NOTE — Procedures (Signed)
Intubation Procedure Note Brady Hoffman [id number] [birth year]  Procedure: Intubation Indications: Airway protection and maintenance  Procedure Details Consent: Risks of procedure as well as the alternatives and risks of each were explained to the (patient/caregiver).  Consent for procedure obtained. Time Out: Verified patient identification, verified procedure, site/side was marked, verified correct patient position, special equipment/implants available, medications/allergies/relevent history reviewed, required imaging and test results available.  Performed  MAC   Evaluation Hemodynamic Status: BP stable throughout; O2 sats: stable throughout Patient's Current Condition: stable Complications: No apparent complications Patient did tolerate procedure well. Chest X-ray ordered to verify placement.  CXR: pending.   Brady Hoffman, Brady Hoffman 01/10/2014  I was present and supervised the entire procedure.  [name], M.D. [facility]. Pager: [phone]. After hours pager: [phone].

## 2014-01-10 NOTE — Progress Notes (Signed)
  Amiodarone Drug - Drug Interaction Consult Note  Recommendations: Monitor QTc closely with pt on concurrent therapy with Clozapine and Amiodarone   Amiodarone is metabolized by the cytochrome P450 system and therefore has the potential to cause many drug interactions. Amiodarone has an average plasma half-life of 50 days (range 20 to 100 days).   There is potential for drug interactions to occur several weeks or months after stopping treatment and the onset of drug interactions may be slow after initiating amiodarone.   []  Statins: Increased risk of myopathy. Simvastatin- restrict dose to 20mg  daily. Other statins: counsel patients to report any muscle pain or weakness immediately.  []  Anticoagulants: Amiodarone can increase anticoagulant effect. Consider warfarin dose reduction. Patients should be monitored closely and the dose of anticoagulant altered accordingly, remembering that amiodarone levels take several weeks to stabilize.  []  Antiepileptics: Amiodarone can increase plasma concentration of phenytoin, the dose should be reduced. Note that small changes in phenytoin dose can result in large changes in levels. Monitor patient and counsel on signs of toxicity.  []  Beta blockers: increased risk of bradycardia, AV block and myocardial depression. Sotalol - avoid concomitant use.  []   Calcium channel blockers (diltiazem and verapamil): increased risk of bradycardia, AV block and myocardial depression.  []   Cyclosporine: Amiodarone increases levels of cyclosporine. Reduced dose of cyclosporine is recommended.  []  Digoxin dose should be halved when amiodarone is started.  []  Diuretics: increased risk of cardiotoxicity if hypokalemia occurs.  []  Oral hypoglycemic agents (glyburide, glipizide, glimepiride): increased risk of hypoglycemia. Patient's glucose levels should be monitored closely when initiating amiodarone therapy.   [x]  Drugs that prolong the QT interval:  Torsades de pointes  risk may be increased with concurrent use - avoid if possible.  Monitor QTc, also keep magnesium/potassium WNL if concurrent therapy can't be avoided. Marland Kitchen Antibiotics: e.g. fluoroquinolones, erythromycin. . Antiarrhythmics: e.g. quinidine, procainamide, disopyramide, sotalol. . Antipsychotics: pt on clozapine   . Lithium, tricyclic antidepressants, and methadone.   Thank You,  Narda Bonds  01/10/2014 6:49 AM

## 2014-01-10 NOTE — Progress Notes (Signed)
Subjective: Patient remained obtunded and agitated and has continued to require IV sedation. No recurrent seizure activity reported.  Objective: Current vital signs: BP 100/55  Pulse 160  Temp(Src) 98.2 F (36.8 C) (Oral)  Resp 12  Ht 6\' 2"  (1.88 m)  Wt 85.7 kg (188 lb 15 oz)  BMI 24.25 kg/m2  SpO2 99%  Neurologic Exam: Patient was slightly responsive to noxious external stimuli. He is currently on Precedex 1.2 mcg per kilogram per hour. Pupils were equal and reacted normally to light. Extraocular movements were intact with oculocephalic maneuvers. Face was symmetrical with no signs of weakness. Patient moved extremities equally with normal strength bilaterally. Muscle tone was flaccid throughout. Deep tendon reflexes were 2+ and symmetrical. Plantars Potts scissors were mute bilaterally.  Medications: I have reviewed the patient's current medications.  Assessment/Plan: Encephalopathic state with no signs of significant improvement. Patient has remained time did and agitated. He still has significant renal failure. Creatinine today was 6.0. Patient's closed head injury may be a contributing factor with diffuse posttraumatic brain injury and concussion. Repeat MRI study of his brain was normal, however. He is scheduled for repeat EEG study today.  Recommend no changes in current management. We will continue to follow this patient with you.  C.R. Nicole Kindred, MD [facility]  01/10/2014  8:55 AM

## 2014-01-10 NOTE — Progress Notes (Signed)
Name: Brady Hoffman MRN: [id number] DOB: 1966-09-29    ADMISSION DATE:  01/02/2014 CONSULTATION DATE: 01/02/14   PRIMARY SERVICE: PCCM   CHIEF COMPLAINT: Altered mental status, Seizure, Acute renal failure   BRIEF PATIENT DESCRIPTION:  48 years old male with Schizoaffective disorder, resident of [facility] home. Was in his usual state of health and sustained a fall with ?head trauma. He then had a generalized seizure. At arrival to the ED on acute renal failure, intubated for airway protection.   SIGNIFICANT EVENTS / STUDIES:  2/1 CT abd/pelvis - unremarkable; LLL consolidation  2/1 CT Head - no acute intracranial abnormalities  2/1 CT C-spine - no acute cervical spine abnormalities  2/1>>> started on CRRT (through 2/3)  2/2 Renal u /s - No hydro  2/2 - Fomepizole started, EG back neg, dce'd  2/3 - EEG - No evidence of epileptiform discharges, No electrographic seizures noted. This is a normal asleep EEG. No electrographic seizures noted.  2/4- more agitated  2/5 - SCr worse  2/5 - too aggitated again for extubation  2/6 MRI >>> neg acute, premature atrophy  2/6 Extubated 2/8 Abdominal xray - NGT in proper place  LINES / TUBES:  LIJ 2/1>>>  R Femoral HD cath 2/1>>>  ETT 2/1>>> 2/6  OGT 2/1>>> 2/6  Foley 2/1>>> 2/5, 2/6>>> NGT 2/8>>>   CULTURES:  MRSA negative  Blood 2/1 >>> neg   Urine 2/1>>> neg  Urine 2/2>>> neg   ANTIBIOTICS:  Zosyn 2/2>>> 2/3   SUBJECTIVE:  Went into afib/flutter with RVR (160s) overnight, cardizem started with minimal response, changed to amio drip.   VITAL SIGNS: Temp:  [98 F (36.7 C)-99.1 F (37.3 C)] 98 F (36.7 C) (02/09 0400) Pulse Rate:  [80-159] 159 (02/09 0600) Resp:  [10-34] 18 (02/09 0600) BP: (92-160)/(58-130) 99/58 mmHg (02/09 0600) SpO2:  [96 %-100 %] 99 % (02/09 0600) Weight:  [85.7 kg (188 lb 15 oz)] 85.7 kg (188 lb 15 oz) (02/09 0500)  HEMODYNAMICS:   VENTILATOR SETTINGS:   INTAKE / OUTPUT: Intake/Output       02/08 0701 - 02/09 0700   I.V. (mL/kg) 1869.1 (21.8)   NG/GT 643.3   IV Piggyback 210   Total Intake(mL/kg) 2722.4 (31.8)   Urine (mL/kg/hr) 4835 (2.4)   Total Output 4835   Net -2112.6        PHYSICAL EXAMINATION: General: Asleep, mild arousable, NAD  Eyes: Anicteric sclerae, PERRL  ENT: MM dry, no JVD Heart: Normal S1, S2. No murmurs, rubs, or gallops appreciated.  Lungs: CTAB, no wheezes noted   Abdomen: Soft, NT, ND, BS present No hepatosplenomegaly Musculoskeletal: warm and dry, no LE edema  Neuro: sedated on precedex, moves extremities on command, unable to open eyes on command  LABS:  CBC  Recent Labs Lab 01/07/14 0410 01/08/14 0422 01/09/14 0400  WBC 5.8 10.0 9.8  HGB 8.9* 8.9* 9.4*  HCT 25.5* 26.5* 27.9*  PLT 134* 141* 165   Coag's  Recent Labs Lab 01/03/14 1600 01/04/14 0342 01/05/14 0410  APTT 32 38* 36  INR 1.15  --   --    BMET  Recent Labs Lab 01/08/14 1800 01/09/14 0400 01/10/14 0328  NA 148* 148* 150*  K 4.8 4.8 4.2  CL 108 108 111  CO2 15* 15* 19  BUN 47* 51* 49*  CREATININE 6.92* 6.70* 6.00*  GLUCOSE 77 88 123*   Electrolytes  Recent Labs Lab 01/04/14 1117 01/04/14 2214 01/05/14 0130 01/05/14 0410  01/08/14 1800  01/09/14 0400 01/10/14 0328 01/10/14 0329  CALCIUM  --   --   --  7.5*  < > 8.2* 8.5 8.4  --   MG 2.4 2.4 2.4 2.4  --   --   --   --  2.1  PHOS 3.9 5.7* 6.2*  --   --   --   --   --   --   < > = values in this interval not displayed. Sepsis Markers  Recent Labs Lab 01/03/14 1127 01/04/14 0342 01/05/14 0410 01/05/14 0933 01/09/14 1536  LATICACIDVEN  --   --   --  0.6 0.6  PROCALCITON 0.45 0.31 0.33  --   --    ABG  Recent Labs Lab 01/04/14 0243 01/09/14 1434  PHART 7.490* 7.408  PCO2ART 28.2* 25.7*  PO2ART 143.0* 71.0*   Liver Enzymes  Recent Labs Lab 01/03/14 1600 01/04/14 0340  ALBUMIN 2.8* 2.6*   Cardiac Enzymes  Recent Labs Lab 01/03/14 0754 01/10/14 0328  TROPONINI <0.30  <0.30   Glucose  Recent Labs Lab 01/09/14 0709 01/09/14 1116 01/09/14 1504 01/09/14 1930 01/09/14 2350 01/10/14 0347  GLUCAP 86 82 94 96 109* 122*    Imaging Dg Abd Portable 1v  01/09/2014   CLINICAL DATA:  NG tube placement.  EXAM: PORTABLE ABDOMEN - 1 VIEW  COMPARISON:  10/31/2011  FINDINGS: NG tube is seen with the tip in the body of the stomach. The side port is in the proximal stomach.  Gas throughout nondistended large and small bowel. Right groin dialysis catheter is in place with the tip in the upper pelvis.  IMPRESSION: NG tube in the body of the stomach.   Electronically Signed   By: Rolm Baptise M.D.   On: 01/09/2014 17:31   CXR: Noted  ASSESSMENT / PLAN:   PULMONARY  A:  ?Aspiration pneumonia  Acute resp failure - resolved Unable to protect airway. P:  - Intubate and mechanically ventilate. - F/U CXR and ABG. - Monitor off abx   CARDIOVASCULAR  A:  Afib/flutter with RVR - no known hx HTN (no known hx) Hyperlipidemia  P:  - D/c cardizem gtt. - Continue amiodarone. - EKG reviewed, mild QTc prolongation - Hold statin  RENAL  A:  ARF of unclear etiology (no known history) - SCr improving slowly. Family/group home owner at bedside say that during weeks leading up to this event he has been more unstable from psych standpoint, locking himself in bathroom, poor po intake, ?ethylene glycol ingestion (was neg on admit)  AG metabolic acidosis  Hypokalemia --resolved  Methanol Neg  Ethylene glycol Neg, clinical circumstances matches  Hypernatremia 2/2 solute diuresis  P:  - Nephrology following - no further HD planned for now. - F/u BMET in am. - Continue free water and bicarb gtt. - No diureses. - Match UOP with 1/2 NS + 50 on an hourly bases.  GASTROINTESTINAL  A:  SUP  Last BM 2/5  P:  - Continue Protonix. - Restart TF post intubation.  HEMATOLOGIC  A:  Anemia - stable DVT ppx  P:  - Check FOBT. - SQH.  INFECTIOUS  A:  Afebrile, no  leukocytosis  P:  - All cultures negative to date  - Trend fever curve off abx   ENDOCRINE  A:  Hyperglycemia - controlled  Acetone, blood - moderate P:  - Continue Novolog SSI per ICU protocol  - Follow CBG  - Cont bicarb gtt  NEUROLOGIC  A:  Schizoaffective disorder  Seizure  s/p fall with head trauma  AMS (post ictal vs uremia vs drug OD / toxin vs home regimen SE)  R/o EG tox, suicide OD?  EEG and MRI - neg  Ethylene glycol <5 (NEG)  UDS neg  P:  Neurology following  F/u EEG 2/9  Continue Keppra  Wean precedex  Would consider dantrolene challenge if rigidity noted again or high fevers (none to date)  Place feeding tube to facilitate restart clozapine, cogentin. Will assess response to these before adding effexor. Prolixin could either help or hurt with either withdrawal or extra-pyramidal sx. Will not plan to add back at this time. Psych consultants agree, appreciate their input. Will need to monitor QTc for clozapine/amio interaction Hold clozapine for now until afib/flutter controlled  Today's Summary:  Amiodarone gtt, hold clozapine, cardiology consult, repeat EKG  Dwyane Dee, MSIV, Medical Student 01/10/2014. 7:17 AM  Will intubate, mechanically ventilate, will need assistance from psych once ready to extubate.  Treatment for renal function above.  I have personally obtained a history, examined the patient, evaluated laboratory and imaging results, formulated the assessment and plan and placed orders.  CRITICAL CARE: The patient is critically ill with multiple organ systems failure and requires high complexity decision making for assessment and support, frequent evaluation and titration of therapies, application of advanced monitoring technologies and extensive interpretation of multiple databases. Critical Care Time devoted to patient care services described in this note is 35 minutes.   [name], M.D. [facility] Pulmonary/Critical Care Medicine. Pager:  [phone]. After hours pager: [phone].

## 2014-01-10 NOTE — Progress Notes (Signed)
[place] Progress Note Patient Name: Brady Hoffman DOB: July 04, 1966 MRN: [id number]  Date of Service  01/10/2014   HPI/Events of Note  Approx 230AM patient went into AFib/flutter with RVR with HR into the 170s.  HD stable.  5 mg lopressor not entirely effective.     eICU Interventions  Plan: Dilt 10 mg IV times one dilt gtt Cycle trop Check Mg in addition to BMET   Intervention Category Intermediate Interventions: Arrhythmia - evaluation and management  DETERDING,ELIZABETH 01/10/2014, 2:59 AM

## 2014-01-11 ENCOUNTER — Inpatient Hospital Stay (HOSPITAL_COMMUNITY): Payer: Medicare Other

## 2014-01-11 DIAGNOSIS — F3289 Other specified depressive episodes: Secondary | ICD-10-CM

## 2014-01-11 DIAGNOSIS — F329 Major depressive disorder, single episode, unspecified: Secondary | ICD-10-CM

## 2014-01-11 LAB — BASIC METABOLIC PANEL
BUN: 42 mg/dL — ABNORMAL HIGH (ref 6–23)
CALCIUM: 8 mg/dL — AB (ref 8.4–10.5)
CHLORIDE: 108 meq/L (ref 96–112)
CO2: 24 mEq/L (ref 19–32)
Creatinine, Ser: 5 mg/dL — ABNORMAL HIGH (ref 0.50–1.35)
GFR calc Af Amer: 15 mL/min — ABNORMAL LOW (ref >90)
GFR calc non Af Amer: 13 mL/min — ABNORMAL LOW (ref >90)
GLUCOSE: 119 mg/dL — AB (ref 70–99)
Potassium: 3.9 mEq/L (ref 3.7–5.3)
SODIUM: 149 meq/L — AB (ref 137–147)

## 2014-01-11 LAB — GLUCOSE, CAPILLARY
GLUCOSE-CAPILLARY: 122 mg/dL — AB (ref 70–99)
GLUCOSE-CAPILLARY: 98 mg/dL (ref 70–99)
GLUCOSE-CAPILLARY: 108 mg/dL — AB (ref 70–99)
Glucose-Capillary: 113 mg/dL — ABNORMAL HIGH (ref 70–99)
Glucose-Capillary: 113 mg/dL — ABNORMAL HIGH (ref 70–99)
Glucose-Capillary: 94 mg/dL (ref 70–99)

## 2014-01-11 LAB — POCT I-STAT 3, ART BLOOD GAS (G3+)
ACID-BASE EXCESS: 4 mmol/L — AB (ref 0.0–2.0)
Bicarbonate: 27 mEq/L — ABNORMAL HIGH (ref 20.0–24.0)
O2 SAT: 99 %
PO2 ART: 130 mmHg — AB (ref 80.0–100.0)
Patient temperature: 98.8
TCO2: 28 mmol/L (ref 0–100)
pCO2 arterial: 33.6 mmHg — ABNORMAL LOW (ref 35.0–45.0)
pH, Arterial: 7.513 — ABNORMAL HIGH (ref 7.350–7.450)

## 2014-01-11 MED ORDER — FREE WATER
300.0000 mL | Freq: Four times a day (QID) | Status: DC
  Administered 2014-01-11 – 2014-01-12 (×4): 300 mL

## 2014-01-11 MED ORDER — ASPIRIN 81 MG PO CHEW
81.0000 mg | CHEWABLE_TABLET | Freq: Every day | ORAL | Status: DC
  Administered 2014-01-11 – 2014-01-17 (×7): 81 mg via ORAL
  Filled 2014-01-11 (×7): qty 1

## 2014-01-11 MED ORDER — VENLAFAXINE HCL 75 MG PO TABS
75.0000 mg | ORAL_TABLET | Freq: Two times a day (BID) | ORAL | Status: DC
  Administered 2014-01-12 – 2014-01-17 (×10): 75 mg via ORAL
  Filled 2014-01-11 (×13): qty 1

## 2014-01-11 NOTE — Progress Notes (Signed)
Name: Brady Hoffman MRN: [id number] DOB: September 27, 1966    ADMISSION DATE:  01/02/2014 CONSULTATION DATE: 01/02/14   PRIMARY SERVICE: PCCM   CHIEF COMPLAINT: Altered mental status, Seizure, Acute renal failure   BRIEF PATIENT DESCRIPTION:  48 years old male with Schizoaffective disorder, resident of [facility] home. Was in his usual state of health and sustained a fall with ?head trauma. He then had a generalized seizure. At arrival to the ED on acute renal failure, intubated for airway protection.   SIGNIFICANT EVENTS / STUDIES:  2/1 CT abd/pelvis - unremarkable; LLL consolidation  2/1 CT Head - no acute intracranial abnormalities  2/1 CT C-spine - no acute cervical spine abnormalities  2/1>>> started on CRRT (through 2/3)  2/2 Renal u /s - No hydro  2/2 - Fomepizole started, EG back neg, dce'd  2/3 - EEG - No evidence of epileptiform discharges, No electrographic seizures noted. This is a normal asleep EEG. No electrographic seizures noted.  2/4- more agitated  2/5 - SCr worse  2/5 - too aggitated again for extubation  2/6 MRI >>> neg acute, premature atrophy  2/6 Extubated  2/8 Abdominal xray - NGT in proper place  2/9 EEG - normal sleep pattern. No evidence of significant encephalopathic process seen nor evidence of epileptic d/o  LINES / TUBES:  LIJ 2/1>>>  R Femoral HD cath 2/1>>> 2/8 ETT 2/1>>> 2/6   2/9>>> OGT 2/1>>> 2/6  Foley 2/1>>> 2/5, 2/6>>>  NGT 2/8>>>   CULTURES:  MRSA negative  Blood 2/1 >>> neg  Urine 2/1>>> neg  Urine 2/2>>> neg   ANTIBIOTICS:  Zosyn 2/2>>> 2/3   SUBJECTIVE:  Intubated yesterday for airway protection. In normal sinus rhythm now, still on amio drip. No acute events overnight.  VITAL SIGNS: Temp:  [98.2 F (36.8 C)-99.5 F (37.5 C)] 99.3 F (37.4 C) (02/10 0357) Pulse Rate:  [75-106] 78 (02/10 0600) Resp:  [8-21] 14 (02/10 0600) BP: (90-132)/(57-86) 116/74 mmHg (02/10 0600) SpO2:  [97 %-100 %] 100 % (02/10 0600) FiO2  (%):  [40 %-100 %] 40 % (02/10 0311) Weight:  [89.1 kg (196 lb 6.9 oz)] 89.1 kg (196 lb 6.9 oz) (02/10 0500) HEMODYNAMICS:   VENTILATOR SETTINGS: Vent Mode:  [-] PRVC FiO2 (%):  [40 %-100 %] 40 % Set Rate:  [14 bmp] 14 bmp Vt Set:  [500 mL] 500 mL PEEP:  [5 cmH20] 5 cmH20 Plateau Pressure:  [12 cmH20-14 cmH20] 12 cmH20 INTAKE / OUTPUT: Intake/Output     02/09 0701 - 02/10 0700 02/10 0701 - 02/11 0700   I.V. (mL/kg) 5419 (60.8)    NG/GT     IV Piggyback 207    Total Intake(mL/kg) 5626 (63.1)    Urine (mL/kg/hr) 1360 (0.6)    Total Output 1360     Net +4266           PHYSICAL EXAMINATION: General: Sedated and intubated. NAD  Eyes: Anicteric sclerae, PERRL  ENT: MM dry, no JVD  Heart: Normal S1, S2. No murmurs, rubs, or gallops appreciated.  Lungs: CTAB, no wheezes noted  Abdomen: Soft, NT, ND, BS present No hepatosplenomegaly  Extremities: No LE edema  Skin: warm and dry Neuro: sedated on precedex  LABS:  CBC  Recent Labs Lab 01/07/14 0410 01/08/14 0422 01/09/14 0400  WBC 5.8 10.0 9.8  HGB 8.9* 8.9* 9.4*  HCT 25.5* 26.5* 27.9*  PLT 134* 141* 165   Coag's  Recent Labs Lab 01/05/14 0410  APTT 36   BMET  Recent Labs Lab 01/09/14 0400 01/10/14 0328 01/11/14 0500  NA 148* 150* 149*  K 4.8 4.2 3.9  CL 108 111 108  CO2 15* 19 24  BUN 51* 49* 42*  CREATININE 6.70* 6.00* 5.00*  GLUCOSE 88 123* 119*   Electrolytes  Recent Labs Lab 01/04/14 1117 01/04/14 2214 01/05/14 0130 01/05/14 0410  01/09/14 0400 01/10/14 0328 01/10/14 0329 01/11/14 0500  CALCIUM  --   --   --  7.5*  < > 8.5 8.4  --  8.0*  MG 2.4 2.4 2.4 2.4  --   --   --  2.1  --   PHOS 3.9 5.7* 6.2*  --   --   --   --   --   --   < > = values in this interval not displayed. Sepsis Markers  Recent Labs Lab 01/05/14 0410 01/05/14 0933 01/09/14 1536  LATICACIDVEN  --  0.6 0.6  PROCALCITON 0.33  --   --    ABG  Recent Labs Lab 01/09/14 1434 01/10/14 1317 01/11/14 0408  PHART  7.408 7.418 7.513*  PCO2ART 25.7* 44.2 33.6*  PO2ART 71.0* 368.0* 130.0*   Liver Enzymes No results found for this basename: AST, ALT, ALKPHOS, BILITOT, ALBUMIN,  in the last 168 hours Cardiac Enzymes  Recent Labs Lab 01/10/14 0328 01/10/14 1502  TROPONINI <0.30 <0.30   Glucose  Recent Labs Lab 01/10/14 0821 01/10/14 1218 01/10/14 1556 01/10/14 1858 01/11/14 0001 01/11/14 0356  GLUCAP 140* 116* 100* 118* 113* 113*    Imaging Dg Chest Port 1 View  01/11/2014   CLINICAL DATA:  Acute respiratory failure.  EXAM: PORTABLE CHEST - 1 VIEW  COMPARISON:  01/10/2014 and 01/07/2014  FINDINGS: Endotracheal tube, NG tube, and central venous catheter appear in good position, unchanged. Heart size and vascularity are normal. Slight increased density at the left lung base probably represents a small posterior irregularity fusion.  IMPRESSION: Probable new small left pleural effusion.   Electronically Signed   By: Rozetta Nunnery M.D.   On: 01/11/2014 07:03   Dg Chest Port 1 View  01/10/2014   CLINICAL DATA:  Status post intubation  EXAM: PORTABLE CHEST - 1 VIEW  COMPARISON:  01/07/2014  FINDINGS: An endotracheal tube is noted 5 cm above the carina. A nasogastric catheter is seen extending into the stomach. The cardiac shadow is stable. Previously noted left jugular line is again seen at the junction of the innominate vein and superior vena cava. The lungs are clear.  IMPRESSION: Tubes and lines as described above.  No acute abnormality is noted.   Electronically Signed   By: Inez Catalina M.D.   On: 01/10/2014 12:59   Dg Abd Portable 1v  01/09/2014   CLINICAL DATA:  NG tube placement.  EXAM: PORTABLE ABDOMEN - 1 VIEW  COMPARISON:  10/31/2011  FINDINGS: NG tube is seen with the tip in the body of the stomach. The side port is in the proximal stomach.  Gas throughout nondistended large and small bowel. Right groin dialysis catheter is in place with the tip in the upper pelvis.  IMPRESSION: NG tube in the  body of the stomach.   Electronically Signed   By: Rolm Baptise M.D.   On: 01/09/2014 17:31    CXR: questionable small left pleural effusion  ASSESSMENT / PLAN:   PULMONARY  A:  ?Aspiration pneumonia  Acute resp failure - resolved  Unable to protect airway >>reintubated 2/9 P:  - Begin PS trials, if mental  status is better will likely extubate in AM. - Will change current vent settings per ABG, decrease RR to 12. - CXR and ABG in am. - Continue to monitor off abx.  CARDIOVASCULAR  A:  Afib/flutter with RVR - no known hx  HTN (no known hx)  Hyperlipidemia  P:  - Afib resolved, discontinue amio. - Start aspirin. - EKG reviewed, mild QTc prolongation. - Will recheck EKG tomorrow to assess QTc. - Hold statin.  RENAL  A:  ARF of unclear etiology (no known history) - SCr improving slowly. Family/group home owner at bedside say that during weeks leading up to this event he has been more unstable from psych standpoint, locking himself in bathroom, poor po intake, ?ethylene glycol ingestion (was neg on admit)  AG metabolic acidosis  Hypokalemia --resolved  Methanol Neg  Ethylene glycol Neg, clinical circumstances matches  Hypernatremia 2/2 solute diuresis  P:  - Nephrology following - no further HD planned for now.  - F/u BMET in am.  - Continue free water - D/c bicarb drip - No diuresis for now - Match UOP with 1/2 NS + 50 on an hourly basis   GASTROINTESTINAL  A:  SUP  Last BM 2/7 P:  - Continue Protonix.  - Continue TF  HEMATOLOGIC  A:  Anemia - stable  DVT ppx  P:  - Check FOBT.  - SQH.   INFECTIOUS  A:  Low grade fever overnight No leukocytosis  P:  - All cultures negative to date  - Trend fever curve off abx   ENDOCRINE  A:  Hyperglycemia - controlled  Acetone, blood - moderate  P:  - Continue Novolog SSI per ICU protocol  - Follow CBG  - D/c bicarb gtt  NEUROLOGIC  A:  Schizoaffective disorder  Seizure s/p fall with head trauma  AMS  (post ictal vs uremia vs drug OD / toxin vs home regimen SE)  R/o EG tox, suicide OD?  EEG and MRI - neg  Ethylene glycol <5 (NEG)  UDS neg  2/9 repeat EEG neg P:  - Neurology following  - Continue Keppra  - Wean precedex  - Would consider dantrolene challenge if rigidity noted again or high fevers (none to date)  - Continue clozapine, cogentin - Effexor started back 2/9 at low dose, will taper up - Will need to monitor QTc for clozapine/amio interaction - Call Psych today for assistance with medications.   Today's Summary: Minimize sedation today, continue weaning, if mental status improves by AM will consider extubation.  I have personally obtained a history, examined the patient, evaluated laboratory and imaging results, formulated the assessment and plan and placed orders.  CRITICAL CARE: The patient is critically ill with multiple organ systems failure and requires high complexity decision making for assessment and support, frequent evaluation and titration of therapies, application of advanced monitoring technologies and extensive interpretation of multiple databases. Critical Care Time devoted to patient care services described in this note is 35 minutes.   [name], M.D. [facility] Pulmonary/Critical Care Medicine. Pager: [phone]. After hours pager: [phone].

## 2014-01-11 NOTE — Consult Note (Signed)
[facility] Face-to-Face Psychiatry Consult   Reason for Consult:  Medication management Referring Physician:  Dr. Adriana Simas Whyte is an 48 y.o. male. Total Time spent with patient: 30 minutes  Assessment: AXIS I:  Schizoaffective Disorder AXIS II:  Deferred AXIS III:   Past Medical History  Diagnosis Date  . Hyperlipidemia   . Schizoaffective disorder   . H/O: GI bleed   . Seizures   . Depression   . Chronic kidney disease    AXIS IV:  other psychosocial or environmental problems, problems related to social environment and problems with primary support group AXIS V:  41-50 serious symptoms  Plan:  Patient does not meet criteria for psychiatric inpatient admission.  Subjective:   Brady Hoffman is a 48 y.o. male patient needs to continue with medical assistance at this point due to his head injury.  His psychiatric medications can be restarted Cogentin 2 mg at bedtime to prevent EPS, Clozaril 100 mg at 8 am and 1 pm and 400 mg at bedtime, and Effexor 225 mg daily for depression.  His Prolixin is not recommended to restart.  Dr. Dwyane Dee, psychiatrist, reviewed the patient and concurs with the findings.  HPI:   Patient has schizoaffective disorder and was residing in a group home until he fell and hit his head causing seizures.  The patient was stabilized and ventilated until today.  He is currently posturing with his arms but his knees are bent.  HPI Elements:   Location:  generalized. Quality:  acute. Severity:  severe. Timing:  constant. Duration:  since his head injury. Context:  head injuries.  Interval History: Reviewed psych consultation completed by Waylan Boga, NP and staffed by Dr. Dwyane Dee. Patient was seen and chart reviewed and case discussed with Dr. Nelda Marseille and staff RN who is concern about his psych medication management. Patient has NG tube, IV fluids and Intubated at this time. He is awake, alert and able to respond to quarries with head nods. He has been nodding  his head for anxiety and denied for psychosis and depression. He has denied suicidal or homicidal ideations.   ROS: not performed due to limited mental status.  Past Psychiatric History: Past Medical History  Diagnosis Date  . Hyperlipidemia   . Schizoaffective disorder   . H/O: GI bleed   . Seizures   . Depression   . Chronic kidney disease     reports that he has never smoked. He has never used smokeless tobacco. He reports that he does not drink alcohol or use illicit drugs. History reviewed. No pertinent family history.         Allergies:   Allergies  Allergen Reactions  . Risperidone And Related     Pt. States head feels like a rock    Objective: Blood pressure 142/88, pulse 102, temperature 98.9 F (37.2 C), temperature source Oral, resp. rate 19, height '6\' 2"'  (1.88 m), weight 89.1 kg (196 lb 6.9 oz), SpO2 100.00%.Body mass index is 25.21 kg/(m^2). Results for orders placed during the hospital encounter of 01/02/14 (from the past 72 hour(s))  BASIC METABOLIC PANEL     Status: Abnormal   Collection Time    01/08/14  6:00 PM      Result Value Range   Sodium 148 (*) 137 - 147 mEq/L   Potassium 4.8  3.7 - 5.3 mEq/L   Chloride 108  96 - 112 mEq/L   CO2 15 (*) 19 - 32 mEq/L   Glucose, Bld 77  70 -  99 mg/dL   BUN 47 (*) 6 - 23 mg/dL   Creatinine, Ser 6.92 (*) 0.50 - 1.35 mg/dL   Calcium 8.2 (*) 8.4 - 10.5 mg/dL   GFR calc non Af Amer 8 (*) >90 mL/min   GFR calc Af Amer 10 (*) >90 mL/min   Comment: (NOTE)     The eGFR has been calculated using the CKD EPI equation.     This calculation has not been validated in all clinical situations.     eGFR's persistently <90 mL/min signify possible Chronic Kidney     Disease.  GLUCOSE, CAPILLARY     Status: None   Collection Time    01/08/14  7:24 PM      Result Value Range   Glucose-Capillary 70  70 - 99 mg/dL  GLUCOSE, CAPILLARY     Status: None   Collection Time    01/08/14 11:57 PM      Result Value Range    Glucose-Capillary 89  70 - 99 mg/dL  GLUCOSE, CAPILLARY     Status: None   Collection Time    01/09/14  3:51 AM      Result Value Range   Glucose-Capillary 81  70 - 99 mg/dL  BASIC METABOLIC PANEL     Status: Abnormal   Collection Time    01/09/14  4:00 AM      Result Value Range   Sodium 148 (*) 137 - 147 mEq/L   Potassium 4.8  3.7 - 5.3 mEq/L   Chloride 108  96 - 112 mEq/L   CO2 15 (*) 19 - 32 mEq/L   Glucose, Bld 88  70 - 99 mg/dL   BUN 51 (*) 6 - 23 mg/dL   Creatinine, Ser 6.70 (*) 0.50 - 1.35 mg/dL   Calcium 8.5  8.4 - 10.5 mg/dL   GFR calc non Af Amer 9 (*) >90 mL/min   GFR calc Af Amer 10 (*) >90 mL/min   Comment: (NOTE)     The eGFR has been calculated using the CKD EPI equation.     This calculation has not been validated in all clinical situations.     eGFR's persistently <90 mL/min signify possible Chronic Kidney     Disease.  CBC     Status: Abnormal   Collection Time    01/09/14  4:00 AM      Result Value Range   WBC 9.8  4.0 - 10.5 K/uL   RBC 3.11 (*) 4.22 - 5.81 MIL/uL   Hemoglobin 9.4 (*) 13.0 - 17.0 g/dL   HCT 27.9 (*) 39.0 - 52.0 %   MCV 89.7  78.0 - 100.0 fL   MCH 30.2  26.0 - 34.0 pg   MCHC 33.7  30.0 - 36.0 g/dL   RDW 13.4  11.5 - 15.5 %   Platelets 165  150 - 400 K/uL  GLUCOSE, CAPILLARY     Status: None   Collection Time    01/09/14  7:09 AM      Result Value Range   Glucose-Capillary 86  70 - 99 mg/dL  GLUCOSE, CAPILLARY     Status: None   Collection Time    01/09/14 11:16 AM      Result Value Range   Glucose-Capillary 82  70 - 99 mg/dL  POCT I-STAT 3, BLOOD GAS (G3+)     Status: Abnormal   Collection Time    01/09/14  2:34 PM      Result Value Range   pH,  Arterial 7.408  7.350 - 7.450   pCO2 arterial 25.7 (*) 35.0 - 45.0 mmHg   pO2, Arterial 71.0 (*) 80.0 - 100.0 mmHg   Bicarbonate 16.2 (*) 20.0 - 24.0 mEq/L   TCO2 17  0 - 100 mmol/L   O2 Saturation 94.0     Acid-base deficit 7.0 (*) 0.0 - 2.0 mmol/L   Patient temperature 99.1 F      Collection site RADIAL, ALLEN'S TEST ACCEPTABLE     Drawn by Operator     Sample type ARTERIAL    GLUCOSE, CAPILLARY     Status: None   Collection Time    01/09/14  3:04 PM      Result Value Range   Glucose-Capillary 94  70 - 99 mg/dL  LACTIC ACID, PLASMA     Status: None   Collection Time    01/09/14  3:36 PM      Result Value Range   Lactic Acid, Venous 0.6  0.5 - 2.2 mmol/L  CK     Status: Abnormal   Collection Time    01/09/14  4:00 PM      Result Value Range   Total CK 660 (*) 7 - 232 U/L  KETONES, QUALITATIVE     Status: Abnormal   Collection Time    01/09/14  4:00 PM      Result Value Range   Acetone, Bld MODERATE (*) NEGATIVE  GLUCOSE, CAPILLARY     Status: None   Collection Time    01/09/14  7:30 PM      Result Value Range   Glucose-Capillary 96  70 - 99 mg/dL  GLUCOSE, CAPILLARY     Status: Abnormal   Collection Time    01/09/14 11:50 PM      Result Value Range   Glucose-Capillary 109 (*) 70 - 99 mg/dL  BASIC METABOLIC PANEL     Status: Abnormal   Collection Time    01/10/14  3:28 AM      Result Value Range   Sodium 150 (*) 137 - 147 mEq/L   Potassium 4.2  3.7 - 5.3 mEq/L   Chloride 111  96 - 112 mEq/L   CO2 19  19 - 32 mEq/L   Glucose, Bld 123 (*) 70 - 99 mg/dL   BUN 49 (*) 6 - 23 mg/dL   Creatinine, Ser 6.00 (*) 0.50 - 1.35 mg/dL   Calcium 8.4  8.4 - 10.5 mg/dL   GFR calc non Af Amer 10 (*) >90 mL/min   GFR calc Af Amer 12 (*) >90 mL/min   Comment: (NOTE)     The eGFR has been calculated using the CKD EPI equation.     This calculation has not been validated in all clinical situations.     eGFR's persistently <90 mL/min signify possible Chronic Kidney     Disease.  TROPONIN I     Status: None   Collection Time    01/10/14  3:28 AM      Result Value Range   Troponin I <0.30  <0.30 ng/mL   Comment:            Due to the release kinetics of cTnI,     a negative result within the first hours     of the onset of symptoms does not rule out      myocardial infarction with certainty.     If myocardial infarction is still suspected,     repeat the test at appropriate intervals.  MAGNESIUM     Status: None   Collection Time    01/10/14  3:29 AM      Result Value Range   Magnesium 2.1  1.5 - 2.5 mg/dL  GLUCOSE, CAPILLARY     Status: Abnormal   Collection Time    01/10/14  3:47 AM      Result Value Range   Glucose-Capillary 122 (*) 70 - 99 mg/dL   Comment 1 Notify RN    GLUCOSE, CAPILLARY     Status: Abnormal   Collection Time    01/10/14  8:21 AM      Result Value Range   Glucose-Capillary 140 (*) 70 - 99 mg/dL  GLUCOSE, CAPILLARY     Status: Abnormal   Collection Time    01/10/14 12:18 PM      Result Value Range   Glucose-Capillary 116 (*) 70 - 99 mg/dL  POCT I-STAT 3, BLOOD GAS (G3+)     Status: Abnormal   Collection Time    01/10/14  1:17 PM      Result Value Range   pH, Arterial 7.418  7.350 - 7.450   pCO2 arterial 44.2  35.0 - 45.0 mmHg   pO2, Arterial 368.0 (*) 80.0 - 100.0 mmHg   Bicarbonate 28.5 (*) 20.0 - 24.0 mEq/L   TCO2 30  0 - 100 mmol/L   O2 Saturation 100.0     Acid-Base Excess 4.0 (*) 0.0 - 2.0 mmol/L   Patient temperature 98.4 F     Collection site RADIAL, ALLEN'S TEST ACCEPTABLE     Drawn by Operator     Sample type ARTERIAL    TROPONIN I     Status: None   Collection Time    01/10/14  3:02 PM      Result Value Range   Troponin I <0.30  <0.30 ng/mL   Comment:            Due to the release kinetics of cTnI,     a negative result within the first hours     of the onset of symptoms does not rule out     myocardial infarction with certainty.     If myocardial infarction is still suspected,     repeat the test at appropriate intervals.  GLUCOSE, CAPILLARY     Status: Abnormal   Collection Time    01/10/14  3:56 PM      Result Value Range   Glucose-Capillary 100 (*) 70 - 99 mg/dL  GLUCOSE, CAPILLARY     Status: Abnormal   Collection Time    01/10/14  6:58 PM      Result Value Range    Glucose-Capillary 118 (*) 70 - 99 mg/dL  GLUCOSE, CAPILLARY     Status: Abnormal   Collection Time    01/11/14 12:01 AM      Result Value Range   Glucose-Capillary 113 (*) 70 - 99 mg/dL   Comment 1 Documented in Chart     Comment 2 Notify RN    GLUCOSE, CAPILLARY     Status: Abnormal   Collection Time    01/11/14  3:56 AM      Result Value Range   Glucose-Capillary 113 (*) 70 - 99 mg/dL   Comment 1 Documented in Chart     Comment 2 Notify RN    POCT I-STAT 3, BLOOD GAS (G3+)     Status: Abnormal   Collection Time    01/11/14  4:08 AM  Result Value Range   pH, Arterial 7.513 (*) 7.350 - 7.450   pCO2 arterial 33.6 (*) 35.0 - 45.0 mmHg   pO2, Arterial 130.0 (*) 80.0 - 100.0 mmHg   Bicarbonate 27.0 (*) 20.0 - 24.0 mEq/L   TCO2 28  0 - 100 mmol/L   O2 Saturation 99.0     Acid-Base Excess 4.0 (*) 0.0 - 2.0 mmol/L   Patient temperature 98.8 F     Collection site RADIAL, ALLEN'S TEST ACCEPTABLE     Drawn by RT     Sample type ARTERIAL    BASIC METABOLIC PANEL     Status: Abnormal   Collection Time    01/11/14  5:00 AM      Result Value Range   Sodium 149 (*) 137 - 147 mEq/L   Potassium 3.9  3.7 - 5.3 mEq/L   Chloride 108  96 - 112 mEq/L   CO2 24  19 - 32 mEq/L   Glucose, Bld 119 (*) 70 - 99 mg/dL   BUN 42 (*) 6 - 23 mg/dL   Creatinine, Ser 5.00 (*) 0.50 - 1.35 mg/dL   Calcium 8.0 (*) 8.4 - 10.5 mg/dL   GFR calc non Af Amer 13 (*) >90 mL/min   GFR calc Af Amer 15 (*) >90 mL/min   Comment: (NOTE)     The eGFR has been calculated using the CKD EPI equation.     This calculation has not been validated in all clinical situations.     eGFR's persistently <90 mL/min signify possible Chronic Kidney     Disease.  GLUCOSE, CAPILLARY     Status: Abnormal   Collection Time    01/11/14  8:21 AM      Result Value Range   Glucose-Capillary 122 (*) 70 - 99 mg/dL  GLUCOSE, CAPILLARY     Status: None   Collection Time    01/11/14 11:50 AM      Result Value Range    Glucose-Capillary 98  70 - 99 mg/dL  GLUCOSE, CAPILLARY     Status: Abnormal   Collection Time    01/11/14  3:24 PM      Result Value Range   Glucose-Capillary 108 (*) 70 - 99 mg/dL   Labs are reviewed and are pertinent for medical issues being treatment.  Current Facility-Administered Medications  Medication Dose Route Frequency Provider Last Rate Last Dose  . 0.45 % sodium chloride infusion   Intravenous Continuous Renee A Kuneff, DO 325 mL/hr at 01/11/14 1526    . 0.9 %  sodium chloride infusion  250 mL Intravenous PRN Elsie Stain, MD 20 mL/hr at 01/09/14 1631 250 mL at 01/09/14 1631  . albuterol (PROVENTIL) (2.5 MG/3ML) 0.083% nebulizer solution 2.5 mg  2.5 mg Nebulization Q2H PRN Carin Hock, MD      . antiseptic oral rinse (BIOTENE) solution 15 mL  15 mL Mouth Rinse q12n4p Raylene Miyamoto, MD   15 mL at 01/11/14 1539  . aspirin chewable tablet 81 mg  81 mg Oral Daily [name], MD   81 mg at 01/11/14 1035  . benztropine (COGENTIN) tablet 2 mg  2 mg Oral QHS Marijean Heath, NP   2 mg at 01/10/14 2232  . chlorhexidine (PERIDEX) 0.12 % solution 15 mL  15 mL Mouth Rinse BID Raylene Miyamoto, MD   15 mL at 01/11/14 [id number]  . cloZAPine (CLOZARIL) tablet 100 mg  100 mg Oral Custom Marijean Heath, NP  100 mg at 01/11/14 1400  . cloZAPine (CLOZARIL) tablet 400 mg  400 mg Oral QHS Marijean Heath, NP   400 mg at 01/10/14 2232  . dexmedetomidine (PRECEDEX) 400 MCG/100ML infusion  0.4-1.2 mcg/kg/hr Intravenous Titrated Carin Hock, MD   0.4 mcg/kg/hr at 01/11/14 1020  . fentaNYL (SUBLIMAZE) injection 100 mcg  100 mcg Intravenous Q2H PRN Elsie Stain, MD   100 mcg at 01/11/14 1975  . folic acid injection 1 mg  1 mg Intravenous Daily Raylene Miyamoto, MD   1 mg at 01/11/14 1036  . free water 300 mL  300 mL Per Tube Q6H Louis Meckel, MD   300 mL at 01/11/14 1300  . heparin injection 5,000 Units  5,000 Units Subcutaneous Q8H Carin Hock, MD   5,000 Units at 01/11/14 1400  . insulin aspart (novoLOG) injection 1-3 Units  1-3 Units Subcutaneous Q4H Elsie Stain, MD   1 Units at 01/10/14 0848  . levETIRAcetam (KEPPRA) 500 mg in sodium chloride 0.9 % 100 mL IVPB  500 mg Intravenous Q12H Raylene Miyamoto, MD   500 mg at 01/11/14 1036  . metoprolol (LOPRESSOR) injection 5 mg  5 mg Intravenous Q3H PRN Colbert Coyer, MD   5 mg at 01/10/14 0243  . midazolam (VERSED) injection 1-2 mg  1-2 mg Intravenous Q1H PRN Raylene Miyamoto, MD   2 mg at 01/07/14 1653  . pantoprazole (PROTONIX) injection 40 mg  40 mg Intravenous QHS Raylene Miyamoto, MD   40 mg at 01/10/14 2232  . sodium chloride 0.9 % bolus 125 mL  125 mL Intravenous Once Deno Etienne, MD      . venlafaxine [facility]) tablet 75 mg  75 mg Oral Q breakfast [name], MD   75 mg at 01/11/14 8832   Followed by  . [START ON 01/13/2014] venlafaxine (EFFEXOR) tablet 75 mg  75 mg Oral BID WC [name], MD       Followed by  . [START ON 01/16/2014] venlafaxine (EFFEXOR) tablet 75 mg  75 mg Oral TID WC [name], MD        Psychiatric Specialty Exam:     Blood pressure 142/88, pulse 102, temperature 98.9 F (37.2 C), temperature source Oral, resp. rate 19, height '6\' 2"'  (1.88 m), weight 89.1 kg (196 lb 6.9 oz), SpO2 100.00%.Body mass index is 25.21 kg/(m^2).  General Appearance: Disheveled  Eye Sport and exercise psychologist::  None  Speech:  Negative  Volume:  Decreased  Mood:  Anxious  Affect:  Congruent  Thought Process:  unable to assess  Orientation:  Other:  unable to assess  Thought Content:  unable to assess  Suicidal Thoughts:  Unable to assess  Homicidal Thoughts:  Unable to assess  Memory: unable to assess  Judgement:  Impaired  Insight:  unable to assess  Psychomotor Activity:  Decreased  Concentration:  Poor  Recall:  unable to assess  Fund of Knowledge:unable to assess  Language: unable to assess  Akathisia:  No  Handed:  Right  AIMS (if  indicated):     Assets:  Resilience Social Support  Sleep:      Musculoskeletal: Strength & Muscle Tone: unable to assess Gait & Station: unable to assess Patient leans: unable to assess  Treatment Plan Summary: Daily contact with patient to assess and evaluate symptoms and progress in treatment Medication management  Recommendation 1. Case discussed with [name], MD and staff RN 2. Continue Clozaril  100 mg PO Qam, 1PM and 400 mg Qhs for psychosis 3. Continue Cogentin 2 mg PO Qhs for EPS 4. Change Effexor 15O mg PO QD for depression 5. Appreciate psych consult and will follow up as clinically required 6. May call [phone] for further assistance  Brady Hoffman,JANARDHAHA R. 01/11/2014 4:59 PM

## 2014-01-11 NOTE — Consult Note (Signed)
Discontinue Prolixin as it can make the patient more sedated. Patient is already on Clozaril to help with his schizophrenia. The Effexor can be continued

## 2014-01-11 NOTE — Progress Notes (Signed)
UR Completed.  Vergie Living [phone] 01/11/2014

## 2014-01-11 NOTE — Progress Notes (Signed)
Admit: 01/02/2014 LOS: 9  53M admit after found unconscious with profound renal failure (SCr 20, was SCr 1.1 10/2013), increased anion gap metabolic acidosis, seizure, uremia, hyperkalemia.  AKI w/u: bland UA; C3/C4 WNL; HBV, HCV, HIV neg; renal US unremarkable, CK 462.  No contrast exposure.  Was not on Lithium.  Treated for toxic alcohol ingestion with CRRT and fomepizole but methanol and ethylene glycol levels negative (post CRRT), On CRRT 2/1-2/3.   Now showing renal recovery from unclear insult.   Subjective:  AMS persists- now on amio- needed to be intubated for airway protection- EEG was negative  Good UOP SCr improved again  02/09 0701 - 02/10 0700 In: 5626 [I.V.:5419; IV Piggyback:207] Out: 1360 [Urine:1360]  Filed Weights   01/09/14 0435 01/10/14 0500 01/11/14 0500  Weight: 89.9 kg (198 lb 3.1 oz) 85.7 kg (188 lb 15 oz) 89.1 kg (196 lb 6.9 oz)    Current meds: reviewed  Current Labs: reviewed, C3 and C4 WNL, Serologies negative.    Renal US 01/03/14:  Normal sized, inc echogenicity, no obstruction   Physical Exam:  Blood pressure 116/74, pulse 78, temperature 99.3 F (37.4 C), temperature source Oral, resp. rate 14, height '6\' 2"'  (1.88 m), weight 89.1 kg (196 lb 6.9 oz), SpO2 100.00%. Intubated, sedated regular, nl s1s2 Coarse bs b/l S/nt/nd. No LEE Foley in place No rashes/lesions  Assessment/Plan 1. Dialysis Dependent AKI: Unclear cause, is no longer HD dependent since 2/3.  UA bland.  Renal US unremarkable.  Appears to be recovering.  Exactly what happened remains unclear here.  Dialysis catheter out  2. Inc AG Met Acidosis: EG level (delayed) negative.  Methanol negative.  Had some lactate upon presentation but not sufficient to explain gap.  Was treated with CRRT/Fomepizole for concern of toxic alcohol.  Unusual acidoses such as D-Lactate or oxoproline acidosis theoretically possible, but seem very unlikely.  Stable with mild AG acidosis currently.  Is actually improved  on bicarb drip and with resolving renal failure- will stop bicarb drip 3. Mild hypernatremia:likely a solute diuresis? Once able to swallow can take ad lib free water.  Now on free water thru NGT- will increase slightly 4. Decreased MS- hypernatremia vs psych- psych meds restarted 5. Anemia- stable  I think CCM can take it from here, I anticipate continued recovery- renal will sign off, call with questions   Flor Whitacre A   01/11/2014, 7:23 AM   Recent Labs Lab 01/04/14 1117 01/04/14 2214 01/05/14 0130  01/09/14 0400 01/10/14 0328 01/11/14 0500  NA  --   --   --   < > 148* 150* 149*  K  --   --   --   < > 4.8 4.2 3.9  CL  --   --   --   < > 108 111 108  CO2  --   --   --   < > 15* 19 24  GLUCOSE  --   --   --   < > 88 123* 119*  BUN  --   --   --   < > 51* 49* 42*  CREATININE  --   --   --   < > 6.70* 6.00* 5.00*  CALCIUM  --   --   --   < > 8.5 8.4 8.0*  PHOS 3.9 5.7* 6.2*  --   --   --   --   < > = values in this interval not displayed.  Recent Labs Lab 01/07/14 0410  01/08/14 0422 01/09/14 0400  WBC 5.8 10.0 9.8  HGB 8.9* 8.9* 9.4*  HCT 25.5* 26.5* 27.9*  MCV 90.1 89.5 89.7  PLT 134* 141* 165

## 2014-01-12 ENCOUNTER — Inpatient Hospital Stay (HOSPITAL_COMMUNITY): Payer: Medicare Other

## 2014-01-12 LAB — BASIC METABOLIC PANEL
BUN: 34 mg/dL — ABNORMAL HIGH (ref 6–23)
CO2: 21 meq/L (ref 19–32)
CREATININE: 4.15 mg/dL — AB (ref 0.50–1.35)
Calcium: 7.7 mg/dL — ABNORMAL LOW (ref 8.4–10.5)
Chloride: 105 mEq/L (ref 96–112)
GFR calc Af Amer: 18 mL/min — ABNORMAL LOW (ref >90)
GFR, EST NON AFRICAN AMERICAN: 16 mL/min — AB (ref >90)
Glucose, Bld: 88 mg/dL (ref 70–99)
Potassium: 3.9 mEq/L (ref 3.7–5.3)
Sodium: 144 mEq/L (ref 137–147)

## 2014-01-12 LAB — CBC
HCT: 23.9 % — ABNORMAL LOW (ref 39.0–52.0)
HEMOGLOBIN: 8.4 g/dL — AB (ref 13.0–17.0)
MCH: 31.7 pg (ref 26.0–34.0)
MCHC: 35.1 g/dL (ref 30.0–36.0)
MCV: 90.2 fL (ref 78.0–100.0)
Platelets: 112 10*3/uL — ABNORMAL LOW (ref 150–400)
RBC: 2.65 MIL/uL — AB (ref 4.22–5.81)
RDW: 12.2 % (ref 11.5–15.5)
WBC: 5.1 10*3/uL (ref 4.0–10.5)

## 2014-01-12 LAB — CBC WITH DIFFERENTIAL/PLATELET
Basophils Absolute: 0 10*3/uL (ref 0.0–0.1)
Basophils Relative: 0 % (ref 0–1)
EOS ABS: 0.3 10*3/uL (ref 0.0–0.7)
EOS PCT: 5 % (ref 0–5)
HCT: 24 % — ABNORMAL LOW (ref 39.0–52.0)
Hemoglobin: 8.1 g/dL — ABNORMAL LOW (ref 13.0–17.0)
Lymphocytes Relative: 18 % (ref 12–46)
Lymphs Abs: 1 10*3/uL (ref 0.7–4.0)
MCH: 30.7 pg (ref 26.0–34.0)
MCHC: 33.8 g/dL (ref 30.0–36.0)
MCV: 90.9 fL (ref 78.0–100.0)
Monocytes Absolute: 0.7 10*3/uL (ref 0.1–1.0)
Monocytes Relative: 13 % — ABNORMAL HIGH (ref 3–12)
NEUTROS PCT: 65 % (ref 43–77)
Neutro Abs: 3.6 10*3/uL (ref 1.7–7.7)
Platelets: 125 10*3/uL — ABNORMAL LOW (ref 150–400)
RBC: 2.64 MIL/uL — ABNORMAL LOW (ref 4.22–5.81)
RDW: 12.5 % (ref 11.5–15.5)
WBC: 5.6 10*3/uL (ref 4.0–10.5)

## 2014-01-12 LAB — POCT I-STAT 3, ART BLOOD GAS (G3+)
Acid-base deficit: 2 mmol/L (ref 0.0–2.0)
BICARBONATE: 22.8 meq/L (ref 20.0–24.0)
O2 Saturation: 99 %
PH ART: 7.365 (ref 7.350–7.450)
PO2 ART: 138 mmHg — AB (ref 80.0–100.0)
Patient temperature: 99
TCO2: 24 mmol/L (ref 0–100)
pCO2 arterial: 40 mmHg (ref 35.0–45.0)

## 2014-01-12 LAB — GLUCOSE, CAPILLARY
GLUCOSE-CAPILLARY: 90 mg/dL (ref 70–99)
GLUCOSE-CAPILLARY: 85 mg/dL (ref 70–99)
GLUCOSE-CAPILLARY: 82 mg/dL (ref 70–99)
GLUCOSE-CAPILLARY: 72 mg/dL (ref 70–99)
Glucose-Capillary: 105 mg/dL — ABNORMAL HIGH (ref 70–99)
Glucose-Capillary: 84 mg/dL (ref 70–99)

## 2014-01-12 LAB — PHOSPHORUS: Phosphorus: 4.8 mg/dL — ABNORMAL HIGH (ref 2.3–4.6)

## 2014-01-12 LAB — ALBUMIN: ALBUMIN: 2.3 g/dL — AB (ref 3.5–5.2)

## 2014-01-12 LAB — MAGNESIUM: MAGNESIUM: 1.6 mg/dL (ref 1.5–2.5)

## 2014-01-12 MED ORDER — MAGNESIUM SULFATE 40 MG/ML IJ SOLN
2.0000 g | Freq: Once | INTRAMUSCULAR | Status: AC
  Administered 2014-01-12: 2 g via INTRAVENOUS
  Filled 2014-01-12: qty 50

## 2014-01-12 NOTE — Evaluation (Signed)
Physical Therapy Evaluation Patient Details Name: Brady Hoffman MRN: [id number] DOB: [birth year] Today's Date: 01/12/2014 Time: 7858-8502 PT Time Calculation (min): 31 min  PT Assessment / Plan / Recommendation History of Present Illness  pt presents post Seizure and Fall with Encephalopathy.    Clinical Impression  Pt globally weak and debilitated post extubation this am.  Pt participates well with mobility, though requires extensive 2 person A.  Pt would benefit from CIR at D/C to maximize independence and decrease burden of care upon return to group home.      PT Assessment  Patient needs continued PT services    Follow Up Recommendations  CIR    Does the patient have the potential to tolerate intense rehabilitation      Barriers to Discharge        Equipment Recommendations  None recommended by PT    Recommendations for Other Services OT consult;Rehab consult   Frequency Min 3X/week    Precautions / Restrictions Precautions Precautions: Fall Precaution Comments: Seizure Precautions Restrictions Weight Bearing Restrictions: No   Pertinent Vitals/Pain Denied pain.        Mobility  Bed Mobility Overal bed mobility: Needs Assistance;+2 for physical assistance Bed Mobility: Supine to Sit;Sit to Supine Supine to sit: Max assist;+2 for physical assistance;+2 for safety/equipment Sit to supine: Max assist;+2 for physical assistance;+2 for safety/equipment General bed mobility comments: pt attempts to A with all mobility, though is globally weak.      Exercises     PT Diagnosis: Difficulty walking;Generalized weakness  PT Problem List: Decreased strength;Decreased activity tolerance;Decreased balance;Decreased mobility;Decreased coordination;Decreased knowledge of use of DME;Cardiopulmonary status limiting activity PT Treatment Interventions: DME instruction;Gait training;Stair training;Functional mobility training;Therapeutic activities;Therapeutic exercise;Balance  training;Neuromuscular re-education;Patient/family education;Cognitive remediation     PT Goals(Current goals can be found in the care plan section) Acute Rehab PT Goals Patient Stated Goal: None stated PT Goal Formulation: With patient Time For Goal Achievement: 01/26/14 Potential to Achieve Goals: Good  Visit Information  Last PT Received On: 01/12/14 Assistance Needed: +2 History of Present Illness: pt presents post Seizure and Fall with Encephalopathy.         Prior [place] expects to be discharged to:: Group home Additional Comments: pt indicates he is ambulatory and that the group home staff performed all homemaking tasks for residents.   Prior Function Level of Independence: Independent Communication Communication: No difficulties    Cognition  Cognition Arousal/Alertness: Awake/alert Behavior During Therapy: WFL for tasks assessed/performed Overall Cognitive Status: No family/caregiver present to determine baseline cognitive functioning General Comments: pt with hx of Schizoaffective Disorder, though unclear true baseline as no visitors present.  pt is appropriate and answers all questions.  pt is oriented to loacation and situation and is able to give details about group home.      Extremity/Trunk Assessment Upper Extremity Assessment Upper Extremity Assessment: Defer to OT evaluation Lower Extremity Assessment Lower Extremity Assessment: Generalized weakness   Balance Balance Overall balance assessment: Needs assistance Sitting-balance support: Bilateral upper extremity supported;Feet supported Sitting balance-Leahy Scale: Zero Sitting balance - Comments: pt with strong R lateral lean and attempts to correct this with cueing, but unable to sit at midline without Max A.   Postural control: Right lateral lean  End of Session PT - End of Session Equipment Utilized During Treatment: Oxygen Activity Tolerance: Patient limited by  fatigue Patient left: in bed;with call bell/phone within reach Nurse Communication: Mobility status  GP     Brady Hoffman, Thornton Papas, PT  [phone] 01/12/2014, 3:41 PM

## 2014-01-12 NOTE — Progress Notes (Signed)
NUTRITION FOLLOW UP  Intervention:    If pt unable to take PO's, recommend utilization of 10M PEPuP Protocol: initiate TF via NGT with Vital AF 1.2 at 25 ml/h on day 1; on day 2, increase to goal rate of 70 ml/h (1680 ml per day) to provide 2016 kcals, 126 gm protein, 1362 ml free water daily.  If pt able to take PO's, recommend diet advancement per MD/SLP discretion. RD will continue to monitor oral intake and provide recommendations accordingly.  Nutrition Dx:   Inadequate oral intake related to inability to eat as evidenced by NPO status. Ongoing.  Goal:   Intake to meet >90% of estimated nutrition needs. Unmet.  Monitor:   weight trend, labs, ability to advance diet vs initiation of enteral nutrition  Assessment:   Patient with Schizoaffective disorder, resident of a group home. Was in his usual state of health and sustained a fall with head trauma. He then had a generalized seizure. At arrival to the ED had acute renal failure, intubated for airway protection.   EEG on 2/3 did not reveal seizures. CRRT 2/1 - 2/3. Renal following for daily RRT needs. Pt beginning to show evidence of some renal recovery, per MD. Good UOP.  Intubated 2/1. Extubated 2/6. NGT placed 2/8. Intubated again on 2/9. Extubated 2/11.   SLP to evaluate once patient tolerates being off vent.   Patient is currently NPO with TF being held.   Height: Ht Readings from Last 1 Encounters:  01/02/14 6\' 2"  (1.88 m)    Weight Status:   Wt Readings from Last 1 Encounters:  01/12/14 203 lb 14.8 oz (92.5 kg)  01/03/14  205 lb 0.4 oz (93 kg)  Re-estimated needs:  Kcal: 2100-2300 kcal Protein: 100 - 125 grams Fluid: approx 2.2 liters daily  Skin: no wounds  Diet Order: NPO   Intake/Output Summary (Last 24 hours) at 01/12/14 1500 Last data filed at 01/12/14 1342  Gross per 24 hour  Intake   7440 ml  Output   5865 ml  Net   1575 ml    Last BM: 2/10   Labs:   Recent Labs Lab 01/10/14 0328  01/10/14 0329 01/11/14 0500 01/12/14 0406  NA 150*  --  149* 144  K 4.2  --  3.9 3.9  CL 111  --  108 105  CO2 19  --  24 21  BUN 49*  --  42* 34*  CREATININE 6.00*  --  5.00* 4.15*  CALCIUM 8.4  --  8.0* 7.7*  MG  --  2.1  --  1.6  PHOS  --   --   --  4.8*  GLUCOSE 123*  --  119* 88    CBG (last 3)   Recent Labs  01/12/14 0417 01/12/14 0815 01/12/14 1237  GLUCAP 84 90 85    Scheduled Meds: . antiseptic oral rinse  15 mL Mouth Rinse q12n4p  . aspirin  81 mg Oral Daily  . benztropine  2 mg Oral QHS  . chlorhexidine  15 mL Mouth Rinse BID  . cloZAPine  100 mg Oral 2 times per day  . cloZAPine  400 mg Oral QHS  . folic acid  1 mg Intravenous Daily  . heparin  5,000 Units Subcutaneous 3 times per day  . insulin aspart  1-3 Units Subcutaneous 6 times per day  . levETIRAcetam  500 mg Intravenous Q12H  . pantoprazole (PROTONIX) IV  40 mg Intravenous QHS  . sodium chloride  125  mL Intravenous Once  . venlafaxine  75 mg Oral BID WC    Continuous Infusions: . sodium chloride 425 mL/hr at 01/12/14 1300  . dexmedetomidine Stopped (01/11/14 1036)    Brady Hoffman, Dietetic Intern Pager: [phone]  I agree with student dietitian note; appropriate revisions have been made.  Brady Hoffman, RD, LDN, [place] Pager# [phone] After Hours Pager# [phone]

## 2014-01-12 NOTE — Progress Notes (Addendum)
Name: Zebastian Carico MRN: [id number] DOB: Aug 24, 1966    ADMISSION DATE:  01/02/2014 CONSULTATION DATE: 01/02/14   PRIMARY SERVICE: PCCM   CHIEF COMPLAINT: Altered mental status, Seizure, Acute renal failure   BRIEF PATIENT DESCRIPTION:  48 years old male with Schizoaffective disorder, resident of [facility] home. Was in his usual state of health and sustained a fall with ?head trauma. He then had a generalized seizure. At arrival to the ED on acute renal failure, intubated for airway protection.   SIGNIFICANT EVENTS / STUDIES:  2/1 CT abd/pelvis - unremarkable; LLL consolidation  2/1 CT Head - no acute intracranial abnormalities  2/1 CT C-spine - no acute cervical spine abnormalities  2/1>>> started on CRRT (through 2/3)  2/2 Renal u /s - No hydro  2/2 - Fomepizole started, EG back neg, dce'd  2/3 - EEG - No evidence of epileptiform discharges, No electrographic seizures noted. This is a normal asleep EEG. No electrographic seizures noted.  2/4- more agitated  2/5 - SCr worse  2/5 - too aggitated again for extubation  2/6 MRI >>> neg acute, premature atrophy  2/6 Extubated  2/8 Abdominal xray - NGT in proper place  2/9 EEG - normal sleep pattern. No evidence of significant encephalopathic process seen nor evidence of epileptic d/o   LINES / TUBES:  LIJ 2/1>>>  R Femoral HD cath 2/1>>> 2/8  ETT 2/1>>> 2/6 2/9>>>  OGT 2/1>>> 2/6  Foley 2/1>>> 2/5, 2/6>>>  NGT 2/8>>>   CULTURES:  MRSA negative  Blood 2/1 >>> neg  Urine 2/1>>> neg  Urine 2/2>>> neg   ANTIBIOTICS:  Zosyn 2/2>>> 2/3   SUBJECTIVE:  Intubated. Remains in NSR, no acute events overnight.    VITAL SIGNS: Temp:  [97.8 F (36.6 C)-99 F (37.2 C)] 99 F (37.2 C) (02/11 0400) Pulse Rate:  [75-111] 97 (02/11 0700) Resp:  [8-19] 18 (02/11 0700) BP: (103-149)/(67-88) 133/80 mmHg (02/11 0700) SpO2:  [100 %] 100 % (02/11 0700) FiO2 (%):  [30 %-40 %] 30 % (02/11 0503) Weight:  [92.5 kg (203 lb 14.8 oz)]  92.5 kg (203 lb 14.8 oz) (02/11 0500) HEMODYNAMICS:   VENTILATOR SETTINGS: Vent Mode:  [-] PRVC FiO2 (%):  [30 %-40 %] 30 % Set Rate:  [12 bmp] 12 bmp Vt Set:  [500 mL] 500 mL PEEP:  [5 cmH20] 5 cmH20 Pressure Support:  [5 cmH20] 5 cmH20 Plateau Pressure:  [13 cmH20] 13 cmH20 INTAKE / OUTPUT: Intake/Output     02/10 0701 - 02/11 0700 02/11 0701 - 02/12 0700   I.V. (mL/kg) 6519.8 (70.5)    NG/GT 960    IV Piggyback 210    Total Intake(mL/kg) 7689.8 (83.1)    Urine (mL/kg/hr) 5090 (2.3)    Total Output 5090     Net +2599.8           PHYSICAL EXAMINATION: General: Awake, intubated. NAD. Follows commands. Eyes: Anicteric sclerae, PERRL  ENT: MM moist, no JVD  Heart: Normal S1, S2. No murmurs, rubs, or gallops appreciated.  Lungs: CTAB, no wheezes noted  Abdomen: Soft, NT, ND, BS present No hepatosplenomegaly  Extremities: No LE edema  Skin: warm and dry  Neuro: follows commands  LABS:  CBC  Recent Labs Lab 01/08/14 0422 01/09/14 0400 01/12/14 0406  WBC 10.0 9.8 5.6  HGB 8.9* 9.4* 8.1*  HCT 26.5* 27.9* 24.0*  PLT 141* 165 125*   Coag's No results found for this basename: APTT, INR,  in the last 168 hours BMET  Recent Labs Lab 01/10/14 0328 01/11/14 0500 01/12/14 0406  NA 150* 149* 144  K 4.2 3.9 3.9  CL 111 108 105  CO2 19 24 21   BUN 49* 42* 34*  CREATININE 6.00* 5.00* 4.15*  GLUCOSE 123* 119* 88   Electrolytes  Recent Labs Lab 01/10/14 0328 01/10/14 0329 01/11/14 0500 01/12/14 0406  CALCIUM 8.4  --  8.0* 7.7*  MG  --  2.1  --  1.6  PHOS  --   --   --  4.8*   Sepsis Markers  Recent Labs Lab 01/05/14 0933 01/09/14 1536  LATICACIDVEN 0.6 0.6   ABG  Recent Labs Lab 01/10/14 1317 01/11/14 0408 01/12/14 0500  PHART 7.418 7.513* 7.365  PCO2ART 44.2 33.6* 40.0  PO2ART 368.0* 130.0* 138.0*   Liver Enzymes No results found for this basename: AST, ALT, ALKPHOS, BILITOT, ALBUMIN,  in the last 168 hours Cardiac Enzymes  Recent  Labs Lab 01/10/14 0328 01/10/14 1502  TROPONINI <0.30 <0.30   Glucose  Recent Labs Lab 01/11/14 0821 01/11/14 1150 01/11/14 1524 01/11/14 1853 01/12/14 0010 01/12/14 0417  GLUCAP 122* 98 108* 94 105* 84    Imaging Dg Chest Port 1 View  01/12/2014   CLINICAL DATA:  Followup effusion, shortness of breath.  EXAM: PORTABLE CHEST - 1 VIEW  COMPARISON:  01/11/2014  FINDINGS: Endotracheal tube with tip 5.6 cm above the carina. Left IJ central venous catheter with tip obliquely oriented over the region of the SVC unchanged. Enteric tube courses into the region of the stomach and off the inferior portion of the film as tip is not visualized. Lungs are adequately inflated with minimal left retrocardiac opacification which may be due to mild atelectasis/effusion. There is mild linear atelectasis in the left midlung. Cardiomediastinal silhouette and remainder of the exam is unchanged.  IMPRESSION: Minimal left retrocardiac opacification unchanged and may be due to atelectasis/effusion. Cannot completely exclude infection. Linear atelectasis left midlung.  Tubes and lines as described.   Electronically Signed   By: Marin Olp M.D.   On: 01/12/2014 07:28   Dg Chest Port 1 View  01/11/2014   CLINICAL DATA:  Acute respiratory failure.  EXAM: PORTABLE CHEST - 1 VIEW  COMPARISON:  01/10/2014 and 01/07/2014  FINDINGS: Endotracheal tube, NG tube, and central venous catheter appear in good position, unchanged. Heart size and vascularity are normal. Slight increased density at the left lung base probably represents a small posterior irregularity fusion.  IMPRESSION: Probable new small left pleural effusion.   Electronically Signed   By: Rozetta Nunnery M.D.   On: 01/11/2014 07:03   Dg Chest Port 1 View  01/10/2014   CLINICAL DATA:  Status post intubation  EXAM: PORTABLE CHEST - 1 VIEW  COMPARISON:  01/07/2014  FINDINGS: An endotracheal tube is noted 5 cm above the carina. A nasogastric catheter is seen extending  into the stomach. The cardiac shadow is stable. Previously noted left jugular line is again seen at the junction of the innominate vein and superior vena cava. The lungs are clear.  IMPRESSION: Tubes and lines as described above.  No acute abnormality is noted.   Electronically Signed   By: Inez Catalina M.D.   On: 01/10/2014 12:59   CXR: questionable small left pleural effusion   ASSESSMENT / PLAN:   PULMONARY  A:  ?Aspiration pneumonia  Acute resp failure - resolved  Unable to protect airway >>reintubated 2/9  P:  - SBT to extubate today. - IS and flutter valve. -  Titrate O2. - ABG reviewed. - CXR and ABG in am if still intubated - Continue to monitor off abx.   CARDIOVASCULAR  A:  Afib/flutter with RVR - no known hx  HTN (no known hx)  Hyperlipidemia  P:  - Afib resolved, amio off. - Continue aspirin. - EKG reviewed, mild QTc prolongation.  - Hold statin.   RENAL  A:  ARF of unclear etiology (no known history) - SCr improving slowly. Family/group home owner at bedside say that during weeks leading up to this event he has been more unstable from psych standpoint, locking himself in bathroom, poor po intake, ?ethylene glycol ingestion (was neg on admit)  AG metabolic acidosis  Hypokalemia --resolved  Methanol Neg  Ethylene glycol Neg, clinical circumstances matches  Hypernatremia -- corrected P:  - Nephrology has signed off, SCr continuously downtrending - F/u BMET in am.  - D/c free water, Na WNL - No diuresis for now  - Match UOP with 1/2 NS + 50 on an hourly basis x1 more day then d/c in AM.   GASTROINTESTINAL  A:  SUP  Last BM 2/7  P:  - Continue Protonix.  - D/C TF. - Swallow evaluation.  HEMATOLOGIC  A:  Anemia - stable  DVT ppx  P:  - SQH.   INFECTIOUS  A:  Low grade fever overnight  No leukocytosis  P:  - All cultures negative to date. - Trend fever curve off abx.  ENDOCRINE  A:  Hyperglycemia - controlled  Acetone, blood - moderate  P:   - Continue Novolog SSI per ICU protocol. - Follow CBG.  NEUROLOGIC  A:  Schizoaffective disorder  Seizure s/p fall with head trauma  AMS (post ictal vs uremia vs drug OD / toxin vs home regimen SE)  R/o EG tox, suicide OD?  EEG and MRI - neg  Ethylene glycol <5 (NEG)  UDS neg  2/9 repeat EEG neg  P:  - Neurology following, appreciate recs - Psych following, appreciate recs - Continue Keppra  - Precedex is off - Would consider dantrolene challenge if rigidity noted again or high fevers (none to date)  - Clozapine 100mg  QAM and 1pm, 400mg  QHS for psychosis - Cogentin 2mg  QHS for EPS - Effexor 150mg  daily for depression  Dwyane Dee, Scherrie Bateman, Medical Student 01/12/2014 8:16 AM  TODAY'S SUMMARY: extubate today, continue hydration x1 more day (continues to diurese) and check output again in AM.  I have personally obtained a history, examined the patient, evaluated laboratory and imaging results, formulated the assessment and plan and placed orders.  CRITICAL CARE: The patient is critically ill with multiple organ systems failure and requires high complexity decision making for assessment and support, frequent evaluation and titration of therapies, application of advanced monitoring technologies and extensive interpretation of multiple databases. Critical Care Time devoted to patient care services described in this note is 35 minutes.   [name], M.D. [facility] Pulmonary/Critical Care Medicine. Pager: [phone]. After hours pager: [phone].

## 2014-01-12 NOTE — Progress Notes (Addendum)
SLP Cancellation Note  Patient Details Name: Brady Hoffman MRN: [id number] DOB: 04/12/1966   Cancelled treatment:        Pt just extubated this morning following extended/multiple intubations.  Will hold on swallow evaluation for now to insure pt tolerates being off vent.  Will recheck next date.  Emannuel Vise B. Quentin Ore [facility], CCC-SLP [phone]  Shonna Chock 01/12/2014, 12:06 PM

## 2014-01-12 NOTE — Progress Notes (Signed)
Rehab Admissions Coordinator Note:  Patient was screened by Retta Diones for appropriateness for an Inpatient Acute Rehab Consult.  At this time, we are recommending Inpatient Rehab consult.  Retta Diones 01/12/2014, 3:49 PM  I can be reached at [phone].

## 2014-01-12 NOTE — Progress Notes (Addendum)
Subjective: Patient extubated this AM without difficulty.  Follows commands and showing improvement in mental status over night. No further seizure activity. Remains on Keppra 500 mg BID  Repeat EEG showed no epileptiform activity Phosphorus 4.8 Mg+ 1.6 Cr. 4.15 (improved from 5.0)     Objective: Current vital signs: BP 141/86  Pulse 98  Temp(Src) 98.6 F (37 C) (Oral)  Resp 12  Ht 6\' 2"  (1.88 m)  Wt 92.5 kg (203 lb 14.8 oz)  BMI 26.17 kg/m2  SpO2 100% Vital signs in last 24 hours: Temp:  [98.4 F (36.9 C)-99 F (37.2 C)] 98.6 F (37 C) (02/11 0944) Pulse Rate:  [85-111] 98 (02/11 0831) Resp:  [8-19] 12 (02/11 0831) BP: (111-149)/(67-88) 141/86 mmHg (02/11 0831) SpO2:  [100 %] 100 % (02/11 0831) FiO2 (%):  [30 %-40 %] 30 % (02/11 0832) Weight:  [92.5 kg (203 lb 14.8 oz)] 92.5 kg (203 lb 14.8 oz) (02/11 0500)  Intake/Output from previous day: 02/10 0701 - 02/11 0700 In: 7689.8 [I.V.:6519.8; NG/GT:960; IV Piggyback:210] Out: 5090 [Urine:5090] Intake/Output this shift: Total I/O In: 330 [I.V.:330] Out: 275 [Urine:275] Nutritional status: NPO  Neurologic Exam: Mental Status: slightly drowsy, believes he is in [employer], asking to have [place] for dinner tonight.  Speech hoars (just extubated) without evidence of aphasia.  Able to follow simple commands without difficulty. Cranial Nerves: II: Visual fields grossly normal, pupils equal, round, reactive to light and accommodation III,IV, VI: ptosis not present, extra-ocular motions intact bilaterally V,VII: smile symmetric, facial light touch sensation normal bilaterally VIII: hearing normal bilaterally IX,X: gag reflex present XI: bilateral shoulder shrug XII: midline tongue extension without atrophy or fasciculations  Motor: Able to lift all extremities against gravity Sensory: Pinprick and light touch intact throughout, bilaterally Deep Tendon Reflexes:  Right: Upper Extremity   Left: Upper extremity    biceps (C-5 to C-6) 2/4   biceps (C-5 to C-6) 2/4 tricep (C7) 2/4    triceps (C7) 2/4 Brachioradialis (C6) 2/4  Brachioradialis (C6) 2/4  Lower Extremity Lower Extremity  quadriceps (L-2 to L-4) 2/4   quadriceps (L-2 to L-4) 2/4 Achilles (S1) 2/4   Achilles (S1) 2/4  Plantars: Mute bilaterally Cerebellar: normal finger-to-nose,   CV: pulses palpable throughout    Lab Results: Basic Metabolic Panel:  Recent Labs Lab 01/08/14 1800 01/09/14 0400 01/10/14 0328 01/10/14 0329 01/11/14 0500 01/12/14 0406  NA 148* 148* 150*  --  149* 144  K 4.8 4.8 4.2  --  3.9 3.9  CL 108 108 111  --  108 105  CO2 15* 15* 19  --  24 21  GLUCOSE 77 88 123*  --  119* 88  BUN 47* 51* 49*  --  42* 34*  CREATININE 6.92* 6.70* 6.00*  --  5.00* 4.15*  CALCIUM 8.2* 8.5 8.4  --  8.0* 7.7*  MG  --   --   --  2.1  --  1.6  PHOS  --   --   --   --   --  4.8*    Liver Function Tests:  Recent Labs Lab 01/12/14 0406  ALBUMIN 2.3*   No results found for this basename: LIPASE, AMYLASE,  in the last 168 hours No results found for this basename: AMMONIA,  in the last 168 hours  CBC:  Recent Labs Lab 01/06/14 0500 01/07/14 0410 01/08/14 0422 01/09/14 0400 01/12/14 0406  WBC 6.9 5.8 10.0 9.8 5.6  NEUTROABS  --   --   --   --  3.6  HGB 9.7* 8.9* 8.9* 9.4* 8.1*  HCT 27.8* 25.5* 26.5* 27.9* 24.0*  MCV 88.3 90.1 89.5 89.7 90.9  PLT 131* 134* 141* 165 125*    Cardiac Enzymes:  Recent Labs Lab 01/09/14 1600 01/10/14 0328 01/10/14 1502  CKTOTAL 660*  --   --   TROPONINI  --  <0.30 <0.30    Lipid Panel:  Recent Labs Lab 01/06/14 1800  TRIG 176*    CBG:  Recent Labs Lab 01/11/14 1524 01/11/14 1853 01/12/14 0010 01/12/14 0417 01/12/14 0815  GLUCAP 108* 94 105* 48 90    Microbiology: Results for orders placed during the hospital encounter of 01/02/14  MRSA PCR SCREENING     Status: None   Collection Time    01/02/14  8:33 PM      Result Value Ref Range Status   MRSA  by PCR NEGATIVE  NEGATIVE Final   Comment:            The GeneXpert MRSA Assay (FDA     approved for NASAL specimens     only), is one component of a     comprehensive MRSA colonization     surveillance program. It is not     intended to diagnose MRSA     infection nor to guide or     monitor treatment for     MRSA infections.  URINE CULTURE     Status: None   Collection Time    01/02/14  8:59 PM      Result Value Ref Range Status   Specimen Description URINE, CATHETERIZED   Final   Special Requests NONE   Final   Culture  Setup Time     Final   Value: 01/02/2014 22:49     Performed at [facility]     Final   Value: NO GROWTH     Performed at [facility]   Culture     Final   Value: NO GROWTH     Performed at [facility]   Report Status 01/04/2014 FINAL   Final  CULTURE, BLOOD (ROUTINE X 2)     Status: None   Collection Time    01/02/14 10:05 PM      Result Value Ref Range Status   Specimen Description BLOOD LEFT HAND   Final   Special Requests BOTTLES DRAWN AEROBIC ONLY 10CC   Final   Culture  Setup Time     Final   Value: 01/03/2014 08:34     Performed at [facility]   Culture     Final   Value: NO GROWTH 5 DAYS     Performed at [facility]   Report Status 01/09/2014 FINAL   Final  CULTURE, BLOOD (ROUTINE X 2)     Status: None   Collection Time    01/02/14 10:15 PM      Result Value Ref Range Status   Specimen Description BLOOD RIGHT GROIN CENTRAL LINE   Final   Special Requests BOTTLES DRAWN AEROBIC ONLY 10CC   Final   Culture  Setup Time     Final   Value: 01/03/2014 08:34     Performed at [facility]   Culture     Final   Value: NO GROWTH 5 DAYS     Performed at [facility]   Report Status 01/09/2014 FINAL   Final  URINE CULTURE     Status: None  Collection Time    01/03/14  1:00 PM      Result Value Ref Range Status   Specimen Description URINE, CATHETERIZED   Final   Special  Requests NONE   Final   Culture  Setup Time     Final   Value: 01/03/2014 20:32     Performed at [place]     Final   Value: NO GROWTH     Performed at [facility]   Culture     Final   Value: NO GROWTH     Performed at [facility]   Report Status 01/04/2014 FINAL   Final    Coagulation Studies: No results found for this basename: LABPROT, INR,  in the last 72 hours  Imaging: Dg Chest Port 1 View  01/12/2014   CLINICAL DATA:  Followup effusion, shortness of breath.  EXAM: PORTABLE CHEST - 1 VIEW  COMPARISON:  01/11/2014  FINDINGS: Endotracheal tube with tip 5.6 cm above the carina. Left IJ central venous catheter with tip obliquely oriented over the region of the SVC unchanged. Enteric tube courses into the region of the stomach and off the inferior portion of the film as tip is not visualized. Lungs are adequately inflated with minimal left retrocardiac opacification which may be due to mild atelectasis/effusion. There is mild linear atelectasis in the left midlung. Cardiomediastinal silhouette and remainder of the exam is unchanged.  IMPRESSION: Minimal left retrocardiac opacification unchanged and may be due to atelectasis/effusion. Cannot completely exclude infection. Linear atelectasis left midlung.  Tubes and lines as described.   Electronically Signed   By: Marin Olp M.D.   On: 01/12/2014 07:28   Dg Chest Port 1 View  01/11/2014   CLINICAL DATA:  Acute respiratory failure.  EXAM: PORTABLE CHEST - 1 VIEW  COMPARISON:  01/10/2014 and 01/07/2014  FINDINGS: Endotracheal tube, NG tube, and central venous catheter appear in good position, unchanged. Heart size and vascularity are normal. Slight increased density at the left lung base probably represents a small posterior irregularity fusion.  IMPRESSION: Probable new small left pleural effusion.   Electronically Signed   By: Rozetta Nunnery M.D.   On: 01/11/2014 07:03   Dg Chest Port 1  View  01/10/2014   CLINICAL DATA:  Status post intubation  EXAM: PORTABLE CHEST - 1 VIEW  COMPARISON:  01/07/2014  FINDINGS: An endotracheal tube is noted 5 cm above the carina. A nasogastric catheter is seen extending into the stomach. The cardiac shadow is stable. Previously noted left jugular line is again seen at the junction of the innominate vein and superior vena cava. The lungs are clear.  IMPRESSION: Tubes and lines as described above.  No acute abnormality is noted.   Electronically Signed   By: Inez Catalina M.D.   On: 01/10/2014 12:59   EEG Interpretation: This EEG recording shows a pattern consistent with normal sleep, as seen with previous EEG. No evidence of a significant encephalopathic process was seen, nor evidence of an epileptic disorder.  Medications:  Scheduled: . antiseptic oral rinse  15 mL Mouth Rinse q12n4p  . aspirin  81 mg Oral Daily  . benztropine  2 mg Oral QHS  . chlorhexidine  15 mL Mouth Rinse BID  . cloZAPine  100 mg Oral 2 times per day  . cloZAPine  400 mg Oral QHS  . folic acid  1 mg Intravenous Daily  . free water  300 mL Per Tube Q6H  .  heparin  5,000 Units Subcutaneous 3 times per day  . insulin aspart  1-3 Units Subcutaneous 6 times per day  . levETIRAcetam  500 mg Intravenous Q12H  . pantoprazole (PROTONIX) IV  40 mg Intravenous QHS  . sodium chloride  125 mL Intravenous Once  . venlafaxine  75 mg Oral BID WC    Assessment/Plan: Patient improved.  No further seizure activity noted.  EEG shows no epileptiform activity.  Encephalopathy and seizure likely metabolic in nature.  Patient on keppra at 500mg  BID nad tolerating well.  Recommendations: 1.  Continue Keppra at current dose.  Will likely be able to be discontinued as an outpatient.   2.  Agree with current management.        LOS: 10 days   Alexis Goodell, MD [facility] [phone]  01/12/2014  9:54 AM

## 2014-01-12 NOTE — Procedures (Signed)
Extubation Procedure Note  Patient Details:   Name: Brady Hoffman DOB: 03-Jun-1966 MRN: [id number]   Airway Documentation:     Evaluation  O2 sats: stable throughout Complications: No apparent complications Patient did tolerate procedure well. Bilateral Breath Sounds: Rhonchi;Diminished Suctioning: Airway Yes Pt extubed per md order. Pt placed on 2L nasal cannula. Pt is stable and alert.  Cordella Register 01/12/2014, 10:11 AM

## 2014-01-13 ENCOUNTER — Inpatient Hospital Stay (HOSPITAL_COMMUNITY): Payer: Medicare Other

## 2014-01-13 DIAGNOSIS — G929 Unspecified toxic encephalopathy: Secondary | ICD-10-CM

## 2014-01-13 DIAGNOSIS — R569 Unspecified convulsions: Secondary | ICD-10-CM

## 2014-01-13 DIAGNOSIS — G92 Toxic encephalopathy: Secondary | ICD-10-CM

## 2014-01-13 LAB — BLOOD GAS, ARTERIAL
Acid-base deficit: 5.5 mmol/L — ABNORMAL HIGH (ref 0.0–2.0)
Bicarbonate: 19.6 mEq/L — ABNORMAL LOW (ref 20.0–24.0)
FIO2: 0.28 %
O2 SAT: 98.3 %
PH ART: 7.32 — AB (ref 7.350–7.450)
Patient temperature: 98.6
TCO2: 20.8 mmol/L (ref 0–100)
pCO2 arterial: 39.1 mmHg (ref 35.0–45.0)
pO2, Arterial: 116 mmHg — ABNORMAL HIGH (ref 80.0–100.0)

## 2014-01-13 LAB — CBC
HCT: 23.9 % — ABNORMAL LOW (ref 39.0–52.0)
Hemoglobin: 8.3 g/dL — ABNORMAL LOW (ref 13.0–17.0)
MCH: 31.1 pg (ref 26.0–34.0)
MCHC: 34.7 g/dL (ref 30.0–36.0)
MCV: 89.5 fL (ref 78.0–100.0)
Platelets: 129 10*3/uL — ABNORMAL LOW (ref 150–400)
RBC: 2.67 MIL/uL — AB (ref 4.22–5.81)
RDW: 12 % (ref 11.5–15.5)
WBC: 4.6 10*3/uL (ref 4.0–10.5)

## 2014-01-13 LAB — COMPREHENSIVE METABOLIC PANEL
ALBUMIN: 2.5 g/dL — AB (ref 3.5–5.2)
ALT: 13 U/L (ref 0–53)
AST: 10 U/L (ref 0–37)
Alkaline Phosphatase: 90 U/L (ref 39–117)
BUN: 30 mg/dL — ABNORMAL HIGH (ref 6–23)
CALCIUM: 8 mg/dL — AB (ref 8.4–10.5)
CO2: 18 mEq/L — ABNORMAL LOW (ref 19–32)
CREATININE: 3.33 mg/dL — AB (ref 0.50–1.35)
Chloride: 107 mEq/L (ref 96–112)
GFR calc Af Amer: 24 mL/min — ABNORMAL LOW (ref >90)
GFR calc non Af Amer: 21 mL/min — ABNORMAL LOW (ref >90)
Glucose, Bld: 75 mg/dL (ref 70–99)
Potassium: 3.9 mEq/L (ref 3.7–5.3)
Sodium: 145 mEq/L (ref 137–147)
Total Bilirubin: 0.3 mg/dL (ref 0.3–1.2)
Total Protein: 5.7 g/dL — ABNORMAL LOW (ref 6.0–8.3)

## 2014-01-13 LAB — PHOSPHORUS: Phosphorus: 4.5 mg/dL (ref 2.3–4.6)

## 2014-01-13 LAB — GLUCOSE, CAPILLARY
GLUCOSE-CAPILLARY: 77 mg/dL (ref 70–99)
GLUCOSE-CAPILLARY: 85 mg/dL (ref 70–99)
Glucose-Capillary: 76 mg/dL (ref 70–99)
Glucose-Capillary: 80 mg/dL (ref 70–99)

## 2014-01-13 LAB — MAGNESIUM: MAGNESIUM: 1.8 mg/dL (ref 1.5–2.5)

## 2014-01-13 MED ORDER — MAGNESIUM SULFATE 4000MG/100ML IJ SOLN
4.0000 g | Freq: Once | INTRAMUSCULAR | Status: DC
  Administered 2014-01-13: 4 g via INTRAVENOUS
  Filled 2014-01-13 (×2): qty 100

## 2014-01-13 MED ORDER — RESOURCE THICKENUP CLEAR PO POWD
ORAL | Status: DC | PRN
  Filled 2014-01-13: qty 125

## 2014-01-13 MED ORDER — LEVETIRACETAM ER 500 MG PO TB24
500.0000 mg | ORAL_TABLET | Freq: Two times a day (BID) | ORAL | Status: DC
  Administered 2014-01-13 – 2014-01-17 (×9): 500 mg via ORAL
  Filled 2014-01-13 (×11): qty 1

## 2014-01-13 NOTE — Progress Notes (Signed)
Report called to Caryl Pina, Therapist, sports. Pt transferred via bed to [facility] room 26. Pt's clothing at bedside. Pt's family notified of transfer. [place] notified.

## 2014-01-13 NOTE — Consult Note (Signed)
Physical Medicine and Rehabilitation Consult Reason for Consult: Encephalopathy/seizure disorder Referring Physician: Critical care   HPI: Brady Hoffman is a 48 y.o. right-handed male with history of schizoaffective disorder resident of a group home x12 years. Admitted 01/02/2014 after a fall struck his head from ground level. Patient without complaints then developed seizure-like activity and mental status changes. Patient was intubated in the emergency department. Cranial CT scan cervical CT spine negative without acute abnormalities. MRI of the brain negative. CT abdomen and pelvis unremarkable. Urine drug screen negative. Chemistries revealed a creatinine of 20.63 from a baseline of 1.09 with BUN 130 potassium 5.7. Nephrology services consulted placed on IV fluids. Renal ultrasound negative for hydronephrosis. Neurology services consulted for noted seizure with EEG showing pattern consistent with normal sleep no evidence of encephalopathic process seen. Patient maintained on Keppra therapy. Subcutaneous heparin for DVT prophylaxis. A nasogastric tube remained in place. Patient was extubated 01/12/2014. Patient did require hemodialysis for a short time with last dialysis 01/04/2014 with latest creatinine 4.15. Physical therapy evaluation completed 01/12/2014 with recommendations for physical medicine rehabilitation consult  Review of Systems  Psychiatric/Behavioral: Positive for depression.       Schizoaffective disorder  All other systems reviewed and are negative.   Past Medical History  Diagnosis Date  . Hyperlipidemia   . Schizoaffective disorder   . H/O: GI bleed   . Seizures   . Depression   . Chronic kidney disease    Past Surgical History  Procedure Laterality Date  . Mole removal    . Wisdom tooth extraction    . Esophagogastroduodenoscopy  11/01/2011    Procedure: ESOPHAGOGASTRODUODENOSCOPY (EGD);  Surgeon: Beryle Beams;  Location: [facility];  Service:  Endoscopy;  Laterality: N/A;   History reviewed. No pertinent family history. Social History:  reports that he has never smoked. He has never used smokeless tobacco. He reports that he does not drink alcohol or use illicit drugs. Allergies:  Allergies  Allergen Reactions  . Risperidone And Related     Pt. States head feels like a rock   Medications Prior to Admission  Medication Sig Dispense Refill  . atorvastatin (LIPITOR) 20 MG tablet Take 20 mg by mouth at bedtime.      . benztropine (COGENTIN) 1 MG tablet Take 2 mg by mouth at bedtime.      . Cholecalciferol (VITAMIN D3) 5000 UNITS TABS Take 5,000 Units by mouth at bedtime.      . cloZAPine (CLOZARIL) 100 MG tablet Take 100-400 mg by mouth 2 (two) times daily. Take 1 tablet (100  Mg) twice daily at 8am and 1pm, take 4 tablets (400 mg) at bedtime      . docusate sodium (COLACE) 100 MG capsule Take 200 mg by mouth 2 (two) times daily.       . fluPHENAZine (PROLIXIN) 5 MG tablet Take 5 mg by mouth 2 (two) times daily.      . methylcellulose (CITRUCEL FIBER LAXATIVE) packet Take by mouth 3 (three) times daily as needed for constipation. Take as directed on label      . omeprazole (PRILOSEC) 40 MG capsule Take 40 mg by mouth daily.      . polyethylene glycol (MIRALAX / GLYCOLAX) packet Take 17 g by mouth daily. Mix 1 capful in 4-8 oz of water or juice by mouth daily      . senna (SENOKOT) 8.6 MG TABS tablet Take 2 tablets by mouth at bedtime as needed for mild constipation.      Marland Kitchen  venlafaxine XR (EFFEXOR-XR) 75 MG 24 hr capsule Take 225 mg by mouth daily.        Home: Home Living Family/patient expects to be discharged to:: Group home Additional Comments: pt indicates he is ambulatory and that the group home staff performed all homemaking tasks for residents.    Functional History:   Functional Status:  Mobility:          ADL:    Cognition: Cognition Overall Cognitive Status: No family/caregiver present to determine baseline  cognitive functioning Orientation Level: Oriented X4 Cognition Arousal/Alertness: Awake/alert Behavior During Therapy: WFL for tasks assessed/performed Overall Cognitive Status: No family/caregiver present to determine baseline cognitive functioning General Comments: pt with hx of Schizoaffective Disorder, though unclear true baseline as no visitors present.  pt is appropriate and answers all questions.  pt is oriented to loacation and situation and is able to give details about group home.    Blood pressure 127/73, pulse 94, temperature 98.4 F (36.9 C), temperature source Oral, resp. rate 9, height 6\' 2"  (1.88 m), weight 90.7 kg (199 lb 15.3 oz), SpO2 100.00%. Physical Exam  Vitals reviewed. HENT:  Head: Normocephalic.  Eyes: EOM are normal.  No nystagmus  Neck: Normal range of motion. Neck supple. No thyromegaly present.  Cardiovascular: Normal rate and regular rhythm.   Respiratory: Effort normal and breath sounds normal. No respiratory distress.  GI: Soft. Bowel sounds are normal. He exhibits no distension.  Neurological: He is alert.  Patient was pleasant during exam a good eye contact. He was not able to state his age but was able to give appropriate date of birth. Told me how long he was at the group home.  He did follow simple commands. He was a very limited medical historian. Moved all 4 with tactile and verbal cueing. Very weak  Skin: Skin is warm and dry.    Results for orders placed during the hospital encounter of 01/02/14 (from the past 24 hour(s))  GLUCOSE, CAPILLARY     Status: None   Collection Time    01/12/14  8:15 AM      Result Value Ref Range   Glucose-Capillary 90  70 - 99 mg/dL  CBC     Status: Abnormal   Collection Time    01/12/14 11:00 AM      Result Value Ref Range   WBC 5.1  4.0 - 10.5 K/uL   RBC 2.65 (*) 4.22 - 5.81 MIL/uL   Hemoglobin 8.4 (*) 13.0 - 17.0 g/dL   HCT 23.9 (*) 39.0 - 52.0 %   MCV 90.2  78.0 - 100.0 fL   MCH 31.7  26.0 - 34.0 pg    MCHC 35.1  30.0 - 36.0 g/dL   RDW 12.2  11.5 - 15.5 %   Platelets 112 (*) 150 - 400 K/uL  GLUCOSE, CAPILLARY     Status: None   Collection Time    01/12/14 12:37 PM      Result Value Ref Range   Glucose-Capillary 85  70 - 99 mg/dL  GLUCOSE, CAPILLARY     Status: None   Collection Time    01/12/14  3:29 PM      Result Value Ref Range   Glucose-Capillary 82  70 - 99 mg/dL  GLUCOSE, CAPILLARY     Status: None   Collection Time    01/12/14  7:16 PM      Result Value Ref Range   Glucose-Capillary 72  70 - 99 mg/dL  GLUCOSE, CAPILLARY  Status: None   Collection Time    01/13/14 12:02 AM      Result Value Ref Range   Glucose-Capillary 80  70 - 99 mg/dL  BLOOD GAS, ARTERIAL     Status: Abnormal   Collection Time    01/13/14  3:44 AM      Result Value Ref Range   FIO2 0.28     Delivery systems NASAL CANNULA     pH, Arterial 7.320 (*) 7.350 - 7.450   pCO2 arterial 39.1  35.0 - 45.0 mmHg   pO2, Arterial 116.0 (*) 80.0 - 100.0 mmHg   Bicarbonate 19.6 (*) 20.0 - 24.0 mEq/L   TCO2 20.8  0 - 100 mmol/L   Acid-base deficit 5.5 (*) 0.0 - 2.0 mmol/L   O2 Saturation 98.3     Patient temperature 98.6     Collection site RIGHT RADIAL     Drawn by COLLECTED BY RT     Sample type ARTERIAL DRAW     Allens test (pass/fail) PASS  PASS  GLUCOSE, CAPILLARY     Status: None   Collection Time    01/13/14  4:09 AM      Result Value Ref Range   Glucose-Capillary 76  70 - 99 mg/dL   Dg Chest Port 1 View  01/12/2014   CLINICAL DATA:  Followup effusion, shortness of breath.  EXAM: PORTABLE CHEST - 1 VIEW  COMPARISON:  01/11/2014  FINDINGS: Endotracheal tube with tip 5.6 cm above the carina. Left IJ central venous catheter with tip obliquely oriented over the region of the SVC unchanged. Enteric tube courses into the region of the stomach and off the inferior portion of the film as tip is not visualized. Lungs are adequately inflated with minimal left retrocardiac opacification which may be due to  mild atelectasis/effusion. There is mild linear atelectasis in the left midlung. Cardiomediastinal silhouette and remainder of the exam is unchanged.  IMPRESSION: Minimal left retrocardiac opacification unchanged and may be due to atelectasis/effusion. Cannot completely exclude infection. Linear atelectasis left midlung.  Tubes and lines as described.   Electronically Signed   By: Marin Olp M.D.   On: 01/12/2014 07:28    Assessment/Plan: Diagnosis: metabolic/toxic encephalopathy 1. Does the need for close, 24 hr/day medical supervision in concert with the patient's rehab needs make it unreasonable for this patient to be served in a less intensive setting? Yes 2. Co-Morbidities requiring supervision/potential complications: schizoaffective disorder, seizures 3. Due to bladder management, bowel management, safety, skin/wound care, disease management, medication administration, pain management and patient education, does the patient require 24 hr/day rehab nursing? Yes 4. Does the patient require coordinated care of a physician, rehab nurse, PT (1-2 hrs/day, 5 days/week), OT (1-2 hrs/day, 5 days/week) and SLP (1-2 hrs/day, 5 days/week) to address physical and functional deficits in the context of the above medical diagnosis(es)? Yes Addressing deficits in the following areas: balance, endurance, locomotion, strength, transferring, bowel/bladder control, bathing, dressing, feeding, grooming, toileting, cognition, speech, swallowing and psychosocial support 5. Can the patient actively participate in an intensive therapy program of at least 3 hrs of therapy per day at least 5 days per week? Yes and Potentially 6. The potential for patient to make measurable gains while on inpatient rehab is excellent 7. Anticipated functional outcomes upon discharge from inpatient rehab are supervision with PT, supervision with OT, supervision with SLP. 8. Estimated rehab length of stay to reach the above functional goals  is: 15-20 days 9. Does the patient have adequate  social supports to accommodate these discharge functional goals? Potentially 10. Anticipated D/C setting: Home 11. Anticipated post D/C treatments: [facility] therapy 12. Overall Rehab/Functional Prognosis: excellent  RECOMMENDATIONS: This patient's condition is appropriate for continued rehabilitative care in the following setting: CIR Patient has agreed to participate in recommended program. Potentially Note that insurance prior authorization may be required for reimbursement for recommended care.  Comment: Spoke with mother at length. She tells me that the group home will likely be able to provide some additional assist upon dc. He should do very well on inpatient rehab however, likely reaching a supervision level.  Meredith Staggers, MD, [facility]     01/13/2014

## 2014-01-13 NOTE — Progress Notes (Signed)
Patient transferred to [facility] room 26.  Patient oriented to unit and room, use of call bell and telephone.  Patient verbalized understanding.  Verrified armband with patient name and birthday.    Arrival method: Bed from ICU, patient transferred to unit bed Orientation: Patient oriented x4 Telemetry: telemetry box 26 placed on patient, CCMT notified Assessment: see doc flowsheets IV: LIJ, IV team notified of patient transfer Pain: patient denies pain at this time Isolation: none Safety Measures: Patient educated on fall precautions.  Yellow socks on patient.  Bed alarm on.  Dysphagia precautions at patient bedside.

## 2014-01-13 NOTE — Evaluation (Signed)
Clinical/Bedside Swallow Evaluation Patient Details  Name: Brady Hoffman MRN: [id number] Date of Birth: 06/20/1966  Today's Date: 01/13/2014 Time: 0258-5277 SLP Time Calculation (min): 17 min  Past Medical History:  Past Medical History  Diagnosis Date  . Hyperlipidemia   . Schizoaffective disorder   . H/O: GI bleed   . Seizures   . Depression   . Chronic kidney disease    Past Surgical History:  Past Surgical History  Procedure Laterality Date  . Mole removal    . Wisdom tooth extraction    . Esophagogastroduodenoscopy  11/01/2011    Procedure: ESOPHAGOGASTRODUODENOSCOPY (EGD);  Surgeon: Beryle Beams;  Location: [facility];  Service: Endoscopy;  Laterality: N/A;   HPI:  48 years old male with Schizoaffective disorder, resident of [place] home. Was in his usual state of health and sustained a fall with ?head trauma. He then had a generalized seizure. At arrival to the ED on acute renal failure, intubated for airway protection x2. Pt reports a h/o reflux.   Assessment / Plan / Recommendation Clinical Impression  Pt presents with overt s/s of aspiration with thin liquids s/p prolonged intubation x2. No overt coughing observed with nectar thick liquids or solid textures. Pt did exhibit multiple swallows with all consistencies, which SLP suspects may be related to NGT placement. After NGT removed by RN, pt consumed additional nectar thick liquids without overt s/s of aspiration and with less additional swallows per bolus. Recommend to initiate trial of Dys 2 textures to faciltiate mastication and bolus formation with nectar thick liquids. SLP to continue to follow for tolerance and advancement as able.    Aspiration Risk  Moderate    Diet Recommendation Dysphagia 2 (Fine chop);Nectar-thick liquid   Liquid Administration via: Cup;No straw Medication Administration: Whole meds with puree Supervision: Patient able to self feed;Full supervision/cueing for compensatory  strategies Compensations: Slow rate;Small sips/bites Postural Changes and/or Swallow Maneuvers: Seated upright 90 degrees;Upright 30-60 min after meal    Other  Recommendations Oral Care Recommendations: Oral care BID Other Recommendations: Order thickener from pharmacy;Prohibited food (jello, ice cream, thin soups);Remove water pitcher   Follow Up Recommendations  Inpatient Rehab;24 hour supervision/assistance    Frequency and Duration min 2x/week  2 weeks   Pertinent Vitals/Pain N/A    SLP Swallow Goals     Swallow Study Prior Functional Status       General Date of Onset: 01/02/14 HPI: 48 years old male with Schizoaffective disorder, resident of [place] home. Was in his usual state of health and sustained a fall with ?head trauma. He then had a generalized seizure. At arrival to the ED on acute renal failure, intubated for airway protection x2. Pt reports a h/o reflux. Type of Study: Bedside swallow evaluation Previous Swallow Assessment: none in chart Diet Prior to this Study: NPO;Panda Temperature Spikes Noted: No Respiratory Status: Room air History of Recent Intubation: Yes Length of Intubations (days): 3 days (pt with two recent intubations; first x7 days, second x3 day) Date extubated: 01/12/14 Behavior/Cognition: Alert;Cooperative;Pleasant mood;Requires cueing Oral Cavity - Dentition: Adequate natural dentition Self-Feeding Abilities: Able to feed self Patient Positioning: Upright in bed Baseline Vocal Quality: Clear;Other (comment) (pt reports low vocal intensity) Volitional Cough: Strong Volitional Swallow: Able to elicit    Oral/Motor/Sensory Function Overall Oral Motor/Sensory Function: Impaired Labial ROM: Within Functional Limits Labial Symmetry: Within Functional Limits Labial Strength: Within Functional Limits Lingual ROM: Within Functional Limits Lingual Symmetry: Within Functional Limits Lingual Strength: Reduced Facial ROM: Within Functional  Limits Facial Symmetry: Within Functional Limits Facial Strength: Within Functional Limits Velum: Within Functional Limits Mandible: Within Functional Limits   Ice Chips Ice chips: Not tested   Thin Liquid Thin Liquid: Impaired Presentation: Cup;Self Fed Pharyngeal  Phase Impairments: Suspected delayed Swallow;Multiple swallows;Cough - Immediate    Nectar Thick Nectar Thick Liquid: Impaired Presentation: Cup;Self Fed;Straw Pharyngeal Phase Impairments: Suspected delayed Swallow;Multiple swallows   Honey Thick Honey Thick Liquid: Not tested   Puree Puree: Impaired Presentation: Self Fed;Spoon Pharyngeal Phase Impairments: Suspected delayed Swallow;Multiple swallows   Solid   GO    Solid: Impaired Presentation: Self Fed Oral Phase Impairments: Impaired mastication Pharyngeal Phase Impairments: Suspected delayed Swallow;Multiple swallows        Brady Hoffman, M.A. CCC-SLP [phone]  Brady Hoffman 01/13/2014,2:08 PM

## 2014-01-13 NOTE — Progress Notes (Signed)
Name: Brady Hoffman MRN: [id number] DOB: [birth year]    ADMISSION DATE:  01/02/2014 CONSULTATION DATE: 01/02/14   PRIMARY SERVICE: PCCM   CHIEF COMPLAINT: Altered mental status, Seizure, Acute renal failure   BRIEF PATIENT DESCRIPTION:  48 years old male with Schizoaffective disorder, resident of [place] home. Was in his usual state of health and sustained a fall with ?head trauma. He then had a generalized seizure. At arrival to the ED on acute renal failure, intubated for airway protection.   SIGNIFICANT EVENTS / STUDIES:  2/1 CT abd/pelvis - unremarkable; LLL consolidation  2/1 CT Head - no acute intracranial abnormalities  2/1 CT C-spine - no acute cervical spine abnormalities  2/1>>> started on CRRT (through 2/3)  2/2 Renal u /s - No hydro  2/2 - Fomepizole started, EG back neg, dce'd  2/3 - EEG - No evidence of epileptiform discharges, No electrographic seizures noted. This is a normal asleep EEG. No electrographic seizures noted.  2/4- more agitated  2/5 - SCr worse  2/5 - too aggitated again for extubation  2/6 MRI >>> neg acute, premature atrophy  2/6 Extubated  2/8 Abdominal xray - NGT in proper place  2/9 EEG - normal sleep pattern. No evidence of significant encephalopathic process seen nor evidence of epileptic d/o  2/12 Extubated  LINES / TUBES:  LIJ 2/1>>>  R Femoral HD cath 2/1>>> 2/8  ETT 2/1>>> 2/6 2/9>>>2/11 OGT 2/1>>> 2/6  Foley 2/1>>> 2/5, 2/6>>>  NGT 2/8>>>   CULTURES:  MRSA negative  Blood 2/1 >>> neg  Urine 2/1>>> neg  Urine 2/2>>> neg   ANTIBIOTICS:  Zosyn 2/2>>> 2/3   SUBJECTIVE:  Extubated successfully yesterday, no stridor. Resting in bed comfortably in NAD. Waiting on SLP to start diet.   VITAL SIGNS: Temp:  [97.9 F (36.6 C)-98.6 F (37 C)] 98.4 F (36.9 C) (02/12 0410) Pulse Rate:  [94-113] 100 (02/12 0700) Resp:  [8-27] 9 (02/12 0700) BP: (122-164)/(73-93) 150/89 mmHg (02/12 0700) SpO2:  [100 %] 100 % (02/12  0700) FiO2 (%):  [30 %] 30 % (02/11 0900) Weight:  [90.7 kg (199 lb 15.3 oz)] 90.7 kg (199 lb 15.3 oz) (02/12 0437) HEMODYNAMICS:   VENTILATOR SETTINGS: Vent Mode:  [-] CPAP;PSV FiO2 (%):  [30 %] 30 % PEEP:  [5 cmH20] 5 cmH20 Pressure Support:  [5 cmH20] 5 cmH20 INTAKE / OUTPUT: Intake/Output     02/11 0701 - 02/12 0700 02/12 0701 - 02/13 0700   I.V. (mL/kg) 11105.8 (122.4)    NG/GT     IV Piggyback 260    Total Intake(mL/kg) 11365.8 (125.3)    Urine (mL/kg/hr) 10170 (4.7)    Total Output 10170     Net +1195.8            PHYSICAL EXAMINATION: General: Awake, alert, NAD. Follows commands.  Eyes: Anicteric sclerae, PERRL  ENT: MM moist, no JVD  Heart: Normal S1, S2. No murmurs, rubs, or gallops appreciated.  Lungs: CTAB, no wheezes noted  Abdomen: Soft, NT, ND, BS present No hepatosplenomegaly  Extremities: No LE edema  Skin: warm and dry  Neuro: follows commands  LABS:  CBC  Recent Labs Lab 01/12/14 0406 01/12/14 1100 01/13/14 0500  WBC 5.6 5.1 4.6  HGB 8.1* 8.4* 8.3*  HCT 24.0* 23.9* 23.9*  PLT 125* 112* 129*   Coag's No results found for this basename: APTT, INR,  in the last 168 hours BMET  Recent Labs Lab 01/11/14 0500 01/12/14 0406 01/13/14 0500  NA  149* 144 145  K 3.9 3.9 3.9  CL 108 105 107  CO2 24 21 18*  BUN 42* 34* 30*  CREATININE 5.00* 4.15* 3.33*  GLUCOSE 119* 88 75   Electrolytes  Recent Labs Lab 01/10/14 0329 01/11/14 0500 01/12/14 0406 01/13/14 0500  CALCIUM  --  8.0* 7.7* 8.0*  MG 2.1  --  1.6 1.8  PHOS  --   --  4.8* 4.5   Sepsis Markers  Recent Labs Lab 01/09/14 1536  LATICACIDVEN 0.6   ABG  Recent Labs Lab 01/11/14 0408 01/12/14 0500 01/13/14 0344  PHART 7.513* 7.365 7.320*  PCO2ART 33.6* 40.0 39.1  PO2ART 130.0* 138.0* 116.0*   Liver Enzymes  Recent Labs Lab 01/12/14 0406 01/13/14 0500  AST  --  10  ALT  --  13  ALKPHOS  --  90  BILITOT  --  0.3  ALBUMIN 2.3* 2.5*   Cardiac Enzymes  Recent  Labs Lab 01/10/14 0328 01/10/14 1502  TROPONINI <0.30 <0.30   Glucose  Recent Labs Lab 01/12/14 0815 01/12/14 1237 01/12/14 1529 01/12/14 1916 01/13/14 0002 01/13/14 0409  GLUCAP 90 85 82 72 80 76    Imaging Dg Chest Port 1 View  01/13/2014   CLINICAL DATA:  Congestion.  EXAM: PORTABLE CHEST - 1 VIEW  COMPARISON:  Single view of the chest 01/12/2014 and 01/11/2014.  FINDINGS: Endotracheal tube has been removed. Left IJ catheter and NG tube remain in place. Lungs are clear. Heart size is normal. No pneumothorax or pleural effusion.  IMPRESSION: Status post extubation.  Lungs are clear.   Electronically Signed   By: Inge Rise M.D.   On: 01/13/2014 07:43   Dg Chest Port 1 View  01/12/2014   CLINICAL DATA:  Followup effusion, shortness of breath.  EXAM: PORTABLE CHEST - 1 VIEW  COMPARISON:  01/11/2014  FINDINGS: Endotracheal tube with tip 5.6 cm above the carina. Left IJ central venous catheter with tip obliquely oriented over the region of the SVC unchanged. Enteric tube courses into the region of the stomach and off the inferior portion of the film as tip is not visualized. Lungs are adequately inflated with minimal left retrocardiac opacification which may be due to mild atelectasis/effusion. There is mild linear atelectasis in the left midlung. Cardiomediastinal silhouette and remainder of the exam is unchanged.  IMPRESSION: Minimal left retrocardiac opacification unchanged and may be due to atelectasis/effusion. Cannot completely exclude infection. Linear atelectasis left midlung.  Tubes and lines as described.   Electronically Signed   By: Marin Olp M.D.   On: 01/12/2014 07:28    CXR: ETT removed, lung fields clear. No further CXRs  ASSESSMENT / PLAN:   PULMONARY  A:  ?Aspiration pneumonia --CXR clear Acute resp failure - resolved  Extubated 2/11 P:   - IS and flutter valve. - Continue to monitor off abx.   CARDIOVASCULAR  A:  Afib/flutter with RVR - no known hx  --RESOLVED HTN (no known hx)  Hyperlipidemia  P:  - Continue aspirin.  - Hold statin.  - Recheck CK,Total, if normal will restart statin tomorrow  RENAL  A:  ARF of unclear etiology (no known history) - SCr improving slowly. Family/group home owner at bedside say that during weeks leading up to this event he has been more unstable from psych standpoint, locking himself in bathroom, poor po intake, ?ethylene glycol ingestion (was neg on admit)  AG metabolic acidosis  Hypokalemia --resolved  Methanol Neg  Ethylene glycol Neg, clinical circumstances  matches  Hypernatremia -- corrected  P:  - Nephrology has signed off, SCr continuously downtrending  - F/u BMET in am.  - No diuresis for now. - Trial off IVF today to see if ATN/diuresis has improved, if UOP stays >1cc/kg/hr > 6 hrs will need to resume matching UOP with 1/2 NS. - Match UOP with 1/2 NS + 50 on an hourly basis (see above).  GASTROINTESTINAL  A:  GERD Last BM 2/10 P:  - Continue Protonix (on PPI at home). - Swallow evaluation again today.  HEMATOLOGIC  A:  Anemia - stable  DVT ppx  P:  - SQH.   INFECTIOUS  A:  Afebrile No leukocytosis  P:  - All cultures negative to date.  - Trend fever curve off abx.   ENDOCRINE  A:  Hyperglycemia - controlled  Acetone, blood - moderate  P:  - Continue Novolog SSI per ICU protocol.  - Follow CBG.   NEUROLOGIC  A:  Schizoaffective disorder  Seizure s/p fall with head trauma  AMS (post ictal vs uremia vs drug OD / toxin vs home regimen SE)  R/o EG tox, suicide OD?  EEG and MRI - neg  Ethylene glycol <5 (NEG)  UDS neg  2/9 repeat EEG neg  P:  - Neurology following, appreciate recs. - Continue Keppra, can d/c as outpt per neuro. Change to PO today. - Would consider dantrolene challenge if rigidity noted again or high fevers (none to date).  - Clozapine 100mg  QAM and 1pm, 400mg  QHS for psychosis. - Cogentin 2mg  QHS for EPS. - Effexor 150mg  daily for  depression. - Appreciate psych recs.  TODAY'S SUMMARY: CIR recommended by PT, transfer out of ICU today to tele and to [facility], PCCM will sign off please call back if needed. Resume diet after seen by SLP.  I have personally obtained a history, examined the patient, evaluated laboratory and imaging results, formulated the assessment and plan and placed orders.  [name], M.D. [facility] Pulmonary/Critical Care Medicine. Pager: [phone]. After hours pager: [phone].

## 2014-01-14 ENCOUNTER — Encounter (HOSPITAL_COMMUNITY): Payer: Self-pay | Admitting: Internal Medicine

## 2014-01-14 DIAGNOSIS — D649 Anemia, unspecified: Secondary | ICD-10-CM

## 2014-01-14 HISTORY — DX: Anemia, unspecified: D64.9

## 2014-01-14 LAB — CBC
HCT: 24.6 % — ABNORMAL LOW (ref 39.0–52.0)
HEMOGLOBIN: 8.6 g/dL — AB (ref 13.0–17.0)
MCH: 31 pg (ref 26.0–34.0)
MCHC: 35 g/dL (ref 30.0–36.0)
MCV: 88.8 fL (ref 78.0–100.0)
Platelets: 168 10*3/uL (ref 150–400)
RBC: 2.77 MIL/uL — ABNORMAL LOW (ref 4.22–5.81)
RDW: 12.1 % (ref 11.5–15.5)
WBC: 4.4 10*3/uL (ref 4.0–10.5)

## 2014-01-14 LAB — BASIC METABOLIC PANEL
BUN: 30 mg/dL — ABNORMAL HIGH (ref 6–23)
CHLORIDE: 110 meq/L (ref 96–112)
CO2: 24 mEq/L (ref 19–32)
Calcium: 8.4 mg/dL (ref 8.4–10.5)
Creatinine, Ser: 2.92 mg/dL — ABNORMAL HIGH (ref 0.50–1.35)
GFR calc Af Amer: 28 mL/min — ABNORMAL LOW (ref >90)
GFR calc non Af Amer: 24 mL/min — ABNORMAL LOW (ref >90)
Glucose, Bld: 99 mg/dL (ref 70–99)
POTASSIUM: 3.7 meq/L (ref 3.7–5.3)
SODIUM: 147 meq/L (ref 137–147)

## 2014-01-14 LAB — GLUCOSE, CAPILLARY: GLUCOSE-CAPILLARY: 127 mg/dL — AB (ref 70–99)

## 2014-01-14 LAB — CK: CK TOTAL: 114 U/L (ref 7–232)

## 2014-01-14 MED ORDER — PANTOPRAZOLE SODIUM 40 MG PO TBEC
40.0000 mg | DELAYED_RELEASE_TABLET | Freq: Every day | ORAL | Status: DC
  Administered 2014-01-14 – 2014-01-16 (×3): 40 mg via ORAL
  Filled 2014-01-14 (×3): qty 1

## 2014-01-14 MED ORDER — SODIUM CHLORIDE 0.9 % IJ SOLN
10.0000 mL | INTRAMUSCULAR | Status: DC | PRN
  Administered 2014-01-14: 30 mL

## 2014-01-14 MED ORDER — SODIUM CHLORIDE 0.9 % IJ SOLN: 10.0000 mL | Freq: Two times a day (BID) | INTRAMUSCULAR | Status: DC

## 2014-01-14 MED ORDER — FOLIC ACID 1 MG PO TABS
1.0000 mg | ORAL_TABLET | Freq: Every day | ORAL | Status: DC
  Administered 2014-01-14 – 2014-01-17 (×4): 1 mg via ORAL
  Filled 2014-01-14 (×4): qty 1

## 2014-01-14 MED ORDER — ATORVASTATIN CALCIUM 20 MG PO TABS
20.0000 mg | ORAL_TABLET | Freq: Every day | ORAL | Status: DC
  Administered 2014-01-14 – 2014-01-16 (×3): 20 mg via ORAL
  Filled 2014-01-14 (×4): qty 1

## 2014-01-14 NOTE — Progress Notes (Addendum)
PROGRESS NOTE  Brady Hoffman CWC:[id number] DOB: [birth year] DOA: 01/02/2014 PCP: No primary provider on file.  BRIEF PATIENT DESCRIPTION:  48 years old male with Schizoaffective disorder, resident of [facility]. Was in his usual state of health and sustained a fall with ?head trauma. He then had a generalized seizure. At arrival to the ED on acute renal failure, intubated for airway protection.   Assessment/Plan:  ?Aspiration pneumonia --CXR clear  Acute resp failure - resolved  Extubated 2/11  P:  - IS and flutter valve.  - Continue to monitor off abx.    Afib/flutter with RVR - no known hx --RESOLVED  HTN (no known hx)  Hyperlipidemia  - Continue aspirin.  - Hold statin.    ARF of unclear etiology (no known history) - SCr improving slowly. Family/group home owner at bedside say that during weeks leading up to this event he has been more unstable from psych standpoint, locking himself in bathroom, poor po intake, ?ethylene glycol ingestion (was neg on admit)  AG metabolic acidosis  Hypokalemia --resolved  Methanol Neg  Ethylene glycol Neg, clinical circumstances matches  Hypernatremia -- corrected  - Nephrology has signed off, SCr continuously downtrending  - F/u BMET in am.  - No diuresis for now.  - Trial off IVF again today to see if ATN/diuresis has improved, if UOP stays >1cc/kg/hr > 6 hrs will need to resume matching UOP with 1/2 NS.  - Match UOP with 1/2 NS + 50 on an hourly basis - will need to d/c foley soon  Anemia - stable  DVT ppx  P:  - SQH.   Afebrile  No leukocytosis  P:  - All cultures negative to date.  - Trend fever curve off abx.   Hyperglycemia - controlled  Acetone, blood - moderate  - Continue Novolog SSI per ICU protocol.  - Follow CBG.   Schizoaffective disorder  Seizure s/p fall with head trauma  AMS (post ictal vs uremia vs drug OD / toxin vs home regimen SE)  R/o EG tox, suicide OD?  EEG and MRI - neg  Ethylene  glycol <5 (NEG)  UDS neg  2/9 repeat EEG neg  - Neurology following, appreciate recs.  - Continue Keppra, can d/c as outpt per neuro. Change to PO today.  - Would consider dantrolene challenge if rigidity noted again or high fevers (none to date).  - Clozapine 100mg  QAM and 1pm, 400mg  QHS for psychosis.  - Cogentin 2mg  QHS for EPS.  - Effexor 150mg  daily for depression.  - Appreciate psych recs.   Code Status: full Family Communication: patient Disposition Plan: CIR???   SIGNIFICANT EVENTS / STUDIES:  2/1 CT abd/pelvis - unremarkable; LLL consolidation  2/1 CT Head - no acute intracranial abnormalities  2/1 CT C-spine - no acute cervical spine abnormalities  2/1>>> started on CRRT (through 2/3)  2/2 Renal u /s - No hydro  2/2 - Fomepizole started, EG back neg, dce'd  2/3 - EEG - No evidence of epileptiform discharges, No electrographic seizures noted. This is a normal asleep EEG. No electrographic seizures noted.  2/4- more agitated  2/5 - SCr worse  2/5 - too aggitated again for extubation  2/6 MRI >>> neg acute, premature atrophy  2/6 Extubated  2/8 Abdominal xray - NGT in proper place  2/9 EEG - normal sleep pattern. No evidence of significant encephalopathic process seen nor evidence of epileptic d/o  2/12 Extubated   LINES / TUBES:  LIJ 2/1>>>  R Femoral HD cath 2/1>>> 2/8  ETT 2/1>>> 2/6 2/9>>>2/11  OGT 2/1>>> 2/6  Foley 2/1>>> 2/5, 2/6>>>  NGT 2/8>>>   CULTURES:  MRSA negative  Blood 2/1 >>> neg  Urine 2/1>>> neg  Urine 2/2>>> neg   ANTIBIOTICS:  Zosyn 2/2>>> 2/3    HPI/Subjective: No new c/o  Objective: Filed Vitals:   01/14/14 1024  BP: 126/87  Pulse: 101  Temp: 99.2 F (37.3 C)  Resp: 10    Intake/Output Summary (Last 24 hours) at 01/14/14 1110 Last data filed at 01/14/14 1023  Gross per 24 hour  Intake    750 ml  Output   4500 ml  Net  -3750 ml   Filed Weights   01/12/14 0500 01/13/14 0437 01/13/14 2048  Weight: 92.5 kg (203 lb 14.8  oz) 90.7 kg (199 lb 15.3 oz) 87.8 kg (193 lb 9 oz)    Exam:   General:  Awake- answers questions  Cardiovascular: rrr  Respiratory: clear anterior  Abdomen: +BS, soft  Musculoskeletal: moves all 4 ext   Data Reviewed: Basic Metabolic Panel:  Recent Labs Lab 01/10/14 0328 01/10/14 0329 01/11/14 0500 01/12/14 0406 01/13/14 0500 01/14/14 0553  NA 150*  --  149* 144 145 147  K 4.2  --  3.9 3.9 3.9 3.7  CL 111  --  108 105 107 110  CO2 19  --  24 21 18* 24  GLUCOSE 123*  --  119* 88 75 99  BUN 49*  --  42* 34* 30* 30*  CREATININE 6.00*  --  5.00* 4.15* 3.33* 2.92*  CALCIUM 8.4  --  8.0* 7.7* 8.0* 8.4  MG  --  2.1  --  1.6 1.8  --   PHOS  --   --   --  4.8* 4.5  --    Liver Function Tests:  Recent Labs Lab 01/12/14 0406 01/13/14 0500  AST  --  10  ALT  --  13  ALKPHOS  --  90  BILITOT  --  0.3  PROT  --  5.7*  ALBUMIN 2.3* 2.5*   No results found for this basename: LIPASE, AMYLASE,  in the last 168 hours No results found for this basename: AMMONIA,  in the last 168 hours CBC:  Recent Labs Lab 01/09/14 0400 01/12/14 0406 01/12/14 1100 01/13/14 0500 01/14/14 0553  WBC 9.8 5.6 5.1 4.6 4.4  NEUTROABS  --  3.6  --   --   --   HGB 9.4* 8.1* 8.4* 8.3* 8.6*  HCT 27.9* 24.0* 23.9* 23.9* 24.6*  MCV 89.7 90.9 90.2 89.5 88.8  PLT 165 125* 112* 129* 168   Cardiac Enzymes:  Recent Labs Lab 01/09/14 1600 01/10/14 0328 01/10/14 1502 01/14/14 0553  CKTOTAL 660*  --   --  114  TROPONINI  --  <0.30 <0.30  --    BNP (last 3 results) No results found for this basename: PROBNP,  in the last 8760 hours CBG:  Recent Labs Lab 01/12/14 1916 01/13/14 0002 01/13/14 0409 01/13/14 0814 01/13/14 1146  GLUCAP 72 80 76 77 85    No results found for this or any previous visit (from the past 240 hour(s)).   Studies: Dg Chest Port 1 View  01/13/2014   CLINICAL DATA:  Congestion.  EXAM: PORTABLE CHEST - 1 VIEW  COMPARISON:  Single view of the chest 01/12/2014  and 01/11/2014.  FINDINGS: Endotracheal tube has been removed. Left IJ catheter and NG tube remain in place. Lungs  are clear. Heart size is normal. No pneumothorax or pleural effusion.  IMPRESSION: Status post extubation.  Lungs are clear.   Electronically Signed   By: Inge Rise M.D.   On: 01/13/2014 07:43    Scheduled Meds: . aspirin  81 mg Oral Daily  . benztropine  2 mg Oral QHS  . cloZAPine  100 mg Oral 2 times per day  . cloZAPine  400 mg Oral QHS  . folic acid  1 mg Oral Daily  . heparin  5,000 Units Subcutaneous 3 times per day  . levETIRAcetam  500 mg Oral BID  . pantoprazole  40 mg Oral QHS  . sodium chloride  10-40 mL Intracatheter Q12H  . venlafaxine  75 mg Oral BID WC   Continuous Infusions:   Principal Problem:   Status epilepticus Active Problems:   Acute renal failure   Altered mental status   Metabolic acidosis   Acute respiratory failure    Time spent: 35 min    Scotlynn Noyes  [facility] Pager [phone]. If 7PM-7AM, please contact night-coverage at [email or web address], password [facility] 01/14/2014, 11:10 AM  LOS: 12 days

## 2014-01-14 NOTE — Progress Notes (Signed)
Physical Therapy Treatment Patient Details Name: Brady Hoffman MRN: [id number] DOB: 1965-12-11 Today's Date: 01/14/2014 Time: 1430-1500 PT Time Calculation (min): 30 min  PT Assessment / Plan / Recommendation  History of Present Illness pt presents post Seizure and Fall with Encephalopathy.     PT Comments   Patient much improved with mobility and activity tolerance.  Still needing fair amount of assist for safety due to weakness and imbalance.  Will continue to benefit from acute skilled PT and follow up CIR level therapies prior to d/c back to group home.  Follow Up Recommendations  CIR     Does the patient have the potential to tolerate intense rehabilitation   yes  Barriers to Discharge  None      Equipment Recommendations  None recommended by PT    Recommendations for Other Services OT consult;Rehab consult  Frequency Min 3X/week   Progress towards PT Goals Progress towards PT goals: Progressing toward goals  Plan Current plan remains appropriate    Precautions / Restrictions Precautions Precautions: Fall Precaution Comments: Seizure Precautions   Pertinent Vitals/Pain Denies pain    Mobility  Bed Mobility Bed Mobility: Supine to Sit Supine to sit: Min guard General bed mobility comments: pulls up on bedrails, increased effort to come to sit Transfers Overall transfer level: Needs assistance Equipment used: Rolling walker (2 wheeled) Transfers: Sit to/from Stand Sit to Stand: Mod assist;+2 safety/equipment General transfer comment: posterior bias initially and c/o dizziness stood several seconds prior to attempting to move Ambulation/Gait Ambulation/Gait assistance: Mod assist Ambulation Distance (Feet): 25 Feet (and 15') Assistive device: Rolling walker (2 wheeled) Gait Pattern/deviations: Step-through pattern;Shuffle;Decreased stride length;Trunk flexed;Scissoring General Gait Details: assist to maneuver walker, for balance, cues for direction, postural  control    Exercises General Exercises - Upper Extremity Shoulder Flexion: AROM;Both;5 reps;Seated General Exercises - Lower Extremity Hip Flexion/Marching: AROM;Both;10 reps;Seated Toe Raises: AROM;Both;10 reps;Seated Heel Raises: AROM;Both;10 reps;Seated     PT Goals (current goals can now be found in the care plan section)    Visit Information  Last PT Received On: 01/14/14 Assistance Needed: +2 History of Present Illness: pt presents post Seizure and Fall with Encephalopathy.      Subjective Data      Cognition  Cognition Arousal/Alertness: Awake/alert Behavior During Therapy: WFL for tasks assessed/performed Overall Cognitive Status: No family/caregiver present to determine baseline cognitive functioning    Balance  Balance Overall balance assessment: Needs assistance Sitting-balance support: Feet supported Sitting balance-Leahy Scale: Fair Standing balance-Leahy Scale: Poor Standing balance comment: UE support needed for safety/balance  End of Session PT - End of Session Equipment Utilized During Treatment: Gait belt Activity Tolerance: Patient limited by fatigue Patient left: in chair;with call bell/phone within reach;with chair alarm set   GP     [facility] 01/14/2014, 4:47 PM Magda Kiel, [place] 01/14/2014

## 2014-01-14 NOTE — Progress Notes (Signed)
Rehab admissions - Evaluated for possible admission.  I spoke with patient's mother.  She is agreeable to inpatient rehab prior to return to group home.  She feels group home can provide supervision after rehab stay.  Not medically ready today for rehab.  Will check back on Monday to see if he is medically ready.  Call me for questions.  #[phone]

## 2014-01-14 NOTE — Progress Notes (Signed)
Speech Language Pathology Treatment: Dysphagia  Patient Details Name: Brady Hoffman MRN: [id number] DOB: 04/11/1966 Today's Date: 01/14/2014 Time: 0865-7846 SLP Time Calculation (min): 15 min  Assessment / Plan / Recommendation Clinical Impression  Pt seen for f/u dysphagia treatment with trials of Dys 3 textures, nectar thick liquids, and thin liquids. Pt continues to present with prolonged mastication and bolus formation with soft solids, and with immediate, overt coughing with thin liquids. He requires Max cues for small bites and sips. Recommend to continue with current diet with additional trials at bedside to determine readiness for advancement.   HPI HPI: 48 years old male with Schizoaffective disorder, resident of [facility] home. Was in his usual state of health and sustained a fall with ?head trauma. He then had a generalized seizure. At arrival to the ED on acute renal failure, intubated for airway protection x2. Pt reports a h/o reflux.   Pertinent Vitals N/A  SLP Plan  Continue with current plan of care    Recommendations Diet recommendations: Dysphagia 2 (fine chop);Nectar-thick liquid Liquids provided via: Cup;No straw Medication Administration: Whole meds with puree Supervision: Patient able to self feed;Full supervision/cueing for compensatory strategies Compensations: Slow rate;Small sips/bites Postural Changes and/or Swallow Maneuvers: Seated upright 90 degrees;Upright 30-60 min after meal              Oral Care Recommendations: Oral care BID Follow up Recommendations: Inpatient Rehab;24 hour supervision/assistance Plan: Continue with current plan of care    GO       Germain Osgood, M.A. CCC-SLP [phone]  Germain Osgood 01/14/2014, 4:35 PM

## 2014-01-15 LAB — BASIC METABOLIC PANEL
BUN: 28 mg/dL — ABNORMAL HIGH (ref 6–23)
CO2: 23 mEq/L (ref 19–32)
Calcium: 8.4 mg/dL (ref 8.4–10.5)
Chloride: 108 mEq/L (ref 96–112)
Creatinine, Ser: 2.62 mg/dL — ABNORMAL HIGH (ref 0.50–1.35)
GFR calc Af Amer: 32 mL/min — ABNORMAL LOW (ref >90)
GFR, EST NON AFRICAN AMERICAN: 27 mL/min — AB (ref >90)
GLUCOSE: 106 mg/dL — AB (ref 70–99)
POTASSIUM: 4.2 meq/L (ref 3.7–5.3)
SODIUM: 146 meq/L (ref 137–147)

## 2014-01-15 LAB — CBC
HCT: 25.3 % — ABNORMAL LOW (ref 39.0–52.0)
HEMOGLOBIN: 8.6 g/dL — AB (ref 13.0–17.0)
MCH: 30.5 pg (ref 26.0–34.0)
MCHC: 34 g/dL (ref 30.0–36.0)
MCV: 89.7 fL (ref 78.0–100.0)
Platelets: 205 10*3/uL (ref 150–400)
RBC: 2.82 MIL/uL — AB (ref 4.22–5.81)
RDW: 12.2 % (ref 11.5–15.5)
WBC: 4.4 10*3/uL (ref 4.0–10.5)

## 2014-01-15 NOTE — Progress Notes (Signed)
PROGRESS NOTE  Brady Hoffman WCH:[id number] DOB: 10-22-1966 DOA: 01/02/2014 PCP: No primary provider on file.  BRIEF PATIENT DESCRIPTION:  48 years old male with Schizoaffective disorder, resident of [facility] nursing [facility]. Was in his usual state of health and sustained a fall with ?head trauma. He then had a generalized seizure. At arrival to the ED on acute renal failure, intubated for airway protection.   Assessment/Plan:  ?Aspiration pneumonia --CXR clear  Acute resp failure - resolved  Extubated 2/11  - IS and flutter valve.  - Continue to monitor off abx.    Afib/flutter with RVR - no known hx --RESOLVED  HTN (no known hx)  Hyperlipidemia  - Continue aspirin.  - Hold statin.    ARF of unclear etiology (no known history) - SCr improving slowly. Family/group home owner say that during weeks leading up to this event he has been more unstable from psych standpoint, locking himself in bathroom, poor po intake AG metabolic acidosis  Hypokalemia --resolved  Hypernatremia -- corrected  - Nephrology has signed off, SCr continuously downtrending  - F/u BMET daily - No diuresis for now.  - Trial off IVF -Cr decreasing- plan CIR on Monday with BMPs peridoically  Anemia - stable  DVT ppx  P:  - SQH.   Afebrile  No leukocytosis  P:  - All cultures negative to date.  - Trend fever curve off abx.   Hyperglycemia - controlled  Acetone, blood - moderate  - Continue Novolog SSI per ICU protocol.  - Follow CBG.   Schizoaffective disorder  Seizure s/p fall with head trauma  AMS (post ictal vs uremia vs drug OD / toxin vs home regimen SE)  EEG and MRI - neg  2/9 repeat EEG neg  - Neurology following, appreciate recs.  - Continue Keppra, can d/c as outpt per neuro - Would consider dantrolene challenge if rigidity noted again or high fevers (none to date).  - Clozapine 100mg  QAM and 1pm, 400mg  QHS for psychosis.  - Cogentin 2mg  QHS for EPS.  - Effexor 150mg  daily for  depression.  - Appreciate psych recs.   Code Status: full Family Communication: patient Disposition Plan: CIR on Monday   SIGNIFICANT EVENTS / STUDIES:  2/1 CT abd/pelvis - unremarkable; LLL consolidation  2/1 CT Head - no acute intracranial abnormalities  2/1 CT C-spine - no acute cervical spine abnormalities  2/1>>> started on CRRT (through 2/3)  2/2 Renal u /s - No hydro  2/2 - Fomepizole started, EG back neg, dce'd  2/3 - EEG - No evidence of epileptiform discharges, No electrographic seizures noted. This is a normal asleep EEG. No electrographic seizures noted.  2/4- more agitated  2/5 - SCr worse  2/5 - too aggitated again for extubation  2/6 MRI >>> neg acute, premature atrophy  2/6 Extubated  2/8 Abdominal xray - NGT in proper place  2/9 EEG - normal sleep pattern. No evidence of significant encephalopathic process seen nor evidence of epileptic d/o  2/12 Extubated   LINES / TUBES:  LIJ 2/1>>>  R Femoral HD cath 2/1>>> 2/8  ETT 2/1>>> 2/6 2/9>>>2/11  OGT 2/1>>> 2/6  Foley 2/1>>> 2/5, 2/6>>>  NGT 2/8>>>   CULTURES:  MRSA negative  Blood 2/1 >>> neg  Urine 2/1>>> neg  Urine 2/2>>> neg   ANTIBIOTICS:  Zosyn 2/2>>> 2/3    HPI/Subjective: Feeling well, no c/o Wants breakfast  Objective: Filed Vitals:   01/15/14 0526  BP: 113/76  Pulse: 101  Temp: 99  F (37.2 C)  Resp: 10    Intake/Output Summary (Last 24 hours) at 01/15/14 0818 Last data filed at 01/15/14 0258  Gross per 24 hour  Intake   1440 ml  Output   3375 ml  Net  -1935 ml   Filed Weights   01/13/14 2048 01/14/14 2111 01/15/14 0500  Weight: 87.8 kg (193 lb 9 oz) 87.799 kg (193 lb 9 oz) 85.446 kg (188 lb 6 oz)    Exam:   General:  Awake- answers questions  Cardiovascular: rrr  Respiratory: clear anterior  Abdomen: +BS, soft  Musculoskeletal: moves all 4 ext   Data Reviewed: Basic Metabolic Panel:  Recent Labs Lab 01/10/14 0329 01/11/14 0500 01/12/14 0406 01/13/14 0500  01/14/14 0553 01/15/14 0320  NA  --  149* 144 145 147 146  K  --  3.9 3.9 3.9 3.7 4.2  CL  --  108 105 107 110 108  CO2  --  24 21 18* 24 23  GLUCOSE  --  119* 88 75 99 106*  BUN  --  42* 34* 30* 30* 28*  CREATININE  --  5.00* 4.15* 3.33* 2.92* 2.62*  CALCIUM  --  8.0* 7.7* 8.0* 8.4 8.4  MG 2.1  --  1.6 1.8  --   --   PHOS  --   --  4.8* 4.5  --   --    Liver Function Tests:  Recent Labs Lab 01/12/14 0406 01/13/14 0500  AST  --  10  ALT  --  13  ALKPHOS  --  90  BILITOT  --  0.3  PROT  --  5.7*  ALBUMIN 2.3* 2.5*   No results found for this basename: LIPASE, AMYLASE,  in the last 168 hours No results found for this basename: AMMONIA,  in the last 168 hours CBC:  Recent Labs Lab 01/12/14 0406 01/12/14 1100 01/13/14 0500 01/14/14 0553 01/15/14 0320  WBC 5.6 5.1 4.6 4.4 4.4  NEUTROABS 3.6  --   --   --   --   HGB 8.1* 8.4* 8.3* 8.6* 8.6*  HCT 24.0* 23.9* 23.9* 24.6* 25.3*  MCV 90.9 90.2 89.5 88.8 89.7  PLT 125* 112* 129* 168 205   Cardiac Enzymes:  Recent Labs Lab 01/09/14 1600 01/10/14 0328 01/10/14 1502 01/14/14 0553  CKTOTAL 660*  --   --  114  TROPONINI  --  <0.30 <0.30  --    BNP (last 3 results) No results found for this basename: PROBNP,  in the last 8760 hours CBG:  Recent Labs Lab 01/13/14 0002 01/13/14 0409 01/13/14 0814 01/13/14 1146 01/14/14 2114  GLUCAP 80 76 77 85 127*    No results found for this or any previous visit (from the past 240 hour(s)).   Studies: No results found.  Scheduled Meds: . aspirin  81 mg Oral Daily  . atorvastatin  20 mg Oral QHS  . benztropine  2 mg Oral QHS  . cloZAPine  100 mg Oral 2 times per day  . cloZAPine  400 mg Oral QHS  . folic acid  1 mg Oral Daily  . heparin  5,000 Units Subcutaneous 3 times per day  . levETIRAcetam  500 mg Oral BID  . pantoprazole  40 mg Oral QHS  . sodium chloride  10-40 mL Intracatheter Q12H  . venlafaxine  75 mg Oral BID WC   Continuous Infusions:   Principal  Problem:   Status epilepticus Active Problems:   Acute renal failure  Altered mental status   Metabolic acidosis   Acute respiratory failure   Anemia    Time spent: 25 min    VANN, JESSICA  [facility] Pager [phone]. If 7PM-7AM, please contact night-coverage at [email or web address], password [facility] 01/15/2014, 8:18 AM  LOS: 13 days

## 2014-01-16 DIAGNOSIS — D649 Anemia, unspecified: Secondary | ICD-10-CM

## 2014-01-16 LAB — BASIC METABOLIC PANEL
BUN: 27 mg/dL — ABNORMAL HIGH (ref 6–23)
CHLORIDE: 106 meq/L (ref 96–112)
CO2: 22 meq/L (ref 19–32)
Calcium: 8.8 mg/dL (ref 8.4–10.5)
Creatinine, Ser: 2.75 mg/dL — ABNORMAL HIGH (ref 0.50–1.35)
GFR calc Af Amer: 30 mL/min — ABNORMAL LOW (ref >90)
GFR calc non Af Amer: 26 mL/min — ABNORMAL LOW (ref >90)
Glucose, Bld: 106 mg/dL — ABNORMAL HIGH (ref 70–99)
POTASSIUM: 4.3 meq/L (ref 3.7–5.3)
SODIUM: 144 meq/L (ref 137–147)

## 2014-01-16 LAB — GLUCOSE, CAPILLARY: Glucose-Capillary: 105 mg/dL — ABNORMAL HIGH (ref 70–99)

## 2014-01-16 MED ORDER — SODIUM CHLORIDE 0.9 % IV SOLN
INTRAVENOUS | Status: DC
  Administered 2014-01-16 – 2014-01-17 (×2): via INTRAVENOUS

## 2014-01-16 NOTE — Plan of Care (Signed)
Problem: Phase I Progression Outcomes Goal: Pt./family verbalizes plan of care Outcome: Completed/Met Date Met:  01/16/14 Mother and aunt met with MD today

## 2014-01-16 NOTE — Progress Notes (Signed)
PROGRESS NOTE  Brady Hoffman [id number] DOB: 28-Jul-1966 DOA: 01/02/2014 PCP: No primary provider on file.  BRIEF PATIENT DESCRIPTION:  48 years old male with Schizoaffective disorder, resident of [facility] nursing home. Was in his usual state of health and sustained a fall with ?head trauma. He then had a generalized seizure. At arrival to the ED on acute renal failure, intubated for airway protection.   Assessment/Plan:  ?Aspiration pneumonia --CXR clear  Acute resp failure - resolved  Extubated 2/11  - IS and flutter valve.  - Continue to monitor off abx.    Afib/flutter with RVR - no known hx --RESOLVED  HTN (no known hx)  Hyperlipidemia  - Continue aspirin.  - Hold statin.    ARF of unclear etiology (no known history) - SCr improving slowly. Family/group home owner say that during weeks leading up to this event he has been more unstable from psych standpoint, locking himself in bathroom, poor po intake AG metabolic acidosis  Hypokalemia --resolved  Hypernatremia -- corrected  - Nephrology has signed off, SCr continuously downtrending  - F/u BMET daily - No diuresis for now.  - Cr was decreasing until today- plan CIR on Monday with BMPs periodically -give gentle IVF today and recheck in AM  Anemia - stable  DVT ppx  - SQH.   Afebrile  No leukocytosis  - All cultures negative to date.  - Trend fever curve off abx.    Schizoaffective disorder  Seizure s/p fall with head trauma  AMS (post ictal vs uremia vs drug OD / toxin vs home regimen SE)  EEG and MRI - neg  2/9 repeat EEG neg  - Neurology following, appreciate recs.  - Continue Keppra, can d/c as outpt per neuro - Would consider dantrolene challenge if rigidity noted again or high fevers (none to date).  - Clozapine 100mg  QAM and 1pm, 400mg  QHS for psychosis.  - Cogentin 2mg  QHS for EPS.  - Effexor 150mg  daily for depression.  - Appreciate psych recs.   Code Status: full Family Communication:  patient Disposition Plan: CIR on Monday   SIGNIFICANT EVENTS / STUDIES:  2/1 CT abd/pelvis - unremarkable; LLL consolidation  2/1 CT Head - no acute intracranial abnormalities  2/1 CT C-spine - no acute cervical spine abnormalities  2/1>>> started on CRRT (through 2/3)  2/2 Renal u /s - No hydro  2/2 - Fomepizole started, EG back neg, dce'd  2/3 - EEG - No evidence of epileptiform discharges, No electrographic seizures noted. This is a normal asleep EEG. No electrographic seizures noted.  2/4- more agitated  2/5 - SCr worse  2/5 - too aggitated again for extubation  2/6 MRI >>> neg acute, premature atrophy  2/6 Extubated  2/8 Abdominal xray - NGT in proper place  2/9 EEG - normal sleep pattern. No evidence of significant encephalopathic process seen nor evidence of epileptic d/o  2/12 Extubated   LINES / TUBES:  LIJ 2/1>>>  R Femoral HD cath 2/1>>> 2/8  ETT 2/1>>> 2/6 2/9>>>2/11  OGT 2/1>>> 2/6  Foley 2/1>>> 2/5, 2/6>>>  NGT 2/8>>>   CULTURES:  MRSA negative  Blood 2/1 >>> neg  Urine 2/1>>> neg  Urine 2/2>>> neg   ANTIBIOTICS:  Zosyn 2/2>>> 2/3    HPI/Subjective: Lots of urine output No CP, no SOB  Objective: Filed Vitals:   01/16/14 0500  BP: 115/77  Pulse: 107  Temp: 98.5 F (36.9 C)  Resp: 18    Intake/Output Summary (Last 24 hours) at  01/16/14 1008 Last data filed at 01/16/14 0700  Gross per 24 hour  Intake    720 ml  Output      0 ml  Net    720 ml   Filed Weights   01/14/14 2111 01/15/14 0500 01/15/14 2100  Weight: 87.799 kg (193 lb 9 oz) 85.446 kg (188 lb 6 oz) 85.7 kg (188 lb 15 oz)    Exam:   General:  Awake- answers questions  Cardiovascular: rrr  Respiratory: clear anterior  Abdomen: +BS, soft  Musculoskeletal: moves all 4 ext   Data Reviewed: Basic Metabolic Panel:  Recent Labs Lab 01/10/14 0329  01/12/14 0406 01/13/14 0500 01/14/14 0553 01/15/14 0320 01/16/14 0700  NA  --   < > 144 145 147 146 144  K  --   < > 3.9  3.9 3.7 4.2 4.3  CL  --   < > 105 107 110 108 106  CO2  --   < > 21 18* 24 23 22   GLUCOSE  --   < > 88 75 99 106* 106*  BUN  --   < > 34* 30* 30* 28* 27*  CREATININE  --   < > 4.15* 3.33* 2.92* 2.62* 2.75*  CALCIUM  --   < > 7.7* 8.0* 8.4 8.4 8.8  MG 2.1  --  1.6 1.8  --   --   --   PHOS  --   --  4.8* 4.5  --   --   --   < > = values in this interval not displayed. Liver Function Tests:  Recent Labs Lab 01/12/14 0406 01/13/14 0500  AST  --  10  ALT  --  13  ALKPHOS  --  90  BILITOT  --  0.3  PROT  --  5.7*  ALBUMIN 2.3* 2.5*   No results found for this basename: LIPASE, AMYLASE,  in the last 168 hours No results found for this basename: AMMONIA,  in the last 168 hours CBC:  Recent Labs Lab 01/12/14 0406 01/12/14 1100 01/13/14 0500 01/14/14 0553 01/15/14 0320  WBC 5.6 5.1 4.6 4.4 4.4  NEUTROABS 3.6  --   --   --   --   HGB 8.1* 8.4* 8.3* 8.6* 8.6*  HCT 24.0* 23.9* 23.9* 24.6* 25.3*  MCV 90.9 90.2 89.5 88.8 89.7  PLT 125* 112* 129* 168 205   Cardiac Enzymes:  Recent Labs Lab 01/09/14 1600 01/10/14 0328 01/10/14 1502 01/14/14 0553  CKTOTAL 660*  --   --  114  TROPONINI  --  <0.30 <0.30  --    BNP (last 3 results) No results found for this basename: PROBNP,  in the last 8760 hours CBG:  Recent Labs Lab 01/13/14 0002 01/13/14 0409 01/13/14 0814 01/13/14 1146 01/14/14 2114  GLUCAP 80 76 77 85 127*    No results found for this or any previous visit (from the past 240 hour(s)).   Studies: No results found.  Scheduled Meds: . aspirin  81 mg Oral Daily  . atorvastatin  20 mg Oral QHS  . benztropine  2 mg Oral QHS  . cloZAPine  100 mg Oral 2 times per day  . cloZAPine  400 mg Oral QHS  . folic acid  1 mg Oral Daily  . heparin  5,000 Units Subcutaneous 3 times per day  . levETIRAcetam  500 mg Oral BID  . pantoprazole  40 mg Oral QHS  . sodium chloride  10-40 mL Intracatheter Q12H  .  venlafaxine  75 mg Oral BID WC   Continuous Infusions: .  sodium chloride      Principal Problem:   Status epilepticus Active Problems:   Acute renal failure   Altered mental status   Metabolic acidosis   Acute respiratory failure   Anemia    Time spent: 25 min    Cova Knieriem  [facility] Pager [phone]. If 7PM-7AM, please contact night-coverage at [email or web address], password [facility] 01/16/2014, 10:08 AM  LOS: 14 days

## 2014-01-17 ENCOUNTER — Inpatient Hospital Stay (HOSPITAL_COMMUNITY)
Admission: RE | Admit: 2014-01-17 | Discharge: 2014-01-21 | DRG: 945 | Disposition: A | Discharge status: Home / Self Care | Payer: Medicare Other | Source: Intra-hospital | Attending: Physical Medicine & Rehabilitation | Admitting: Physical Medicine & Rehabilitation

## 2014-01-17 DIAGNOSIS — Z79899 Other long term (current) drug therapy: Secondary | ICD-10-CM

## 2014-01-17 DIAGNOSIS — R296 Repeated falls: Secondary | ICD-10-CM

## 2014-01-17 DIAGNOSIS — G40909 Epilepsy, unspecified, not intractable, without status epilepticus: Secondary | ICD-10-CM | POA: Diagnosis present

## 2014-01-17 DIAGNOSIS — F259 Schizoaffective disorder, unspecified: Secondary | ICD-10-CM | POA: Diagnosis present

## 2014-01-17 DIAGNOSIS — D649 Anemia, unspecified: Secondary | ICD-10-CM | POA: Diagnosis present

## 2014-01-17 DIAGNOSIS — Z5189 Encounter for other specified aftercare: Principal | ICD-10-CM

## 2014-01-17 DIAGNOSIS — G929 Unspecified toxic encephalopathy: Secondary | ICD-10-CM

## 2014-01-17 DIAGNOSIS — R1310 Dysphagia, unspecified: Secondary | ICD-10-CM

## 2014-01-17 DIAGNOSIS — W19XXXA Unspecified fall, initial encounter: Secondary | ICD-10-CM | POA: Diagnosis present

## 2014-01-17 DIAGNOSIS — G9340 Encephalopathy, unspecified: Secondary | ICD-10-CM | POA: Diagnosis present

## 2014-01-17 DIAGNOSIS — N289 Disorder of kidney and ureter, unspecified: Secondary | ICD-10-CM

## 2014-01-17 DIAGNOSIS — G92 Toxic encephalopathy: Secondary | ICD-10-CM

## 2014-01-17 DIAGNOSIS — E785 Hyperlipidemia, unspecified: Secondary | ICD-10-CM | POA: Diagnosis present

## 2014-01-17 DIAGNOSIS — F3289 Other specified depressive episodes: Secondary | ICD-10-CM | POA: Diagnosis present

## 2014-01-17 DIAGNOSIS — R561 Post traumatic seizures: Secondary | ICD-10-CM

## 2014-01-17 DIAGNOSIS — N189 Chronic kidney disease, unspecified: Secondary | ICD-10-CM | POA: Diagnosis present

## 2014-01-17 DIAGNOSIS — F329 Major depressive disorder, single episode, unspecified: Secondary | ICD-10-CM | POA: Diagnosis present

## 2014-01-17 LAB — BASIC METABOLIC PANEL
BUN: 29 mg/dL — ABNORMAL HIGH (ref 6–23)
CHLORIDE: 102 meq/L (ref 96–112)
CO2: 23 meq/L (ref 19–32)
Calcium: 8.7 mg/dL (ref 8.4–10.5)
Creatinine, Ser: 2.62 mg/dL — ABNORMAL HIGH (ref 0.50–1.35)
GFR calc Af Amer: 32 mL/min — ABNORMAL LOW (ref >90)
GFR calc non Af Amer: 27 mL/min — ABNORMAL LOW (ref >90)
GLUCOSE: 116 mg/dL — AB (ref 70–99)
POTASSIUM: 4.4 meq/L (ref 3.7–5.3)
SODIUM: 138 meq/L (ref 137–147)

## 2014-01-17 LAB — CBC
HCT: 29.8 % — ABNORMAL LOW (ref 39.0–52.0)
Hemoglobin: 10.1 g/dL — ABNORMAL LOW (ref 13.0–17.0)
MCH: 30.7 pg (ref 26.0–34.0)
MCHC: 33.9 g/dL (ref 30.0–36.0)
MCV: 90.6 fL (ref 78.0–100.0)
Platelets: 235 10*3/uL (ref 150–400)
RBC: 3.29 MIL/uL — ABNORMAL LOW (ref 4.22–5.81)
RDW: 12 % (ref 11.5–15.5)
WBC: 5.7 10*3/uL (ref 4.0–10.5)

## 2014-01-17 LAB — CREATININE, SERUM
Creatinine, Ser: 2.57 mg/dL — ABNORMAL HIGH (ref 0.50–1.35)
GFR calc Af Amer: 33 mL/min — ABNORMAL LOW (ref >90)
GFR calc non Af Amer: 28 mL/min — ABNORMAL LOW (ref >90)

## 2014-01-17 MED ORDER — LEVETIRACETAM ER 500 MG PO TB24
500.0000 mg | ORAL_TABLET | Freq: Two times a day (BID) | ORAL | Status: DC
  Administered 2014-01-17 – 2014-01-21 (×8): 500 mg via ORAL
  Filled 2014-01-17 (×12): qty 1

## 2014-01-17 MED ORDER — LEVETIRACETAM ER 500 MG PO TB24: 500.0000 mg | ORAL_TABLET | Freq: Two times a day (BID) | ORAL | Status: DC

## 2014-01-17 MED ORDER — CLOZAPINE 100 MG PO TABS: 100.0000 mg | ORAL_TABLET | Freq: Two times a day (BID) | ORAL | Status: DC

## 2014-01-17 MED ORDER — HEPARIN SODIUM (PORCINE) 5000 UNIT/ML IJ SOLN
5000.0000 [IU] | Freq: Three times a day (TID) | INTRAMUSCULAR | Status: DC
  Administered 2014-01-17 – 2014-01-21 (×11): 5000 [IU] via SUBCUTANEOUS
  Filled 2014-01-17 (×14): qty 1

## 2014-01-17 MED ORDER — HEPARIN SODIUM (PORCINE) 5000 UNIT/ML IJ SOLN: 5000.0000 [IU] | Freq: Three times a day (TID) | INTRAMUSCULAR | Status: DC

## 2014-01-17 MED ORDER — ASPIRIN 81 MG PO CHEW
81.0000 mg | CHEWABLE_TABLET | Freq: Every day | ORAL | Status: DC
  Administered 2014-01-18 – 2014-01-21 (×4): 81 mg via ORAL
  Filled 2014-01-17 (×4): qty 1

## 2014-01-17 MED ORDER — FOLIC ACID 1 MG PO TABS: 1.0000 mg | ORAL_TABLET | Freq: Every day | ORAL | Status: DC

## 2014-01-17 MED ORDER — SORBITOL 70 % SOLN
30.0000 mL | Freq: Every day | Status: DC | PRN
  Administered 2014-01-17 – 2014-01-18 (×2): 30 mL via ORAL
  Filled 2014-01-17 (×2): qty 30

## 2014-01-17 MED ORDER — ASPIRIN 81 MG PO CHEW: 81.0000 mg | CHEWABLE_TABLET | Freq: Every day | ORAL | Status: DC

## 2014-01-17 MED ORDER — CLOZAPINE 100 MG PO TABS
100.0000 mg | ORAL_TABLET | ORAL | Status: DC
  Administered 2014-01-18 – 2014-01-21 (×7): 100 mg via ORAL
  Filled 2014-01-17 (×9): qty 1

## 2014-01-17 MED ORDER — ONDANSETRON HCL 4 MG PO TABS: 4.0000 mg | ORAL_TABLET | Freq: Four times a day (QID) | ORAL | Status: DC | PRN

## 2014-01-17 MED ORDER — RESOURCE THICKENUP CLEAR PO POWD: ORAL | Status: DC

## 2014-01-17 MED ORDER — ACETAMINOPHEN 325 MG PO TABS
325.0000 mg | ORAL_TABLET | ORAL | Status: DC | PRN
  Administered 2014-01-19 – 2014-01-20 (×2): 650 mg via ORAL
  Filled 2014-01-17 (×3): qty 2

## 2014-01-17 MED ORDER — VENLAFAXINE HCL 75 MG PO TABS
75.0000 mg | ORAL_TABLET | Freq: Two times a day (BID) | ORAL | Status: DC
  Administered 2014-01-17 – 2014-01-21 (×8): 75 mg via ORAL
  Filled 2014-01-17 (×10): qty 1

## 2014-01-17 MED ORDER — FOLIC ACID 1 MG PO TABS
1.0000 mg | ORAL_TABLET | Freq: Every day | ORAL | Status: DC
  Administered 2014-01-18 – 2014-01-21 (×4): 1 mg via ORAL
  Filled 2014-01-17 (×5): qty 1

## 2014-01-17 MED ORDER — VENLAFAXINE HCL 75 MG PO TABS: 75.0000 mg | ORAL_TABLET | Freq: Two times a day (BID) | ORAL | Status: DC

## 2014-01-17 MED ORDER — ALBUTEROL SULFATE (2.5 MG/3ML) 0.083% IN NEBU: 2.5000 mg | INHALATION_SOLUTION | RESPIRATORY_TRACT | Status: DC | PRN

## 2014-01-17 MED ORDER — BENZTROPINE MESYLATE 2 MG PO TABS: 2.0000 mg | ORAL_TABLET | Freq: Every day | ORAL | Status: DC

## 2014-01-17 MED ORDER — CLOZAPINE 100 MG PO TABS
400.0000 mg | ORAL_TABLET | Freq: Every day | ORAL | Status: DC
  Administered 2014-01-17 – 2014-01-20 (×4): 400 mg via ORAL
  Filled 2014-01-17 (×5): qty 4

## 2014-01-17 MED ORDER — PANTOPRAZOLE SODIUM 40 MG PO TBEC
40.0000 mg | DELAYED_RELEASE_TABLET | Freq: Every day | ORAL | Status: DC
  Administered 2014-01-17 – 2014-01-20 (×4): 40 mg via ORAL
  Filled 2014-01-17 (×4): qty 1

## 2014-01-17 MED ORDER — BENZTROPINE MESYLATE 2 MG PO TABS
2.0000 mg | ORAL_TABLET | Freq: Every day | ORAL | Status: DC
  Administered 2014-01-17 – 2014-01-20 (×4): 2 mg via ORAL
  Filled 2014-01-17 (×5): qty 1

## 2014-01-17 MED ORDER — ONDANSETRON HCL 4 MG/2ML IJ SOLN: 4.0000 mg | Freq: Four times a day (QID) | INTRAMUSCULAR | Status: DC | PRN

## 2014-01-17 MED ORDER — CLOZAPINE 200 MG PO TABS: 400.0000 mg | ORAL_TABLET | Freq: Every day | ORAL | Status: DC

## 2014-01-17 MED ORDER — ATORVASTATIN CALCIUM 20 MG PO TABS
20.0000 mg | ORAL_TABLET | Freq: Every day | ORAL | Status: DC
  Administered 2014-01-17 – 2014-01-20 (×4): 20 mg via ORAL
  Filled 2014-01-17 (×5): qty 1

## 2014-01-17 NOTE — Progress Notes (Signed)
Rehab admissions - Received call from MD and case manager that patient is medically ready for acute inpatient rehab admission.  Bed available and can plan to admit to inpatient rehab today.  Call me for questions.  #[phone]

## 2014-01-17 NOTE — Progress Notes (Signed)
Patient transported to inpatient rehab, family notified.

## 2014-01-17 NOTE — PMR Pre-admission (Signed)
PMR Admission Coordinator Pre-Admission Assessment  Patient: Brady Hoffman is an 48 y.o., male MRN: [id number] DOB: 06/09/1966 Height: 6\' 2"  (188 cm) Weight: 83.779 kg (184 lb 11.2 oz)              Insurance Information HMO:  No    PPO:       PCP:       IPA:       80/20:       OTHER:   PRIMARY: Medicare A/B      Policy#: [id number]      Subscriber: Mal Amabile  CM Name:        Phone#:       Fax#:   Pre-Cert#:        Employer: Not employed Benefits:  Phone #:       Name: [place] checked Eff. Date: 06/01/90     Deduct: $1260      Out of Pocket Max: none      Life Max: unlimited CIR: 100%      SNF: 100 days Outpatient: 80%     Co-Pay: 20% Home Health: 100%      Co-Pay: none DME: 80%     Co-Pay: 20% Providers: patient's choice  SECONDARY: Medicaid [place] access      Policy#: [id number]      Subscriber: Mal Amabile CM Name:       Phone#:       Fax#:   Pre-Cert#: [id number]       Employer:  Not employed Benefits:  Phone #:  [phone]     Name: Automated Eff. Date: 01/14/14 verified     Deduct:        Out of Pocket Max:        Life Max:   CIR:        SNF:   Outpatient:       Co-Pay:   Home Health:        Co-Pay:   DME:       Co-Pay:     Emergency Contact Information Contact Information   Name Relation Home Work Mobile   Luthi,Martha Mother [phone]  [phone]   [name] Other   [phone]   Carl Best   [phone]     Current Medical History  Patient Admitting Diagnosis: metabolic/toxic encephalopathy   History of Present Illness: A 48 y.o. right-handed male with history of schizoaffective disorder resident of a group home x12 years. Admitted 01/02/2014 after a fall struck his head from ground level. Patient without complaints then developed seizure-like activity and mental status changes. Patient was intubated in the emergency department. Cranial CT scan cervical CT spine negative without acute abnormalities. MRI of the  brain negative. CT abdomen and pelvis unremarkable. Urine drug screen negative. Chemistries revealed a creatinine of 20.63 from a baseline of 1.09 with BUN 130 potassium 5.7. Nephrology services consulted placed on IV fluids. Renal ultrasound negative for hydronephrosis. Neurology services consulted for noted seizure with EEG showing pattern consistent with normal sleep no evidence of encephalopathic process seen. Patient maintained on Keppra therapy. Subcutaneous heparin for DVT prophylaxis. A nasogastric tube remained in place.  Patient was extubated 01/12/2014. Patient did require hemodialysis for a short time with last dialysis 01/04/2014 with latest creatinine 4.15. Physical therapy evaluation completed 01/12/2014 with recommendations for physical medicine rehabilitation consult    Past Medical History  Past Medical History  Diagnosis Date  . Hyperlipidemia   . Schizoaffective disorder   . H/O: GI bleed   .  Seizures   . Depression   . Chronic kidney disease   . Anemia 01/14/2014    Family History  family history is not on file.  Prior Rehab/Hospitalizations:  None   Current Medications  Current facility-administered medications:0.9 %  sodium chloride infusion, 250 mL, Intravenous, PRN, Elsie Stain, MD, Last Rate: 20 mL/hr at 01/09/14 1631, 250 mL at 01/09/14 1631;  albuterol (PROVENTIL) (2.5 MG/3ML) 0.083% nebulizer solution 2.5 mg, 2.5 mg, Nebulization, Q2H PRN, Carin Hock, MD, 2.5 mg at 01/12/14 1438;  aspirin chewable tablet 81 mg, 81 mg, Oral, Daily, [name], MD, 81 mg at 01/17/14 0915 atorvastatin (LIPITOR) tablet 20 mg, 20 mg, Oral, QHS, Jessica U Vann, DO, 20 mg at 01/16/14 2141;  benztropine (COGENTIN) tablet 2 mg, 2 mg, Oral, QHS, Marijean Heath, NP, 2 mg at 01/16/14 2141;  cloZAPine (CLOZARIL) tablet 100 mg, 100 mg, Oral, 2 times per day, Marijean Heath, NP, 100 mg at 01/17/14 0818;  cloZAPine (CLOZARIL) tablet 400 mg, 400 mg, Oral, QHS, Marijean Heath, NP, 400 mg at 0000000 [id number] folic acid (FOLVITE) tablet 1 mg, 1 mg, Oral, Daily, Rande Lawman Rumbarger, RPH, 1 mg at 01/17/14 0915;  heparin injection 5,000 Units, 5,000 Units, Subcutaneous, 3 times per day, Carin Hock, MD, 5,000 Units at 01/17/14 [phone];  levETIRAcetam (KEPPRA XR) 24 hr tablet 500 mg, 500 mg, Oral, BID, Renee A Kuneff, DO, 500 mg at 01/17/14 0915 metoprolol (LOPRESSOR) injection 5 mg, 5 mg, Intravenous, Q3H PRN, Colbert Coyer, MD, 5 mg at 01/10/14 0243;  pantoprazole (PROTONIX) EC tablet 40 mg, 40 mg, Oral, QHS, Rande Lawman Rumbarger, RPH, 40 mg at 01/16/14 2141;  RESOURCE THICKENUP CLEAR, , Oral, PRN, Raylene Miyamoto, MD;  sodium chloride 0.9 % injection 10-40 mL, 10-40 mL, Intracatheter, Q12H, Jessica U Vann, DO sodium chloride 0.9 % injection 10-40 mL, 10-40 mL, Intracatheter, PRN, Geradine Girt, DO, 30 mL at 01/14/14 [id number];  venlafaxine [facility]) tablet 75 mg, 75 mg, Oral, BID WC, Durward Parcel, MD, 75 mg at 01/17/14 0818  Patients Current Diet: General  Precautions / Restrictions Precautions Precautions: Fall Precaution Comments: Seizure Precautions Restrictions Weight Bearing Restrictions: No   Prior Activity Level Limited Community (1-2x/wk): Gets out 1-2 X a week.  Mom lives in [place] and takes him home on Friday every other weekend.  On opposite Friday, they go out to dinner.  Home Assistive Devices / Equipment Home Assistive Devices/Equipment: None  Prior Functional Level Prior Function Level of Independence: Independent  Current Functional Level Cognition  Overall Cognitive Status: No family/caregiver present to determine baseline cognitive functioning Orientation Level: Oriented to person;Oriented to place;Disoriented to time;Disoriented to situation General Comments: pt with hx of Schizoaffective Disorder, though unclear true baseline as no visitors present.  pt is appropriate and answers all questions.  pt is  oriented to loacation and situation and is able to give details about group home.      Extremity Assessment (includes Sensation/Coordination)          ADLs       Mobility  Overal bed mobility: Needs Assistance;+2 for physical assistance Bed Mobility: Supine to Sit Supine to sit: Min guard Sit to supine: Max assist;+2 for physical assistance;+2 for safety/equipment General bed mobility comments: pulls up on bedrails, increased effort to come to sit    Transfers  Overall transfer level: Needs assistance Equipment used: Rolling walker (2 wheeled) Transfers: Sit to/from Stand Sit to Stand: Mod assist;+2 safety/equipment General  transfer comment: posterior bias initially and c/o dizziness stood several seconds prior to attempting to move    Ambulation / Gait / Stairs / Emergency planning/management officer  Ambulation/Gait Ambulation Distance (Feet): 25 Feet (and 15') General Gait Details: assist to maneuver walker, for balance, cues for direction, postural control    Posture / Balance Dynamic Sitting Balance Sitting balance - Comments: pt with strong R lateral lean and attempts to correct this with cueing, but unable to sit at midline without Max A.      Special needs/care consideration BiPAP/CPAP No CPM No Continuous Drip IV No Dialysis No        Life Vest No Oxygen No Special Bed No Trach Size No Wound Vac (area) No    Skin No                               Bowel mgmt: Last BM 01/11/14 Bladder mgmt: Urinary catheter Diabetic mgmt No    Previous [place] Name: [place]y: No Additional Comments: pt indicates he is ambulatory and that the group home staff performed all homemaking tasks for residents.    Discharge Living Setting Plans for Discharge Living Setting: Other (Comment) (Group Home) Type of Home at Discharge: [place] (Has a roommate in his [place] Name at Discharge: [facility] Discharge Home Layout: One  level Discharge Home Access: Level entry Does the patient have any problems obtaining your medications?: No  Social/Family/Support Systems Contact Information: Amyr Sluder - mother Anticipated Caregiver: Group home attendants Anticipated Caregiver's Contact Information: Jana Half - mom (h) [phone] (c) [phone] Ability/Limitations of Caregiver: Group home provided supervision Caregiver Availability: 24/7 Discharge Plan Discussed with Primary Caregiver: Yes Is Caregiver In Agreement with Plan?: Yes Does Caregiver/Family have Issues with Lodging/Transportation while Pt is in Rehab?: No Group home has 6 residents in it and supervision is provided.  Goals/Additional Needs Patient/Family Goal for Rehab: PT/OT/ST supervision goals Expected length of stay: 15-20 days Cultural Considerations: Schizoaffective disorder.  Lives in a group home. Dietary Needs: Dys 2, nectar thick liquids Equipment Needs: TBD Pt/Family Agrees to Admission and willing to participate: Yes (Spoke with patient's mother.) Program Orientation Provided & Reviewed with Pt/Caregiver Including Roles  & Responsibilities: Yes  Decrease burden of Care through IP rehab admission: N/A  Possible need for SNF placement upon discharge: Not anticipated  Patient Condition: This patient's medical and functional status has changed since the consult dated: 01/13/14 in which the Rehabilitation Physician determined and documented that the patient's condition is appropriate for intensive rehabilitative care in an inpatient rehabilitation facility. See "History of Present Illness" (above) for medical update. Functional changes are:  Ambulated 25 ft RW with mod assist. Patient's medical and functional status update has been discussed with the Rehabilitation physician and patient remains appropriate for inpatient rehabilitation. Will admit to inpatient rehab today.  Preadmission Screen Completed By:  Retta Diones, 01/17/2014  11:18 AM ______________________________________________________________________   Discussed status with Dr. Naaman Plummer on 01/17/14 at 1142 and received telephone approval for admission today.  Admission Coordinator:  Retta Diones, time1144/Date02/16/15

## 2014-01-17 NOTE — Progress Notes (Signed)
Pt received at 17:00. Oriented to room and call bell. Fall Prevention reviewed with patient and understanding verbalized. Rehab process review and pt verbalizes understanding. Continue Plan of care.

## 2014-01-17 NOTE — Progress Notes (Signed)
Physical Therapy Treatment Patient Details Name: Brady Hoffman MRN: [id number] DOB: 06/06/1966 Today's Date: 01/17/2014 Time: 3291-9166 PT Time Calculation (min): 24 min  PT Assessment / Plan / Recommendation  History of Present Illness pt presents post Seizure and Fall with Encephalopathy.     PT Comments   Progressing with ambulation, but still globally weak and fatigues with activity as described below.  Planned d/c today to CIR.  Will benefit from comprehensive rehab prior to d/c back to group home.  Follow Up Recommendations  CIR     Does the patient have the potential to tolerate intense rehabilitation   Yes  Barriers to Discharge  None      Equipment Recommendations  None recommended by PT    Recommendations for Other Services  None  Frequency Min 3X/week   Progress towards PT Goals Progress towards PT goals: Progressing toward goals  Plan Current plan remains appropriate    Precautions / Restrictions Precautions Precautions: Fall Precaution Comments: Seizure Precautions   Pertinent Vitals/Pain No pain complaints, HR 117 with ambulation    Mobility  Bed Mobility Bed Mobility: Supine to Sit Supine to sit: Supervision Sit to supine: Supervision General bed mobility comments: pulls up on bedrails, supervision for safety Transfers Equipment used: Rolling walker (2 wheeled) Transfers: Sit to/from Stand Sit to Stand: Min assist General transfer comment: posterior bias initially, corrected with hands on walker Ambulation/Gait Ambulation/Gait assistance: Min assist Ambulation Distance (Feet): 140 Feet Assistive device: Rolling walker (2 wheeled) Gait Pattern/deviations: Step-through pattern;Drifts right/left;Wide base of support;Decreased stride length;Shuffle General Gait Details: assist for walker due to veering to either side.  cues to look up and loss of balance c/o dizziness.  Cues for visual target, assist to turn walker    Exercises General Exercises -  Lower Extremity Hip ABduction/ADduction: AROM;Both;10 reps;Standing Heel Raises: AROM;10 reps;Standing Mini-Sqauts: AROM;Both;5 reps (sit<>stand)     PT Goals (current goals can now be found in the care plan section)    Visit Information  Last PT Received On: 01/17/14 Assistance Needed: +1 History of Present Illness: pt presents post Seizure and Fall with Encephalopathy.      Subjective Data      Cognition  Cognition Arousal/Alertness: Awake/alert Behavior During Therapy: WFL for tasks assessed/performed Overall Cognitive Status: No family/caregiver present to determine baseline cognitive functioning    Balance  Balance Overall balance assessment: Needs assistance Standing balance-Leahy Scale: Poor Standing balance comment: posterior bias with standing without UE assist High level balance activites: Side stepping;Backward walking High Level Balance Comments: forward march (with one UE support)  End of Session PT - End of Session Equipment Utilized During Treatment: Gait belt Activity Tolerance: Patient limited by fatigue Patient left: in bed;with call bell/phone within reach;with bed alarm set   GP     [facility] 01/17/2014, 1:02 PM [place], [place] 01/17/2014

## 2014-01-17 NOTE — Progress Notes (Addendum)
Speech Language Pathology Treatment: Dysphagia  Patient Details Name: Brady Hoffman MRN: [id number] DOB: 02-25-1966 Today's Date: 01/17/2014 Time: 3437-3578 SLP Time Calculation (min): 12 min  Assessment / Plan / Recommendation Clinical Impression  Pt observed with regulated and independent, somewhat impulsive, intake of liquids and solids. No further evidence of aspiration observed. Acute airway compromise appears to have resolved. Pt may return to a regular texture diet and thin liquids. No SLP f/u needed for dysphagia. Will go to CIR. May benefit from cognitive linguistic eval at that setting though pt again appears to be at baseline.    HPI HPI: 48 years old male with Schizoaffective disorder, resident of [place] home. Was in his usual state of health and sustained a fall with ?head trauma. He then had a generalized seizure. At arrival to the ED on acute renal failure, intubated for airway protection x2. Pt reports a h/o reflux.   Pertinent Vitals NA  SLP Plan  All goals met    Recommendations Diet recommendations: Regular;Thin liquid Liquids provided via: Cup;Straw Medication Administration: Whole meds with liquid Supervision: Patient able to self feed Postural Changes and/or Swallow Maneuvers: Seated upright 90 degrees              Oral Care Recommendations: Oral care BID Follow up Recommendations: None Plan: All goals met    GO    Brady [place], MA CCC-SLP [phone]  Lynann Beaver 01/17/2014, 11:04 AM

## 2014-01-17 NOTE — Discharge Summary (Addendum)
Physician Discharge Summary  Brady Hoffman DGL:[id number] DOB: [birth year] DOA: 01/02/2014  PCP: No primary provider on file.  Admit date: 01/02/2014 Discharge date: 01/17/2014  Time spent: 35 minutes  Recommendations for Outpatient Follow-up:  1. BMP weekly to trend Cr 2. Encourage PO intake of fluids 3. Cardiac thin diet 4. Will need outpt follow up: psych, neuro, nephrology  Discharge Diagnoses:  Principal Problem:   Status epilepticus Active Problems:   Acute renal failure   Altered mental status   Metabolic acidosis   Acute respiratory failure   Anemia   Discharge Condition: stable/improved  Diet recommendation:   Filed Weights   01/15/14 0500 01/15/14 2100 01/16/14 2024  Weight: 85.446 kg (188 lb 6 oz) 85.7 kg (188 lb 15 oz) 83.779 kg (184 lb 11.2 oz)    History of present illness:  48 years old male with Schizoaffective disorder, resident of a group home. Was in his usual state of health and sustained a fall with head trauma. He then had a generalized seizure. At arrival to the ED the patient was unresponsive and was intubated for airway protection. He was found to have a creatinine of 20 (his previous one was 1, K 5.7 with metabolic acidosis and elevated WBC. CT head was unremarkable and CT abdomen did not show any intraabdominal pathology but showed a LLL consolidation likely secondary to aspiration. His ABG at admission was pH 7.17, pCO2: 34, pO2 476.   Hospital Course:  ?Aspiration pneumonia --CXR clear  Acute resp failure - resolved  Extubated 2/11  - IS and flutter valve.  - Continue to monitor off abx  Afib/flutter with RVR - no known hx --RESOLVED  HTN (no known hx)  Hyperlipidemia  - Continue aspirin.  - Hold statin.   ARF of unclear etiology (no known history) - SCr improving slowly. 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  - Cr was decreasing until today- plan CIR on Monday with BMPs periodically   Anemia - stable  DVT ppx  - SQH.  Afebrile  No leukocytosis  - All cultures negative to date.  - Trend fever curve off abx.   Schizoaffective disorder  Seizure s/p fall with head trauma  AMS (post ictal vs uremia vs drug OD / toxin vs home regimen SE)  EEG and MRI - neg  2/9 repeat EEG neg  - Neurology following, appreciate recs.  - Continue Keppra, can d/c as outpt per neuro  - Would consider dantrolene challenge if rigidity noted again or high fevers (none to date).  - Clozapine 100mg  QAM and 1pm, 400mg  QHS for psychosis.  - Cogentin 2mg  QHS for EPS.  - Effexor 150mg  daily for depression.  - Appreciate psych recs.   Procedures: 2/1 CT abd/pelvis - unremarkable; LLL consolidation  2/1 CT Head - no acute intracranial abnormalities  2/1 CT C-spine - no acute cervical spine abnormalities  2/1>>> started on CRRT (through 2/3)  2/2 Renal u /s - No hydro  2/2 - Fomepizole started, EG back neg, dce'd  2/3 - EEG - No evidence of epileptiform discharges, No electrographic seizures noted. This is a normal asleep EEG. No electrographic seizures noted.  2/4- more agitated  2/5 - SCr worse  2/5 - too aggitated again for extubation  2/6 MRI >>> neg acute, premature atrophy  2/6 Extubated  2/8 Abdominal xray - NGT in proper place  2/9 EEG - normal sleep pattern. No evidence of significant encephalopathic process seen nor evidence of epileptic d/o  2/12 Extubated    Consultations:  Psych  PCCM  Renal   Discharge Exam: Filed Vitals:   01/17/14 0957  BP: 139/88  Pulse: 94  Temp: 98.2 F (36.8 C)  Resp: 18    General: awake Cardiovascular: rrr Respiratory: clear anterior  Discharge Instructions     Medication List     ASK your doctor about these medications       atorvastatin 20 MG tablet  Commonly known as:  LIPITOR  Take 20 mg by mouth at bedtime.     benztropine 1 MG tablet  Commonly known as:  COGENTIN  Take 2 mg by mouth at bedtime.     CITRUCEL FIBER LAXATIVE packet  Generic drug:  methylcellulose  Take by mouth 3 (three) times daily as needed for constipation. Take as directed on label     cloZAPine 100 MG tablet  Commonly known as:  CLOZARIL  Take 100-400 mg by mouth 2 (two) times daily. Take 1 tablet (100  Mg) twice daily at 8am and 1pm, take 4 tablets (400 mg) at bedtime     docusate sodium 100 MG capsule  Commonly known as:  COLACE  Take 200 mg by mouth 2 (two) times daily.     fluPHENAZine 5 MG tablet  Commonly known as:  PROLIXIN  Take 5 mg by mouth 2 (two) times daily.     omeprazole 40 MG capsule  Commonly known as:  PRILOSEC  Take 40 mg by mouth daily.     polyethylene glycol packet  Commonly known as:  MIRALAX / GLYCOLAX  Take 17 g by mouth daily. Mix 1 capful in 4-8 oz of water or juice by mouth daily     senna 8.6 MG Tabs tablet  Commonly known as:  SENOKOT  Take 2 tablets by mouth at bedtime as needed for mild constipation.     venlafaxine XR 75 MG 24 hr capsule  Commonly known as:  EFFEXOR-XR  Take 225 mg by mouth daily.     Vitamin D3 5000 UNITS Tabs  Take 5,000 Units by mouth at bedtime.       Allergies  Allergen Reactions  . Risperidone And Related     Pt. States head feels like a rock      The results of significant diagnostics from this hospitalization (including imaging, microbiology, ancillary and laboratory) are listed below for reference.    Significant Diagnostic Studies: Ct Abdomen Pelvis Wo Contrast  01/02/2014   CLINICAL DATA:  Patient fell approximately 8 hr ago and hit head, developed seizure and mental status changes, intubated  EXAM: CT ABDOMEN AND PELVIS WITHOUT CONTRAST  TECHNIQUE: Multidetector CT imaging of the abdomen and pelvis  was performed following the standard protocol without intravenous contrast.  COMPARISON:  None.  FINDINGS: There is left lower lobe consolidation. There is likely a small left effusion. There is beam artifact across lung base from positioning of the patient's arms. An NG tube extends with its tip just barely into the stomach.  Liver, spleen, pancreas, kidneys, and adrenal glands are normal. There are gallstones. Aorta is not dilated. Bowel is normal. No ascites. There is a Foley balloon catheter in the bladder. Reproductive organs are normal. No acute musculoskeletal  findings.  IMPRESSION: 1. Left lower lobe consolidation and small left effusion. The possibility of aspiration pneumonitis cannot be excluded. 2. NG tube tip just barely into stomach.   Electronically Signed   By: Skipper Cliche M.D.   On: 01/02/2014 20:35   Ct Head Wo Contrast  01/02/2014   CLINICAL DATA:  Seizure this afternoon, fell earlier today, history hyperlipidemia, schizoaffective disorder  EXAM: CT HEAD WITHOUT CONTRAST  CT CERVICAL SPINE WITHOUT CONTRAST  TECHNIQUE: Multidetector CT imaging of the head and cervical spine was performed following the standard protocol without intravenous contrast. Multiplanar CT image reconstructions of the cervical spine were also generated.  COMPARISON:  01/22/2007  FINDINGS: CT HEAD FINDINGS  Minimal atrophy.  Normal ventricular morphology.  No midline shift or mass effect.  Otherwise normal appearance of brain parenchyma.  No intracranial hemorrhage, mass lesion or evidence acute infarction.  No extra-axial fluid collections.  Bones and sinuses unremarkable.  CT CERVICAL SPINE FINDINGS  Skullbase intact.  Prevertebral soft tissues normal thickness.  Nasogastric tube coiled in hypopharynx.  Vertebral body and disc space heights maintained.  No acute fracture, subluxation or bone destruction.  Soft tissues unremarkable.  IMPRESSION: No acute intracranial abnormalities.  No acute cervical spine  abnormalities.   Electronically Signed   By: Lavonia Dana M.D.   On: 01/02/2014 18:07   Ct Cervical Spine Wo Contrast  01/02/2014   CLINICAL DATA:  Seizure this afternoon, fell earlier today, history hyperlipidemia, schizoaffective disorder  EXAM: CT HEAD WITHOUT CONTRAST  CT CERVICAL SPINE WITHOUT CONTRAST  TECHNIQUE: Multidetector CT imaging of the head and cervical spine was performed following the standard protocol without intravenous contrast. Multiplanar CT image reconstructions of the cervical spine were also generated.  COMPARISON:  01/22/2007  FINDINGS: CT HEAD FINDINGS  Minimal atrophy.  Normal ventricular morphology.  No midline shift or mass effect.  Otherwise normal appearance of brain parenchyma.  No intracranial hemorrhage, mass lesion or evidence acute infarction.  No extra-axial fluid collections.  Bones and sinuses unremarkable.  CT CERVICAL SPINE FINDINGS  Skullbase intact.  Prevertebral soft tissues normal thickness.  Nasogastric tube coiled in hypopharynx.  Vertebral body and disc space heights maintained.  No acute fracture, subluxation or bone destruction.  Soft tissues unremarkable.  IMPRESSION: No acute intracranial abnormalities.  No acute cervical spine abnormalities.   Electronically Signed   By: Lavonia Dana M.D.   On: 01/02/2014 18:07   Mr Brain Wo Contrast  01/07/2014   CLINICAL DATA:  [place] with head trauma.  Mental status changes.  EXAM: MRI HEAD WITHOUT CONTRAST  TECHNIQUE: Multiplanar, multiecho pulse sequences of the brain and surrounding structures were obtained without intravenous contrast.  COMPARISON:  Head CT 01/02/2014  FINDINGS: Diffusion imaging does not show any acute or subacute infarction. The brainstem and cerebellum are normal. The cerebral hemispheres show mild generalized atrophy but there is no evidence of focal infarction, mass lesion, hemorrhage, hydrocephalus or extra-axial collection. No pituitary mass. No fluid in the sinuses, or middle ears. There is some  fluid in the mastoid air cells on the left.  IMPRESSION: No acute or focal intracranial finding. Brain atrophy premature for age.   Electronically Signed   By: Nelson Chimes M.D.   On: 01/07/2014 11:25   US Renal  01/03/2014   CLINICAL DATA:  Small amount of perinephric fluid identified.  EXAM: RENAL/URINARY TRACT ULTRASOUND COMPLETE  COMPARISON:  None.  FINDINGS: Right Kidney:  Length: 11 cm. Mild increased cortical echogenicity. No mass or hydronephrosis  visualized.  Left Kidney:  Length: 10.2 cm. Echogenicity within normal limits. No mass or hydronephrosis visualized.  Bladder:  Appears normal for degree of bladder distention.  IMPRESSION: 1. No hydronephrosis. 2. Mild increased cortical echogenicity of the right kidney.   Electronically Signed   By: Kerby Moors M.D.   On: 01/03/2014 14:41   Dg Pelvis Portable  01/02/2014   CLINICAL DATA:  Fall and seizures  EXAM: PORTABLE PELVIS 1-2 VIEWS  COMPARISON:  None.  FINDINGS: There is no evidence of pelvic fracture or diastasis. No other pelvic bone lesions are seen.  IMPRESSION: Negative.   Electronically Signed   By: Kathreen Devoid   On: 01/02/2014 17:54   Dg Chest Port 1 View  01/13/2014   CLINICAL DATA:  Congestion.  EXAM: PORTABLE CHEST - 1 VIEW  COMPARISON:  Single view of the chest 01/12/2014 and 01/11/2014.  FINDINGS: Endotracheal tube has been removed. Left IJ catheter and NG tube remain in place. Lungs are clear. Heart size is normal. No pneumothorax or pleural effusion.  IMPRESSION: Status post extubation.  Lungs are clear.   Electronically Signed   By: Inge Rise M.D.   On: 01/13/2014 07:43   Dg Chest Port 1 View  01/12/2014   CLINICAL DATA:  Followup effusion, shortness of breath.  EXAM: PORTABLE CHEST - 1 VIEW  COMPARISON:  01/11/2014  FINDINGS: Endotracheal tube with tip 5.6 cm above the carina. Left IJ central venous catheter with tip obliquely oriented over the region of the SVC unchanged. Enteric tube courses into the region of the  stomach and off the inferior portion of the film as tip is not visualized. Lungs are adequately inflated with minimal left retrocardiac opacification which may be due to mild atelectasis/effusion. There is mild linear atelectasis in the left midlung. Cardiomediastinal silhouette and remainder of the exam is unchanged.  IMPRESSION: Minimal left retrocardiac opacification unchanged and may be due to atelectasis/effusion. Cannot completely exclude infection. Linear atelectasis left midlung.  Tubes and lines as described.   Electronically Signed   By: Marin Olp M.D.   On: 01/12/2014 07:28   Dg Chest Port 1 View  01/11/2014   CLINICAL DATA:  Acute respiratory failure.  EXAM: PORTABLE CHEST - 1 VIEW  COMPARISON:  01/10/2014 and 01/07/2014  FINDINGS: Endotracheal tube, NG tube, and central venous catheter appear in good position, unchanged. Heart size and vascularity are normal. Slight increased density at the left lung base probably represents a small posterior irregularity fusion.  IMPRESSION: Probable new small left pleural effusion.   Electronically Signed   By: Rozetta Nunnery M.D.   On: 01/11/2014 07:03   Dg Chest Port 1 View  01/10/2014   CLINICAL DATA:  Status post intubation  EXAM: PORTABLE CHEST - 1 VIEW  COMPARISON:  01/07/2014  FINDINGS: An endotracheal tube is noted 5 cm above the carina. A nasogastric catheter is seen extending into the stomach. The cardiac shadow is stable. Previously noted left jugular line is again seen at the junction of the innominate vein and superior vena cava. The lungs are clear.  IMPRESSION: Tubes and lines as described above.  No acute abnormality is noted.   Electronically Signed   By: Inez Catalina M.D.   On: 01/10/2014 12:59   Dg Chest Port 1 View  01/07/2014   CLINICAL DATA:  Intubated.  EXAM: PORTABLE CHEST - 1 VIEW  COMPARISON:  01/06/2014  FINDINGS: Endotracheal tube ends in the mid thoracic trachea. There is a left IJ catheter,  tip near the SVC origin. Orogastric  tube crosses the diaphragm.  No cardiomegaly given technique. Lung aeration improved from prior. No effusion or pneumothorax.  IMPRESSION: 1. Tubes and lines remain in good position. 2. Improving lung aeration.   Electronically Signed   By: Tiburcio Pea M.D.   On: 01/07/2014 06:07   Dg Chest Port 1 View  01/06/2014   CLINICAL DATA:  Intubation .  EXAM: PORTABLE CHEST - 1 VIEW  COMPARISON:  DG CHEST 1V PORT dated 01/05/2014 .  FINDINGS: Endotracheal tube tip 3.7 cm above the carina. Left IJ line and NG tube in stable position. Patient is rotated to the left making evaluation difficult. Bibasilar atelectasis and/or infiltrates are present. Heart size and pulmonary vascularity normal. No pleural effusion or pneumothorax. No acute osseous abnormality.  IMPRESSION: 1. Endotracheal tube tip noted 3.7 cm above the carina. Left IJ line and NG tube in stable position. 2. Bibasilar atelectasis and/or pneumonia.   Electronically Signed   By: Maisie Fus  Register   On: 01/06/2014 07:47   Dg Chest Port 1 View  01/05/2014   CLINICAL DATA:  Question aspiration.  EXAM: PORTABLE CHEST - 1 VIEW  COMPARISON:  01/04/2014.  FINDINGS: Endotracheal tube ends in the upper thoracic trachea, nearly 10 cm above the carina. An enteric tube crosses the diaphragm. Left IJ catheter, tip near the SVC origin.  Improved aeration at the left base. The opacity at the left base is most likely atelectasis based on previous CT imaging. Stable heart size. No edema or pneumothorax.  IMPRESSION: 1. Interval retraction of endotracheal tube, now ending in the upper thoracic trachea (10 cm above the carina). 2. Partial clearing of left lower atelectasis.   Electronically Signed   By: Tiburcio Pea M.D.   On: 01/05/2014 05:41   Dg Chest Port 1 View  01/04/2014   CLINICAL DATA:  Evaluate endotracheal tube. Hyperlipidemia. GI bleed. Acute renal failure.  EXAM: PORTABLE CHEST - 1 VIEW  COMPARISON:  DG CHEST 1V PORT dated 01/03/2014  FINDINGS: Patient rotated  right. Endotracheal tube 4.8 cm above carina. Left internal jugular line with tip at high SVC.  Normal heart size. No pleural effusion or pneumothorax. Right lung clear. Given differences in technique and positioning, similar left base airspace disease.  IMPRESSION: Appropriate position of endotracheal tube.  Similar left base airspace disease. Favor atelectasis. Infection or aspiration difficult to exclude.   Electronically Signed   By: Jeronimo Greaves M.D.   On: 01/04/2014 07:39   Dg Chest Port 1 View  01/03/2014   CLINICAL DATA:  Check endotracheal tube position.  EXAM: PORTABLE CHEST - 1 VIEW  COMPARISON:  01/02/2014  FINDINGS: Endotracheal tube tip lies 5.3 cm above the carina. Left internal jugular central venous line and nasogastric tube are stable. There is mild medial lung base atelectasis. The lungs are otherwise clear. Cardiac silhouette is normal in size. Normal mediastinal contour.  IMPRESSION: Support apparatus is stable and well positioned.  Mild medial lung base atelectasis, increased from previous day's study.   Electronically Signed   By: Amie Portland M.D.   On: 01/03/2014 07:11   Dg Chest Port 1 View  01/02/2014   CLINICAL DATA:  Left IJ central venous catheter  EXAM: PORTABLE CHEST - 1 VIEW  COMPARISON:  Chest x-ray from the same day at 5:32 p.m.  FINDINGS: Improved positioning of orogastric tube, now reaching the stomach. ETT ends at the level of the mid thoracic trachea. New left IJ catheter, tip at  the level of the upper SVC. No definitive pneumothorax. Streaky retrocardiac opacity appears stable from prior, most likely atelectasis based on intervening abdominal CT. Normal heart size.  IMPRESSION: 1. New left IJ catheter.  No adverse feature. 2. Improved positioning of orogastric tube. 3. Retrocardiac atelectasis.   Electronically Signed   By: Jorje Guild M.D.   On: 01/02/2014 22:35   Dg Chest Port 1 View  01/02/2014   CLINICAL DATA:  Fall, seizures  EXAM: PORTABLE CHEST - 1 VIEW   COMPARISON:  None.  FINDINGS: There is an endotracheal tube with the tip 3.6 cm above the carina. There are low lung volumes. There is no focal consolidation, pleural effusion or pneumothorax. Normal cardiomediastinal silhouette. The osseous structures are unremarkable.  There is a nasogastric tube with the tip in the upper mid esophagus.  IMPRESSION: 1. Endotracheal tube with the tip 3.6 cm above the carina. 2. There is a nasogastric tube with the tip in the upper mid esophagus.   Electronically Signed   By: Kathreen Devoid   On: 01/02/2014 17:54   Dg Abd Portable 1v  01/09/2014   CLINICAL DATA:  NG tube placement.  EXAM: PORTABLE ABDOMEN - 1 VIEW  COMPARISON:  10/31/2011  FINDINGS: NG tube is seen with the tip in the body of the stomach. The side port is in the proximal stomach.  Gas throughout nondistended large and small bowel. Right groin dialysis catheter is in place with the tip in the upper pelvis.  IMPRESSION: NG tube in the body of the stomach.   Electronically Signed   By: Rolm Baptise M.D.   On: 01/09/2014 17:31    Microbiology: No results found for this or any previous visit (from the past 240 hour(s)).   Labs: Basic Metabolic Panel:  Recent Labs Lab 01/12/14 0406 01/13/14 0500 01/14/14 0553 01/15/14 0320 01/16/14 0700 01/17/14 0649  NA 144 145 147 146 144 138  K 3.9 3.9 3.7 4.2 4.3 4.4  CL 105 107 110 108 106 102  CO2 21 18* 24 23 22 23   GLUCOSE 88 75 99 106* 106* 116*  BUN 34* 30* 30* 28* 27* 29*  CREATININE 4.15* 3.33* 2.92* 2.62* 2.75* 2.62*  CALCIUM 7.7* 8.0* 8.4 8.4 8.8 8.7  MG 1.6 1.8  --   --   --   --   PHOS 4.8* 4.5  --   --   --   --    Liver Function Tests:  Recent Labs Lab 01/12/14 0406 01/13/14 0500  AST  --  10  ALT  --  13  ALKPHOS  --  90  BILITOT  --  0.3  PROT  --  5.7*  ALBUMIN 2.3* 2.5*   No results found for this basename: LIPASE, AMYLASE,  in the last 168 hours No results found for this basename: AMMONIA,  in the last 168  hours CBC:  Recent Labs Lab 01/12/14 0406 01/12/14 1100 01/13/14 0500 01/14/14 0553 01/15/14 0320  WBC 5.6 5.1 4.6 4.4 4.4  NEUTROABS 3.6  --   --   --   --   HGB 8.1* 8.4* 8.3* 8.6* 8.6*  HCT 24.0* 23.9* 23.9* 24.6* 25.3*  MCV 90.9 90.2 89.5 88.8 89.7  PLT 125* 112* 129* 168 205   Cardiac Enzymes:  Recent Labs Lab 01/10/14 1502 01/14/14 0553  CKTOTAL  --  114  TROPONINI <0.30  --    BNP: BNP (last 3 results) No results found for this basename: PROBNP,  in  the last 8760 hours CBG:  Recent Labs Lab 01/13/14 0409 01/13/14 0814 01/13/14 1146 01/14/14 2114 01/16/14 2023  GLUCAP 76 77 85 127* 105*       Signed:  Delene Morais  [facility] 01/17/2014, 10:26 AM

## 2014-01-17 NOTE — H&P (Signed)
Physical Medicine and Rehabilitation Admission H&P  Chief Complaint   Patient presents with   .  Seizures    Chief complaint : Headache  HPI: Brady Hoffman is a 48 y.o. right-handed male with history of schizoaffective disorder resident of a group home x12 years. Admitted 01/02/2014 after a fall struck his head from ground level. Patient without complaints then developed seizure-like activity and mental status changes. Patient was intubated in the emergency department. Cranial CT scan cervical CT spine negative without acute abnormalities. MRI of the brain negative. CT abdomen and pelvis unremarkable. Urine drug screen negative. Chemistries revealed a creatinine of 20.63 from a baseline of 1.09 with BUN 130 potassium 5.7. Nephrology services consulted placed on IV fluids. Renal ultrasound negative for hydronephrosis. Neurology services consulted for noted seizure with EEG showing pattern consistent with normal sleep no evidence of encephalopathic process seen. Patient maintained on Keppra therapy. Subcutaneous heparin for DVT prophylaxis. Noted dysphagia with diet advanced to a dysphagia 2 nectar liquids..  Patient was extubated 01/12/2014. Patient did require hemodialysis for a short time with last dialysis 01/04/2014 with latest creatinine 2.92. Physical therapy evaluation completed 01/12/2014 with recommendations for physical medicine rehabilitation consult. Admitted for comprehensive rehabilitation program  ROS Review of Systems  Psychiatric/Behavioral: Positive for depression.  Schizoaffective disorder  All other systems reviewed and are negative  Past Medical History   Diagnosis  Date   .  Hyperlipidemia    .  Schizoaffective disorder    .  H/O: GI bleed    .  Seizures    .  Depression    .  Chronic kidney disease    .  Anemia  01/14/2014    Past Surgical History   Procedure  Laterality  Date   .  Mole removal     .  Wisdom tooth extraction     .  Esophagogastroduodenoscopy    11/01/2011     Procedure: ESOPHAGOGASTRODUODENOSCOPY (EGD); Surgeon: Beryle Beams; Location: [facility]; Service: Endoscopy; Laterality: N/A;    History reviewed. No pertinent family history.  Social History: reports that he has never smoked. He has never used smokeless tobacco. He reports that he does not drink alcohol or use illicit drugs.  Allergies:  Allergies   Allergen  Reactions   .  Risperidone And Related      Pt. States head feels like a rock    Medications Prior to Admission   Medication  Sig  Dispense  Refill   .  atorvastatin (LIPITOR) 20 MG tablet  Take 20 mg by mouth at bedtime.     .  benztropine (COGENTIN) 1 MG tablet  Take 2 mg by mouth at bedtime.     .  Cholecalciferol (VITAMIN D3) 5000 UNITS TABS  Take 5,000 Units by mouth at bedtime.     .  cloZAPine (CLOZARIL) 100 MG tablet  Take 100-400 mg by mouth 2 (two) times daily. Take 1 tablet (100 Mg) twice daily at 8am and 1pm, take 4 tablets (400 mg) at bedtime     .  docusate sodium (COLACE) 100 MG capsule  Take 200 mg by mouth 2 (two) times daily.     .  fluPHENAZine (PROLIXIN) 5 MG tablet  Take 5 mg by mouth 2 (two) times daily.     .  methylcellulose (CITRUCEL FIBER LAXATIVE) packet  Take by mouth 3 (three) times daily as needed for constipation. Take as directed on label     .  omeprazole (PRILOSEC) 40 MG capsule  Take 40 mg by mouth daily.     .  polyethylene glycol (MIRALAX / GLYCOLAX) packet  Take 17 g by mouth daily. Mix 1 capful in 4-8 oz of water or juice by mouth daily     .  senna (SENOKOT) 8.6 MG TABS tablet  Take 2 tablets by mouth at bedtime as needed for mild constipation.     Marland Kitchen  venlafaxine XR (EFFEXOR-XR) 75 MG 24 hr capsule  Take 225 mg by mouth daily.      Home:  Home Living  Family/patient expects to be discharged to:: Group home  Additional Comments: pt indicates he is ambulatory and that the group home staff performed all homemaking tasks for residents.  Functional History:   Functional  Status:  Mobility:      ADL:   Cognition:  Cognition  Overall Cognitive Status: No family/caregiver present to determine baseline cognitive functioning  Orientation Level: Oriented X4  Cognition  Arousal/Alertness: Awake/alert  Behavior During Therapy: WFL for tasks assessed/performed  Overall Cognitive Status: No family/caregiver present to determine baseline cognitive functioning  General Comments: pt with hx of Schizoaffective Disorder, though unclear true baseline as no visitors present. pt is appropriate and answers all questions. pt is oriented to loacation and situation and is able to give details about group home.    Physical Exam:  Blood pressure 126/87, pulse 101, temperature 99.2 F (37.3 C), temperature source Oral, resp. rate 10, height '6\' 2"'  (1.88 m), weight 87.8 kg (193 lb 9 oz), SpO2 100.00%.   Vitals reviewed.  HENT:  Head: Normocephalic.  Eyes: EOM are normal.  No nystagmus  Neck: Normal range of motion. Neck supple. No thyromegaly present.  Cardiovascular: Normal rate and regular rhythm. No murmurs, rubs Respiratory: Effort normal and breath sounds normal. No respiratory distress. No wheezes GI: Soft. Bowel sounds are normal. He exhibits no distension.  Neurological: He is alert.  Patient was pleasant during exam a good eye contact. Improve attention and awareness. Oriented to hospital, date. Follows all simple commands. Strength 4/5 UE's. LE's 3/5 HF, 4/5 knee and ankles. No gross sensory deficits, DTR's 1+, no tremors, resting tone. CN exam non-focal. Skin: Skin is warm and dry  Psych: pleasant and cooperative   Results for orders placed during the hospital encounter of 01/02/14 (from the past 48 hour(s))   GLUCOSE, CAPILLARY Status: None    Collection Time    01/12/14 12:37 PM   Result  Value  Ref Range    Glucose-Capillary  85  70 - 99 mg/dL   GLUCOSE, CAPILLARY Status: None    Collection Time    01/12/14 3:29 PM   Result  Value  Ref Range     Glucose-Capillary  82  70 - 99 mg/dL   GLUCOSE, CAPILLARY Status: None    Collection Time    01/12/14 7:16 PM   Result  Value  Ref Range    Glucose-Capillary  72  70 - 99 mg/dL   GLUCOSE, CAPILLARY Status: None    Collection Time    01/13/14 12:02 AM   Result  Value  Ref Range    Glucose-Capillary  80  70 - 99 mg/dL   BLOOD GAS, ARTERIAL Status: Abnormal    Collection Time    01/13/14 3:44 AM   Result  Value  Ref Range    FIO2  0.28     Delivery systems  NASAL CANNULA     pH, Arterial  7.320 (*)  7.350 - 7.450  pCO2 arterial  39.1  35.0 - 45.0 mmHg    pO2, Arterial  116.0 (*)  80.0 - 100.0 mmHg    Bicarbonate  19.6 (*)  20.0 - 24.0 mEq/L    TCO2  20.8  0 - 100 mmol/L    Acid-base deficit  5.5 (*)  0.0 - 2.0 mmol/L    O2 Saturation  98.3     Patient temperature  98.6     Collection site  RIGHT RADIAL     Drawn by  COLLECTED BY RT     Sample type  ARTERIAL DRAW     Allens test (pass/fail)  PASS  PASS   GLUCOSE, CAPILLARY Status: None    Collection Time    01/13/14 4:09 AM   Result  Value  Ref Range    Glucose-Capillary  76  70 - 99 mg/dL   COMPREHENSIVE METABOLIC PANEL Status: Abnormal    Collection Time    01/13/14 5:00 AM   Result  Value  Ref Range    Sodium  145  137 - 147 mEq/L    Potassium  3.9  3.7 - 5.3 mEq/L    Chloride  107  96 - 112 mEq/L    CO2  18 (*)  19 - 32 mEq/L    Glucose, Bld  75  70 - 99 mg/dL    BUN  30 (*)  6 - 23 mg/dL    Creatinine, Ser  3.33 (*)  0.50 - 1.35 mg/dL    Calcium  8.0 (*)  8.4 - 10.5 mg/dL    Total Protein  5.7 (*)  6.0 - 8.3 g/dL    Albumin  2.5 (*)  3.5 - 5.2 g/dL    AST  10  0 - 37 U/L    ALT  13  0 - 53 U/L    Alkaline Phosphatase  90  39 - 117 U/L    Total Bilirubin  0.3  0.3 - 1.2 mg/dL    GFR calc non Af Amer  21 (*)  >90 mL/min    GFR calc Af Amer  24 (*)  >90 mL/min    Comment:  (NOTE)     The eGFR has been calculated using the CKD EPI equation.     This calculation has not been validated in all clinical situations.      eGFR's persistently <90 mL/min signify possible Chronic Kidney     Disease.   CBC Status: Abnormal    Collection Time    01/13/14 5:00 AM   Result  Value  Ref Range    WBC  4.6  4.0 - 10.5 K/uL    RBC  2.67 (*)  4.22 - 5.81 MIL/uL    Hemoglobin  8.3 (*)  13.0 - 17.0 g/dL    HCT  23.9 (*)  39.0 - 52.0 %    MCV  89.5  78.0 - 100.0 fL    MCH  31.1  26.0 - 34.0 pg    MCHC  34.7  30.0 - 36.0 g/dL    RDW  12.0  11.5 - 15.5 %    Platelets  129 (*)  150 - 400 K/uL   MAGNESIUM Status: None    Collection Time    01/13/14 5:00 AM   Result  Value  Ref Range    Magnesium  1.8  1.5 - 2.5 mg/dL   PHOSPHORUS Status: None    Collection Time    01/13/14 5:00 AM   Result  Value  Ref Range    Phosphorus  4.5  2.3 - 4.6 mg/dL   GLUCOSE, CAPILLARY Status: None    Collection Time    01/13/14 8:14 AM   Result  Value  Ref Range    Glucose-Capillary  77  70 - 99 mg/dL   GLUCOSE, CAPILLARY Status: None    Collection Time    01/13/14 11:46 AM   Result  Value  Ref Range    Glucose-Capillary  85  70 - 99 mg/dL   BASIC METABOLIC PANEL Status: Abnormal    Collection Time    01/14/14 5:53 AM   Result  Value  Ref Range    Sodium  147  137 - 147 mEq/L    Potassium  3.7  3.7 - 5.3 mEq/L    Chloride  110  96 - 112 mEq/L    CO2  24  19 - 32 mEq/L    Glucose, Bld  99  70 - 99 mg/dL    BUN  30 (*)  6 - 23 mg/dL    Creatinine, Ser  2.92 (*)  0.50 - 1.35 mg/dL    Calcium  8.4  8.4 - 10.5 mg/dL    GFR calc non Af Amer  24 (*)  >90 mL/min    GFR calc Af Amer  28 (*)  >90 mL/min    Comment:  (NOTE)     The eGFR has been calculated using the CKD EPI equation.     This calculation has not been validated in all clinical situations.     eGFR's persistently <90 mL/min signify possible Chronic Kidney     Disease.   CK Status: None    Collection Time    01/14/14 5:53 AM   Result  Value  Ref Range    Total CK  114  7 - 232 U/L   CBC Status: Abnormal    Collection Time    01/14/14 5:53 AM   Result  Value   Ref Range    WBC  4.4  4.0 - 10.5 K/uL    RBC  2.77 (*)  4.22 - 5.81 MIL/uL    Hemoglobin  8.6 (*)  13.0 - 17.0 g/dL    HCT  24.6 (*)  39.0 - 52.0 %    MCV  88.8  78.0 - 100.0 fL    MCH  31.0  26.0 - 34.0 pg    MCHC  35.0  30.0 - 36.0 g/dL    RDW  12.1  11.5 - 15.5 %    Platelets  168  150 - 400 K/uL    Comment:  DELTA CHECK NOTED     REPEATED TO VERIFY    Dg Chest Port 1 View  01/13/2014 CLINICAL DATA: Congestion. EXAM: PORTABLE CHEST - 1 VIEW COMPARISON: Single view of the chest 01/12/2014 and 01/11/2014. FINDINGS: Endotracheal tube has been removed. Left IJ catheter and NG tube remain in place. Lungs are clear. Heart size is normal. No pneumothorax or pleural effusion. IMPRESSION: Status post extubation. Lungs are clear. Electronically Signed By: Inge Rise M.D. On: 01/13/2014 07:43   Post Admission Physician Evaluation:  1. Functional deficits secondary to metabolic toxic/encephalopathy. 2. Patient is admitted to receive collaborative, interdisciplinary care between the physiatrist, rehab nursing staff, and therapy team. 3. Patient's level of medical complexity and substantial therapy needs in context of that medical necessity cannot be provided at a lesser intensity of care such as a SNF. 4. Patient has experienced substantial functional loss  from his/her baseline which was documented above under the "Functional History" and "Functional Status" headings. Judging by the patient's diagnosis, physical exam, and functional history, the patient has potential for functional progress which will result in measurable gains while on inpatient rehab. These gains will be of substantial and practical use upon discharge in facilitating mobility and self-care at the household level. 5. Physiatrist will provide 24 hour management of medical needs as well as oversight of the therapy plan/treatment and provide guidance as appropriate regarding the interaction of the two. 6. 24 hour rehab nursing will  assist with bladder management, bowel management, safety, skin/wound care, disease management, medication administration, pain management and patient education and help integrate therapy concepts, techniques,education, etc. 7. PT will assess and treat for/with: Lower extremity strength, range of motion, stamina, balance, functional mobility, safety, adaptive techniques and equipment, education,NMR. Goals are: mod I. 8. OT will assess and treat for/with: ADL's, functional mobility, safety, upper extremity strength, adaptive techniques and equipment, NMR, education. Goals are: mod I. 9. SLP will assess and treat for/with: cognition, communication. Goals are: mod I. 10. Case Management and Social Worker will assess and treat for psychological issues and discharge planning. 11. Team conference will be held weekly to assess progress toward goals and to determine barriers to discharge. 12. Patient will receive at least 3 hours of therapy per day at least 5 days per week. 13. ELOS: 8 - 9 days  14. Prognosis: excellent Medical Problem List and Plan:  1. Metabolic toxic encephalopathy  2. DVT Prophylaxis/Anticoagulation: Subcutaneous heparin. Monitor platelet counts and any signs of bleeding  3. Pain Management: Tylenol as needed  4. Mood/schizoaffective disorder. Cogentin 2 mg each bedtime,Clozaril 100 mg twice a day and 400 mg each bedtime, Effexor 75 mg twice a day.  5. Neuropsych: This patient is capable of making decisions on his own behalf.  6. Seizure disorder. Keppra 500 mg twice a day. EEG negative  7. Acute renal insufficiency. Last hemodialysis 01/04/2014. Creatinine improved to 2.92  8. Hyperlipidemia. Lipitor  9. Dysphagia. Dysphagia 2 nectar liquids. Monitor for signs of aspiration. Followup speech therapy   Meredith Staggers, MD, [facility]   01/14/2014

## 2014-01-18 ENCOUNTER — Inpatient Hospital Stay (HOSPITAL_COMMUNITY): Payer: Medicare Other

## 2014-01-18 ENCOUNTER — Inpatient Hospital Stay (HOSPITAL_COMMUNITY): Payer: Medicare Other | Admitting: Speech Pathology

## 2014-01-18 ENCOUNTER — Inpatient Hospital Stay (HOSPITAL_COMMUNITY): Payer: Medicare Other | Admitting: Occupational Therapy

## 2014-01-18 DIAGNOSIS — G92 Toxic encephalopathy: Secondary | ICD-10-CM

## 2014-01-18 DIAGNOSIS — R296 Repeated falls: Secondary | ICD-10-CM

## 2014-01-18 DIAGNOSIS — N289 Disorder of kidney and ureter, unspecified: Secondary | ICD-10-CM

## 2014-01-18 DIAGNOSIS — R1310 Dysphagia, unspecified: Secondary | ICD-10-CM

## 2014-01-18 DIAGNOSIS — G929 Unspecified toxic encephalopathy: Secondary | ICD-10-CM

## 2014-01-18 DIAGNOSIS — R561 Post traumatic seizures: Secondary | ICD-10-CM

## 2014-01-18 LAB — CBC WITH DIFFERENTIAL/PLATELET
BASOS ABS: 0.1 10*3/uL (ref 0.0–0.1)
BASOS PCT: 1 % (ref 0–1)
EOS ABS: 0.4 10*3/uL (ref 0.0–0.7)
EOS PCT: 8 % — AB (ref 0–5)
HEMATOCRIT: 25.6 % — AB (ref 39.0–52.0)
HEMOGLOBIN: 8.8 g/dL — AB (ref 13.0–17.0)
Lymphocytes Relative: 27 % (ref 12–46)
Lymphs Abs: 1.4 10*3/uL (ref 0.7–4.0)
MCH: 30.9 pg (ref 26.0–34.0)
MCHC: 34.4 g/dL (ref 30.0–36.0)
MCV: 89.8 fL (ref 78.0–100.0)
MONO ABS: 0.4 10*3/uL (ref 0.1–1.0)
MONOS PCT: 8 % (ref 3–12)
Neutro Abs: 3 10*3/uL (ref 1.7–7.7)
Neutrophils Relative %: 56 % (ref 43–77)
Platelets: 192 10*3/uL (ref 150–400)
RBC: 2.85 MIL/uL — ABNORMAL LOW (ref 4.22–5.81)
RDW: 12 % (ref 11.5–15.5)
WBC: 5.3 10*3/uL (ref 4.0–10.5)

## 2014-01-18 LAB — COMPREHENSIVE METABOLIC PANEL
ALBUMIN: 2.9 g/dL — AB (ref 3.5–5.2)
ALT: 11 U/L (ref 0–53)
AST: 10 U/L (ref 0–37)
Alkaline Phosphatase: 104 U/L (ref 39–117)
BUN: 29 mg/dL — AB (ref 6–23)
CALCIUM: 8.4 mg/dL (ref 8.4–10.5)
CO2: 23 mEq/L (ref 19–32)
CREATININE: 2.46 mg/dL — AB (ref 0.50–1.35)
Chloride: 104 mEq/L (ref 96–112)
GFR calc Af Amer: 34 mL/min — ABNORMAL LOW (ref >90)
GFR calc non Af Amer: 30 mL/min — ABNORMAL LOW (ref >90)
Glucose, Bld: 108 mg/dL — ABNORMAL HIGH (ref 70–99)
Potassium: 4.5 mEq/L (ref 3.7–5.3)
Sodium: 142 mEq/L (ref 137–147)
TOTAL PROTEIN: 6.3 g/dL (ref 6.0–8.3)
Total Bilirubin: <0.2 mg/dL — ABNORMAL LOW (ref 0.3–1.2)

## 2014-01-18 MED ORDER — POLYETHYLENE GLYCOL 3350 17 G PO PACK
17.0000 g | PACK | Freq: Every day | ORAL | Status: DC
  Administered 2014-01-18 – 2014-01-21 (×4): 17 g via ORAL
  Filled 2014-01-18 (×5): qty 1

## 2014-01-18 NOTE — Progress Notes (Signed)
Occupational Therapy Session Note  Patient Details  Name: Brady Hoffman MRN: [id number] Date of Birth: 1966/04/30  Today's Date: 01/18/2014 Time: 1300-1330 Time Calculation (min): 30 min  Short Term Goals: Week 1:   STG=LTG due to LOS  Skilled Therapeutic Interventions/Progress Updates:    Pt was in bed upon arrival.  Pt transferred from supine to sitting EOB and then to standing with close supervision.  Pt ambulated w/o AD to bathroom and completed toileting routine with steady A.  Pt ambulated to therapy gym w/o AD and with steady A and then participated in therapeutic activity of tossing object back and forth while working on dynamic standing balance and then ambulation. Pt required min A during activity and was able to complete activity ambulating in various directions.  Pt participated in balance activity of a side step, and was able to complete activity with R  lead, but had difficulty with L LE lead.  Pt ambulated back to hospital room with min A and completed cognitive activities of reading numbers on wall while ambulating.  Pt has two small LOB with recovery.  Pt had no c/o pain.  Focus on: Functional ambulation, dynamic standing balance, hand eye coordination and divided attention during activities.    Therapy Documentation Precautions:  Precautions Precautions: Fall Precaution Comments: Seizure Precautions Restrictions Weight Bearing Restrictions: No  See FIM for current functional status  Therapy/Group: Individual Therapy  Worthy Boschert 01/18/2014, 1:44 PM

## 2014-01-18 NOTE — Evaluation (Signed)
Occupational Therapy Assessment and Plan  Patient Details  Name: Brady Hoffman MRN: [id number] Date of Birth: 1966-08-21  OT Diagnosis: muscle weakness (generalized) Rehab Potential: Rehab Potential: Excellent ELOS: 6   Today's Date: 01/18/2014 Time: 0800-0900 Time Calculation (min): 60 min  Problem List:  Patient Active Problem List   Diagnosis Date Noted  . Encephalopathy 01/17/2014  . Anemia 01/14/2014  . Status epilepticus 01/02/2014  . Altered mental status 01/02/2014  . Metabolic acidosis 26/83/4196  . Acute respiratory failure 01/02/2014  . Schizoaffective disorder 10/31/2011  . Acute renal failure 10/31/2011  . Dyslipidemia 10/31/2011    Past Medical History:  Past Medical History  Diagnosis Date  . Hyperlipidemia   . Schizoaffective disorder   . H/O: GI bleed   . Seizures   . Depression   . Chronic kidney disease   . Anemia 01/14/2014   Past Surgical History:  Past Surgical History  Procedure Laterality Date  . Mole removal    . Wisdom tooth extraction    . Esophagogastroduodenoscopy  11/01/2011    Procedure: ESOPHAGOGASTRODUODENOSCOPY (EGD);  Surgeon: Beryle Beams;  Location: [facility];  Service: Endoscopy;  Laterality: N/A;    Assessment & Plan Clinical Impression: 48 y.o. right-handed male with hx of schizoaffective disorder resident of a group home x12 years. Admitted 01/02/2014 after a fall struck his head from ground level. Patient without complaints then developed seizure-like activity and mental status changes. Patient was intubated in the emergency department. Imagining and drug screens negative. Neurology services consulted for noted seizure with EEG showing pattern consistent with normal sleep no evidence of encephalopathic process seen. Noted dysphagia with diet advanced to a dysphagia 2 nectar liquids. Patient was extubated 02/1. Patient did require hemodialysis for a short time with last dialysis 02/03 with latest creatinine 2.92  Patient  transferred to [place] on 01/17/2014 .    Patient currently requires supervision with basic self-care skills secondary to muscle weakness and decreased balance strategies.  Prior to hospitalization, patient could complete independent with modified independent .  Patient will benefit from skilled intervention to increase independence with basic self-care skills prior to discharge home independently and group home staff.  Anticipate patient will require no assistance with alds and no further OT follow recommended.  8:00-9:00AM  Pt seen for initial evaluation and completed sink level bathing/ grooming. Pt supine on arrival with name call for arousal. Pt oriented x4 and recalling all new information during session. Pt static standing to void bladder at toilet with use of Rt grab bar. Pt static standing at sink initially for shaving and required return to sitting due to fatigue. Pt demonstrates adequate attention levels for grooming task. Pt static standing single leg without UB support to doff boxers and don boxers/ pants. Pt opening all breakfast tray items without need for assistance. Pt ambulating in hallway with no changes in speed for gait and changes in gait with head movement. Question current sequencing challenges baseline.   OT - End of Session Activity Tolerance: Improving Endurance Deficit: Yes Endurance Deficit Description: required sitting for shaving on eval, rest breaks with shaving for UB fatigue OT Assessment Rehab Potential: Excellent OT Patient demonstrates impairments in the following area(s): Balance;Endurance OT Basic ADL's Functional Problem(s): Bathing;Grooming;Dressing;Toileting OT Transfers Functional Problem(s): Tub/Shower;Toilet OT Additional Impairment(s): None OT Plan OT Intensity: Minimum of 1-2 x/day, 45 to 90 minutes OT Frequency: 5 out of 7 days OT Duration/Estimated Length of Stay: 6 OT Treatment/Interventions: Balance/vestibular training;Community  reintegration;Discharge planning;Patient/family education;Self Care/advanced ADL retraining;Psychosocial support;Therapeutic  Activities;Therapeutic Exercise OT Self Feeding Anticipated Outcome(s): mod i OT Basic Self-Care Anticipated Outcome(s): mod i OT Toileting Anticipated Outcome(s): modi OT Bathroom Transfers Anticipated Outcome(s): mod i OT Recommendation Patient destination:  (group home) Follow Up Recommendations: None Equipment Recommended: None recommended by OT   Skilled Therapeutic Intervention   OT Evaluation Precautions/Restrictions  Precautions Precautions: Fall Precaution Comments: Seizure Precautions Restrictions Weight Bearing Restrictions: No    Home Living/Prior Functioning Home Living Family/patient expects to be discharged to:: Group home Available Help at Discharge: Available PRN/intermittently Type of Home: Group Home Home Access: Level entry Home Layout: One level Additional Comments: pt indicates he is ambulatory and that the group home staff performed all homemaking tasks for residents.    Lives With: Other (Comment) (Group home- lives with a roommate) IADL History Homemaking Responsibilities: No Current License: No Mode of Transportation: Other (comment);Family (mother drive patient) Education: high school, [employer] in [place] Occupation: On disability Prior Function Level of Independence: Independent with gait;Independent with transfers;Independent with basic ADLs;Needs assistance with homemaking  Able to Take Stairs?: Yes Driving: No Leisure: Hobbies-yes (Comment) Comments: Read the newspaper, watch movies ADL ADL ADL Comments: see FIM Vision/Perception  Vision - History Baseline Vision: Wears glasses for distance only (but reports he doesnt wear them) Vision - Assessment Eye Alignment: Within Functional Limits Perception Perception: Within Functional Limits Praxis Praxis: Intact  Cognition Overall Cognitive  Status: History of cognitive impairments - at baseline Arousal/Alertness: Awake/alert Orientation Level: Oriented X4 Selective Attention: Appears intact Alternating Attention: Appears intact Memory: Appears intact Awareness: Impaired Awareness Impairment: Emergent impairment Problem Solving: Appears intact Sequencing: Impaired Sequencing Impairment: Functional basic Self Correcting: Impaired Self Correcting Impairment: Functional basic Safety/Judgment: Appears intact Comments: Spoke with patient's mother via telephone. She reports she feels patient as at his cognitive baseline at this time.  Sensation Sensation Light Touch: Appears Intact Coordination Gross Motor Movements are Fluid and Coordinated: Yes Fine Motor Movements are Fluid and Coordinated: Yes Motor  Motor Motor: Within Functional Limits Mobility  Bed Mobility Bed Mobility: Supine to Sit;Sitting - Scoot to Edge of Bed Supine to Sit: 6: Modified independent (Device/Increase time);HOB flat Sitting - Scoot to Edge of Bed: 6: Modified independent (Device/Increase time) Sit to Supine: 6: Modified independent (Device/Increase time) Transfers Sit to Stand: 5: Supervision Sit to Stand Details: Tactile cues for weight shifting Stand to Sit: 5: Supervision Stand to Sit Details (indicate cue type and reason): Tactile cues for weight shifting  Trunk/Postural Assessment  Cervical Assessment Cervical Assessment: Within Functional Limits Thoracic Assessment Thoracic Assessment: Within Functional Limits Lumbar Assessment Lumbar Assessment: Within Functional Limits Postural Control Postural Control: Within Functional Limits  Balance Balance Balance Assessed: Yes Dynamic Gait Index Level Surface: Mild Impairment Change in Gait Speed: Moderate Impairment Gait with Horizontal Head Turns: Mild Impairment Gait with Vertical Head Turns: Mild Impairment Gait and Pivot Turn: Mild Impairment Extremity/Trunk Assessment RUE  Assessment RUE Assessment: Within Functional Limits LUE Assessment LUE Assessment: Within Functional Limits  FIM:  FIM - Eating Eating Activity: 5: Supervision/cues FIM - Grooming Grooming Steps: Wash, rinse, dry face;Wash, rinse, dry hands;Shave or apply make-up Grooming: 5: Set-up assist to obtain items FIM - Bathing Bathing Steps Patient Completed: Chest;Right Arm;Left Arm;Abdomen;Front perineal area;Buttocks Bathing: 5: Set-up assist to: Obtain items FIM - Upper Body Dressing/Undressing Upper body dressing/undressing steps patient completed: Thread/unthread right sleeve of pullover shirt/dresss;Thread/unthread left sleeve of pullover shirt/dress;Put head through opening of pull over shirt/dress;Pull shirt over trunk Upper body dressing/undressing: 5: Set-up assist to:  Obtain clothing/put away FIM - Lower Body Dressing/Undressing Lower body dressing/undressing steps patient completed: Thread/unthread right underwear leg;Thread/unthread left underwear leg;Pull underwear up/down;Thread/unthread right pants leg;Thread/unthread left pants leg;Pull pants up/down Lower body dressing/undressing: 5: Set-up assist to: Obtain clothing FIM - Toileting Toileting steps completed by patient: Adjust clothing prior to toileting;Performs perineal hygiene;Adjust clothing after toileting Toileting: 4: Steadying assist FIM - Air cabin crew Transfers: 4-To toilet/BSC: Min A (steadying Pt. > 75%);4-From toilet/BSC: Min A (steadying Pt. > 75%)   Refer to Care Plan for Long Term Goals  Recommendations for other services: None  Discharge Criteria: Patient will be discharged from OT if patient refuses treatment 3 consecutive times without medical reason, if treatment goals not met, if there is a change in medical status, if patient makes no progress towards goals or if patient is discharged from hospital.  The above assessment, treatment plan, treatment alternatives and goals were discussed and  mutually agreed upon: by patient  Peri Maris 01/18/2014, 2:36 PM  Pager: [phone]

## 2014-01-18 NOTE — Progress Notes (Signed)
Patient information reviewed and entered into eRehab system by Mccartney Chuba, RN, CRRN, PPS Coordinator.  Information including medical coding and functional independence measure will be reviewed and updated through discharge.     Per nursing patient was given "Data Collection Information Summary for Patients in Inpatient Rehabilitation Facilities with attached "Privacy Act Statement-Health Care Records" upon admission.  

## 2014-01-18 NOTE — Evaluation (Signed)
Speech Language Pathology Assessment and Plan  Patient Details  Name: Brady Hoffman MRN: [id number] Date of Birth: Apr 17, 1966  SLP Diagnosis: Dysphagia  Rehab Potential: Excellent ELOS: 3-5 days   Today's Date: 01/18/2014 Time: 5830-9407 Time Calculation (min): 55 min  Problem List:  Patient Active Problem List   Diagnosis Date Noted  . Encephalopathy 01/17/2014  . Anemia 01/14/2014  . Status epilepticus 01/02/2014  . Altered mental status 01/02/2014  . Metabolic acidosis 68/07/8109  . Acute respiratory failure 01/02/2014  . Schizoaffective disorder 10/31/2011  . Acute renal failure 10/31/2011  . Dyslipidemia 10/31/2011   Past Medical History:  Past Medical History  Diagnosis Date  . Hyperlipidemia   . Schizoaffective disorder   . H/O: GI bleed   . Seizures   . Depression   . Chronic kidney disease   . Anemia 01/14/2014   Past Surgical History:  Past Surgical History  Procedure Laterality Date  . Mole removal    . Wisdom tooth extraction    . Esophagogastroduodenoscopy  11/01/2011    Procedure: ESOPHAGOGASTRODUODENOSCOPY (EGD);  Surgeon: Beryle Beams;  Location: [facility];  Service: Endoscopy;  Laterality: N/A;    Assessment / Plan / Recommendation Clinical Impression Patient is a 48 y.o. right-handed male with history of schizoaffective disorder resident of a group home x12 years. Admitted 01/02/2014 after a fall in which he struck his head from ground level. Patient without complaints then developed seizure-like activity and mental status changes. Patient was intubated in the emergency department. Cranial CT scan negative without acute abnormalities. MRI of the brain negative. CT abdomen and pelvis unremarkable. Urine drug screen negative. Chemistries revealed a creatinine of 20.63 from a baseline of 1.09 with BUN 130 potassium 5.7. Nephrology services consulted placed on IV fluids. Renal ultrasound negative for hydronephrosis. Neurology services consulted for  noted seizure with EEG showing pattern consistent with normal sleep no evidence of encephalopathic process seen. Patient maintained on Keppra therapy. Subcutaneous heparin for DVT prophylaxis. Patient was extubated 01/12/2014. Noted dysphagia with patient initially on dysphagia 2 textures with nectar liquids which was upgraded to regular textures with thin liquids on 01/17/14. Patient did require hemodialysis for a short time with last dialysis 01/04/2014 with latest creatinine 2.92. Physical therapy evaluation completed 01/12/2014 with recommendations for physical medicine rehabilitation consult. Admitted for comprehensive rehabilitation program on 01/17/14 and administered a cognitive-linguistic evaluation and BSE.  Pt appears to be at his cognitive-linguistic baseline per his report and his mother's report via telephone.  Pt consumed thin liquids via straw without overt s/s of aspiration and demonstrated cough X 1 with regular textures today during snack, pt is impulsive with self-feeding (at baseline) bur suspect cough was due to GERD with constant belching after the snack. Pt's mother also reports coughing with dinner meal on 01/17/14. Recommend pt continue current diet of regular textures with thin liquids with full supervision to increase utilization of small bites and a slow pace. Pt would benefit from skilled SLP intervention to maximize swallowing functioning and safety with current diet.   Skilled Therapeutic Interventions          Administered cognitive-linguistic evaluation and BSE. Please see above for details. Pt and his mother (via phone) was educated on pt's current cognitive-linguistic and swallowing function. Pt's mother reported she felt pt was at his cognitive baseline. SLP will f/u for tolerance of recently upgraded textures. Both the pt and his mother verbalized understanding of all information.   SLP Assessment  Patient will need skilled [place] Pathology  Services during CIR  admission    Recommendations  Diet Recommendations: Thin liquid;Regular Liquid Administration via: Cup;Straw Medication Administration: Whole meds with liquid Supervision: Patient able to self feed;Full supervision/cueing for compensatory strategies Compensations: Slow rate;Small sips/bites Postural Changes and/or Swallow Maneuvers: Seated upright 90 degrees Oral Care Recommendations: Oral care BID Patient destination:  (group home) Follow up Recommendations: None Equipment Recommended: None recommended by SLP    SLP Frequency 5 out of 7 days   SLP Treatment/Interventions Cueing hierarchy;Dysphagia/aspiration precaution training;Environmental controls;Therapeutic Activities;Patient/family education;Functional tasks    Pain No/Denies Pain  Short Term Goals: Week 1: SLP Short Term Goal 1 (Week 1): Pt will recall current swallowing compensatory strategies of small bites and a slow pace with Mod I.  SLP Short Term Goal 2 (Week 1): Pt will consume current diet with minimal overt s/s of aspiration with Mod I for utilization of swallowing compensatory strategies.   See FIM for current functional status Refer to Care Plan for Long Term Goals  Recommendations for other services: None  Discharge Criteria: Patient will be discharged from SLP if patient refuses treatment 3 consecutive times without medical reason, if treatment goals not met, if there is a change in medical status, if patient makes no progress towards goals or if patient is discharged from hospital.  The above assessment, treatment plan, treatment alternatives and goals were discussed and mutually agreed upon: by patient and by family  Carmelo Reidel 01/18/2014, 2:15 PM

## 2014-01-18 NOTE — Evaluation (Signed)
Physical Therapy Assessment and Plan  Patient Details  Name: Brady Hoffman MRN: [id number] Date of Birth: 1965/12/10  PT Diagnosis: Abnormality of gait and Impaired cognition, Decreased functional endurance, Decreased dynamic standing balance Rehab Potential: Good ELOS: 5-7days   Today's Date: 01/18/2014 Time: 7408-1448 Time Calculation (min): 55 min  Problem List:  Patient Active Problem List   Diagnosis Date Noted  . Encephalopathy 01/17/2014  . Anemia 01/14/2014  . Status epilepticus 01/02/2014  . Altered mental status 01/02/2014  . Metabolic acidosis 18/56/3149  . Acute respiratory failure 01/02/2014  . Schizoaffective disorder 10/31/2011  . Acute renal failure 10/31/2011  . Dyslipidemia 10/31/2011    Past Medical History:  Past Medical History  Diagnosis Date  . Hyperlipidemia   . Schizoaffective disorder   . H/O: GI bleed   . Seizures   . Depression   . Chronic kidney disease   . Anemia 01/14/2014   Past Surgical History:  Past Surgical History  Procedure Laterality Date  . Mole removal    . Wisdom tooth extraction    . Esophagogastroduodenoscopy  11/01/2011    Procedure: ESOPHAGOGASTRODUODENOSCOPY (EGD);  Surgeon: Beryle Beams;  Location: [facility];  Service: Endoscopy;  Laterality: N/A;    Assessment & Plan Clinical Impression: Brady Hoffman is a 48 y.o. right-handed male with history of schizoaffective disorder resident of a group home x12 years. Admitted 01/02/2014 after a fall struck his head from ground level. Patient without complaints then developed seizure-like activity and mental status changes. Patient was intubated in the emergency department. Cranial CT scan cervical CT spine negative without acute abnormalities. MRI of the brain negative. CT abdomen and pelvis unremarkable. Urine drug screen negative. Chemistries revealed a creatinine of 20.63 from a baseline of 1.09 with BUN 130 potassium 5.7. Nephrology services consulted placed on IV  fluids. Renal ultrasound negative for hydronephrosis. Neurology services consulted for noted seizure with EEG showing pattern consistent with normal sleep no evidence of encephalopathic process seen. Patient maintained on Keppra therapy. Subcutaneous heparin for DVT prophylaxis. Noted dysphagia with diet advanced to a dysphagia 2 nectar liquids. Patient was extubated 01/12/2014. Patient did require hemodialysis for a short time with last dialysis 01/04/2014 with latest creatinine 2.92. Physical therapy evaluation completed 01/12/2014 with recommendations for physical medicine rehabilitation consult. Patient transferred to CIR on 01/17/2014 .   Patient currently requires min with mobility secondary to decreased proprioception, decreased awareness, decreased functional endurnace and decreased standing balance.  Prior to hospitalization, patient was independent  with mobility and lived with Other (Comment) (Group home- lives with a roommate) in a [place] home.  Home access is  Level entry.  Patient will benefit from skilled PT intervention to maximize safe functional mobility, minimize fall risk and decrease caregiver burden for planned discharge home with intermittent assist.  Anticipate patient will not need PT follow up at discharge.  PT - End of Session Activity Tolerance: Tolerates 30+ min activity without fatigue Endurance Deficit: Yes PT Assessment Rehab Potential: Good PT Patient demonstrates impairments in the following area(s): Endurance;Balance;Safety;Other (comment) (proprioception) PT Transfers Functional Problem(s): Bed Mobility;Bed to Chair;Car;Furniture PT Locomotion Functional Problem(s): Stairs;Ambulation PT Plan PT Intensity: Minimum of 1-2 x/day ,45 to 90 minutes PT Frequency: 5 out of 7 days PT Duration Estimated Length of Stay: 5-7days PT Treatment/Interventions: Training and development officer;Ambulation/gait training;Cognitive remediation/compensation;Functional mobility  training;Patient/family education;Therapeutic Exercise;UE/LE Strength taining/ROM;Stair training;Neuromuscular re-education;Visual/perceptual remediation/compensation;UE/LE Coordination activities;Therapeutic Activities;DME/adaptive equipment instruction;Community reintegration;Discharge planning;Disease management/prevention PT Transfers Anticipated Outcome(s): Mod(I) PT Locomotion Anticipated Outcome(s): Mod(I) PT Recommendation Follow Up Recommendations:  None Patient destination: Home Equipment Recommended: None recommended by PT  Skilled Therapeutic Intervention 1:1. Pt received sitting in recliner, ready for therapy. PT evaluation completed, see detailed objective information below. Tx initiated w/ emphasis on gait training, balance and functional transfers. Pt demonstrates good intellectual awareness of decreased balance and proprioception in B LE, but difficulty correcting as noted by drifting side to side and variable BOS during amb. Pt consistently cued to "look up" throughout session so as not to look at feet uncertain if new vs. baseline. Pt challenged w/ speed changes during amb (unable to demonstrate significant difference), backwards walking and [employer] performed. Pt scored 44/56 points, educated on results. Pt cognitively challenged w/ pathfinding task at end of session to find room from [place] zone, more dependent on memory vs. Use of signs and min cues. Pt sitting in recliner at end of session w/ all needs in reach and quick release belt in place.   PT Evaluation Precautions/Restrictions Precautions Precautions: Fall Precaution Comments: Seizure Precautions Restrictions Weight Bearing Restrictions: No General Chart Reviewed: Yes Family/Caregiver Present: No Vital Signs  Pain Pain Assessment Pain Assessment: No/denies pain Pain Score: 0-No pain Home Living/Prior Functioning Home Living Available Help at Discharge: Available PRN/intermittently Type of Home: Group  Home Home Access: Level entry Home Layout: One level Additional Comments: pt indicates he is ambulatory and that the group home staff performed all homemaking tasks for residents.    Lives With: Other (Comment) (Group home- lives with a roommate) Prior Function Level of Independence: Independent with gait;Independent with transfers;Independent with basic ADLs;Needs assistance with homemaking  Able to Take Stairs?: Yes Driving: No Leisure: Hobbies-yes (Comment) Comments: Read the newspaper, watch movies Vision/Perception  Vision - History Baseline Vision: No visual deficits Patient Visual Report: Blurring of vision (mild) Perception Perception: Within Functional Limits Praxis Praxis: Intact  Cognition Overall Cognitive Status: History of cognitive impairments - at baseline Arousal/Alertness: Awake/alert Orientation Level: Oriented X4 Attention: Alternating;Selective Selective Attention: Appears intact Alternating Attention: Appears intact Memory: Appears intact Awareness: Impaired Awareness Impairment: Emergent impairment Problem Solving: Appears intact Executive Function: Sequencing;Self Correcting Sequencing: Impaired Sequencing Impairment: Functional basic Self Correcting: Impaired Self Correcting Impairment: Functional basic Safety/Judgment: Appears intact Comments: Spoke with patient's mother via telephone. She reports she feels patient as at his cognitive baseline at this time.  Sensation Sensation Light Touch: Appears Intact Proprioception: Impaired by gross assessment Coordination Gross Motor Movements are Fluid and Coordinated: Yes Fine Motor Movements are Fluid and Coordinated: Yes Motor  Motor Motor: Within Functional Limits  Mobility Bed Mobility Bed Mobility: Sit to Supine;Supine to Sit Supine to Sit: 6: Modified independent (Device/Increase time) Sit to Supine: 6: Modified independent (Device/Increase time) Transfers Transfers: Yes Sit to Stand: 4:  Min guard;4: Min assist;5: Supervision Sit to Stand Details: Tactile cues for weight shifting Stand to Sit: 5: Supervision;4: Min guard;4: Min assist Stand to Sit Details (indicate cue type and reason): Tactile cues for weight shifting Stand Pivot Transfers: 4: Min guard;5: Supervision;4: Min Psychologist, occupational Details: Tactile cues for weight shifting Locomotion  Ambulation Ambulation: Yes Ambulation/Gait Assistance: 5: Supervision;4: Min assist;4: Min guard Ambulation Distance (Feet): 200 Feet Assistive device: Other (Comment) (gait belt) Ambulation/Gait Assistance Details: Tactile cues for weight shifting;Other (comment) (verbal cues to keep head up) Ambulation/Gait Assistance Details: Pt w/ steady reciprocal gait pattern, however, hard B weight shifts and drifts side to side Gait Gait: Yes Gait Pattern: Step-through pattern;Wide base of support;Narrow base of support Gait velocity: decreased, unable to change  speed and sustain; looks down but states that he knows he needs to look up High Level Ambulation High Level Ambulation: Backwards walking Backwards Walking: cues for larger steps, min guard A w/ no Lob Stairs / Additional Locomotion Stairs: Yes Stairs Assistance: 4: Min guard Stairs Assistance Details: Verbal cues for precautions/safety Stair Management Technique: One rail Right;One rail Left Number of Stairs: 8 Wheelchair Mobility Wheelchair Mobility: No (Pt at ambulatory level)  Trunk/Postural Assessment  Cervical Assessment Cervical Assessment: Within Functional Limits Thoracic Assessment Thoracic Assessment: Within Functional Limits Lumbar Assessment Lumbar Assessment: Within Functional Limits Postural Control Postural Control: Within Functional Limits  Balance Balance Balance Assessed: Yes Standardized Balance Assessment Standardized Balance Assessment: Berg Balance Test Berg Balance Test Sit to Stand: Able to stand without using hands and stabilize  independently Standing Unsupported: Able to stand 2 minutes with supervision Sitting with Back Unsupported but Feet Supported on Floor or Stool: Able to sit safely and securely 2 minutes Stand to Sit: Sits safely with minimal use of hands Transfers: Able to transfer safely, minor use of hands Standing Unsupported with Eyes Closed: Able to stand 10 seconds with supervision Standing Ubsupported with Feet Together: Able to place feet together independently and stand for 1 minute with supervision From Standing, Reach Forward with Outstretched Arm: Can reach confidently >25 cm (10") From Standing Position, Pick up Object from Floor: Able to pick up shoe, needs supervision From Standing Position, Turn to Look Behind Over each Shoulder: Looks behind one side only/other side shows less weight shift Turn 360 Degrees: Able to turn 360 degrees safely but slowly Standing Unsupported, Alternately Place Feet on Step/Stool: Able to stand independently and complete 8 steps >20 seconds Standing Unsupported, One Foot in Front: Able to plae foot ahead of the other independently and hold 30 seconds Standing on One Leg: Tries to lift leg/unable to hold 3 seconds but remains standing independently Total Score: 44 Static Sitting Balance Static Sitting - Balance Support: No upper extremity supported;Feet unsupported Static Sitting - Level of Assistance: 6: Modified independent (Device/Increase time) Dynamic Sitting Balance Dynamic Sitting - Balance Support: Left upper extremity supported;Right upper extremity supported;Feet unsupported Dynamic Sitting - Level of Assistance: 5: Stand by assistance Dynamic Sitting - Balance Activities: Lateral lean/weight shifting;Forward lean/weight shifting;Reaching for objects Static Standing Balance Static Standing - Balance Support: No upper extremity supported Static Standing - Level of Assistance: 4: Min assist Dynamic Standing Balance Dynamic Standing - Balance Support: No  upper extremity supported;Left upper extremity supported;Right upper extremity supported;During functional activity Dynamic Standing - Level of Assistance: 4: Min assist;3: Mod assist Dynamic Standing - Balance Activities: Lateral lean/weight shifting;Forward lean/weight shifting;Reaching for objects;Reaching across midline Extremity Assessment      RLE Assessment RLE Assessment: Within Functional Limits (Grossly 4+/5) LLE Assessment LLE Assessment: Within Functional Limits (Grossly 4+/5)  FIM:  FIM - Bed/Chair Transfer Bed/Chair Transfer: 6: Sit > Supine: No assist;6: Supine > Sit: No assist;4: Bed > Chair or W/C: Min A (steadying Pt. > 75%);4: Chair or W/C > Bed: Min A (steadying Pt. > 75%) FIM - Locomotion: Wheelchair Locomotion: Wheelchair: 0: Activity did not occur (Pt at ambulatory level) FIM - Locomotion: Ambulation Locomotion: Ambulation Assistive Devices: Other (comment) (gait belt) Ambulation/Gait Assistance: 5: Supervision;4: Min assist;4: Min guard Locomotion: Ambulation: 4: Travels 150 ft or more with minimal assistance (Pt.>75%) FIM - Locomotion: Stairs Locomotion: Stairs Assistive Devices: Hand rail - 1 Locomotion: Stairs: 2: Up and Down 4 - 11 stairs with minimal assistance (Pt.>75%)   Refer  to Care Plan for Long Term Goals  Recommendations for other services: None  Discharge Criteria: Patient will be discharged from PT if patient refuses treatment 3 consecutive times without medical reason, if treatment goals not met, if there is a change in medical status, if patient makes no progress towards goals or if patient is discharged from hospital.  The above assessment, treatment plan, treatment alternatives and goals were discussed and mutually agreed upon: by patient  Gilmore Laroche 01/18/2014, 2:34 PM

## 2014-01-18 NOTE — Progress Notes (Signed)
Subjective/Complaints: Had a good night. Slept well. Denies pain. A 12 point review of systems has been performed and if not noted above is otherwise negative.   Objective: Vital Signs: Blood pressure 132/78, pulse 99, temperature 97.9 F (36.6 C), temperature source Oral, resp. rate 18, weight 83.008 kg (183 lb), SpO2 98.00%. No results found.  Recent Labs  01/17/14 1902 01/18/14 0545  WBC 5.7 5.3  HGB 10.1* 8.8*  HCT 29.8* 25.6*  PLT 235 192    Recent Labs  01/17/14 0649 01/17/14 1902 01/18/14 0545  NA 138  --  142  K 4.4  --  4.5  CL 102  --  104  GLUCOSE 116*  --  108*  BUN 29*  --  29*  CREATININE 2.62* 2.57* 2.46*  CALCIUM 8.7  --  8.4   CBG (last 3)   Recent Labs  01/16/14 2023  GLUCAP 105*    Wt Readings from Last 3 Encounters:  01/17/14 83.008 kg (183 lb)  01/16/14 83.779 kg (184 lb 11.2 oz)  10/31/11 96 kg (211 lb 10.3 oz)    Physical Exam:  HENT:  Head: Normocephalic.  Eyes: EOM are normal.  No nystagmus  Neck: Normal range of motion. Neck supple. No thyromegaly present.  Cardiovascular: Normal rate and regular rhythm. No murmurs, rubs  Respiratory: Effort normal and breath sounds normal. No respiratory distress. No wheezes  GI: Soft. Bowel sounds are normal. He exhibits no distension.  Neurological: He is alert.  Patient was pleasant during exam a good eye contact. Improve attention and awareness. Oriented to hospital, date. Follows all simple commands. Strength 4/5 UE's. LE's 3/5 HF, 4/5 knee and ankles. No gross sensory deficits, DTR's 1+, no tremors, resting tone. CN exam non-focal.  Skin: Skin is warm and dry  Psych: pleasant and cooperative   Assessment/Plan: 1. Functional deficits secondary to metabolic toxic/encephalopathy which require 3+ hours per day of interdisciplinary therapy in a comprehensive inpatient rehab setting. Physiatrist is providing close team supervision and 24 hour management of active medical problems  listed below. Physiatrist and rehab team continue to assess barriers to discharge/monitor patient progress toward functional and medical goals. FIM:             FIM - Bed/Chair Transfer Bed/Chair Transfer: 6: Sit > Supine: No assist;6: Supine > Sit: No assist;4: Bed > Chair or W/C: Min A (steadying Pt. > 75%);4: Chair or W/C > Bed: Min A (steadying Pt. > 75%)  FIM - Locomotion: Wheelchair Locomotion: Wheelchair: 0: Activity did not occur (Pt at ambulatory level) FIM - Locomotion: Ambulation Locomotion: Ambulation Assistive Devices: Other (comment) (gait belt) Ambulation/Gait Assistance: 5: Supervision;4: Min assist;4: Min guard Locomotion: Ambulation: 4: Travels 150 ft or more with minimal assistance (Pt.>75%)  Comprehension Comprehension Mode: Auditory Comprehension: 4-Understands basic 75 - 89% of the time/requires cueing 10 - 24% of the time  Expression Expression Mode: Verbal Expression: 3-Expresses basic 50 - 74% of the time/requires cueing 25 - 50% of the time. Needs to repeat parts of sentences.  Social Interaction Social Interaction: 4-Interacts appropriately 75 - 89% of the time - Needs redirection for appropriate language or to initiate interaction.  Problem Solving Problem Solving: 2-Solves basic 25 - 49% of the time - needs direction more than half the time to initiate, plan or complete simple activities  Memory Memory: 4-Recognizes or recalls 75 - 89% of the time/requires cueing 10 - 24% of the time  Medical Problem List and  Plan:  1. Metabolic toxic encephalopathy  2. DVT Prophylaxis/Anticoagulation: Subcutaneous heparin. Monitor platelet counts and any signs of bleeding  3. Pain Management: Tylenol as needed  4. Mood/schizoaffective disorder. Cogentin 2 mg each bedtime,Clozaril 100 mg twice a day and 400 mg each bedtime, Effexor 75 mg twice a day. ---appropriate mood 5. Neuropsych: This patient is capable of making decisions on his own behalf.  6. Seizure  disorder. Keppra 500 mg twice a day. EEG negative  7. Acute renal insufficiency. Last hemodialysis 01/04/2014. Creatinine improved to 2.46 today 8. Hyperlipidemia. Lipitor  9. Dysphagia. Dysphagia 2 nectar liquids. Monitor for signs of aspiration. Followup speech therapy  10. Anemia: hgb 8.8--folic acid, diet  LOS (Days) 1 A FACE TO FACE EVALUATION WAS PERFORMED  SWARTZ,ZACHARY T 01/18/2014 10:41 AM

## 2014-01-18 NOTE — Progress Notes (Signed)
Note reviewed and accurately reflects treatment session.   

## 2014-01-19 ENCOUNTER — Inpatient Hospital Stay (HOSPITAL_COMMUNITY): Payer: Medicare Other

## 2014-01-19 ENCOUNTER — Encounter (HOSPITAL_COMMUNITY): Payer: Medicare Other

## 2014-01-19 ENCOUNTER — Inpatient Hospital Stay (HOSPITAL_COMMUNITY): Payer: Medicare Other | Admitting: Speech Pathology

## 2014-01-19 NOTE — Progress Notes (Signed)
Speech Language Pathology Daily Session Note  Patient Details  Name: Brady Hoffman MRN: [id number] Date of Birth: 12-27-1965  Today's Date: 01/19/2014 Time: 1130-1200 Time Calculation (min): 30 min  Short Term Goals: Week 1: SLP Short Term Goal 1 (Week 1): Pt will recall current swallowing compensatory strategies of small bites and a slow pace with Mod I.  SLP Short Term Goal 2 (Week 1): Pt will consume current diet with minimal overt s/s of aspiration with Mod I for utilization of swallowing compensatory strategies.   Skilled Therapeutic Interventions: Skilled treatment session focused on dysphagia goals. SLP facilitated session by providing verbal cue X 1 for utilization of small bites throughout the meal. Pt consumed lunch meal of regular textures with thin liquids via straw without overt s/s of aspiration. Pt's family present and educated on pt's current swallowing function, diet recommendations and swallowing compensatory strategies.  They verbalized understanding of all information.  Continue with current plan of care.    FIM:  Comprehension Comprehension Mode: Auditory Comprehension: 5-Follows basic conversation/direction: With extra time/assistive device Expression Expression Mode: Verbal Expression: 4-Expresses basic 75 - 89% of the time/requires cueing 10 - 24% of the time. Needs helper to occlude trach/needs to repeat words. Social Interaction Social Interaction: 5-Interacts appropriately 90% of the time - Needs monitoring or encouragement for participation or interaction. Problem Solving Problem Solving: 5-Solves basic 90% of the time/requires cueing < 10% of the time Memory Memory: 4-Recognizes or recalls 75 - 89% of the time/requires cueing 10 - 24% of the time FIM - Eating Eating Activity: 6: Swallowing techniques: self-managed  Pain Pain Assessment No/Denies Pain  Therapy/Group: Individual Therapy  Brady Hoffman 01/19/2014, 12:07 PM

## 2014-01-19 NOTE — Progress Notes (Signed)
Physical Therapy Session Note  Patient Details  Name: Brady Hoffman MRN: [id number] Date of Birth: [birth year]  Today's Date: 01/19/2014 Time: Treatment Session 1: 0930-1010; Treatment Session 2: 1583-0940 Time Calculation (min): Treatment Session 1: 40 min; Treatment Session 2: 87min  Short Term Goals: Week 1:  PT Short Term Goal 1 (Week 1): STGs=LTGs due to anticipated LOS  Skilled Therapeutic Interventions/Progress Updates:  Treatment Session 1:  1:1. Pt received semi-reclined in bed ready for therapy. Focus this session on functional endurance and pathfinding/memory. Pt able to amb >1000' throughout busy community environments in hospital including negotiation up flight of steps w/ single rail using step to pattern, req close (S)-min guard assist overall. Noted mild R inattention to obstacles, but decreased drifting during amb this session and no LOB. In gift shop, pt challenged with remembering and finding 4 items. Pt able to remember 2/4 items, req mod cues for last 2 but able to easily find items. Pt also demonstrating good memory of pathfinding back to room from [employer]	[place] as challenged yesterday, minimal use of signs. Pt stated feeling slightly dizzy after prolonged bout of amb, BP 110/78 and HR 121. NuStep performed at end of session to target B UE/LE strength/encurance, good tolerance to level 4x37min. Pt sitting in recliner at end of session w/ all needs in reach.   Treatment Session 2:  1:1. Pt received semi-reclined in bed, ready for therapy. Focus this session on higher level balance challenges, floor transfer and management of laundry. Pt able to amb >600'x2 this session while performing various balance challenges including speed changes, head turns, tossing to self/dribbling/passing ball (from various directions with therapist), braiding and walking backwards. Pt req supervision for majority of balance, with min A for correction of LOB to L side 3x- mostly when people passing  closely to him in hallways. Practiced floor t/f, pt able to complete w/ (S) and cueing for seq/safety. In middle of therapy session, pt requesting to sit due to feeling of panic attack coming on. Pt noted little relief in sitting and wished to lie down in bed as this is what he typical does at home with onset. Following 59min break, pt agreeable to participate in remainder of therapy. Focus on management of laundry, req overall (S) to obtain clothes from dryer, fold and place in drawers. Pt supine in bed at end of session w/ all needs in reach, bed alarm on. RN aware of pt's mild panic attack.   Therapy Documentation Precautions:  Precautions Precautions: Fall Precaution Comments: Seizure Precautions Restrictions Weight Bearing Restrictions: No Therapy Vitals Pulse Rate: 121 BP: 110/78 mmHg Patient Position, if appropriate: Standing Oxygen Therapy O2 Device: None (Room air) Pain: Pain Assessment Pain Assessment: 0-10 Pain Score: 5  Pain Location: Back Pain Orientation: Left;Lower Pain Descriptors / Indicators: Aching Pain Intervention(s): RN made aware;Distraction  See FIM for current functional status  Therapy/Group: Individual Therapy  Gilmore Laroche 01/19/2014, 10:18 AM

## 2014-01-19 NOTE — Progress Notes (Signed)
Occupational Therapy Session Note  Patient Details  Name: Brady Hoffman MRN: [id number] Date of Birth: November 30, 1966  Today's Date: 01/19/2014 Time: 0800-0900 Time Calculation (min): 60 min  Short Term Goals: Week 1:  OT Short Term Goal 1 (Week 1): STG=LTG  Skilled Therapeutic Interventions/Progress Updates:    Pt was standing at sink with NT present upon arrival.  Pt stood at sink to complete oral care routine.  Pt ambulated to walk in shower without AD and close supervision and stood to doff UB/LB clothing.  Pt stood in shower to complete bathing routine with supervision A.  Pt donned underwear while standing in shower and then ambulated without AD and supervision A to sit on bed to complete UB/LB dressing.  Pt cleaned up towels from bathroom and bedroom and then collected personal clothing to be washed.  Pt ambulated without AD to laundry room and started laundry with min sequencing cues.  Pt returned to room with c/o 5/10 back pain and RN was notified.  Focus on: Increasing independence in ADL performance, increasing dynamic standing balance and endurance.   Therapy Documentation Precautions:  Precautions Precautions: Fall Precaution Comments: Seizure Precautions Restrictions Weight Bearing Restrictions: No  See FIM for current functional status  Therapy/Group: Individual Therapy  Asriel Westrup 01/19/2014, 11:09 AM

## 2014-01-19 NOTE — Discharge Instructions (Signed)
Inpatient Rehab Discharge Instructions  Brady Hoffman Discharge date and time: No discharge date for patient encounter.   Activities/Precautions/ Functional Status: Activity: activity as tolerated Diet: regular diet Wound Care: none needed Functional status:  ___ No restrictions     ___ Walk up steps independently __x_ 24/7 supervision/assistance   ___ Walk up steps with assistance ___ Intermittent supervision/assistance  ___ Bathe/dress independently ___ Walk with walker     ___ Bathe/dress with assistance ___ Walk Independently    ___ Shower independently _x__ Walk with assistance    ___ Shower with assistance ___ No alcohol     ___ Return to work/school ________  Special Instructions:    My questions have been answered and I understand these instructions. I will adhere to these goals and the provided educational materials after my discharge from the hospital.  Patient/Caregiver Signature _______________________________ Date __________  Clinician Signature _______________________________________ Date __________  Please bring this form and your medication list with you to all your follow-up doctor's appointments.

## 2014-01-19 NOTE — Progress Notes (Signed)
Note reviewed and accurately reflects treatment session.   

## 2014-01-19 NOTE — Progress Notes (Signed)
Subjective/Complaints: Slept well. Had a good day with therapy yesterday. A 12 point review of systems has been performed and if not noted above is otherwise negative.   Objective: Vital Signs: Blood pressure 106/68, pulse 98, temperature 98 F (36.7 C), temperature source Oral, resp. rate 20, weight 83.008 kg (183 lb), SpO2 98.00%. No results found.  Recent Labs  01/17/14 1902 01/18/14 0545  WBC 5.7 5.3  HGB 10.1* 8.8*  HCT 29.8* 25.6*  PLT 235 192    Recent Labs  01/17/14 0649 01/17/14 1902 01/18/14 0545  NA 138  --  142  K 4.4  --  4.5  CL 102  --  104  GLUCOSE 116*  --  108*  BUN 29*  --  29*  CREATININE 2.62* 2.57* 2.46*  CALCIUM 8.7  --  8.4   CBG (last 3)   Recent Labs  01/16/14 2023  GLUCAP 105*    Wt Readings from Last 3 Encounters:  01/17/14 83.008 kg (183 lb)  01/16/14 83.779 kg (184 lb 11.2 oz)  10/31/11 96 kg (211 lb 10.3 oz)    Physical Exam:  HENT:  Head: Normocephalic.  Eyes: EOM are normal.  No nystagmus  Neck: Normal range of motion. Neck supple. No thyromegaly present.  Cardiovascular: Normal rate and regular rhythm. No murmurs, rubs  Respiratory: Effort normal and breath sounds normal. No respiratory distress. No wheezes  GI: Soft. Bowel sounds are normal. He exhibits no distension.  Neurological: He is alert.  good attention and awareness. Oriented to hospital, date. Follows all simple commands. Strength 4/5 UE's. LE's 3+/5 HF, 4/5 knee and ankles. No gross sensory deficits, DTR's 1+, no tremors, resting tone. CN exam non-focal.  Skin: Skin is warm and dry  Psych: pleasant and cooperative   Assessment/Plan: 1. Functional deficits secondary to metabolic toxic/encephalopathy which require 3+ hours per day of interdisciplinary therapy in a comprehensive inpatient rehab setting. Physiatrist is providing close team supervision and 24 hour management of active medical problems listed below. Physiatrist and rehab team  continue to assess barriers to discharge/monitor patient progress toward functional and medical goals. FIM: FIM - Bathing Bathing Steps Patient Completed: Chest;Right Arm;Left Arm;Abdomen;Front perineal area;Buttocks Bathing: 5: Set-up assist to: Obtain items  FIM - Upper Body Dressing/Undressing Upper body dressing/undressing steps patient completed: Thread/unthread right sleeve of pullover shirt/dresss;Thread/unthread left sleeve of pullover shirt/dress;Put head through opening of pull over shirt/dress;Pull shirt over trunk Upper body dressing/undressing: 5: Set-up assist to: Obtain clothing/put away FIM - Lower Body Dressing/Undressing Lower body dressing/undressing steps patient completed: Thread/unthread right underwear leg;Thread/unthread left underwear leg;Pull underwear up/down;Thread/unthread right pants leg;Thread/unthread left pants leg;Pull pants up/down Lower body dressing/undressing: 5: Set-up assist to: Obtain clothing  FIM - Toileting Toileting steps completed by patient: Adjust clothing prior to toileting;Performs perineal hygiene;Adjust clothing after toileting Toileting: 4: Steadying assist  FIM - Air cabin crew Transfers: 4-To toilet/BSC: Min A (steadying Pt. > 75%);4-From toilet/BSC: Min A (steadying Pt. > 75%)  FIM - Bed/Chair Transfer Bed/Chair Transfer: 5: Supine > Sit: Supervision (verbal cues/safety issues);5: Sit > Supine: Supervision (verbal cues/safety issues);4: Bed > Chair or W/C: Min A (steadying Pt. > 75%);4: Chair or W/C > Bed: Min A (steadying Pt. > 75%)  FIM - Locomotion: Wheelchair Locomotion: Wheelchair: 0: Activity did not occur (Pt at ambulatory level) FIM - Locomotion: Ambulation Locomotion: Ambulation Assistive Devices: Other (comment) (gait belt) Ambulation/Gait Assistance: 5: Supervision;4: Min assist;4: Min guard Locomotion: Ambulation: 4: Travels 150 ft  or more with minimal assistance (Pt.>75%)  Comprehension Comprehension Mode:  Auditory Comprehension: 5-Follows basic conversation/direction: With extra time/assistive device  Expression Expression Mode: Verbal Expression: 4-Expresses basic 75 - 89% of the time/requires cueing 10 - 24% of the time. Needs helper to occlude trach/needs to repeat words.  Social Interaction Social Interaction: 5-Interacts appropriately 90% of the time - Needs monitoring or encouragement for participation or interaction.  Problem Solving Problem Solving: 4-Solves basic 75 - 89% of the time/requires cueing 10 - 24% of the time  Memory Memory: 4-Recognizes or recalls 75 - 89% of the time/requires cueing 10 - 24% of the time  Medical Problem List and Plan:  1. Metabolic toxic encephalopathy--cognition nearing baseline 2. DVT Prophylaxis/Anticoagulation: Subcutaneous heparin. Monitor platelet counts and any signs of bleeding  3. Pain Management: Tylenol as needed  4. Mood/schizoaffective disorder. Cogentin 2 mg each bedtime,Clozaril 100 mg twice a day and 400 mg each bedtime, Effexor 75 mg twice a day. ---appropriate mood thus far 5. Neuropsych: This patient is capable of making decisions on his own behalf.  6. Seizure disorder. Keppra 500 mg twice a day. EEG negative  7. Acute renal insufficiency. Last hemodialysis 01/04/2014. Creatinine improved to 2.46 today 8. Hyperlipidemia. Lipitor  9. Dysphagia. Dysphagia 2 nectar liquids. Monitor for signs of aspiration. Followup speech therapy  10. Anemia: hgb 8.8--folic acid, diet  LOS (Days) 2 A FACE TO FACE EVALUATION WAS PERFORMED  Brady Hoffman 01/19/2014 8:31 AM

## 2014-01-20 ENCOUNTER — Inpatient Hospital Stay (HOSPITAL_COMMUNITY): Payer: Medicare Other | Admitting: Speech Pathology

## 2014-01-20 ENCOUNTER — Inpatient Hospital Stay (HOSPITAL_COMMUNITY): Payer: Medicare Other

## 2014-01-20 ENCOUNTER — Encounter (HOSPITAL_COMMUNITY): Payer: Medicare Other

## 2014-01-20 NOTE — Progress Notes (Signed)
Social Work  Social Work Assessment and Plan  Patient Details  Name: Brady Hoffman MRN: [id number] Date of Birth: 07/11/1966  Today's Date: 01/20/2014  Problem List:  Patient Active Problem List   Diagnosis Date Noted  . Encephalopathy 01/17/2014  . Anemia 01/14/2014  . Status epilepticus 01/02/2014  . Altered mental status 01/02/2014  . Metabolic acidosis 47/82/9562  . Acute respiratory failure 01/02/2014  . Schizoaffective disorder 10/31/2011  . Acute renal failure 10/31/2011  . Dyslipidemia 10/31/2011   Past Medical History:  Past Medical History  Diagnosis Date  . Hyperlipidemia   . Schizoaffective disorder   . H/O: GI bleed   . Seizures   . Depression   . Chronic kidney disease   . Anemia 01/14/2014   Past Surgical History:  Past Surgical History  Procedure Laterality Date  . Mole removal    . Wisdom tooth extraction    . Esophagogastroduodenoscopy  11/01/2011    Procedure: ESOPHAGOGASTRODUODENOSCOPY (EGD);  Surgeon: Beryle Beams;  Location: [facility];  Service: Endoscopy;  Laterality: N/A;   Social History:  reports that he has never smoked. He has never used smokeless tobacco. He reports that he does not drink alcohol or use illicit drugs.  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  He does smile and respond positively to mother and aunt in the room.  Denies any emotional distress currently, however, does report periods of anxiety at times which cause him to make repetitive motions (usually with his legs).   Recent Psychosocial Issues: None of recent Pyschiatric History: as noted, pt with first psychiatric "break" (per mother) at age 47.  Has long hx of multiple psych hospitalizations with longest being at [facility] for over a year.  None is the past several years. Substance Abuse History: none  Patient / Family Perceptions, Expectations & Goals Pt/Family understanding of illness & functional limitations: pt with limited understanding that he may have had a seizure and that is reason for hospitalization.  Mother and aunt feel very strongly that there was no seizure, but instead, "a problem with his medicines and he just got so weak that he fell down." Premorbid pt/family roles/activities: Pt has lived in local group home for the past 12 years and has managed well there according to facility and to mother. Anticipated changes in roles/activities/participation: Little change anticipated as his goals are set for supervision Pt/family expectations/goals: supervision  [employer]: None Premorbid Home Care/DME Agencies: None Transportation available at discharge: yes - group home provides  Discharge Planning Living Arrangements: Other (Comment) (group home) Support Systems:  Parent;Other  relatives Type of Residence: Group home Insurance Resources: Medicare;Medicaid (specify county) Financial Resources: Firefighter Screen Referred: No Living Expenses: Other (Comment) (group home funded by [employer]) Money Management: Family Does the patient have any problems obtaining your medications?: No Home Management: group home support Patient/Family Preliminary Plans: pt plans to return to group home - [place] Work Anticipated Follow Up Needs: HH/OP Expected length of stay: 7 days  Clinical Impression Pleasant, oriented gentleman here after fall and suspected seizure.  Significant h/o schizophrenia since age 54 and has lived in group home [facility]) x 12 yrs without difficulty.  Mother and aunt are supportive and plan is for pt to return to group home.  Goals set for supervision.  No issues anticipated which should affect his return.  [name] 01/20/2014, 1:21 PM

## 2014-01-20 NOTE — Discharge Summary (Signed)
Discharge summary job [id number]

## 2014-01-20 NOTE — Progress Notes (Signed)
Speech Language Pathology Discharge Summary  Patient Details  Name: Brady Hoffman MRN: [id number] Date of Birth: Nov 13, 1966  Today's Date: 01/20/2014 Time: 9528-4132 Time Calculation (min): 30 min  Skilled Therapeutic Interventions:  Treatment focus on dysphagia goals. Pt consumed lunch meal of regular textures with thin liquids with minimal overt s/s of aspiration and utilized small bites and a slow rate of self-feeding with Mod I. Recommend to continue with current diet.    Patient has met 1 of 1 long term goals.  Patient to discharge at overall Modified Independent level.   Reasons goals not met: N/A   Clinical Impression/Discharge Summary: Pt has made excellent gains and has met 1 of 1 LTG's this reporting period due to increased swallowing safety with regular textures and thin liquids. Pt is currently Mod I for utilization of small bites and a slow rate of self-feeding with meals and demonstrates minimal overt s/s of aspiration. Pt is currently at his cognitive baseline and will discharge back to the group home he was previously residing with supervision from staff.  Skilled SLP intervention is not needed at this time.    Recommendation:  None      Equipment: N/A   Reasons for discharge: Treatment goals met;Discharged from hospital   Patient/Family Agrees with Progress Made and Goals Achieved: Yes   See FIM for current functional status  Brady Hoffman 01/20/2014, 2:26 PM

## 2014-01-20 NOTE — Progress Notes (Signed)
Physical Therapy Discharge Summary  Patient Details  Name: Brady Hoffman MRN: [id number] Date of Birth: 10-29-1966  Today's Date: 01/20/2014 Time: Treatment Session 1: 3888-2800; Treatment Session 2: 3491-7915 Time Calculation (min):Treatment Session 1: 55 min; Treatment Session 2: 91mn  Patient has met 9 of 9 long term goals due to improved activity tolerance, improved balance, improved awareness and improved coordination.  Patient to discharge at an independent ambulatory level. Pt able to direct care for assistance as needed and will continue to have supervision provided by mother when ambulating in community environments.   Reasons goals not met: N/A  Recommendation:  Pt does not require ongoing skilled PT services at this time due to level of independent mobility achieved during his LOS. Pt appears to be at baseline mobility (per report by mother and pt) and will have supervision when out in community environments.   Equipment: No equipment provided  Reasons for discharge: treatment goals met and discharge from hospital  Patient/family agrees with progress made and goals achieved: Yes  Skilled 1:1. Pt received supine in bed, ready for therapy. Pt able to demonstrate bed mobility, transfers (furniture, floor and car), ambulation in controlled/home environments at independent level as well as ambulation, stair negotiation in community environments w/ overall (S). Pt continues to present w/ mild drifting to both L and R as well as flexed head positioning during amb, but verbalizes that this is baseline for him. Berg Balance test reassessed and pt scored 52/56 demonstrating significant improvement regarding dynamic balance. Pt educated on results and verbalized understanding. Pt's higher level balance challenged this session with forwards/backwards amb and side stepping while dribbling/passing ball with therapist in moderately busy hallway environment w/ overall distant (S). Pt taking  seated rest intermittently throughout session 2/2 fatigue. Pt supine in bed at end of session w/ all needs in reach, bed alarm on.   Treatment Session 2: 1:1. Pt received semi-reclined in bed w/ mother and aunt present. Focus this session on community ambulation and family training while escorting mother and aunt from pt's room>hospital entrance and amb back to room with therapist. Pt pushing aunt in w/c while amb >1000', req overall (S). Pt steering w/c very close to R side of hall and coming close to bumping into objects on R side, but frequently correcting at last minute. Mild R inattention brought to mother's attention with mother providing appropriate cues for remainder of amb. Pt and mother educated on ways to address R inattention in community setting such as pushing cart when shopping. Pt mod(I) for assembling drink at nurses station and bringing it back to room. Pt semi-reclined in bed at end of session w/ all needs in reach, bed alarm on.   PT Discharge Precautions/Restrictions Precautions Precautions: Fall Precaution Comments: Seizure Precautions Restrictions Weight Bearing Restrictions: No Vital Signs Therapy Vitals Temp: 98 F (36.7 C) Temp src: Oral Pulse Rate: 89 Resp: 18 BP: 106/61 mmHg Patient Position, if appropriate: Lying Oxygen Therapy SpO2: 100 % O2 Device: None (Room air) Pain Pain Assessment Pain Assessment: 0-10 Pain Score: 5  Pain Location: Back Pain Orientation: Mid;Lower Pain Descriptors / Indicators: Aching Pain Intervention(s): Distraction (Pt reports receiving pain meds) Vision/Perception  Vision - History Baseline Vision: Wears glasses for distance only Perception Perception: mild R inattention- improved from time of eval Praxis Praxis: Intact  Cognition Overall Cognitive Status: History of cognitive impairments - at baseline Arousal/Alertness: Awake/alert Orientation Level: Oriented to person;Oriented to place;Oriented to time Attention:  Selective;Alternating Selective Attention: Appears intact Alternating Attention: Appears  intact Memory: Appears intact Awareness: Appears intact Problem Solving: Appears intact Self Correcting: Appears intact Safety/Judgment: Appears intact Sensation Sensation Light Touch: Appears Intact Stereognosis: Appears Intact Hot/Cold: Appears Intact Proprioception: Appears Intact Coordination Gross Motor Movements are Fluid and Coordinated: Yes Fine Motor Movements are Fluid and Coordinated: Yes Motor  Motor Motor: Within Functional Limits  Mobility Bed Mobility Bed Mobility: Supine to Sit;Sit to Supine Supine to Sit: 7: Independent Sit to Supine: 7: Independent Transfers Transfers: Yes Sit to Stand: 7: Independent Stand to Sit: 7: Independent Stand Pivot Transfers: 7: Independent Locomotion  Ambulation Ambulation: Yes Ambulation/Gait Assistance: 6: Modified independent (Device/Increase time) Ambulation Distance (Feet): 150 Feet Assistive device: None Ambulation/Gait Assistance Details: Pt consistently amb w/ head down- likely baseine Gait Gait: Yes Gait Pattern: Within Functional Limits Gait velocity: decreased-limited ability to change speeds. Mild balance impairments when amb in community, but able to self-correct majority of time- recommendation for Supervison during amb in community environment Stairs / Additional Locomotion Stairs: Yes Stairs Assistance: 5. Supervision (in community environment) Stair Management Technique: One rail Left;One rail Right;Alternating pattern Number of Stairs: 12 Wheelchair Mobility Wheelchair Mobility: No (Pt at ambulatory level)  Trunk/Postural Assessment  Cervical Assessment Cervical Assessment: Within Functional Limits Thoracic Assessment Thoracic Assessment: Within Functional Limits Lumbar Assessment Lumbar Assessment: Within Functional Limits Postural Control Postural Control: Within Functional Limits  Balance Balance Balance  Assessed: Yes Standardized Balance Assessment Standardized Balance Assessment: Berg Balance Test Berg Balance Test Sit to Stand: Able to stand without using hands and stabilize independently Standing Unsupported: Able to stand safely 2 minutes Sitting with Back Unsupported but Feet Supported on Floor or Stool: Able to sit safely and securely 2 minutes Stand to Sit: Sits safely with minimal use of hands Transfers: Able to transfer safely, minor use of hands Standing Unsupported with Eyes Closed: Able to stand 10 seconds with supervision Standing Ubsupported with Feet Together: Able to place feet together independently and stand 1 minute safely From Standing, Reach Forward with Outstretched Arm: Can reach confidently >25 cm (10") From Standing Position, Pick up Object from Floor: Able to pick up shoe safely and easily From Standing Position, Turn to Look Behind Over each Shoulder: Looks behind from both sides and weight shifts well Turn 360 Degrees: Able to turn 360 degrees safely but slowly Standing Unsupported, Alternately Place Feet on Step/Stool: Able to stand independently and safely and complete 8 steps in 20 seconds Standing Unsupported, One Foot in Front: Able to plae foot ahead of the other independently and hold 30 seconds Standing on One Leg: Able to lift leg independently and hold > 10 seconds Total Score: 52 Static Sitting Balance Static Sitting - Balance Support: Bilateral upper extremity supported;Feet supported Static Sitting - Level of Assistance: 7: Independent Dynamic Sitting Balance Dynamic Sitting - Balance Support: Bilateral upper extremity supported;Feet supported Dynamic Sitting - Level of Assistance: 7: Independent Dynamic Sitting - Balance Activities: Lateral lean/weight shifting;Forward lean/weight shifting;Reaching for objects Static Standing Balance Static Standing - Balance Support: No upper extremity supported Static Standing - Level of Assistance: 7:  Independent Dynamic Standing Balance Dynamic Standing - Balance Support: Left upper extremity supported;Right upper extremity supported;No upper extremity supported Dynamic Standing - Level of Assistance: 7: Independent Dynamic Standing - Balance Activities: Lateral lean/weight shifting;Forward lean/weight shifting;Reaching for objects;Reaching across midline Extremity Assessment  RUE Assessment RUE Assessment: Within Functional Limits LUE Assessment LUE Assessment: Within Functional Limits RLE Assessment RLE Assessment: Within Functional Limits (Grossly 4+/5) LLE Assessment LLE Assessment: Within Functional Limits (Grossly  4+/5)  See FIM for current functional status  Gilmore Laroche 01/20/2014, 11:02 AM

## 2014-01-20 NOTE — Progress Notes (Signed)
Social Work Patient ID: Brady Hoffman, male   DOB: [birth year], 48 y.o.   MRN: [id number]  Therapies and MD agreed that pt's d/c date can be moved up to 2/20 as he is reaching his mod i goals.  Have discussed with [facility] group home manager who is also agreeable.  Pt and mother aware and agreeable.  Per mother's request, have also provided her with a list of private psychiatrists in [place] who are currently accepting new Medicare patients.  She hopes to switch pt's psych care away from [place] to private MD.    Lennart Pall, LCSW

## 2014-01-20 NOTE — IPOC Note (Signed)
Overall Plan of Care [facility]) Patient Details Name: Brady Hoffman MRN: [id number] DOB: November 26, 1966  Admitting Diagnosis: Encephalopathy  Hospital Problems: Active Problems:   Encephalopathy     Functional Problem List: Nursing Medication Management;Safety  PT Endurance;Balance;Safety;Other (comment) (proprioception)  OT Balance;Endurance  SLP    TR         Basic ADL's: OT Bathing;Grooming;Dressing;Toileting     Advanced  ADL's: OT       Transfers: PT Bed Mobility;Bed to Chair;Car;Furniture  OT Tub/Shower;Toilet     Locomotion: PT Stairs;Ambulation     Additional Impairments: OT None  SLP        TR      Anticipated Outcomes Item Anticipated Outcome  Self Feeding mod i  Swallowing  Mod I    Basic self-care  mod i  Toileting  modi   Bathroom Transfers mod i  Bowel/Bladder  mod I  Transfers  Mod(I)  Locomotion  Mod(I)  Communication     Cognition     Pain  min assist   Safety/Judgment  min assist    Therapy Plan: PT Intensity: Minimum of 1-2 x/day ,45 to 90 minutes PT Frequency: 5 out of 7 days PT Duration Estimated Length of Stay: 5-7days OT Intensity: Minimum of 1-2 x/day, 45 to 90 minutes OT Frequency: 5 out of 7 days OT Duration/Estimated Length of Stay: 6 SLP Intensity: Minumum of 1-2 x/day, 30 to 90 minutes SLP Frequency: 5 out of 7 days SLP Duration/Estimated Length of Stay: 3-5 days       Team Interventions: Nursing Interventions Patient/Family Education;Medication Management;Psychosocial Support;Discharge Planning;Dysphagia/Aspiration Precaution Training  PT interventions Balance/vestibular training;Ambulation/gait training;Cognitive remediation/compensation;Functional mobility training;Patient/family education;Therapeutic Exercise;UE/LE Strength taining/ROM;Stair training;Neuromuscular re-education;Visual/perceptual remediation/compensation;UE/LE Coordination activities;Therapeutic Activities;DME/adaptive equipment  instruction;Community reintegration;Discharge planning;Disease management/prevention  OT Interventions Balance/vestibular training;Community reintegration;Discharge planning;Patient/family education;Self Care/advanced ADL retraining;Psychosocial support;Therapeutic Activities;Therapeutic Exercise  SLP Interventions Cueing hierarchy;Dysphagia/aspiration precaution training;Environmental controls;Therapeutic Activities;Patient/family education;Functional tasks  TR Interventions    SW/CM Interventions Discharge Planning;Psychosocial Support;Patient/Family Education    Team Discharge Planning: Destination: PT-Home ,OT-  (group home) , SLP- (group home) Projected Follow-up: PT-None, OT-  None, SLP-None Projected Equipment Needs: PT-None recommended by PT, OT- None recommended by OT, SLP-None recommended by SLP Equipment Details: PT- , OT-  Patient/family involved in discharge planning: PT- Patient,  OT-Patient, SLP-Patient  MD ELOS: 7 days Medical Rehab Prognosis:  Excellent Assessment: The patient has been admitted for CIR therapies. The team will be addressing, functional mobility, strength, stamina, balance, safety, adaptive techniques/equipment, self-care, bowel and bladder mgt, patient and caregiver education, NMR, cognition, awareness, pain control. Goals have been set at mod I for mobility and self- care. Pt is approaching baseline cognitively. Should be able to return to his group home. Meredith Staggers, MD, FAAPMR      See Team Conference Notes for weekly updates to the plan of care

## 2014-01-20 NOTE — Patient Care Conference (Signed)
Inpatient RehabilitationTeam Conference and Plan of Care Update Date: 01/18/2014   Time: 3:10 PM    Patient Name: Brady Hoffman      Medical Record Number: [id number]  Date of Birth: [birth year] Sex: Male         Room/Bed: 4W16C/4W16C-01 Payor Info: Payor: MEDICARE / Plan: MEDICARE PART A AND B / Product Type: *No Product type* /    Admitting Diagnosis: Encephalopathy  Admit Date/Time:  01/17/2014  5:00 PM Admission Comments: No comment available   Primary Diagnosis:  <principal problem not specified> Principal Problem: <principal problem not specified>  Patient Active Problem List   Diagnosis Date Noted  . Encephalopathy 01/17/2014  . Anemia 01/14/2014  . Status epilepticus 01/02/2014  . Altered mental status 01/02/2014  . Metabolic acidosis 20/25/4270  . Acute respiratory failure 01/02/2014  . Schizoaffective disorder 10/31/2011  . Acute renal failure 10/31/2011  . Dyslipidemia 10/31/2011    Expected Discharge Date: Expected Discharge Date: 01/22/14  Team Members Present: Physician leading conference: Dr. Alger Simons Social Worker Present: Lennart Pall, LCSW Nurse Present: Heather Roberts, RN PT Present: Melene Plan, Cottie Banda, PT OT Present: Forde Radon, OT;Roanna Epley, COTA SLP Present: Weston Anna, SLP PPS Coordinator present : Daiva Nakayama, RN, CRRN;Becky Alwyn Ren, PT     Current Status/Progress Goal Weekly Team Focus  Medical   toxic encephalopathy, deconditioning  improve exercise tolerance, safety  balance, safety,   Bowel/Bladder   continent bowel and bladder   mod I  continue with plan of care    Swallow/Nutrition/ Hydration   regular textures with thin liquids, supervision-Min A  Mod I  increased utilization of small bites and slow pace, tolerance of current diet    ADL's   supervision ADLS  MOD I adls  balance with ADLs   Mobility   Supervision-min A for standing mobility >200'  Independent in controlled and home environments; Supervision  for community ambualtion  functional endurance, dynamic balance, emergent awarness   Communication   at baseline         Safety/Cognition/ Behavioral Observations  at baseline   min assist   educate on safety    Pain   n/a         Skin   n/a          Rehab Goals Patient on target to meet rehab goals: Yes *See Care Plan and progress notes for long and short-term goals.  Barriers to Discharge: none Id'    Possible Resolutions to Barriers:  n/a    Discharge Planning/Teaching Needs:  return to group home where 24/7 supervision is available      Team Discussion:  New eval.  Likely toxic encephalopathy.  ST to follow for swallow issues only as mother reports cognitively at baseline.  Currently supervision to minimal assist overall with mod I goals.  Plan return to group home  Revisions to Treatment Plan:  None   Continued Need for Acute Rehabilitation Level of Care: The patient requires daily medical management by a physician with specialized training in physical medicine and rehabilitation for the following conditions: Daily direction of a multidisciplinary physical rehabilitation program to ensure safe treatment while eliciting the highest outcome that is of practical value to the patient.: Yes Daily medical management of patient stability for increased activity during participation in an intensive rehabilitation regime.: Yes Daily analysis of laboratory values and/or radiology reports with any subsequent need for medication adjustment of medical intervention for : Neurological problems;Other  Brady Hoffman 01/18/2014, 3:10 PM

## 2014-01-20 NOTE — Progress Notes (Signed)
Occupational Therapy Session Note  Patient Details  Name: Zaden Sako MRN: [id number] Date of Birth: [birth year]  Today's Date: 01/20/2014 Time:0800  - 0900 Time Calculation: 52 Minutes    Short Term Goals: Week 1:  OT Short Term Goal 1 (Week 1): STG=LTG  Skilled Therapeutic Interventions/Progress Updates:    PT was in bed upon arrival.  Pt stood at sink to complete shaving routine and oral care routine at mod I level.  Pt ambulated without AD to shower and stood in walk in shower to complete bathing routine.  Pt was able to complete all UB/LB dressing with min sequencing cues.  Pt ambulated without AD to clean up bathing items, put clothes away and then remove sheets from bed.  Pt was able to sequence steps to complete bed making task.  Pt did take several rest breaks due to c/o pain in back at a level 4/10, RN notified.  Focus on: Increasing independence in ADL performance, functional ambulation, increasing endurance.     Therapy Documentation Precautions:  Precautions Precautions: Fall Precaution Comments: Seizure Precautions Restrictions Weight Bearing Restrictions: No  See FIM for current functional status  Therapy/Group: Individual Therapy  Darick Fetters 01/20/2014, 9:01 AM

## 2014-01-20 NOTE — Discharge Summary (Signed)
Brady Hoffman, Brady Hoffman NO.:  [id number]  MEDICAL RECORD NO.:  [id number]  LOCATION:  4W16C                        FACILITY:  [facility]  PHYSICIAN:  Meredith Staggers, M.D.DATE OF BIRTH:  [birth year]  DATE OF ADMISSION:  01/17/2014 DATE OF DISCHARGE:  01/21/2014                              DISCHARGE SUMMARY   DISCHARGE DIAGNOSES: 1. Metabolic toxic encephalopathy. 2. Subcutaneous heparin for deep venous thrombosis prophylaxis. 3. Schizoaffective disorder. 4. Seizure disorder. 5. Acute renal insufficiency, resolving. 6. Hyperlipidemia. 7. Dysphagia. 8. Anemia.  HISTORY OF PRESENT ILLNESS:  This is a 48 year old right-handed male with history of schizoaffective disorder, resident of a group home x12 years, who was admitted on January 02, 2014, after a fall, struck his head from ground level.  The patient without complaints, then developed seizure like activity and mental status changes.  The patient was intubated in the emergency department.  Cranial CT scan, cervical scan negative.  MRI of the brain negative.  CT abdomen and pelvis unremarkable.  Urine drug screen negative.  Chemistries revealed a creatinine of 20.63 from baseline 1.09, BUN 130, potassium 5.7.  Renal Services consulted, placed on gentle IV fluids.  Renal ultrasound negative for hydronephrosis.  Neurology Services consulted for noted seizure with EEG showing pattern consistent with normal sleep.  No evidence of encephalopathic process seen.  Maintained on Keppra therapy. Subcutaneous heparin for DVT prophylaxis.  A dysphagia II nectar thick liquid diet.  The patient was extubated on January 12, 2014.  He did require a short course of hemodialysis, last treatment on January 04, 2014, and creatinine improved to 2.92.  Physical and occupational therapy ongoing.  The patient was admitted for comprehensive rehab program.  PAST MEDICAL HISTORY:  See discharge diagnoses.  SOCIAL HISTORY:  Lives in a  group home.  FUNCTIONAL HISTORY:  Prior to admission independent.  Functional status upon admission to [facility] was moderate assist for bed mobility, ambulating short distances, needing assistance for lower body dressing.  PHYSICAL EXAMINATION:  VITAL SIGNS:  Blood pressure 126/87, pulse 101, temperature 98, respirations 18. GENERAL:  This is an alert male, pleasant, made good eye contact with examiner.  Fair attention and awareness.  He was oriented to hospital as well as date. HEENT:  Pupils round and reactive to light. LUNGS:  Clear to auscultation. CARDIAC:  Regular rate and rhythm. ABDOMEN:  Soft, nontender.  Good bowel sounds.  REHABILITATION HOSPITAL COURSE:  The patient was admitted to Inpatient Rehab Services with therapies initiated on a 3-hour daily basis consisting of physical therapy, occupational therapy, speech therapy, and rehabilitation nursing.  The following issues were addressed during the patient's rehabilitation stay.  Pertaining to Mr. [name] metabolic toxic encephalopathy, remained stable.  He continued to participate with therapies.  Maintained on subcutaneous heparin for DVT prophylaxis.  He did have a history of schizoaffective disorder as he remained on his Cogentin as well as Clozaril and Effexor.  Keppra ongoing for seizure disorder.  EEG negative.  Acute renal insufficiency, last hemodialysis January 04, 2014, followed at a distance by Renal Services.  Renal function continued to improve with latest creatinine 2.46 and stable.  His diet had been advanced to a regular consistency which he was tolerating  well.  The patient received weekly collaborative interdisciplinary team conferences to discuss estimated length of stay, family teaching, and any barriers to his discharge.  He was ambulating greater than 600 feet x2 while performing various balance challenges including speed changes, head turns, tossing to self dribbling, passing a ball.   Speech therapy follow up.  His diet had been advanced.  The patient was able to express his needs without any difficulty and interacted well with staff, solve his basic 90% of the time problems. Full family teaching was completed and plan was discharge to home.  DISCHARGE MEDICATIONS: 1. Aspirin 81 mg p.o. daily. 2. Lipitor 20 mg p.o. daily. 3. Cogentin 2 mg at bedtime. 4. Clozaril 100 mg b.i.d. and 400 mg at bedtime. 5. Folic acid 1 mg p.o. daily. 6. Keppra 500 mg p.o. b.i.d. 7. Protonix 40 mg p.o. at bedtime. 8. MiraLAX 17 g p.o. daily. 9. Effexor 75 mg p.o. b.i.d.  DIET:  Regular.  SPECIAL INSTRUCTIONS:  A 24-hour supervision for safety.  The patient to follow up Dr. Alger Simons, the Outpatient Rehab Service Office as directed, Dr. Jonnie Finner, Renal Services, call for appointment.     Lauraine Rinne, P.A.   ______________________________ Meredith Staggers, M.D.    DA/MEDQ  D:  01/20/2014  T:  01/20/2014  Job:  [id number]  cc:   Sol Blazing, M.D.

## 2014-01-20 NOTE — Progress Notes (Signed)
Subjective/Complaints: Had a panic attack when up with therapy yesterday but otherwise did ok. Feels fine now---not sure what really triggered A 12 point review of systems has been performed and if not noted above is otherwise negative.   Objective: Vital Signs: Blood pressure 106/61, pulse 89, temperature 98 F (36.7 C), temperature source Oral, resp. rate 18, weight 80.332 kg (177 lb 1.6 oz), SpO2 100.00%. No results found.  Recent Labs  01/17/14 1902 01/18/14 0545  WBC 5.7 5.3  HGB 10.1* 8.8*  HCT 29.8* 25.6*  PLT 235 192    Recent Labs  01/17/14 1902 01/18/14 0545  NA  --  142  K  --  4.5  CL  --  104  GLUCOSE  --  108*  BUN  --  29*  CREATININE 2.57* 2.46*  CALCIUM  --  8.4   CBG (last 3)  No results found for this basename: GLUCAP,  in the last 72 hours  Wt Readings from Last 3 Encounters:  01/19/14 80.332 kg (177 lb 1.6 oz)  01/16/14 83.779 kg (184 lb 11.2 oz)  10/31/11 96 kg (211 lb 10.3 oz)    Physical Exam:  HENT:  Head: Normocephalic.  Eyes: EOM are normal.  No nystagmus  Neck: Normal range of motion. Neck supple. No thyromegaly present.  Cardiovascular: Normal rate and regular rhythm. No murmurs, rubs  Respiratory: Effort normal and breath sounds normal. No respiratory distress. No wheezes  GI: Soft. Bowel sounds are normal. He exhibits no distension.  Neurological: He is alert.  good attention and awareness. Oriented to hospital, date. Follows all simple commands. Strength 4/5 UE's. LE's 3+/5 HF, 4/5 knee and ankles. No gross sensory deficits, DTR's 1+, no tremors, resting tone. CN exam non-focal.  Skin: Skin is warm and dry  Psych: pleasant and cooperative   Assessment/Plan: 1. Functional deficits secondary to metabolic toxic/encephalopathy which require 3+ hours per day of interdisciplinary therapy in a comprehensive inpatient rehab setting. Physiatrist is providing close team supervision and 24 hour management of active medical  problems listed below. Physiatrist and rehab team continue to assess barriers to discharge/monitor patient progress toward functional and medical goals. FIM: FIM - Bathing Bathing Steps Patient Completed: Chest;Right Arm;Left Arm;Abdomen;Front perineal area;Buttocks;Right upper leg;Left upper leg;Right lower leg (including foot);Left lower leg (including foot) Bathing: 5: Supervision: Safety issues/verbal cues  FIM - Upper Body Dressing/Undressing Upper body dressing/undressing steps patient completed: Pull shirt over trunk;Put head through opening of pull over shirt/dress;Thread/unthread right sleeve of pullover shirt/dresss;Thread/unthread left sleeve of pullover shirt/dress Upper body dressing/undressing: 5: Set-up assist to: Obtain clothing/put away FIM - Lower Body Dressing/Undressing Lower body dressing/undressing steps patient completed: Thread/unthread right underwear leg;Thread/unthread left underwear leg;Pull underwear up/down;Thread/unthread right pants leg;Thread/unthread left pants leg;Pull pants up/down;Don/Doff right sock;Don/Doff left sock Lower body dressing/undressing: 5: Supervision: Safety issues/verbal cues  FIM - Toileting Toileting steps completed by patient: Adjust clothing prior to toileting;Performs perineal hygiene;Adjust clothing after toileting Toileting: 4: Steadying assist  FIM - Air cabin crew Transfers: 4-To toilet/BSC: Min A (steadying Pt. > 75%);4-From toilet/BSC: Min A (steadying Pt. > 75%)  FIM - Bed/Chair Transfer Bed/Chair Transfer Assistive Devices: Arm rests Bed/Chair Transfer: 5: Supine > Sit: Supervision (verbal cues/safety issues);5: Sit > Supine: Supervision (verbal cues/safety issues);5: Chair or W/C > Bed: Supervision (verbal cues/safety issues);5: Bed > Chair or W/C: Supervision (verbal cues/safety issues)  FIM - Locomotion: Wheelchair Locomotion: Wheelchair: 0: Activity did not occur FIM - Locomotion: Ambulation Locomotion:  Ambulation Assistive Devices: Other (comment) (gait belt) Ambulation/Gait Assistance: 6: Modified independent (Device/Increase time) Locomotion: Ambulation: 4: Travels 150 ft or more with minimal assistance (Pt.>75%)  Comprehension Comprehension Mode: Auditory Comprehension: 5-Follows basic conversation/direction: With extra time/assistive device  Expression Expression Mode: Verbal Expression: 3-Expresses basic 50 - 74% of the time/requires cueing 25 - 50% of the time. Needs to repeat parts of sentences.  Social Interaction Social Interaction: 4-Interacts appropriately 75 - 89% of the time - Needs redirection for appropriate language or to initiate interaction.  Problem Solving Problem Solving: 3-Solves basic 50 - 74% of the time/requires cueing 25 - 49% of the time  Memory Memory: 4-Recognizes or recalls 75 - 89% of the time/requires cueing 10 - 24% of the time  Medical Problem List and Plan:  1. Metabolic toxic encephalopathy--cognition nearing baseline 2. DVT Prophylaxis/Anticoagulation: Subcutaneous heparin. Monitor platelet counts and any signs of bleeding  3. Pain Management: Tylenol as needed  4. Mood/schizoaffective disorder. Cogentin 2 mg each bedtime,Clozaril 100 mg twice a day and 400 mg each bedtime, Effexor 75 mg twice a day. ---appropriate thus far 5. Neuropsych: This patient is capable of making decisions on his own behalf.  6. Seizure disorder. Keppra 500 mg twice a day. EEG negative  7. Acute renal insufficiency. Last hemodialysis 01/04/2014. Creatinine improved to 2.46 today 8. Hyperlipidemia. Lipitor  9. Dysphagia. Dysphagia 2 nectar liquids. Monitor for signs of aspiration. Followup speech therapy  10. Anemia: hgb 8.8--folic acid, diet  LOS (Days) 3 A FACE TO FACE EVALUATION WAS PERFORMED  SWARTZ,ZACHARY T 01/20/2014 8:32 AM

## 2014-01-21 DIAGNOSIS — G929 Unspecified toxic encephalopathy: Secondary | ICD-10-CM

## 2014-01-21 DIAGNOSIS — R296 Repeated falls: Secondary | ICD-10-CM

## 2014-01-21 DIAGNOSIS — G92 Toxic encephalopathy: Secondary | ICD-10-CM

## 2014-01-21 DIAGNOSIS — N289 Disorder of kidney and ureter, unspecified: Secondary | ICD-10-CM

## 2014-01-21 DIAGNOSIS — R1310 Dysphagia, unspecified: Secondary | ICD-10-CM

## 2014-01-21 DIAGNOSIS — R561 Post traumatic seizures: Secondary | ICD-10-CM

## 2014-01-21 MED ORDER — OMEPRAZOLE 40 MG PO CPDR: 40.0000 mg | DELAYED_RELEASE_CAPSULE | Freq: Every day | ORAL | Status: DC

## 2014-01-21 MED ORDER — VENLAFAXINE HCL 75 MG PO TABS: 75.0000 mg | ORAL_TABLET | Freq: Two times a day (BID) | ORAL | Status: DC

## 2014-01-21 MED ORDER — ATORVASTATIN CALCIUM 20 MG PO TABS: 20.0000 mg | ORAL_TABLET | Freq: Every day | ORAL | Status: DC

## 2014-01-21 MED ORDER — LEVETIRACETAM ER 500 MG PO TB24: 500.0000 mg | ORAL_TABLET | Freq: Two times a day (BID) | ORAL | Status: DC

## 2014-01-21 MED ORDER — FOLIC ACID 1 MG PO TABS: 1.0000 mg | ORAL_TABLET | Freq: Every day | ORAL | Status: DC

## 2014-01-21 MED ORDER — BENZTROPINE MESYLATE 2 MG PO TABS: 2.0000 mg | ORAL_TABLET | Freq: Every day | ORAL | Status: DC

## 2014-01-21 MED ORDER — ASPIRIN 81 MG PO CHEW: 81.0000 mg | CHEWABLE_TABLET | Freq: Every day | ORAL | Status: DC

## 2014-01-21 MED ORDER — CLOZAPINE 200 MG PO TABS: 400.0000 mg | ORAL_TABLET | Freq: Every day | ORAL | Status: DC

## 2014-01-21 MED ORDER — CLOZAPINE 100 MG PO TABS: 100.0000 mg | ORAL_TABLET | Freq: Two times a day (BID) | ORAL | Status: DC

## 2014-01-21 NOTE — Progress Notes (Signed)
Occupational Therapy Discharge Summary  Patient Details  Name: Brady Hoffman MRN: [id number] Date of Birth: 1966/10/26  Today's Date: 01/21/2014   Patient has met 7 of 7 long term goals due to improved activity tolerance and improved balance.  Pt was able to make steady gains to meet all goals. Pt was able to complete all BADL tasks at a mod I level and followed directions very well. Pt to discharge back to group home. Pt's mother was not present for family education.  Modified Independent level.  Patient's care partner is independent to provide the necessary physical assistance at discharge.      Recommendation:  No equipment needs or further OT required  Equipment: No equipment provided  Reasons for discharge: treatment goals met and discharge from hospital  Patient/family agrees with progress made and goals achieved: Yes  OT Discharge Vision/Perception  Vision - Assessment Eye Alignment: Within Functional Limits Vision Assessment: Vision not tested  Cognition Overall Cognitive Status: History of cognitive impairments - at baseline Orientation Level: Oriented X4 Attention: Sustained Sustained Attention: Appears intact Awareness: Appears intact Problem Solving: Appears intact Safety/Judgment: Appears intact Sensation Sensation Light Touch: Appears Intact Stereognosis: Appears Intact Hot/Cold: Appears Intact Proprioception: Appears Intact Coordination Gross Motor Movements are Fluid and Coordinated: Yes Fine Motor Movements are Fluid and Coordinated: Yes    Trunk/Postural Assessment  Cervical Assessment Cervical Assessment: Within Functional Limits Thoracic Assessment Thoracic Assessment: Within Functional Limits Lumbar Assessment Lumbar Assessment: Within Functional Limits Postural Control Postural Control: Within Functional Limits  Balance Static Sitting Balance Static Sitting - Balance Support: Bilateral upper extremity supported;Feet supported Static  Sitting - Level of Assistance: 7: Independent Dynamic Sitting Balance Dynamic Sitting - Balance Support: Bilateral upper extremity supported;Feet supported Dynamic Sitting - Level of Assistance: 7: Independent Extremity/Trunk Assessment RUE Assessment RUE Assessment: Within Functional Limits LUE Assessment LUE Assessment: Within Functional Limits  See FIM for current functional status  Shipley,Sheila 01/21/2014, 11:42 AM I agree with the following treatment note after reviewing documentation.   Jeri Modena OTR/L Pager: [phone] Office: [phone] .

## 2014-01-21 NOTE — Progress Notes (Signed)
Subjective/Complaints: Rested well. Denies pain.  A 12 point review of systems has been performed and if not noted above is otherwise negative.   Objective: Vital Signs: Blood pressure 148/92, pulse 104, temperature 98.6 F (37 C), temperature source Oral, resp. rate 20, weight 80.332 kg (177 lb 1.6 oz), SpO2 100.00%. No results found. No results found for this basename: WBC, HGB, HCT, PLT,  in the last 72 hours No results found for this basename: NA, K, CL, CO, GLUCOSE, BUN, CREATININE, CALCIUM,  in the last 72 hours CBG (last 3)  No results found for this basename: GLUCAP,  in the last 72 hours  Wt Readings from Last 3 Encounters:  01/19/14 80.332 kg (177 lb 1.6 oz)  01/16/14 83.779 kg (184 lb 11.2 oz)  10/31/11 96 kg (211 lb 10.3 oz)    Physical Exam:  HENT:  Head: Normocephalic.  Eyes: EOM are normal.  No nystagmus  Neck: Normal range of motion. Neck supple. No thyromegaly present.  Cardiovascular: Normal rate and regular rhythm. No murmurs, rubs  Respiratory: Effort normal and breath sounds normal. No respiratory distress. No wheezes  GI: Soft. Bowel sounds are normal. He exhibits no distension.  Neurological: He is alert.  good attention and awareness. Oriented to hospital, date. Follows all simple commands. Strength 4/5 UE's. LE's 3+/5 HF, 4/5 knee and ankles. No gross sensory deficits, DTR's 1+, no tremors, resting tone. CN exam non-focal.  Skin: Skin is warm and dry  Psych: pleasant and cooperative   Assessment/Plan: 1. Functional deficits secondary to metabolic toxic/encephalopathy which require 3+ hours per day of interdisciplinary therapy in a comprehensive inpatient rehab setting. Physiatrist is providing close team supervision and 24 hour management of active medical problems listed below. Physiatrist and rehab team continue to assess barriers to discharge/monitor patient progress toward functional and medical goals.  Home today. Goals met! Follow  up with me in about a month.   FIM: FIM - Bathing Bathing Steps Patient Completed: Chest;Right Arm;Buttocks;Right upper leg;Left Arm;Left upper leg;Abdomen;Right lower leg (including foot);Front perineal area;Left lower leg (including foot) Bathing: 6: More than reasonable amount of time  FIM - Upper Body Dressing/Undressing Upper body dressing/undressing steps patient completed: Thread/unthread right sleeve of pullover shirt/dresss;Thread/unthread left sleeve of pullover shirt/dress;Put head through opening of pull over shirt/dress;Pull shirt over trunk Upper body dressing/undressing: 6: More than reasonable amount of time FIM - Lower Body Dressing/Undressing Lower body dressing/undressing steps patient completed: Thread/unthread right underwear leg;Thread/unthread left underwear leg;Pull underwear up/down;Thread/unthread right pants leg;Thread/unthread left pants leg;Pull pants up/down;Don/Doff right sock;Don/Doff left sock Lower body dressing/undressing: 6: More than reasonable amount of time  FIM - Toileting Toileting steps completed by patient: Adjust clothing prior to toileting;Performs perineal hygiene;Adjust clothing after toileting Toileting: 4: Steadying assist  FIM - Air cabin crew Transfers: 4-To toilet/BSC: Min A (steadying Pt. > 75%);4-From toilet/BSC: Min A (steadying Pt. > 75%)  FIM - Bed/Chair Transfer Bed/Chair Transfer Assistive Devices: Arm rests Bed/Chair Transfer: 7: Supine > Sit: No assist;7: Sit > Supine: No assist;7: Chair or W/C > Bed: No assist;7: Bed > Chair or W/C: No assist  FIM - Locomotion: Wheelchair Locomotion: Wheelchair: 0: Activity did not occur (Pt at ambulatory level) FIM - Locomotion: Ambulation Locomotion: Ambulation Assistive Devices: Other (comment) (none) Ambulation/Gait Assistance: 7: Independent Locomotion: Ambulation: 7: Travels 150 ft or more independently  Comprehension Comprehension Mode: Auditory Comprehension: 5-Follows  basic conversation/direction: With no assist  Expression Expression Mode: Verbal Expression: 4-Expresses basic 75 -  89% of the time/requires cueing 10 - 24% of the time. Needs helper to occlude trach/needs to repeat words.  Social Interaction Social Interaction: 4-Interacts appropriately 75 - 89% of the time - Needs redirection for appropriate language or to initiate interaction.  Problem Solving Problem Solving: 4-Solves basic 75 - 89% of the time/requires cueing 10 - 24% of the time  Memory Memory: 4-Recognizes or recalls 75 - 89% of the time/requires cueing 10 - 24% of the time  Medical Problem List and Plan:  1. Metabolic toxic encephalopathy--cognition nearing baseline 2. DVT Prophylaxis/Anticoagulation: Subcutaneous heparin. Monitor platelet counts and any signs of bleeding  3. Pain Management: Tylenol as needed  4. Mood/schizoaffective disorder. Cogentin 2 mg each bedtime,Clozaril 100 mg twice a day and 400 mg each bedtime, Effexor 75 mg twice a day. ---appropriate thus far 5. Neuropsych: This patient is capable of making decisions on his own behalf.  6. Seizure disorder. Keppra 500 mg twice a day. EEG negative  7. Acute renal insufficiency. Last hemodialysis 01/04/2014. Creatinine improved to 2.46 today 8. Hyperlipidemia. Lipitor  9. Dysphagia. Dysphagia 2 nectar liquids. Monitor for signs of aspiration. Followup speech therapy  10. Anemia: hgb 8.8--folic acid, diet  LOS (Days) 4 A FACE TO FACE EVALUATION WAS PERFORMED  SWARTZ,ZACHARY T 01/21/2014 8:32 AM

## 2014-01-21 NOTE — Progress Notes (Signed)
Social Work  Discharge Note  The overall goal for the admission was met for:   Discharge location: Yes - return to group home - [facility]  Length of Stay: Yes - 4 days  Discharge activity level: Yes - supervision to independent  Home/community participation: Yes  Services provided included: MD, RD, PT, OT, SLP, RN, Pharmacy and SW  Financial Services: Medicare and Medicaid  Follow-up services arranged: NA - no follow services or DME needs at time of d/c.  Mother did request list of local psychiatrists who are accepting new Medicare patients which I did provide to her.  Comments (or additional information):  Patient/Family verbalized understanding of follow-up arrangements: Yes  Individual responsible for coordination of the follow-up plan: mother  Confirmed correct DME delivered: NA    Brady Hoffman

## 2014-01-21 NOTE — Progress Notes (Signed)
Patient discharged to home with caregiver. Patient and caregiver received discharge instructions with verbal understanding. Belongings sent with patient and caregiver.

## 2014-01-25 NOTE — Progress Notes (Signed)
Note reviewed and accurately reflects treatment session.   

## 2014-02-10 ENCOUNTER — Ambulatory Visit (INDEPENDENT_AMBULATORY_CARE_PROVIDER_SITE_OTHER): Payer: Medicare Other | Admitting: Psychiatry

## 2014-02-10 ENCOUNTER — Encounter (HOSPITAL_COMMUNITY): Payer: Self-pay | Admitting: Psychiatry

## 2014-02-10 VITALS — BP 136/80 | HR 107 | Ht 75.0 in | Wt 186.4 lb

## 2014-02-10 DIAGNOSIS — F29 Unspecified psychosis not due to a substance or known physiological condition: Secondary | ICD-10-CM

## 2014-02-10 DIAGNOSIS — Z79899 Other long term (current) drug therapy: Secondary | ICD-10-CM

## 2014-02-10 DIAGNOSIS — F259 Schizoaffective disorder, unspecified: Secondary | ICD-10-CM

## 2014-02-10 NOTE — Progress Notes (Signed)
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  PROCALCITONIN     Status: None  Collection Time    01/04/14  3:42 AM      Result Value Ref Range   Procalcitonin 0.31     Comment:            Interpretation:     PCT (Procalcitonin) <= 0.5 ng/mL:      Systemic infection (sepsis) is not likely.     Local bacterial infection is possible.     (NOTE)             ICU PCT Algorithm               Non ICU PCT Algorithm        ----------------------------     ------------------------------             PCT < 0.25 ng/mL                 PCT < 0.1 ng/mL         Stopping of antibiotics            Stopping of antibiotics           strongly encouraged.               strongly encouraged.        ----------------------------     ------------------------------           PCT level decrease by               PCT < 0.25 ng/mL           >= 80% from peak PCT           OR PCT 0.25 - 0.5 ng/mL          Stopping of antibiotics                                                 encouraged.         Stopping of antibiotics               encouraged.        ----------------------------     ------------------------------           PCT level decrease by              PCT >= 0.25 ng/mL           < 80% from peak PCT            AND PCT >= 0.5 ng/mL            Continuing antibiotics                                                  encouraged.           Continuing antibiotics                encouraged.        ----------------------------     ------------------------------         PCT level increase compared          PCT > 0.5 ng/mL             with peak PCT AND  PCT >= 0.5 ng/mL             Escalation of antibiotics                                              strongly encouraged.          Escalation of antibiotics            strongly encouraged.  GLUCOSE, CAPILLARY     Status: None   Collection Time    01/04/14  4:12 AM   Value Ref Range    82  70 - 99 mg/dL    Documented in Chart      Notify RN             Value Ref Range    76  70 - 99 mg/dL           Value Ref Range    167  7 - 232 U/L           Value Ref Range    2.4  1.5 - 2.5 mg/dL           Value Ref Range    3.9  2.3 - 4.6 mg/dL     Collection Time    01/04/14 11:17 AM       Result Value Ref Range   Magnesium 2.4  1.5 - 2.5 mg/dL  PHOSPHORUS     Status: None   Collection Time    01/04/14 11:17 AM      Result Value Ref Range   Phosphorus 3.9  2.3 - 4.6 mg/dL  GLUCOSE, CAPILLARY     Status: None   Collection Time    01/04/14 11:33 AM      Result Value Ref Range   Glucose-Capillary 84  70 - 99 mg/dL  GLUCOSE, CAPILLARY     Status: None   Collection Time    01/04/14  3:34 PM      Result Value Ref Range   Glucose-Capillary 84  70 - 99 mg/dL  GLUCOSE, CAPILLARY     Status: None   Collection Time    01/04/14  6:50 PM      Result Value Ref Range   Glucose-Capillary 80  70 - 99 mg/dL  MAGNESIUM     Status: None   Collection Time    01/04/14 10:14 PM      Result Value Ref Range   Magnesium 2.4  1.5 - 2.5 mg/dL  PHOSPHORUS     Status: Abnormal   Collection Time    01/04/14 10:14 PM      Result Value Ref Range   Phosphorus 5.7 (*) 2.3 - 4.6 mg/dL  GLUCOSE, CAPILLARY     Status: None   Collection Time    01/04/14 11:51 PM      Result Value Ref Range   Glucose-Capillary 75  70 - 99 mg/dL  MAGNESIUM     Status: None   Collection Time    01/05/14  1:30 AM      Result Value Ref Range   Magnesium 2.4  1.5 - 2.5 mg/dL  PHOSPHORUS     Status: Abnormal   Collection Time    01/05/14  1:30 AM      Result Value Ref Range   Phosphorus 6.2 (*) 2.3 - 4.6 mg/dL  GLUCOSE, CAPILLARY     Status: None  Collection Time    01/05/14  3:54 AM      Result Value Ref Range   Glucose-Capillary 83  70 - 99 mg/dL  MAGNESIUM     Status: None   Collection Time    01/05/14  4:10 AM      Result Value Ref Range   Magnesium 2.4  1.5 - 2.5 mg/dL  APTT     Status: None   Collection Time    01/05/14  4:10 AM      Result Value Ref Range   aPTT 36  24 - 37 seconds  CBC     Status: Abnormal   Collection Time    01/05/14  4:10 AM      Result Value Ref Range   WBC 6.7  4.0 - 10.5 K/uL   RBC 3.14 (*) 4.22 - 5.81 MIL/uL   Hemoglobin 9.7 (*) 13.0 - 17.0 g/dL   HCT 27.4 (*)  39.0 - 52.0 %   MCV 87.3  78.0 - 100.0 fL   MCH 30.9  26.0 - 34.0 pg   MCHC 35.4  30.0 - 36.0 g/dL   RDW 13.2  11.5 - 15.5 %   Platelets 124 (*) 150 - 400 K/uL  BASIC METABOLIC PANEL     Status: Abnormal   Collection Time    01/05/14  4:10 AM      Result Value Ref Range   Sodium 144  137 - 147 mEq/L   Potassium 4.0  3.7 - 5.3 mEq/L   Chloride 106  96 - 112 mEq/L   CO2 19  19 - 32 mEq/L   Glucose, Bld 90  70 - 99 mg/dL   BUN 30 (*) 6 - 23 mg/dL   Creatinine, Ser 6.17 (*) 0.50 - 1.35 mg/dL   Calcium 7.5 (*) 8.4 - 10.5 mg/dL   GFR calc non Af Amer 10 (*) >90 mL/min   GFR calc Af Amer 11 (*) >90 mL/min                      PROCALCITONIN     Status: None            Result Value Ref Range   Procalcitonin 0.33      01/06/14  5:00 AM      Result Value Ref Range   Sodium 149 (*) 137 - 147 mEq/L   Potassium 4.2  3.7 - 5.3 mEq/L   Chloride 111  96 - 112 mEq/L   CO2 19  19 - 32 mEq/L   Glucose, Bld 119 (*) 70 - 99 mg/dL   BUN 37 (*) 6 - 23 mg/dL   Creatinine, Ser 7.23 (*) 0.50 - 1.35 mg/dL   Calcium 8.0 (*) 8.4 - 10.5 mg/dL   GFR calc non Af Amer 8 (*) >90 mL/min   GFR calc Af Amer 9 (*) >90 mL/min   Comment: (NOTE)     The eGFR has been calculated using the CKD EPI equation.     This calculation has not been validated in all clinical situations.     eGFR's persistently <90 mL/min signify possible Chronic Kidney     Disease.  CBC     Status: Abnormal   Collection Time    01/06/14  5:00 AM      Result Value Ref Range   WBC 6.9  4.0 - 10.5 K/uL   RBC 3.15 (*) 4.22 - 5.81 MIL/uL   Hemoglobin 9.7 (*) 13.0 - 17.0 g/dL  HCT 27.8 (*) 39.0 - 52.0 %   MCV 88.3  78.0 - 100.0 fL   MCH 30.8  26.0 - 34.0 pg   MCHC 34.9  30.0 - 36.0 g/dL   RDW 13.5  11.5 - 15.5 %   Platelets 131 (*) 150 - 400 K/uL  GLUCOSE, CAPILLARY     Status: Abnormal   Collection Time    01/06/14  8:07 AM      Result Value Ref Range   Glucose-Capillary 116 (*) 70 - 99 mg/dL  PROTEIN ELECTROPHORESIS, SERUM      Status: Abnormal   Collection Time    01/06/14  8:30 AM      Result Value Ref Range   Total Protein ELP 4.9 (*) 6.0 - 8.3 g/dL   Albumin ELP 52.9 (*) 55.8 - 66.1 %   Alpha-1-Globulin 9.7 (*) 2.9 - 4.9 %   Alpha-2-Globulin 15.6 (*) 7.1 - 11.8 %   Beta Globulin 6.8  4.7 - 7.2 %   Beta 2 6.1  3.2 - 6.5 %   Gamma Globulin 8.9 (*) 11.1 - 18.8 %   M-Spike, % NOT DETECTED     SPE Interp. (NOTE)     Comment: Nonspecific pattern associated with slightly decreased gamma     globulins.     Reviewed by Odis Hollingshead, MD, PhD, FCAP (Electronic Signature on     File)                                                  Collection Time    01/06/14  8:30 AM      Result Value Ref Range   Kappa free light chain 3.41 (*) 0.33 - 1.94 mg/dL   Lamda free light chains 2.13  0.57 - 2.63 mg/dL   Kappa, lamda light chain ratio 1.60  0.26 - 1.65   Comment: Performed at [facility], CAPILLARY     Status: Abnormal   Collection Time    01/06/14 11:58 AM      Result Value Ref Range   Glucose-Capillary 111 (*) 70 - 99 mg/dL  GLUCOSE, CAPILLARY     Status: None   Collection Time    01/06/14  3:46 PM      Result Value Ref Range   Glucose-Capillary 84  70 - 99 mg/dL  TRIGLYCERIDES     Status: Abnormal   Collection Time    01/06/14  6:00 PM      Result Value Ref Range   Triglycerides 176 (*) <150 mg/dL  GLUCOSE, CAPILLARY     Status: None   Collection Time    01/06/14  6:57 PM      Result Value Ref Range   Glucose-Capillary 98  70 - 99 mg/dL  GLUCOSE, CAPILLARY     Status: None   Collection Time    01/06/14 11:58 PM      Result Value Ref Range   Glucose-Capillary 97  70 - 99 mg/dL  BASIC METABOLIC PANEL     Status: Abnormal   Collection Time    01/07/14  4:10 AM      Result Value Ref Range   Sodium 150 (*) 137 - 147 mEq/L   Potassium 4.5  3.7 - 5.3 mEq/L   Chloride 113 (*) 96 - 112 mEq/L   CO2 19  19 -  32 mEq/L   Glucose, Bld 109 (*) 70 - 99 mg/dL   BUN 39 (*)  6 - 23 mg/dL   Creatinine, Ser 7.45 (*) 0.50 - 1.35 mg/dL   Calcium 7.9 (*) 8.4 - 10.5 mg/dL   GFR calc non Af Amer 8 (*) >90 mL/min   GFR calc Af Amer 9 (*) >90 mL/min   Comment: (NOTE)     The eGFR has been calculated using the CKD EPI equation.     This calculation has not been validated in all clinical situations.     eGFR's persistently <90 mL/min signify possible Chronic Kidney     Disease.  CBC     Status: Abnormal   Collection Time    01/07/14  4:10 AM      Result Value Ref Range   WBC 5.8  4.0 - 10.5 K/uL   RBC 2.83 (*) 4.22 - 5.81 MIL/uL   Hemoglobin 8.9 (*) 13.0 - 17.0 g/dL   HCT 25.5 (*) 39.0 - 52.0 %   MCV 90.1  78.0 - 100.0 fL   MCH 31.4  26.0 - 34.0 pg   MCHC 34.9  30.0 - 36.0 g/dL   RDW 13.9  11.5 - 15.5 %   Platelets 134 (*) 150 - 400 K/uL  GLUCOSE, CAPILLARY     Status: Abnormal   Collection Time    01/07/14  4:13 AM      Result Value Ref Range   Glucose-Capillary 105 (*) 70 - 99 mg/dL   Comment 1 Documented in Chart     Comment 2 Notify RN    GLUCOSE, CAPILLARY     Status: Abnormal   Collection Time    01/07/14  8:09 AM      Result Value Ref Range   Glucose-Capillary 105 (*) 70 - 99 mg/dL  GLUCOSE, CAPILLARY     Status: None   Collection Time    01/07/14 11:40 AM      Result Value Ref Range   Glucose-Capillary 79  70 - 99 mg/dL  GLUCOSE, CAPILLARY     Status: None   Collection Time    01/07/14  3:38 PM      Result Value Ref Range   Glucose-Capillary 98  70 - 99 mg/dL  BASIC METABOLIC PANEL     Status: Abnormal   Collection Time    01/07/14  5:00 PM      Result Value Ref Range   Sodium 150 (*) 137 - 147 mEq/L   Potassium 5.0  3.7 - 5.3 mEq/L   Chloride 110  96 - 112 mEq/L   CO2 20  19 - 32 mEq/L   Glucose, Bld 104 (*) 70 - 99 mg/dL   BUN 41 (*) 6 - 23 mg/dL   Creatinine, Ser 7.21 (*) 0.50 - 1.35 mg/dL   Calcium 8.3 (*) 8.4 - 10.5 mg/dL   GFR calc non Af Amer 8 (*) >90 mL/min   GFR calc Af Amer 9 (*) >90 mL/min   Comment: (NOTE)     The  eGFR has been calculated using the CKD EPI equation.     This calculation has not been validated in all clinical situations.     eGFR's persistently <90 mL/min signify possible Chronic Kidney     Disease.  GLUCOSE, CAPILLARY     Status: Abnormal   Collection Time    01/07/14  6:58 PM      Result Value Ref Range   Glucose-Capillary 105 (*) 70 - 99  mg/dL  GLUCOSE, CAPILLARY     Status: None   Collection Time    01/07/14 11:54 PM      Result Value Ref Range   Glucose-Capillary 98  70 - 99 mg/dL  GLUCOSE, CAPILLARY     Status: None   Collection Time    01/08/14  3:56 AM      Result Value Ref Range   Glucose-Capillary 89  70 - 99 mg/dL  CBC     Status: Abnormal   Collection Time    01/08/14  4:22 AM      Result Value Ref Range   WBC 10.0  4.0 - 10.5 K/uL   RBC 2.96 (*) 4.22 - 5.81 MIL/uL   Hemoglobin 8.9 (*) 13.0 - 17.0 g/dL   HCT 26.5 (*) 39.0 - 52.0 %   MCV 89.5  78.0 - 100.0 fL   MCH 30.1  26.0 - 34.0 pg   MCHC 33.6  30.0 - 36.0 g/dL   RDW 13.3  11.5 - 15.5 %   Platelets 141 (*) 150 - 400 K/uL  BASIC METABOLIC PANEL     Status: Abnormal   Collection Time    01/08/14  4:22 AM      Result Value Ref Range   Sodium 149 (*) 137 - 147 mEq/L   Potassium 5.0  3.7 - 5.3 mEq/L   Chloride 110  96 - 112 mEq/L   CO2 18 (*) 19 - 32 mEq/L   Glucose, Bld 88  70 - 99 mg/dL   BUN 44 (*) 6 - 23 mg/dL   Creatinine, Ser 7.15 (*) 0.50 - 1.35 mg/dL   Calcium 8.2 (*) 8.4 - 10.5 mg/dL   GFR calc non Af Amer 8 (*) >90 mL/min   GFR calc Af Amer 9 (*) >90 mL/min   Comment: (NOTE)     The eGFR has been calculated using the CKD EPI equation.     This calculation has not been validated in all clinical situations.     eGFR's persistently <90 mL/min signify possible Chronic Kidney     Disease.  GLUCOSE, CAPILLARY     Status: None   Collection Time    01/08/14  7:30 AM      Result Value Ref Range   Glucose-Capillary 93  70 - 99 mg/dL  GLUCOSE, CAPILLARY     Status: None   Collection Time     01/08/14 11:10 AM      Result Value Ref Range   Glucose-Capillary 94  70 - 99 mg/dL  GLUCOSE, CAPILLARY     Status: None   Collection Time    01/08/14  3:10 PM      Result Value Ref Range   Glucose-Capillary 85  70 - 99 mg/dL  BASIC METABOLIC PANEL     Status: Abnormal   Collection Time    01/08/14  6:00 PM      Result Value Ref Range   Sodium 148 (*) 137 - 147 mEq/L   Potassium 4.8  3.7 - 5.3 mEq/L   Chloride 108  96 - 112 mEq/L   CO2 15 (*) 19 - 32 mEq/L   Glucose, Bld 77  70 - 99 mg/dL   BUN 47 (*) 6 - 23 mg/dL   Creatinine, Ser 6.92 (*) 0.50 - 1.35 mg/dL   Calcium 8.2 (*) 8.4 - 10.5 mg/dL   GFR calc non Af Amer 8 (*) >90 mL/min   GFR calc Af Amer 10 (*) >90 mL/min  Comment: (NOTE)     The eGFR has been calculated using the CKD EPI equation.     This calculation has not been validated in all clinical situations.     eGFR's persistently <90 mL/min signify possible Chronic Kidney     Disease.  GLUCOSE, CAPILLARY     Status: None   Collection Time    01/08/14  7:24 PM      Result Value Ref Range   Glucose-Capillary 70  70 - 99 mg/dL  GLUCOSE, CAPILLARY     Status: None   Collection Time    01/08/14 11:57 PM      Result Value Ref Range   Glucose-Capillary 89  70 - 99 mg/dL  GLUCOSE, CAPILLARY     Status: None   Collection Time    01/09/14  3:51 AM      Result Value Ref Range   Glucose-Capillary 81  70 - 99 mg/dL  BASIC METABOLIC PANEL     Status: Abnormal   Collection Time    01/09/14  4:00 AM      Result Value Ref Range   Sodium 148 (*) 137 - 147 mEq/L   Potassium 4.8  3.7 - 5.3 mEq/L   Chloride 108  96 - 112 mEq/L   CO2 15 (*) 19 - 32 mEq/L   Glucose, Bld 88  70 - 99 mg/dL   BUN 51 (*) 6 - 23 mg/dL   Creatinine, Ser 6.70 (*) 0.50 - 1.35 mg/dL   Calcium 8.5  8.4 - 10.5 mg/dL   GFR calc non Af Amer 9 (*) >90 mL/min   GFR calc Af Amer 10 (*) >90 mL/min   Comment: (NOTE)     The eGFR has been calculated using the CKD EPI equation.     This calculation has not  been validated in all clinical situations.     eGFR's persistently <90 mL/min signify possible Chronic Kidney     Disease.  CBC     Status: Abnormal   Collection Time    01/09/14  4:00 AM      Result Value Ref Range   WBC 9.8  4.0 - 10.5 K/uL   RBC 3.11 (*) 4.22 - 5.81 MIL/uL   Hemoglobin 9.4 (*) 13.0 - 17.0 g/dL   HCT 27.9 (*) 39.0 - 52.0 %   MCV 89.7  78.0 - 100.0 fL   MCH 30.2  26.0 - 34.0 pg   MCHC 33.7  30.0 - 36.0 g/dL   RDW 13.4  11.5 - 15.5 %   Platelets 165  150 - 400 K/uL  GLUCOSE, CAPILLARY     Status: None   Collection Time    01/09/14  7:09 AM      Result Value Ref Range   Glucose-Capillary 86  70 - 99 mg/dL  GLUCOSE, CAPILLARY     Status: None   Collection Time    01/09/14 11:16 AM      Result Value Ref Range   Glucose-Capillary 82  70 - 99 mg/dL  POCT I-STAT 3, BLOOD GAS (G3+)     Status: Abnormal   Collection Time    01/09/14  2:34 PM      Result Value Ref Range   pH, Arterial 7.408  7.350 - 7.450   pCO2 arterial 25.7 (*) 35.0 - 45.0 mmHg   pO2, Arterial 71.0 (*) 80.0 - 100.0 mmHg   Bicarbonate 16.2 (*) 20.0 - 24.0 mEq/L   TCO2 17  0 - 100 mmol/L  O2 Saturation 94.0     Acid-base deficit 7.0 (*) 0.0 - 2.0 mmol/L   Patient temperature 99.1 F     Collection site RADIAL, ALLEN'S TEST ACCEPTABLE     Drawn by Operator     Sample type ARTERIAL    GLUCOSE, CAPILLARY     Status: None   Collection Time    01/09/14  3:04 PM      Result Value Ref Range   Glucose-Capillary 94  70 - 99 mg/dL  LACTIC ACID, PLASMA     Status: None   Collection Time    01/09/14  3:36 PM      Result Value Ref Range   Lactic Acid, Venous 0.6  0.5 - 2.2 mmol/L  CK     Status: Abnormal   Collection Time    01/09/14  4:00 PM      Result Value Ref Range   Total CK 660 (*) 7 - 232 U/L  KETONES, QUALITATIVE     Status: Abnormal   Collection Time    01/09/14  4:00 PM      Result Value Ref Range   Acetone, Bld MODERATE (*) NEGATIVE  GLUCOSE, CAPILLARY     Status: None   Collection  Time    01/09/14  7:30 PM      Result Value Ref Range   Glucose-Capillary 96  70 - 99 mg/dL  GLUCOSE, CAPILLARY     Status: Abnormal   Collection Time    01/09/14 11:50 PM      Result Value Ref Range   Glucose-Capillary 109 (*) 70 - 99 mg/dL  BASIC METABOLIC PANEL     Status: Abnormal   Collection Time    01/10/14  3:28 AM      Result Value Ref Range   Sodium 150 (*) 137 - 147 mEq/L   Potassium 4.2  3.7 - 5.3 mEq/L   Chloride 111  96 - 112 mEq/L   CO2 19  19 - 32 mEq/L   Glucose, Bld 123 (*) 70 - 99 mg/dL   BUN 49 (*) 6 - 23 mg/dL   Creatinine, Ser 6.00 (*) 0.50 - 1.35 mg/dL   Calcium 8.4  8.4 - 10.5 mg/dL   GFR calc non Af Amer 10 (*) >90 mL/min   GFR calc Af Amer 12 (*) >90 mL/min   Comment: (NOTE)     The eGFR has been calculated using the CKD EPI equation.     This calculation has not been validated in all clinical situations.     eGFR's persistently <90 mL/min signify possible Chronic Kidney     Disease.  TROPONIN I     Status: None   Collection Time    01/10/14  3:28 AM      Result Value Ref Range   Troponin I <0.30  <0.30 ng/mL   Comment:            Due to the release kinetics of cTnI,     a negative result within the first hours     of the onset of symptoms does not rule out     myocardial infarction with certainty.     If myocardial infarction is still suspected,     repeat the test at appropriate intervals.  MAGNESIUM     Status: None   Collection Time    01/10/14  3:29 AM      Result Value Ref Range   Magnesium 2.1  1.5 - 2.5 mg/dL  GLUCOSE, CAPILLARY  Status: Abnormal   Collection Time    01/10/14  3:47 AM      Result Value Ref Range   Glucose-Capillary 122 (*) 70 - 99 mg/dL   Comment 1 Notify RN    GLUCOSE, CAPILLARY     Status: Abnormal   Collection Time    01/10/14  8:21 AM      Result Value Ref Range   Glucose-Capillary 140 (*) 70 - 99 mg/dL  GLUCOSE, CAPILLARY     Status: Abnormal   Collection Time    01/10/14 12:18 PM      Result Value  Ref Range   Glucose-Capillary 116 (*) 70 - 99 mg/dL  POCT I-STAT 3, BLOOD GAS (G3+)     Status: Abnormal   Collection Time    01/10/14  1:17 PM      Result Value Ref Range   pH, Arterial 7.418  7.350 - 7.450   pCO2 arterial 44.2  35.0 - 45.0 mmHg   pO2, Arterial 368.0 (*) 80.0 - 100.0 mmHg   Bicarbonate 28.5 (*) 20.0 - 24.0 mEq/L   TCO2 30  0 - 100 mmol/L   O2 Saturation 100.0     Acid-Base Excess 4.0 (*) 0.0 - 2.0 mmol/L   Patient temperature 98.4 F     Collection site RADIAL, ALLEN'S TEST ACCEPTABLE     Drawn by Operator     Sample type ARTERIAL    TROPONIN I     Status: None   Collection Time    01/10/14  3:02 PM      Result Value Ref Range   Troponin I <0.30  <0.30 ng/mL   Comment:            Due to the release kinetics of cTnI,     a negative result within the first hours     of the onset of symptoms does not rule out     myocardial infarction with certainty.     If myocardial infarction is still suspected,     repeat the test at appropriate intervals.  GLUCOSE, CAPILLARY     Status: Abnormal   Collection Time    01/10/14  3:56 PM      Result Value Ref Range   Glucose-Capillary 100 (*) 70 - 99 mg/dL  GLUCOSE, CAPILLARY     Status: Abnormal   Collection Time    01/10/14  6:58 PM      Result Value Ref Range   Glucose-Capillary 118 (*) 70 - 99 mg/dL  GLUCOSE, CAPILLARY     Status: Abnormal   Collection Time    01/11/14 12:01 AM      Result Value Ref Range   Glucose-Capillary 113 (*) 70 - 99 mg/dL   Comment 1 Documented in Chart     Comment 2 Notify RN    GLUCOSE, CAPILLARY     Status: Abnormal   Collection Time    01/11/14  3:56 AM      Result Value Ref Range   Glucose-Capillary 113 (*) 70 - 99 mg/dL   Comment 1 Documented in Chart     Comment 2 Notify RN    POCT I-STAT 3, BLOOD GAS (G3+)     Status: Abnormal   Collection Time    01/11/14  4:08 AM      Result Value Ref Range   pH, Arterial 7.513 (*) 7.350 - 7.450   pCO2 arterial 33.6 (*) 35.0 - 45.0 mmHg    pO2, Arterial 130.0 (*) 80.0 -  100.0 mmHg   Bicarbonate 27.0 (*) 20.0 - 24.0 mEq/L   TCO2 28  0 - 100 mmol/L   O2 Saturation 99.0     Acid-Base Excess 4.0 (*) 0.0 - 2.0 mmol/L   Patient temperature 98.8 F     Collection site RADIAL, ALLEN'S TEST ACCEPTABLE     Drawn by RT     Sample type ARTERIAL    BASIC METABOLIC PANEL     Status: Abnormal   Collection Time    01/11/14  5:00 AM      Result Value Ref Range   Sodium 149 (*) 137 - 147 mEq/L   Potassium 3.9  3.7 - 5.3 mEq/L   Chloride 108  96 - 112 mEq/L   CO2 24  19 - 32 mEq/L   Glucose, Bld 119 (*) 70 - 99 mg/dL   BUN 42 (*) 6 - 23 mg/dL   Creatinine, Ser 5.00 (*) 0.50 - 1.35 mg/dL   Calcium 8.0 (*) 8.4 - 10.5 mg/dL   GFR calc non Af Amer 13 (*) >90 mL/min   GFR calc Af Amer 15 (*) >90 mL/min   Comment: (NOTE)     The eGFR has been calculated using the CKD EPI equation.     This calculation has not been validated in all clinical situations.     eGFR's persistently <90 mL/min signify possible Chronic Kidney     Disease.  GLUCOSE, CAPILLARY     Status: Abnormal   Collection Time    01/11/14  8:21 AM      Result Value Ref Range   Glucose-Capillary 122 (*) 70 - 99 mg/dL  GLUCOSE, CAPILLARY     Status: None   Collection Time    01/11/14 11:50 AM      Result Value Ref Range   Glucose-Capillary 98  70 - 99 mg/dL  GLUCOSE, CAPILLARY     Status: Abnormal   Collection Time    01/11/14  3:24 PM      Result Value Ref Range   Glucose-Capillary 108 (*) 70 - 99 mg/dL  GLUCOSE, CAPILLARY     Status: None   Collection Time    01/11/14  6:53 PM      Result Value Ref Range   Glucose-Capillary 94  70 - 99 mg/dL  GLUCOSE, CAPILLARY     Status: Abnormal   Collection Time    01/12/14 12:10 AM      Result Value Ref Range   Glucose-Capillary 105 (*) 70 - 99 mg/dL   Comment 1 Notify RN    BASIC METABOLIC PANEL     Status: Abnormal   Collection Time    01/12/14  4:06 AM      Result Value Ref Range   Sodium 144  137 - 147 mEq/L    Potassium 3.9  3.7 - 5.3 mEq/L   Chloride 105  96 - 112 mEq/L   CO2 21  19 - 32 mEq/L   Glucose, Bld 88  70 - 99 mg/dL   BUN 34 (*) 6 - 23 mg/dL   Creatinine, Ser 4.15 (*) 0.50 - 1.35 mg/dL   Calcium 7.7 (*) 8.4 - 10.5 mg/dL   GFR calc non Af Amer 16 (*) >90 mL/min   GFR calc Af Amer 18 (*) >90 mL/min   Comment: (NOTE)     The eGFR has been calculated using the CKD EPI equation.     This calculation has not been validated in all clinical situations.  eGFR's persistently <90 mL/min signify possible Chronic Kidney     Disease.  CBC WITH DIFFERENTIAL     Status: Abnormal   Collection Time    01/12/14  4:06 AM      Result Value Ref Range   WBC 5.6  4.0 - 10.5 K/uL   RBC 2.64 (*) 4.22 - 5.81 MIL/uL   Hemoglobin 8.1 (*) 13.0 - 17.0 g/dL   HCT 24.0 (*) 39.0 - 52.0 %   MCV 90.9  78.0 - 100.0 fL   MCH 30.7  26.0 - 34.0 pg   MCHC 33.8  30.0 - 36.0 g/dL   RDW 12.5  11.5 - 15.5 %   Platelets 125 (*) 150 - 400 K/uL   Neutrophils Relative % 65  43 - 77 %   Neutro Abs 3.6  1.7 - 7.7 K/uL   Lymphocytes Relative 18  12 - 46 %   Lymphs Abs 1.0  0.7 - 4.0 K/uL   Monocytes Relative 13 (*) 3 - 12 %   Monocytes Absolute 0.7  0.1 - 1.0 K/uL   Eosinophils Relative 5  0 - 5 %   Eosinophils Absolute 0.3  0.0 - 0.7 K/uL   Basophils Relative 0  0 - 1 %   Basophils Absolute 0.0  0.0 - 0.1 K/uL  MAGNESIUM     Status: None   Collection Time    01/12/14  4:06 AM      Result Value Ref Range   Magnesium 1.6  1.5 - 2.5 mg/dL  PHOSPHORUS     Status: Abnormal   Collection Time    01/12/14  4:06 AM      Result Value Ref Range   Phosphorus 4.8 (*) 2.3 - 4.6 mg/dL  ALBUMIN     Status: Abnormal   Collection Time    01/12/14  4:06 AM      Result Value Ref Range   Albumin 2.3 (*) 3.5 - 5.2 g/dL  GLUCOSE, CAPILLARY     Status: None   Collection Time    01/12/14  4:17 AM      Result Value Ref Range   Glucose-Capillary 84  70 - 99 mg/dL  POCT I-STAT 3, BLOOD GAS (G3+)     Status: Abnormal   Collection  Time    01/12/14  5:00 AM      Result Value Ref Range   pH, Arterial 7.365  7.350 - 7.450   pCO2 arterial 40.0  35.0 - 45.0 mmHg   pO2, Arterial 138.0 (*) 80.0 - 100.0 mmHg   Bicarbonate 22.8  20.0 - 24.0 mEq/L   TCO2 24  0 - 100 mmol/L   O2 Saturation 99.0     Acid-base deficit 2.0  0.0 - 2.0 mmol/L   Patient temperature 99.0 F     Collection site RADIAL, ALLEN'S TEST ACCEPTABLE     Drawn by RT     Sample type ARTERIAL    GLUCOSE, CAPILLARY     Status: None   Collection Time    01/12/14  8:15 AM      Result Value Ref Range   Glucose-Capillary 90  70 - 99 mg/dL  CBC     Status: Abnormal   Collection Time    01/12/14 11:00 AM      Result Value Ref Range   WBC 5.1  4.0 - 10.5 K/uL   RBC 2.65 (*) 4.22 - 5.81 MIL/uL   Hemoglobin 8.4 (*) 13.0 - 17.0 g/dL   HCT 23.9 (*)  39.0 - 52.0 %   MCV 90.2  78.0 - 100.0 fL   MCH 31.7  26.0 - 34.0 pg   MCHC 35.1  30.0 - 36.0 g/dL   RDW 12.2  11.5 - 15.5 %   Platelets 112 (*) 150 - 400 K/uL   Comment: PLATELET COUNT CONFIRMED BY SMEAR  GLUCOSE, CAPILLARY     Status: None   Collection Time    01/12/14 12:37 PM      Result Value Ref Range   Glucose-Capillary 85  70 - 99 mg/dL  GLUCOSE, CAPILLARY     Status: None   Collection Time    01/12/14  3:29 PM      Result Value Ref Range   Glucose-Capillary 82  70 - 99 mg/dL  GLUCOSE, CAPILLARY     Status: None   Collection Time    01/12/14  7:16 PM      Result Value Ref Range   Glucose-Capillary 72  70 - 99 mg/dL  GLUCOSE, CAPILLARY     Status: None   Collection Time    01/13/14 12:02 AM      Result Value Ref Range   Glucose-Capillary 80  70 - 99 mg/dL  BLOOD GAS, ARTERIAL     Status: Abnormal   Collection Time    01/13/14  3:44 AM      Result Value Ref Range   FIO2 0.28     Delivery systems NASAL CANNULA     pH, Arterial 7.320 (*) 7.350 - 7.450   pCO2 arterial 39.1  35.0 - 45.0 mmHg   pO2, Arterial 116.0 (*) 80.0 - 100.0 mmHg   Bicarbonate 19.6 (*) 20.0 - 24.0 mEq/L   TCO2 20.8  0 -  100 mmol/L   Acid-base deficit 5.5 (*) 0.0 - 2.0 mmol/L   O2 Saturation 98.3     Patient temperature 98.6     Collection site RIGHT RADIAL     Drawn by COLLECTED BY RT     Sample type ARTERIAL DRAW     Allens test (pass/fail) PASS  PASS  GLUCOSE, CAPILLARY     Status: None   Collection Time    01/13/14  4:09 AM      Result Value Ref Range   Glucose-Capillary 76  70 - 99 mg/dL  COMPREHENSIVE METABOLIC PANEL     Status: Abnormal   Collection Time    01/13/14  5:00 AM      Result Value Ref Range   Sodium 145  137 - 147 mEq/L   Potassium 3.9  3.7 - 5.3 mEq/L   Chloride 107  96 - 112 mEq/L   CO2 18 (*) 19 - 32 mEq/L   Glucose, Bld 75  70 - 99 mg/dL   BUN 30 (*) 6 - 23 mg/dL   Creatinine, Ser 3.33 (*) 0.50 - 1.35 mg/dL   Calcium 8.0 (*) 8.4 - 10.5 mg/dL   Total Protein 5.7 (*) 6.0 - 8.3 g/dL   Albumin 2.5 (*) 3.5 - 5.2 g/dL   AST 10  0 - 37 U/L   ALT 13  0 - 53 U/L   Alkaline Phosphatase 90  39 - 117 U/L   Total Bilirubin 0.3  0.3 - 1.2 mg/dL   GFR calc non Af Amer 21 (*) >90 mL/min   GFR calc Af Amer 24 (*) >90 mL/min   Comment: (NOTE)     The eGFR has been calculated using the CKD EPI equation.     This calculation  has not been validated in all clinical situations.     eGFR's persistently <90 mL/min signify possible Chronic Kidney     Disease.  CBC     Status: Abnormal   Collection Time    01/13/14  5:00 AM      Result Value Ref Range   WBC 4.6  4.0 - 10.5 K/uL   RBC 2.67 (*) 4.22 - 5.81 MIL/uL   Hemoglobin 8.3 (*) 13.0 - 17.0 g/dL   HCT 23.9 (*) 39.0 - 52.0 %   MCV 89.5  78.0 - 100.0 fL   MCH 31.1  26.0 - 34.0 pg   MCHC 34.7  30.0 - 36.0 g/dL   RDW 12.0  11.5 - 15.5 %   Platelets 129 (*) 150 - 400 K/uL  MAGNESIUM     Status: None   Collection Time    01/13/14  5:00 AM      Result Value Ref Range   Magnesium 1.8  1.5 - 2.5 mg/dL  PHOSPHORUS     Status: None   Collection Time    01/13/14  5:00 AM      Result Value Ref Range   Phosphorus 4.5  2.3 - 4.6 mg/dL   GLUCOSE, CAPILLARY     Status: None   Collection Time    01/13/14  8:14 AM      Result Value Ref Range   Glucose-Capillary 77  70 - 99 mg/dL  GLUCOSE, CAPILLARY     Status: None   Collection Time    01/13/14 11:46 AM      Result Value Ref Range   Glucose-Capillary 85  70 - 99 mg/dL  BASIC METABOLIC PANEL     Status: Abnormal   Collection Time    01/14/14  5:53 AM      Result Value Ref Range   Sodium 147  137 - 147 mEq/L   Potassium 3.7  3.7 - 5.3 mEq/L   Chloride 110  96 - 112 mEq/L   CO2 24  19 - 32 mEq/L   Glucose, Bld 99  70 - 99 mg/dL   BUN 30 (*) 6 - 23 mg/dL   Creatinine, Ser 2.92 (*) 0.50 - 1.35 mg/dL   Calcium 8.4  8.4 - 10.5 mg/dL   GFR calc non Af Amer 24 (*) >90 mL/min   GFR calc Af Amer 28 (*) >90 mL/min   Comment: (NOTE)     The eGFR has been calculated using the CKD EPI equation.     This calculation has not been validated in all clinical situations.     eGFR's persistently <90 mL/min signify possible Chronic Kidney     Disease.  CK     Status: None   Collection Time    01/14/14  5:53 AM      Result Value Ref Range   Total CK 114  7 - 232 U/L  CBC     Status: Abnormal   Collection Time    01/14/14  5:53 AM      Result Value Ref Range   WBC 4.4  4.0 - 10.5 K/uL   RBC 2.77 (*) 4.22 - 5.81 MIL/uL   Hemoglobin 8.6 (*) 13.0 - 17.0 g/dL   HCT 24.6 (*) 39.0 - 52.0 %   MCV 88.8  78.0 - 100.0 fL   MCH 31.0  26.0 - 34.0 pg   MCHC 35.0  30.0 - 36.0 g/dL   RDW 12.1  11.5 - 15.5 %   Platelets 168  150 - 400 K/uL   Comment: DELTA CHECK NOTED     REPEATED TO VERIFY  GLUCOSE, CAPILLARY     Status: Abnormal   Collection Time    01/14/14  9:14 PM      Result Value Ref Range   Glucose-Capillary 127 (*) 70 - 99 mg/dL  BASIC METABOLIC PANEL     Status: Abnormal   Collection Time    01/15/14  3:20 AM      Result Value Ref Range   Sodium 146  137 - 147 mEq/L   Potassium 4.2  3.7 - 5.3 mEq/L   Chloride 108  96 - 112 mEq/L   CO2 23  19 - 32 mEq/L   Glucose, Bld  106 (*) 70 - 99 mg/dL   BUN 28 (*) 6 - 23 mg/dL   Creatinine, Ser 2.62 (*) 0.50 - 1.35 mg/dL   Calcium 8.4  8.4 - 10.5 mg/dL   GFR calc non Af Amer 27 (*) >90 mL/min   GFR calc Af Amer 32 (*) >90 mL/min   Comment: (NOTE)     The eGFR has been calculated using the CKD EPI equation.     This calculation has not been validated in all clinical situations.     eGFR's persistently <90 mL/min signify possible Chronic Kidney     Disease.  CBC     Status: Abnormal   Collection Time    01/15/14  3:20 AM      Result Value Ref Range   WBC 4.4  4.0 - 10.5 K/uL   RBC 2.82 (*) 4.22 - 5.81 MIL/uL   Hemoglobin 8.6 (*) 13.0 - 17.0 g/dL   HCT 25.3 (*) 39.0 - 52.0 %   MCV 89.7  78.0 - 100.0 fL   MCH 30.5  26.0 - 34.0 pg   MCHC 34.0  30.0 - 36.0 g/dL   RDW 12.2  11.5 - 15.5 %   Platelets 205  150 - 400 K/uL  BASIC METABOLIC PANEL     Status: Abnormal   Collection Time    01/16/14  7:00 AM      Result Value Ref Range   Sodium 144  137 - 147 mEq/L   Potassium 4.3  3.7 - 5.3 mEq/L   Chloride 106  96 - 112 mEq/L   CO2 22  19 - 32 mEq/L   Glucose, Bld 106 (*) 70 - 99 mg/dL   BUN 27 (*) 6 - 23 mg/dL   Creatinine, Ser 2.75 (*) 0.50 - 1.35 mg/dL   Calcium 8.8  8.4 - 10.5 mg/dL   GFR calc non Af Amer 26 (*) >90 mL/min   GFR calc Af Amer 30 (*) >90 mL/min   Comment: (NOTE)     The eGFR has been calculated using the CKD EPI equation.     This calculation has not been validated in all clinical situations.     eGFR's persistently <90 mL/min signify possible Chronic Kidney     Disease.  GLUCOSE, CAPILLARY     Status: Abnormal   Collection Time    01/16/14  8:23 PM      Result Value Ref Range   Glucose-Capillary 105 (*) 70 - 99 mg/dL  BASIC METABOLIC PANEL     Status: Abnormal   Collection Time    01/17/14  6:49 AM      Result Value Ref Range   Sodium 138  137 - 147 mEq/L   Potassium 4.4  3.7 - 5.3 mEq/L  Chloride 102  96 - 112 mEq/L   CO2 23  19 - 32 mEq/L   Glucose, Bld 116 (*) 70 - 99 mg/dL    BUN 29 (*) 6 - 23 mg/dL   Creatinine, Ser 2.62 (*) 0.50 - 1.35 mg/dL   Calcium 8.7  8.4 - 10.5 mg/dL   GFR calc non Af Amer 27 (*) >90 mL/min   GFR calc Af Amer 32 (*) >90 mL/min   Comment: (NOTE)     The eGFR has been calculated using the CKD EPI equation.     This calculation has not been validated in all clinical situations.     eGFR's persistently <90 mL/min signify possible Chronic Kidney     Disease.  CBC     Status: Abnormal   Collection Time    01/17/14  7:02 PM      Result Value Ref Range   WBC 5.7  4.0 - 10.5 K/uL   RBC 3.29 (*) 4.22 - 5.81 MIL/uL   Hemoglobin 10.1 (*) 13.0 - 17.0 g/dL   HCT 29.8 (*) 39.0 - 52.0 %   MCV 90.6  78.0 - 100.0 fL   MCH 30.7  26.0 - 34.0 pg   MCHC 33.9  30.0 - 36.0 g/dL   RDW 12.0  11.5 - 15.5 %   Platelets 235  150 - 400 K/uL  CREATININE, SERUM     Status: Abnormal   Collection Time    01/17/14  7:02 PM      Result Value Ref Range   Creatinine, Ser 2.57 (*) 0.50 - 1.35 mg/dL   GFR calc non Af Amer 28 (*) >90 mL/min   GFR calc Af Amer 33 (*) >90 mL/min   Comment: (NOTE)     The eGFR has been calculated using the CKD EPI equation.     This calculation has not been validated in all clinical situations.     eGFR's persistently <90 mL/min signify possible Chronic Kidney     Disease.  CBC WITH DIFFERENTIAL     Status: Abnormal   Collection Time    01/18/14  5:45 AM      Result Value Ref Range   WBC 5.3  4.0 - 10.5 K/uL   RBC 2.85 (*) 4.22 - 5.81 MIL/uL   Hemoglobin 8.8 (*) 13.0 - 17.0 g/dL   HCT 25.6 (*) 39.0 - 52.0 %   MCV 89.8  78.0 - 100.0 fL   MCH 30.9  26.0 - 34.0 pg   MCHC 34.4  30.0 - 36.0 g/dL   RDW 12.0  11.5 - 15.5 %   Platelets 192  150 - 400 K/uL   Neutrophils Relative % 56  43 - 77 %   Neutro Abs 3.0  1.7 - 7.7 K/uL   Lymphocytes Relative 27  12 - 46 %   Lymphs Abs 1.4  0.7 - 4.0 K/uL   Monocytes Relative 8  3 - 12 %   Monocytes Absolute 0.4  0.1 - 1.0 K/uL   Eosinophils Relative 8 (*) 0 - 5 %   Eosinophils  Absolute 0.4  0.0 - 0.7 K/uL   Basophils Relative 1  0 - 1 %   Basophils Absolute 0.1  0.0 - 0.1 K/uL  COMPREHENSIVE METABOLIC PANEL     Status: Abnormal   Collection Time    01/18/14  5:45 AM      Result Value Ref Range   Sodium 142  137 - 147 mEq/L   Potassium 4.5  3.7 - 5.3 mEq/L   Chloride 104  96 - 112 mEq/L   CO2 23  19 - 32 mEq/L   Glucose, Bld 108 (*) 70 - 99 mg/dL   BUN 29 (*) 6 - 23 mg/dL   Creatinine, Ser 2.46 (*) 0.50 - 1.35 mg/dL   Calcium 8.4  8.4 - 10.5 mg/dL   Total Protein 6.3  6.0 - 8.3 g/dL   Albumin 2.9 (*) 3.5 - 5.2 g/dL   AST 10  0 - 37 U/L   ALT 11  0 - 53 U/L   Alkaline Phosphatase 104  39 - 117 U/L   Total Bilirubin <0.2 (*) 0.3 - 1.2 mg/dL   GFR calc non Af Amer 30 (*) >90 mL/min   GFR calc Af Amer 34 (*) >90 mL/min   Comment: (NOTE)     The eGFR has been calculated using the CKD EPI equation.     This calculation has not been validated in all clinical situations.     eGFR's persistently <90 mL/min signify possible Chronic Kidney     Disease.      Physical Exam: Constitutional:  BP 136/80  Pulse 107  Ht _0  (1.905 m)  Wt 186 lb 6.4 oz (84.55 kg)  BMI 23.30 kg/m2  Musculoskeletal: Strength & Muscle Tone: within normal limits Gait & Station: normal Patient leans: N/A  Mental Status Examination;  Patient is casually dressed and fairly groomed.  He appears anxious but cooperative.  His psychomotor activity is slightly increased.  His speech is slow and at times rambling.  His thought processes also lose and circumstantial.  However he is relevant in conversation.  He described his mood as anxious and his affect is mood appropriate.  He denies any suicidal thoughts or homicidal thoughts.  He admitted hallucination and paranoia but they are less intense from the past.  There were no delusions or obsessions at this time.  He has no tremors or shakes but he endorse sometimes jerky movements.  His fund of knowledge is average.  He had some difficulty  remembering things but overall he is able to answer the question.  He is alert and oriented x3.  His attention and concentration is fair.  His insight judgment and impulse control is okay.   Established Problem, Stable/Improving (1), Review of Psycho-Social Stressors (1), Review or order clinical lab tests (1), Decision to obtain old records (1), Review and summation of old records (2), Review of Medication Regimen & Side Effects (2) and Review of New Medication or Change in Dosage (2)  Assessment: Axis I: Schizoaffective disorder with psychotic features  Axis II: Deferred  Axis III:  Past Medical History  Diagnosis Date  . Hyperlipidemia   . Schizoaffective disorder   . H/O: GI bleed   . Seizures   . Depression   . Chronic kidney disease   . Anemia 01/14/2014    Axis IV: Mild to moderate   Plan:  The patient is doing better on Clozaril, venlafaxine and Cogentin.  He's not taking Prolixin which was discontinued and he was admitted on the medical floor.  We will get records from [facility].  He is taking clozapine 600 mg daily, venlafaxine 75 mg twice a day and Cogentin 2 mg at bedtime.  At this time patient is fairly stable on his current psychotropic medication.  He is getting his blood work every 2 weeks.  We do not have any cause her level and hemoglobin A1c.  Ordered these blood work.  In the meantime we will try to get records from [facility].  I also believe he should see a therapist in this office for coping skills.  The patient is getting medication from [place].  Currently he has enough refills for another 30 days.  I will see him again in 3 weeks.Time spent 55 minutes.  More than 50% of the time spent in psychoeducation, counseling and coordination of care.  Discuss safety plan that anytime having active suicidal thoughts or homicidal thoughts then patient need to call 911 or go to the local emergency room.    Justis Closser T., MD 02/10/2014

## 2014-02-22 ENCOUNTER — Telehealth (HOSPITAL_COMMUNITY): Payer: Self-pay | Admitting: *Deleted

## 2014-02-22 NOTE — Telephone Encounter (Signed)
Judyann Munson called from [facility], group home left BW:IOMBTDH from [facility], group home where pt lives.Needs prescription for his Clozaril sent to [place].  Contacted Ms. Bass--Advised her that pt given printed RX on 01/21/14 when d/c'd from hospital.She stated she had them and would fax them to the pharmacy.

## 2014-02-23 ENCOUNTER — Ambulatory Visit (HOSPITAL_COMMUNITY): Payer: Self-pay | Admitting: Psychiatry

## 2014-02-25 ENCOUNTER — Other Ambulatory Visit (HOSPITAL_COMMUNITY): Payer: Self-pay | Admitting: Psychiatry

## 2014-02-25 LAB — CBC WITH DIFFERENTIAL/PLATELET
BASOS ABS: 0.1 10*3/uL (ref 0.0–0.1)
Basophils Relative: 1 % (ref 0–1)
EOS ABS: 0.1 10*3/uL (ref 0.0–0.7)
Eosinophils Relative: 2 % (ref 0–5)
HEMATOCRIT: 33.4 % — AB (ref 39.0–52.0)
HEMOGLOBIN: 11.5 g/dL — AB (ref 13.0–17.0)
Lymphocytes Relative: 28 % (ref 12–46)
Lymphs Abs: 1.7 10*3/uL (ref 0.7–4.0)
MCH: 30.5 pg (ref 26.0–34.0)
MCHC: 34.4 g/dL (ref 30.0–36.0)
MCV: 88.6 fL (ref 78.0–100.0)
MONO ABS: 0.4 10*3/uL (ref 0.1–1.0)
MONOS PCT: 7 % (ref 3–12)
Neutro Abs: 3.8 10*3/uL (ref 1.7–7.7)
Neutrophils Relative %: 62 % (ref 43–77)
Platelets: 194 10*3/uL (ref 150–400)
RBC: 3.77 MIL/uL — ABNORMAL LOW (ref 4.22–5.81)
RDW: 15.1 % (ref 11.5–15.5)
WBC: 6.2 10*3/uL (ref 4.0–10.5)

## 2014-02-26 LAB — COMPREHENSIVE METABOLIC PANEL
ALBUMIN: 4.1 g/dL (ref 3.5–5.2)
ALK PHOS: 93 U/L (ref 39–117)
ALT: 8 U/L (ref 0–53)
AST: 12 U/L (ref 0–37)
BUN: 17 mg/dL (ref 6–23)
CALCIUM: 8.5 mg/dL (ref 8.4–10.5)
CHLORIDE: 103 meq/L (ref 96–112)
CO2: 25 mEq/L (ref 19–32)
CREATININE: 1.36 mg/dL — AB (ref 0.50–1.35)
Glucose, Bld: 101 mg/dL — ABNORMAL HIGH (ref 70–99)
POTASSIUM: 4.3 meq/L (ref 3.5–5.3)
Sodium: 138 mEq/L (ref 135–145)
Total Bilirubin: 0.4 mg/dL (ref 0.2–1.2)
Total Protein: 6.7 g/dL (ref 6.0–8.3)

## 2014-02-26 LAB — HEMOGLOBIN A1C
Hgb A1c MFr Bld: 5.6 % (ref <5.7)
MEAN PLASMA GLUCOSE: 114 mg/dL (ref <117)

## 2014-03-01 ENCOUNTER — Encounter: Payer: Self-pay | Admitting: Diagnostic Neuroimaging

## 2014-03-01 ENCOUNTER — Encounter (INDEPENDENT_AMBULATORY_CARE_PROVIDER_SITE_OTHER): Payer: Self-pay

## 2014-03-01 ENCOUNTER — Ambulatory Visit (INDEPENDENT_AMBULATORY_CARE_PROVIDER_SITE_OTHER): Payer: Medicare Other | Admitting: Diagnostic Neuroimaging

## 2014-03-01 VITALS — BP 109/79 | HR 109 | Temp 98.0°F | Ht 75.0 in | Wt 204.0 lb

## 2014-03-01 DIAGNOSIS — G40909 Epilepsy, unspecified, not intractable, without status epilepticus: Secondary | ICD-10-CM

## 2014-03-01 MED ORDER — LEVETIRACETAM ER 500 MG PO TB24: 500.0000 mg | ORAL_TABLET | Freq: Two times a day (BID) | ORAL | Status: DC

## 2014-03-01 NOTE — Progress Notes (Signed)
[facility]  PATIENT: Brady Hoffman DOB: [birth year]  REFERRING CLINICIAN: Avbuere HISTORY FROM: patient and mother REASON FOR VISIT: new consult   HISTORICAL  CHIEF COMPLAINT:  Chief Complaint  Patient presents with  . Neurologic Problem    hospital f/u, possible sz?    HISTORY OF PRESENT ILLNESS:   48 year old male with schizoaffective disorder, anxiety, here for evaluation of seizure.  01/02/14 patient was at group home, had not been feeling well for a few weeks. He stood up, lost his balance fell and struck his head. Apparently he hit his head lost consciousness and had a seizure. The exact sequence of events is not clear. Patient was taken to the hospital, intubated, treated for seizure disorder and also found to be in acute renal failure. Patient gradually recovered. He was started on levetiracetam 500 mg twice a day. His renal function gradually improve. Patient went to inpatient rehabilitation at L2 date back to his group home.  Since that time no further seizures. Patient did have a "febrile seizure" at age 3 months old. Apparently he had a "drop attack", in the setting of high fever. He was treated with antiseizure medication at that time.  Patient's family history is unknown as he is adopted. Patient never had meningitis or encephalitis. Patient is feeling back to himself. No further seizures since starting antiseizure medication.  Patient's mother is here with him and is asking about other movements consisting of rocking, restless, panic attack type movements.  REVIEW OF SYSTEMS: Full 14 system review of systems performed and notable only for anxiety weight gain.  ALLERGIES: Allergies  Allergen Reactions  . Risperidone And Related     Pt. States head feels like a rock    HOME MEDICATIONS: Outpatient Prescriptions Prior to Visit  Medication Sig Dispense Refill  . aspirin 81 MG chewable tablet Chew 1 tablet (81 mg total) by mouth daily.       Marland Kitchen atorvastatin (LIPITOR) 20 MG tablet Take 1 tablet (20 mg total) by mouth at bedtime.  30 tablet  1  . benztropine (COGENTIN) 2 MG tablet Take 1 tablet (2 mg total) by mouth at bedtime.  30 tablet  1  . cloZAPine (CLOZARIL) 100 MG tablet Take 1 tablet (100 mg total) by mouth 2 (two) times daily.  60 tablet  1  . clozapine (CLOZARIL) 200 MG tablet Take 2 tablets (400 mg total) by mouth at bedtime.  60 tablet  1  . folic acid (FOLVITE) 1 MG tablet Take 1 tablet (1 mg total) by mouth daily.  30 tablet  1  . omeprazole (PRILOSEC) 40 MG capsule Take 1 capsule (40 mg total) by mouth daily.  30 capsule  1  . venlafaxine (EFFEXOR) 75 MG tablet Take 1 tablet (75 mg total) by mouth 2 (two) times daily with a meal.  60 tablet  1  . levETIRAcetam (KEPPRA XR) 500 MG 24 hr tablet Take 1 tablet (500 mg total) by mouth 2 (two) times daily.  60 tablet  1   No facility-administered medications prior to visit.    PAST MEDICAL HISTORY: Past Medical History  Diagnosis Date  . Hyperlipidemia   . Schizoaffective disorder   . H/O: GI bleed   . Seizures   . Depression   . Chronic kidney disease   . Anemia 01/14/2014  . Anxiety attack     PAST SURGICAL HISTORY: Past Surgical History  Procedure Laterality Date  . Mole removal    . Wisdom tooth extraction    .  Esophagogastroduodenoscopy  11/01/2011    Procedure: ESOPHAGOGASTRODUODENOSCOPY (EGD);  Surgeon: Beryle Beams;  Location: [facility];  Service: Endoscopy;  Laterality: N/A;    FAMILY HISTORY: Family History  Problem Relation Age of Onset  . Adopted: Yes    SOCIAL HISTORY:  History   Social History  . Marital Status: Single    Spouse Name: N/A    Number of Children: 0  . Years of Education: College   Occupational History  .  Other   Social History Main Topics  . Smoking status: Never Smoker   . Smokeless tobacco: Never Used  . Alcohol Use: No  . Drug Use: No  . Sexual Activity: No   Other Topics Concern  . Not on file    Social History Narrative   Patient lives in [place].   Caffeine Use: 2 cups daily     PHYSICAL EXAM  Filed Vitals:   03/01/14 1038  BP: 109/79  Pulse: 109  Temp: 98 F (36.7 C)  TempSrc: Oral  Height: 6\' 3"  (1.905 m)  Weight: 204 lb (92.534 kg)    Not recorded    Body mass index is 25.5 kg/(m^2).  GENERAL EXAM: Patient is in no distress; well developed, nourished and groomed; neck is supple  CARDIOVASCULAR: Regular rate and rhythm, no murmurs, no carotid bruits  NEUROLOGIC: MENTAL STATUS: MILD PSYCHOMOTOR SLOWING. Awake, alert, oriented to person, place and time, recent and remote memory intact, normal attention and concentration, language fluent, comprehension intact, naming intact, fund of knowledge appropriate CRANIAL NERVE: no papilledema on fundoscopic exam, pupils equal and reactive to light, visual fields full to confrontation, extraocular muscles intact, no nystagmus, facial sensation and strength symmetric, hearing intact, palate elevates symmetrically, uvula midline, shoulder shrug symmetric, tongue midline. MOTOR: normal bulk and tone, full strength in the BUE, BLE SENSORY: normal and symmetric to light touch, temperature, vibration COORDINATION: finger-nose-finger, fine finger movements, heel-shin normal REFLEXES: deep tendon reflexes present and symmetric GAIT/STATION: narrow based gait; able to walk on toes, heels and tandem; romberg is negative   DIAGNOSTIC DATA (LABS, IMAGING, TESTING) - I reviewed patient records, labs, notes, testing and imaging myself where available.  Lab Results  Component Value Date   WBC 6.2 02/25/2014   HGB 11.5* 02/25/2014   HCT 33.4* 02/25/2014   MCV 88.6 02/25/2014   PLT 194 02/25/2014      Component Value Date/Time   NA 138 02/25/2014 1109   K 4.3 02/25/2014 1109   CL 103 02/25/2014 1109   CO2 25 02/25/2014 1109   GLUCOSE 101* 02/25/2014 1109   BUN 17 02/25/2014 1109   CREATININE 1.36* 02/25/2014 1109    CREATININE 2.46* 01/18/2014 0545   CALCIUM 8.5 02/25/2014 1109   PROT 6.7 02/25/2014 1109   ALBUMIN 4.1 02/25/2014 1109   AST 12 02/25/2014 1109   ALT 8 02/25/2014 1109   ALKPHOS 93 02/25/2014 1109   BILITOT 0.4 02/25/2014 1109   GFRNONAA 30* 01/18/2014 0545   GFRAA 34* 01/18/2014 0545   Lab Results  Component Value Date   CHOL 139 10/20/2009   HDL 37* 10/20/2009   LDLCALC 74 10/20/2009   TRIG 176* 01/06/2014   CHOLHDL 3.8 Ratio 10/20/2009   Lab Results  Component Value Date   HGBA1C 5.6 02/25/2014   No results found for this basename: VITAMINB12   No results found for this basename: TSH    01/03/14 EEG - normal asleep  01/07/14 MRI brain - No acute or focal intracranial  finding. Brain atrophy premature for age.   ASSESSMENT AND PLAN  48 y.o. year old male here with schizoaffective disorder, with new onset possible seizure in setting of acute renal failure. Now doing well with gradually improving renal function. Tolerating LEV XR 500mg  BID; no more seizures.  PLAN: - continue LEV XR 500mg  BID  Meds ordered this encounter  Medications  . levETIRAcetam (KEPPRA XR) 500 MG 24 hr tablet    Sig: Take 1 tablet (500 mg total) by mouth 2 (two) times daily.    Dispense:  60 tablet    Refill:  12    Order Specific Question:  Supervising Provider    Answer:  Alger Simons T [[id number]]   Return in about 6 months (around 08/31/2014) for with Charlott Holler or Penumalli.    Penni Bombard, MD 03/20/3789, 24:09 AM Certified in Neurology, Neurophysiology and Neuroimaging  [facility] [address] [place],  [address] [phone]

## 2014-03-01 NOTE — Patient Instructions (Signed)
Continue current medications. 

## 2014-03-02 LAB — CLOZAPINE (CLOZARIL)
Clozapine Lvl: 387 mcg/L
NorClozapine: 222 mcg/L (ref 25–400)

## 2014-03-16 ENCOUNTER — Ambulatory Visit (INDEPENDENT_AMBULATORY_CARE_PROVIDER_SITE_OTHER): Payer: Medicare Other | Admitting: Psychiatry

## 2014-03-16 ENCOUNTER — Encounter (HOSPITAL_COMMUNITY): Payer: Self-pay | Admitting: Psychiatry

## 2014-03-16 VITALS — BP 137/81 | HR 100 | Ht 75.0 in | Wt 206.6 lb

## 2014-03-16 DIAGNOSIS — F259 Schizoaffective disorder, unspecified: Secondary | ICD-10-CM

## 2014-03-16 MED ORDER — HYDROXYZINE PAMOATE 25 MG PO CAPS: 25.0000 mg | ORAL_CAPSULE | ORAL | Status: DC | PRN

## 2014-03-16 MED ORDER — CLOZAPINE 100 MG PO TABS: 100.0000 mg | ORAL_TABLET | Freq: Two times a day (BID) | ORAL | Status: DC

## 2014-03-16 MED ORDER — BENZTROPINE MESYLATE 2 MG PO TABS: 2.0000 mg | ORAL_TABLET | Freq: Every day | ORAL | Status: DC

## 2014-03-16 MED ORDER — VENLAFAXINE HCL 75 MG PO TABS: 75.0000 mg | ORAL_TABLET | Freq: Two times a day (BID) | ORAL | Status: DC

## 2014-03-16 MED ORDER — CLOZAPINE 200 MG PO TABS: 400.0000 mg | ORAL_TABLET | Freq: Every day | ORAL | Status: DC

## 2014-03-16 NOTE — Progress Notes (Signed)
[place] [phone] Progress Note  Brady Hoffman [id number] 48 y.o.  03/16/2014 3:38 PM  Chief Complaint:  I saw a neurologist recently.  I'm doing better.      History of Present Illness:  Brady Hoffman came for his followup appointment with his mother.  He was seen first time on March 12 his initial evaluation.  He was not happy [facility].  He is complying with his clozapine.  He denied any hallucination or any paranoia.  However he admitted sometime panic attack.  He has at least one to 2 panic attacks a week.  Usually his panic attack last for a few minutes.  He is sleeping better.  He denies any irritability, anger or any severe mood swings.  His mother endorsed much improvement with clozapine .  Patient does not have any side effects including any tremors, shakes, doing .  He does not appear to be sedated.  He'll also continue his current psychotropic medication.  As per mother he has no behavior problem since he is taking clozapine.  Patient is not drinking or using any illicit substances.  The patient has a lot of support from his adopted mother.  Recently he seen his neurologist and there has been no changes.  Receive his blood work.  His creatinine is improved from the past.  He has no major abnormalities in his CBC.  His clozapine level is within normal limits.  Suicidal Ideation: No Plan Formed: No Patient has means to carry out plan: No  Homicidal Ideation: No Plan Formed: No Patient has means to carry out plan: No  Past Psychiatric History/Hospitalization(s) Patient has history of multiple psychiatric hospitalization admitted at [facility], [facility], [place], [place] and in [facility] [facility].  He is a stay of suicidal attempt by cutting his neck which did not require any medical intervention and cutting his wrist with a broken light bulb to respond to internal stimuli.  History of paranoia, hallucination and disorganized thinking.  In the past he had tried  Haldol, lithium, Prolixin, Risperdal, Geodon and Seroquel.  He was seeing psychiatrist and provider at [facility]. Anxiety: Yes Bipolar Disorder: No Depression: Yes Mania: No Psychosis: Yes Schizophrenia: Yes Personality Disorder: No Hospitalization for psychiatric illness: Yes History of Electroconvulsive Shock Therapy: No Prior Suicide Attempts: Yes  Medical History; Patient has history of hyperlipidemia, history of GI bleed, anemia, kidney disease, seizures.  He was recently discharge from medical floor due to toxic metabolic encephalopathy and seizures.  His primary care physician is Dr.Avaberry.  He has high BUN and creatinine.   Review of Systems: Psychiatric: Agitation: No Hallucination: No Depressed Mood: No Insomnia: No Hypersomnia: No Altered Concentration: No Feels Worthless: No Grandiose Ideas: No Belief In Special Powers: No New/Increased Substance Abuse: No Compulsions: No  Neurologic: Headache: No Seizure: Yes Paresthesias: No   Collection Time    01/03/14  3:55 AM      Result Value Ref Range   Glucose-Capillary 76  70 - 99 mg/dL  RENAL FUNCTION PANEL     Status: Abnormal   Collection Time    01/03/14  4:23 AM      Result Value Ref Range   Sodium 140  137 - 147 mEq/L   Potassium 4.2  3.7 - 5.3 mEq/L   Comment: DELTA CHECK NOTED   Chloride 101  96 - 112 mEq/L   Comment: DELTA CHECK NOTED   CO2 17 (*) 19 - 32 mEq/L   Glucose, Bld 88  70 - 99 mg/dL  BUN 89 (*) 6 - 23 mg/dL   Creatinine, Ser 13.26 (*) 0.50 - 1.35 mg/dL   Comment: DELTA CHECK NOTED   Calcium 7.7 (*) 8.4 - 10.5 mg/dL   Phosphorus 6.3 (*) 2.3 - 4.6 mg/dL   Albumin 3.0 (*) 3.5 - 5.2 g/dL   GFR calc non Af Amer 4 (*) >90 mL/min   GFR calc Af Amer 4 (*) >90 mL/min   Comment: (NOTE)     The eGFR has been calculated using the CKD EPI equation.     This calculation has not been validated in all clinical situations.     eGFR's persistently <90 mL/min signify possible Chronic Kidney      Disease.  CBC     Status: Abnormal   Collection Time    01/03/14  4:23 AM      Result Value Ref Range   WBC 6.6  4.0 - 10.5 K/uL   RBC 3.34 (*) 4.22 - 5.81 MIL/uL   Hemoglobin 10.4 (*) 13.0 - 17.0 g/dL   HCT 27.8 (*) 39.0 - 52.0 %   MCV 83.2  78.0 - 100.0 fL   MCH 31.1  26.0 - 34.0 pg   MCHC 37.4 (*) 30.0 - 36.0 g/dL   Comment: RULED OUT INTERFERING SUBSTANCES   RDW 12.5  11.5 - 15.5 %   Platelets 135 (*) 150 - 400 K/uL   Comment: DELTA CHECK NOTED     REPEATED TO VERIFY  MAGNESIUM     Status: Abnormal   Collection Time    01/03/14  4:23 AM      Result Value Ref Range   Magnesium 3.1 (*) 1.5 - 2.5 mg/dL  APTT     Status: None   Collection Time    01/03/14  4:23 AM      Result Value Ref Range   aPTT 31  24 - 37 seconds  GLUCOSE, CAPILLARY     Status: None   Collection Time    01/03/14  7:47 AM      Result Value Ref Range   Glucose-Capillary 85  70 - 99 mg/dL  TROPONIN I     Status: None   Collection Time    01/03/14  7:54 AM      Result Value Ref Range   Troponin I <0.30  <0.30 ng/mL   Comment:            Due to the release kinetics of cTnI,     a negative result within the first hours     of the onset of symptoms does not rule out     myocardial infarction with certainty.     If myocardial infarction is still suspected,     repeat the test at appropriate intervals.  ALCOHOL, METHYL (METHANOL), BLOOD     Status: None   Collection Time    01/03/14  8:56 AM      Result Value Ref Range   Methanol Lvl NEGATIVE     Comment: Performed at [facility]     Status: None   Collection Time    01/03/14  8:56 AM      Result Value Ref Range   Ethylene Glycol Lvl <5  <5 mg/dL   Comment: Performed at [facility]     Status: Abnormal   Collection Time    01/03/14  8:56 AM      Result Value Ref Range   Osmolality 303 (*) 275 - 300 mOsm/kg  Comment: Performed at [facility]  PROCALCITONIN     Status: None    Collection Time    01/03/14 11:27 AM      Result Value Ref Range   Procalcitonin 0.45     Comment:            Interpretation:     PCT (Procalcitonin) <= 0.5 ng/mL:     Systemic infection (sepsis) is not likely.     Local bacterial infection is possible.     (NOTE)             ICU PCT Algorithm               Non ICU PCT Algorithm        ----------------------------     ------------------------------             PCT < 0.25 ng/mL                 PCT < 0.1 ng/mL         Stopping of antibiotics            Stopping of antibiotics           strongly encouraged.               strongly encouraged.        ----------------------------     ------------------------------           PCT level decrease by               PCT < 0.25 ng/mL           >= 80% from peak PCT           OR PCT 0.25 - 0.5 ng/mL          Stopping of antibiotics                                                 encouraged.         Stopping of antibiotics               encouraged.        ----------------------------     ------------------------------           PCT level decrease by              PCT >= 0.25 ng/mL           < 80% from peak PCT            AND PCT >= 0.5 ng/mL            Continuing antibiotics                                                  encouraged.           Continuing antibiotics                encouraged.        ----------------------------     ------------------------------         PCT level increase compared          PCT > 0.5 ng/mL  with peak PCT AND              PCT >= 0.5 ng/mL             Escalation of antibiotics                                              strongly encouraged.          Escalation of antibiotics            strongly encouraged.  GLUCOSE, CAPILLARY     Status: None   Collection Time    01/03/14 12:10 PM      Result Value Ref Range   Glucose-Capillary 74  70 - 99 mg/dL  URINALYSIS, ROUTINE W REFLEX MICROSCOPIC     Status: Abnormal   Collection Time    01/03/14  1:00 PM       Result Value Ref Range   Color, Urine YELLOW  YELLOW   APPearance CLEAR  CLEAR   Specific Gravity, Urine 1.009  1.005 - 1.030   pH 6.0  5.0 - 8.0   Glucose, UA NEGATIVE  NEGATIVE mg/dL   Hgb urine dipstick MODERATE (*) NEGATIVE   Bilirubin Urine NEGATIVE  NEGATIVE   Ketones, ur NEGATIVE  NEGATIVE mg/dL   Protein, ur NEGATIVE  NEGATIVE mg/dL   Urobilinogen, UA 0.2  0.0 - 1.0 mg/dL   Nitrite NEGATIVE  NEGATIVE   Leukocytes, UA SMALL (*) NEGATIVE  URINE MICROSCOPIC-ADD ON     Status: Abnormal   Collection Time    01/03/14  1:00 PM      Result Value Ref Range   Squamous Epithelial / LPF FEW (*) RARE   WBC, UA 7-10  <3 WBC/hpf   RBC / HPF 3-6  <3 RBC/hpf   Bacteria, UA FEW (*) RARE  URINE CULTURE     Status: None   Collection Time    01/03/14  1:00 PM      Result Value Ref Range   Specimen Description URINE, CATHETERIZED     Special Requests NONE     Culture  Setup Time       Value: 01/03/2014 20:32     Performed at [facility]       Value: NO GROWTH     Performed at [facility]   Culture       Value: NO GROWTH     Performed at [facility]   Report Status 01/04/2014 FINAL    MAGNESIUM     Status: Abnormal   Collection Time    01/03/14  1:30 PM      Result Value Ref Range   Magnesium 2.7 (*) 1.5 - 2.5 mg/dL  PHOSPHORUS     Status: None   Collection Time    01/03/14  1:30 PM      Result Value Ref Range   Phosphorus 4.3  2.3 - 4.6 mg/dL  OCCULT BLOOD GASTRIC / DUODENUM (SPECIMEN CUP)     Status: Abnormal   Collection Time    01/03/14  1:47 PM      Result Value Ref Range   pH, Gastric NOT DONE     Occult Blood, Gastric POSITIVE (*) NEGATIVE  GLUCOSE, CAPILLARY     Status: None   Collection Time    01/03/14  3:34 PM  Result Value Ref Range   Glucose-Capillary 84  70 - 99 mg/dL  RENAL FUNCTION PANEL     Status: Abnormal   Collection Time    01/03/14  4:00 PM      Result Value Ref Range   Sodium 143  137 - 147 mEq/L    Potassium 3.9  3.7 - 5.3 mEq/L   Chloride 104  96 - 112 mEq/L   CO2 21  19 - 32 mEq/L   Glucose, Bld 82  70 - 99 mg/dL   BUN 54 (*) 6 - 23 mg/dL   Comment: DELTA CHECK NOTED   Creatinine, Ser 8.13 (*) 0.50 - 1.35 mg/dL   Comment: DELTA CHECK NOTED   Calcium 7.7 (*) 8.4 - 10.5 mg/dL   Phosphorus 4.3  2.3 - 4.6 mg/dL   Albumin 2.8 (*) 3.5 - 5.2 g/dL   GFR calc non Af Amer 7 (*) >90 mL/min   GFR calc Af Amer 8 (*) >90 mL/min   Comment: (NOTE)     The eGFR has been calculated using the CKD EPI equation.     This calculation has not been validated in all clinical situations.     eGFR's persistently <90 mL/min signify possible Chronic Kidney     Disease.  DIC (DISSEMINATED INTRAVASCULAR COAGULATION) PANEL     Status: Abnormal   Collection Time    01/03/14  4:00 PM      Result Value Ref Range   Prothrombin Time 14.5  11.6 - 15.2 seconds   INR 1.15  0.00 - 1.49   aPTT 32  24 - 37 seconds   Fibrinogen 455  204 - 475 mg/dL   D-Dimer, Quant 5.72 (*) 0.00 - 0.48 ug/mL-FEU   Comment:            AT THE INHOUSE ESTABLISHED CUTOFF     VALUE OF 0.48 ug/mL FEU,     THIS ASSAY HAS BEEN DOCUMENTED     IN THE LITERATURE TO HAVE     A SENSITIVITY AND NEGATIVE     PREDICTIVE VALUE OF AT LEAST     98 TO 99%.  THE TEST RESULT     SHOULD BE CORRELATED WITH     AN ASSESSMENT OF THE CLINICAL     PROBABILITY OF DVT / VTE.   Platelets 121 (*) 150 - 400 K/uL   Smear Review NO SCHISTOCYTES SEEN    CBC     Status: Abnormal   Collection Time    01/03/14  6:00 PM      Result Value Ref Range   WBC 5.0  4.0 - 10.5 K/uL   RBC 3.31 (*) 4.22 - 5.81 MIL/uL   Hemoglobin 10.3 (*) 13.0 - 17.0 g/dL   HCT 27.7 (*) 39.0 - 52.0 %   MCV 83.7  78.0 - 100.0 fL   MCH 31.1  26.0 - 34.0 pg   MCHC 37.2 (*) 30.0 - 36.0 g/dL   Comment: RULED OUT INTERFERING SUBSTANCES   RDW 12.6  11.5 - 15.5 %   Platelets 114 (*) 150 - 400 K/uL  GLUCOSE, CAPILLARY     Status: None   Collection Time    01/03/14  7:06 PM      Result  Value Ref Range   Glucose-Capillary 76  70 - 99 mg/dL  GLUCOSE, CAPILLARY     Status: None   Collection Time    01/04/14 12:08 AM      Result Value Ref Range   Glucose-Capillary  82  70 - 99 mg/dL  MAGNESIUM     Status: Abnormal   Collection Time    01/04/14 12:50 AM      Result Value Ref Range   Magnesium 2.6 (*) 1.5 - 2.5 mg/dL  PHOSPHORUS     Status: None   Collection Time    01/04/14 12:50 AM      Result Value Ref Range   Phosphorus 2.8  2.3 - 4.6 mg/dL  BLOOD GAS, ARTERIAL     Status: Abnormal   Collection Time    01/04/14  2:43 AM      Result Value Ref Range   FIO2 0.30     Delivery systems VENTILATOR     Mode PRESSURE REGULATED VOLUME CONTROL     VT 650     Rate 12     Peep/cpap 5.0     pH, Arterial 7.490 (*) 7.350 - 7.450   pCO2 arterial 28.2 (*) 35.0 - 45.0 mmHg   pO2, Arterial 143.0 (*) 80.0 - 100.0 mmHg   Bicarbonate 21.5  20.0 - 24.0 mEq/L   TCO2 22.4  0 - 100 mmol/L   Acid-base deficit 1.6  0.0 - 2.0 mmol/L   O2 Saturation 97.6     Patient temperature 96.7     Collection site RIGHT RADIAL     Drawn by [id number]     Sample type ARTERIAL DRAW     Allens test (pass/fail) PASS  PASS  RENAL FUNCTION PANEL     Status: Abnormal   Collection Time    01/04/14  3:40 AM      Result Value Ref Range   Sodium 140  137 - 147 mEq/L   Potassium 3.6 (*) 3.7 - 5.3 mEq/L   Chloride 102  96 - 112 mEq/L   CO2 22  19 - 32 mEq/L   Glucose, Bld 85  70 - 99 mg/dL   BUN 32 (*) 6 - 23 mg/dL   Comment: DELTA CHECK NOTED   Creatinine, Ser 5.23 (*) 0.50 - 1.35 mg/dL   Comment: DELTA CHECK NOTED   Calcium 7.4 (*) 8.4 - 10.5 mg/dL   Phosphorus 3.0  2.3 - 4.6 mg/dL   Albumin 2.6 (*) 3.5 - 5.2 g/dL   GFR calc non Af Amer 12 (*) >90 mL/min   GFR calc Af Amer 14 (*) >90 mL/min   Comment: (NOTE)     The eGFR has been calculated using the CKD EPI equation.     This calculation has not been validated in all clinical situations.     eGFR's persistently <90 mL/min signify possible Chronic  Kidney     Disease.  MAGNESIUM     Status: None   Collection Time    01/04/14  3:42 AM      Result Value Ref Range   Magnesium 2.5  1.5 - 2.5 mg/dL  APTT     Status: Abnormal   Collection Time    01/04/14  3:42 AM      Result Value Ref Range   aPTT 38 (*) 24 - 37 seconds   Comment:            IF BASELINE aPTT IS ELEVATED,     SUGGEST PATIENT RISK ASSESSMENT     BE USED TO DETERMINE APPROPRIATE     ANTICOAGULANT THERAPY.  CBC     Status: Abnormal   Collection Time    01/04/14  3:42 AM      Result Value Ref Range  WBC 3.7 (*) 4.0 - 10.5 K/uL   RBC 3.25 (*) 4.22 - 5.81 MIL/uL   Hemoglobin 10.1 (*) 13.0 - 17.0 g/dL   HCT 27.2 (*) 39.0 - 52.0 %   MCV 83.7  78.0 - 100.0 fL   MCH 31.1  26.0 - 34.0 pg   MCHC 37.1 (*) 30.0 - 36.0 g/dL   Comment: RULED OUT INTERFERING SUBSTANCES   RDW 12.8  11.5 - 15.5 %   Platelets 123 (*) 150 - 400 K/uL  PROCALCITONIN     Status: None   Collection Time    01/04/14  3:42 AM      Result Value Ref Range   Procalcitonin 0.31     Comment:            Interpretation:     PCT (Procalcitonin) <= 0.5 ng/mL:     Systemic infection (sepsis) is not likely.     Local bacterial infection is possible.     (NOTE)             ICU PCT Algorithm               Non ICU PCT Algorithm        ----------------------------     ------------------------------             PCT < 0.25 ng/mL                 PCT < 0.1 ng/mL         Stopping of antibiotics            Stopping of antibiotics           strongly encouraged.               strongly encouraged.        ----------------------------     ------------------------------           PCT level decrease by               PCT < 0.25 ng/mL           >= 80% from peak PCT           OR PCT 0.25 - 0.5 ng/mL          Stopping of antibiotics                                                 encouraged.         Stopping of antibiotics               encouraged.        ----------------------------     ------------------------------            PCT level decrease by              PCT >= 0.25 ng/mL           < 80% from peak PCT            AND PCT >= 0.5 ng/mL            Continuing antibiotics  encouraged.           Continuing antibiotics                encouraged.        ----------------------------     ------------------------------         PCT level increase compared          PCT > 0.5 ng/mL             with peak PCT AND              PCT >= 0.5 ng/mL             Escalation of antibiotics                                              strongly encouraged.          Escalation of antibiotics            strongly encouraged.  GLUCOSE, CAPILLARY     Status: None   Collection Time    01/04/14  4:12 AM      Result Value Ref Range   Glucose-Capillary 82  70 - 99 mg/dL   Comment 1 Documented in Chart     Comment 2 Notify RN    GLUCOSE, CAPILLARY     Status: None   Collection Time    01/04/14  7:42 AM      Result Value Ref Range   Glucose-Capillary 76  70 - 99 mg/dL  CK     Status: None   Collection Time    01/04/14 11:12 AM      Result Value Ref Range   Total CK 167  7 - 232 U/L  MAGNESIUM     Status: None   Collection Time    01/04/14 11:12 AM      Result Value Ref Range   Magnesium 2.4  1.5 - 2.5 mg/dL  PHOSPHORUS     Status: None   Collection Time    01/04/14 11:12 AM      Result Value Ref Range   Phosphorus 3.9  2.3 - 4.6 mg/dL  MAGNESIUM     Status: None   Collection Time    01/04/14 11:17 AM      Result Value Ref Range   Magnesium 2.4  1.5 - 2.5 mg/dL  PHOSPHORUS     Status: None   Collection Time    01/04/14 11:17 AM      Result Value Ref Range   Phosphorus 3.9  2.3 - 4.6 mg/dL  GLUCOSE, CAPILLARY     Status: None   Collection Time    01/04/14 11:33 AM      Result Value Ref Range   Glucose-Capillary 84  70 - 99 mg/dL  GLUCOSE, CAPILLARY     Status: None   Collection Time    01/04/14  3:34 PM      Result Value Ref Range   Glucose-Capillary 84  70 -  99 mg/dL  GLUCOSE, CAPILLARY     Status: None   Collection Time    01/04/14  6:50 PM      Result Value Ref Range   Glucose-Capillary 80  70 - 99 mg/dL  MAGNESIUM     Status: None   Collection Time    01/04/14 10:14 PM      Result Value  Ref Range   Magnesium 2.4  1.5 - 2.5 mg/dL  PHOSPHORUS     Status: Abnormal   Collection Time    01/04/14 10:14 PM      Result Value Ref Range   Phosphorus 5.7 (*) 2.3 - 4.6 mg/dL  GLUCOSE, CAPILLARY     Status: None   Collection Time    01/04/14 11:51 PM      Result Value Ref Range   Glucose-Capillary 75  70 - 99 mg/dL  MAGNESIUM     Status: None   Collection Time    01/05/14  1:30 AM      Result Value Ref Range   Magnesium 2.4  1.5 - 2.5 mg/dL  PHOSPHORUS     Status: Abnormal   Collection Time    01/05/14  1:30 AM      Result Value Ref Range   Phosphorus 6.2 (*) 2.3 - 4.6 mg/dL  GLUCOSE, CAPILLARY     Status: None   Collection Time    01/05/14  3:54 AM      Result Value Ref Range   Glucose-Capillary 83  70 - 99 mg/dL  MAGNESIUM     Status: None   Collection Time    01/05/14  4:10 AM      Result Value Ref Range   Magnesium 2.4  1.5 - 2.5 mg/dL  APTT     Status: None   Collection Time    01/05/14  4:10 AM      Result Value Ref Range   aPTT 36  24 - 37 seconds  CBC     Status: Abnormal   Collection Time    01/05/14  4:10 AM      Result Value Ref Range   WBC 6.7  4.0 - 10.5 K/uL   RBC 3.14 (*) 4.22 - 5.81 MIL/uL   Hemoglobin 9.7 (*) 13.0 - 17.0 g/dL   HCT 27.4 (*) 39.0 - 52.0 %   MCV 87.3  78.0 - 100.0 fL   MCH 30.9  26.0 - 34.0 pg   MCHC 35.4  30.0 - 36.0 g/dL   RDW 13.2  11.5 - 15.5 %   Platelets 124 (*) 150 - 400 K/uL  BASIC METABOLIC PANEL     Status: Abnormal   Collection Time    01/05/14  4:10 AM      Result Value Ref Range   Sodium 144  137 - 147 mEq/L   Potassium 4.0  3.7 - 5.3 mEq/L   Chloride 106  96 - 112 mEq/L   CO2 19  19 - 32 mEq/L   Glucose, Bld 90  70 - 99 mg/dL   BUN 30 (*) 6 - 23 mg/dL   Creatinine,  Ser 6.17 (*) 0.50 - 1.35 mg/dL   Calcium 7.5 (*) 8.4 - 10.5 mg/dL   GFR calc non Af Amer 10 (*) >90 mL/min   GFR calc Af Amer 11 (*) >90 mL/min   Comment: (NOTE)     The eGFR has been calculated using the CKD EPI equation.     This calculation has not been validated in all clinical situations.     eGFR's persistently <90 mL/min signify possible Chronic Kidney     Disease.  PROCALCITONIN     Status: None   Collection Time    01/05/14  4:10 AM      Result Value Ref Range   Procalcitonin 0.33     Comment:  Interpretation:     PCT (Procalcitonin) <= 0.5 ng/mL:     Systemic infection (sepsis) is not likely.     Local bacterial infection is possible.     (NOTE)             ICU PCT Algorithm               Non ICU PCT Algorithm        ----------------------------     ------------------------------             PCT < 0.25 ng/mL                 PCT < 0.1 ng/mL         Stopping of antibiotics            Stopping of antibiotics           strongly encouraged.               strongly encouraged.        ----------------------------     ------------------------------           PCT level decrease by               PCT < 0.25 ng/mL           >= 80% from peak PCT           OR PCT 0.25 - 0.5 ng/mL          Stopping of antibiotics                                                 encouraged.         Stopping of antibiotics               encouraged.        ----------------------------     ------------------------------           PCT level decrease by              PCT >= 0.25 ng/mL           < 80% from peak PCT            AND PCT >= 0.5 ng/mL            Continuing antibiotics                                                  encouraged.           Continuing antibiotics                encouraged.        ----------------------------     ------------------------------         PCT level increase compared          PCT > 0.5 ng/mL             with peak PCT AND              PCT >= 0.5 ng/mL              Escalation of antibiotics  strongly encouraged.          Escalation of antibiotics            strongly encouraged.  GLUCOSE, CAPILLARY     Status: Abnormal   Collection Time    01/05/14  8:03 AM      Result Value Ref Range   Glucose-Capillary 131 (*) 70 - 99 mg/dL  LACTIC ACID, PLASMA     Status: None   Collection Time    01/05/14  9:33 AM      Result Value Ref Range   Lactic Acid, Venous 0.6  0.5 - 2.2 mmol/L  GLUCOSE, CAPILLARY     Status: Abnormal   Collection Time    01/05/14 11:26 AM      Result Value Ref Range   Glucose-Capillary 115 (*) 70 - 99 mg/dL  GLUCOSE, CAPILLARY     Status: Abnormal   Collection Time    01/05/14  3:46 PM      Result Value Ref Range   Glucose-Capillary 104 (*) 70 - 99 mg/dL  GLUCOSE, CAPILLARY     Status: None   Collection Time    01/05/14  7:14 PM      Result Value Ref Range   Glucose-Capillary 98  70 - 99 mg/dL  GLUCOSE, CAPILLARY     Status: None   Collection Time    01/05/14 11:58 PM      Result Value Ref Range   Glucose-Capillary 95  70 - 99 mg/dL  GLUCOSE, CAPILLARY     Status: Abnormal   Collection Time    01/06/14  3:52 AM      Result Value Ref Range   Glucose-Capillary 113 (*) 70 - 99 mg/dL  BASIC METABOLIC PANEL     Status: Abnormal   Collection Time    01/06/14  5:00 AM      Result Value Ref Range   Sodium 149 (*) 137 - 147 mEq/L   Potassium 4.2  3.7 - 5.3 mEq/L   Chloride 111  96 - 112 mEq/L   CO2 19  19 - 32 mEq/L   Glucose, Bld 119 (*) 70 - 99 mg/dL   BUN 37 (*) 6 - 23 mg/dL   Creatinine, Ser 7.23 (*) 0.50 - 1.35 mg/dL   Calcium 8.0 (*) 8.4 - 10.5 mg/dL   GFR calc non Af Amer 8 (*) >90 mL/min   GFR calc Af Amer 9 (*) >90 mL/min   Comment: (NOTE)     The eGFR has been calculated using the CKD EPI equation.     This calculation has not been validated in all clinical situations.     eGFR's persistently <90 mL/min signify possible Chronic Kidney     Disease.  CBC      Status: Abnormal   Collection Time    01/06/14  5:00 AM      Result Value Ref Range   WBC 6.9  4.0 - 10.5 K/uL   RBC 3.15 (*) 4.22 - 5.81 MIL/uL   Hemoglobin 9.7 (*) 13.0 - 17.0 g/dL   HCT 27.8 (*) 39.0 - 52.0 %   MCV 88.3  78.0 - 100.0 fL   MCH 30.8  26.0 - 34.0 pg   MCHC 34.9  30.0 - 36.0 g/dL   RDW 13.5  11.5 - 15.5 %   Platelets 131 (*) 150 - 400 K/uL  GLUCOSE, CAPILLARY     Status: Abnormal   Collection Time    01/06/14  8:07 AM  Result Value Ref Range   Glucose-Capillary 116 (*) 70 - 99 mg/dL  PROTEIN ELECTROPHORESIS, SERUM     Status: Abnormal   Collection Time    01/06/14  8:30 AM      Result Value Ref Range   Total Protein ELP 4.9 (*) 6.0 - 8.3 g/dL   Albumin ELP 52.9 (*) 55.8 - 66.1 %   Alpha-1-Globulin 9.7 (*) 2.9 - 4.9 %   Alpha-2-Globulin 15.6 (*) 7.1 - 11.8 %   Beta Globulin 6.8  4.7 - 7.2 %   Beta 2 6.1  3.2 - 6.5 %   Gamma Globulin 8.9 (*) 11.1 - 18.8 %   M-Spike, % NOT DETECTED     SPE Interp. (NOTE)     Comment: Nonspecific pattern associated with slightly decreased gamma     globulins.     Reviewed by Odis Hollingshead, MD, PhD, FCAP (Electronic Signature on     File)   Comment (NOTE)     Comment: ---------------     Serum protein electrophoresis is a useful screening procedure in the     detection of various pathophysiologic states such as inflammation,     gammopathies, protein loss and other dysproteinemias.  Immunofixation     electrophoresis (IFE) is a more sensitive technique for the     identification of M-proteins found in patients with monoclonal     gammopathy of unknown significance (MGUS), amyloidosis, early or     treated myeloma or macroglobulinemia, solitary plasmacytoma or     extramedullary plasmacytoma.     Performed at [facility]  [facility]     Status: Abnormal   Collection Time    01/06/14  8:30 AM      Result Value Ref Range   Kappa free light chain 3.41 (*) 0.33 - 1.94 mg/dL   Lamda free light  chains 2.13  0.57 - 2.63 mg/dL   Kappa, lamda light chain ratio 1.60  0.26 - 1.65   Comment: Performed at [place], [facility]     Status: Abnormal   Collection Time    01/06/14 11:58 AM      Result Value Ref Range   Glucose-Capillary 111 (*) 70 - 99 mg/dL  GLUCOSE, CAPILLARY     Status: None   Collection Time    01/06/14  3:46 PM      Result Value Ref Range   Glucose-Capillary 84  70 - 99 mg/dL  TRIGLYCERIDES     Status: Abnormal   Collection Time    01/06/14  6:00 PM      Result Value Ref Range   Triglycerides 176 (*) <150 mg/dL  GLUCOSE, CAPILLARY     Status: None   Collection Time    01/06/14  6:57 PM      Result Value Ref Range   Glucose-Capillary 98  70 - 99 mg/dL  GLUCOSE, CAPILLARY     Status: None   Collection Time    01/06/14 11:58 PM      Result Value Ref Range   Glucose-Capillary 97  70 - 99 mg/dL  BASIC METABOLIC PANEL     Status: Abnormal   Collection Time    01/07/14  4:10 AM      Result Value Ref Range   Sodium 150 (*) 137 - 147 mEq/L   Potassium 4.5  3.7 - 5.3 mEq/L   Chloride 113 (*) 96 - 112 mEq/L   CO2 19  19 - 32 mEq/L  Glucose, Bld 109 (*) 70 - 99 mg/dL   BUN 39 (*) 6 - 23 mg/dL   Creatinine, Ser 7.45 (*) 0.50 - 1.35 mg/dL   Calcium 7.9 (*) 8.4 - 10.5 mg/dL   GFR calc non Af Amer 8 (*) >90 mL/min   GFR calc Af Amer 9 (*) >90 mL/min   Comment: (NOTE)     The eGFR has been calculated using the CKD EPI equation.     This calculation has not been validated in all clinical situations.     eGFR's persistently <90 mL/min signify possible Chronic Kidney     Disease.  CBC     Status: Abnormal   Collection Time    01/07/14  4:10 AM      Result Value Ref Range   WBC 5.8  4.0 - 10.5 K/uL   RBC 2.83 (*) 4.22 - 5.81 MIL/uL   Hemoglobin 8.9 (*) 13.0 - 17.0 g/dL   HCT 25.5 (*) 39.0 - 52.0 %   MCV 90.1  78.0 - 100.0 fL   MCH 31.4  26.0 - 34.0 pg   MCHC 34.9  30.0 - 36.0 g/dL   RDW 13.9  11.5 - 15.5 %   Platelets 134 (*) 150 - 400  K/uL  GLUCOSE, CAPILLARY     Status: Abnormal   Collection Time    01/07/14  4:13 AM      Result Value Ref Range   Glucose-Capillary 105 (*) 70 - 99 mg/dL   Comment 1 Documented in Chart     Comment 2 Notify RN    GLUCOSE, CAPILLARY     Status: Abnormal   Collection Time    01/07/14  8:09 AM      Result Value Ref Range   Glucose-Capillary 105 (*) 70 - 99 mg/dL  GLUCOSE, CAPILLARY     Status: None   Collection Time    01/07/14 11:40 AM      Result Value Ref Range   Glucose-Capillary 79  70 - 99 mg/dL  GLUCOSE, CAPILLARY     Status: None   Collection Time    01/07/14  3:38 PM      Result Value Ref Range   Glucose-Capillary 98  70 - 99 mg/dL  BASIC METABOLIC PANEL     Status: Abnormal   Collection Time    01/07/14  5:00 PM      Result Value Ref Range   Sodium 150 (*) 137 - 147 mEq/L   Potassium 5.0  3.7 - 5.3 mEq/L   Chloride 110  96 - 112 mEq/L   CO2 20  19 - 32 mEq/L   Glucose, Bld 104 (*) 70 - 99 mg/dL   BUN 41 (*) 6 - 23 mg/dL   Creatinine, Ser 7.21 (*) 0.50 - 1.35 mg/dL   Calcium 8.3 (*) 8.4 - 10.5 mg/dL   GFR calc non Af Amer 8 (*) >90 mL/min   GFR calc Af Amer 9 (*) >90 mL/min   Comment: (NOTE)     The eGFR has been calculated using the CKD EPI equation.     This calculation has not been validated in all clinical situations.     eGFR's persistently <90 mL/min signify possible Chronic Kidney     Disease.  GLUCOSE, CAPILLARY     Status: Abnormal   Collection Time    01/07/14  6:58 PM      Result Value Ref Range   Glucose-Capillary 105 (*) 70 - 99 mg/dL  GLUCOSE, CAPILLARY  Status: None   Collection Time    01/07/14 11:54 PM      Result Value Ref Range   Glucose-Capillary 98  70 - 99 mg/dL  GLUCOSE, CAPILLARY     Status: None   Collection Time    01/08/14  3:56 AM      Result Value Ref Range   Glucose-Capillary 89  70 - 99 mg/dL  CBC     Status: Abnormal   Collection Time    01/08/14  4:22 AM      Result Value Ref Range   WBC 10.0  4.0 - 10.5 K/uL    RBC 2.96 (*) 4.22 - 5.81 MIL/uL   Hemoglobin 8.9 (*) 13.0 - 17.0 g/dL   HCT 26.5 (*) 39.0 - 52.0 %   MCV 89.5  78.0 - 100.0 fL   MCH 30.1  26.0 - 34.0 pg   MCHC 33.6  30.0 - 36.0 g/dL   RDW 13.3  11.5 - 15.5 %   Platelets 141 (*) 150 - 400 K/uL  BASIC METABOLIC PANEL     Status: Abnormal   Collection Time    01/08/14  4:22 AM      Result Value Ref Range   Sodium 149 (*) 137 - 147 mEq/L   Potassium 5.0  3.7 - 5.3 mEq/L   Chloride 110  96 - 112 mEq/L   CO2 18 (*) 19 - 32 mEq/L   Glucose, Bld 88  70 - 99 mg/dL   BUN 44 (*) 6 - 23 mg/dL   Creatinine, Ser 7.15 (*) 0.50 - 1.35 mg/dL   Calcium 8.2 (*) 8.4 - 10.5 mg/dL   GFR calc non Af Amer 8 (*) >90 mL/min   GFR calc Af Amer 9 (*) >90 mL/min   Comment: (NOTE)     The eGFR has been calculated using the CKD EPI equation.     This calculation has not been validated in all clinical situations.     eGFR's persistently <90 mL/min signify possible Chronic Kidney     Disease.  GLUCOSE, CAPILLARY     Status: None   Collection Time    01/08/14  7:30 AM      Result Value Ref Range   Glucose-Capillary 93  70 - 99 mg/dL  GLUCOSE, CAPILLARY     Status: None   Collection Time    01/08/14 11:10 AM      Result Value Ref Range   Glucose-Capillary 94  70 - 99 mg/dL  GLUCOSE, CAPILLARY     Status: None   Collection Time    01/08/14  3:10 PM      Result Value Ref Range   Glucose-Capillary 85  70 - 99 mg/dL  BASIC METABOLIC PANEL     Status: Abnormal   Collection Time    01/08/14  6:00 PM      Result Value Ref Range   Sodium 148 (*) 137 - 147 mEq/L   Potassium 4.8  3.7 - 5.3 mEq/L   Chloride 108  96 - 112 mEq/L   CO2 15 (*) 19 - 32 mEq/L   Glucose, Bld 77  70 - 99 mg/dL   BUN 47 (*) 6 - 23 mg/dL   Creatinine, Ser 6.92 (*) 0.50 - 1.35 mg/dL   Calcium 8.2 (*) 8.4 - 10.5 mg/dL   GFR calc non Af Amer 8 (*) >90 mL/min   GFR calc Af Amer 10 (*) >90 mL/min   Comment: (NOTE)     The eGFR  has been calculated using the CKD EPI equation.     This  calculation has not been validated in all clinical situations.     eGFR's persistently <90 mL/min signify possible Chronic Kidney     Disease.  GLUCOSE, CAPILLARY     Status: None   Collection Time    01/08/14  7:24 PM      Result Value Ref Range   Glucose-Capillary 70  70 - 99 mg/dL  GLUCOSE, CAPILLARY     Status: None   Collection Time    01/08/14 11:57 PM      Result Value Ref Range   Glucose-Capillary 89  70 - 99 mg/dL  GLUCOSE, CAPILLARY     Status: None   Collection Time    01/09/14  3:51 AM      Result Value Ref Range   Glucose-Capillary 81  70 - 99 mg/dL  BASIC METABOLIC PANEL     Status: Abnormal   Collection Time    01/09/14  4:00 AM      Result Value Ref Range   Sodium 148 (*) 137 - 147 mEq/L   Potassium 4.8  3.7 - 5.3 mEq/L   Chloride 108  96 - 112 mEq/L   CO2 15 (*) 19 - 32 mEq/L   Glucose, Bld 88  70 - 99 mg/dL   BUN 51 (*) 6 - 23 mg/dL   Creatinine, Ser 6.70 (*) 0.50 - 1.35 mg/dL   Calcium 8.5  8.4 - 10.5 mg/dL   GFR calc non Af Amer 9 (*) >90 mL/min   GFR calc Af Amer 10 (*) >90 mL/min   Comment: (NOTE)     The eGFR has been calculated using the CKD EPI equation.     This calculation has not been validated in all clinical situations.     eGFR's persistently <90 mL/min signify possible Chronic Kidney     Disease.  CBC     Status: Abnormal   Collection Time    01/09/14  4:00 AM      Result Value Ref Range   WBC 9.8  4.0 - 10.5 K/uL   RBC 3.11 (*) 4.22 - 5.81 MIL/uL   Hemoglobin 9.4 (*) 13.0 - 17.0 g/dL   HCT 27.9 (*) 39.0 - 52.0 %   MCV 89.7  78.0 - 100.0 fL   MCH 30.2  26.0 - 34.0 pg   MCHC 33.7  30.0 - 36.0 g/dL   RDW 13.4  11.5 - 15.5 %   Platelets 165  150 - 400 K/uL  GLUCOSE, CAPILLARY     Status: None   Collection Time    01/09/14  7:09 AM      Result Value Ref Range   Glucose-Capillary 86  70 - 99 mg/dL  GLUCOSE, CAPILLARY     Status: None   Collection Time    01/09/14 11:16 AM      Result Value Ref Range   Glucose-Capillary 82  70 - 99  mg/dL  POCT I-STAT 3, BLOOD GAS (G3+)     Status: Abnormal   Collection Time    01/09/14  2:34 PM      Result Value Ref Range   pH, Arterial 7.408  7.350 - 7.450   pCO2 arterial 25.7 (*) 35.0 - 45.0 mmHg   pO2, Arterial 71.0 (*) 80.0 - 100.0 mmHg   Bicarbonate 16.2 (*) 20.0 - 24.0 mEq/L   TCO2 17  0 - 100 mmol/L   O2 Saturation 94.0  Acid-base deficit 7.0 (*) 0.0 - 2.0 mmol/L   Patient temperature 99.1 F     Collection site RADIAL, ALLEN'S TEST ACCEPTABLE     Drawn by Operator     Sample type ARTERIAL    GLUCOSE, CAPILLARY     Status: None   Collection Time    01/09/14  3:04 PM      Result Value Ref Range   Glucose-Capillary 94  70 - 99 mg/dL  LACTIC ACID, PLASMA     Status: None   Collection Time    01/09/14  3:36 PM      Result Value Ref Range   Lactic Acid, Venous 0.6  0.5 - 2.2 mmol/L  CK     Status: Abnormal   Collection Time    01/09/14  4:00 PM      Result Value Ref Range   Total CK 660 (*) 7 - 232 U/L  KETONES, QUALITATIVE     Status: Abnormal   Collection Time    01/09/14  4:00 PM      Result Value Ref Range   Acetone, Bld MODERATE (*) NEGATIVE  GLUCOSE, CAPILLARY     Status: None   Collection Time    01/09/14  7:30 PM      Result Value Ref Range   Glucose-Capillary 96  70 - 99 mg/dL  GLUCOSE, CAPILLARY     Status: Abnormal   Collection Time    01/09/14 11:50 PM      Result Value Ref Range   Glucose-Capillary 109 (*) 70 - 99 mg/dL  BASIC METABOLIC PANEL     Status: Abnormal   Collection Time    01/10/14  3:28 AM      Result Value Ref Range   Sodium 150 (*) 137 - 147 mEq/L   Potassium 4.2  3.7 - 5.3 mEq/L   Chloride 111  96 - 112 mEq/L   CO2 19  19 - 32 mEq/L   Glucose, Bld 123 (*) 70 - 99 mg/dL   BUN 49 (*) 6 - 23 mg/dL   Creatinine, Ser 6.00 (*) 0.50 - 1.35 mg/dL   Calcium 8.4  8.4 - 10.5 mg/dL   GFR calc non Af Amer 10 (*) >90 mL/min   GFR calc Af Amer 12 (*) >90 mL/min   Comment: (NOTE)     The eGFR has been calculated using the CKD EPI  equation.     This calculation has not been validated in all clinical situations.     eGFR's persistently <90 mL/min signify possible Chronic Kidney     Disease.  TROPONIN I     Status: None   Collection Time    01/10/14  3:28 AM      Result Value Ref Range   Troponin I <0.30  <0.30 ng/mL   Comment:            Due to the release kinetics of cTnI,     a negative result within the first hours     of the onset of symptoms does not rule out     myocardial infarction with certainty.     If myocardial infarction is still suspected,     repeat the test at appropriate intervals.  MAGNESIUM     Status: None   Collection Time    01/10/14  3:29 AM      Result Value Ref Range   Magnesium 2.1  1.5 - 2.5 mg/dL  GLUCOSE, CAPILLARY     Status: Abnormal  Collection Time    01/10/14  3:47 AM      Result Value Ref Range   Glucose-Capillary 122 (*) 70 - 99 mg/dL   Comment 1 Notify RN    GLUCOSE, CAPILLARY     Status: Abnormal   Collection Time    01/10/14  8:21 AM      Result Value Ref Range   Glucose-Capillary 140 (*) 70 - 99 mg/dL  GLUCOSE, CAPILLARY     Status: Abnormal   Collection Time    01/10/14 12:18 PM      Result Value Ref Range   Glucose-Capillary 116 (*) 70 - 99 mg/dL  POCT I-STAT 3, BLOOD GAS (G3+)     Status: Abnormal   Collection Time    01/10/14  1:17 PM      Result Value Ref Range   pH, Arterial 7.418  7.350 - 7.450   pCO2 arterial 44.2  35.0 - 45.0 mmHg   pO2, Arterial 368.0 (*) 80.0 - 100.0 mmHg   Bicarbonate 28.5 (*) 20.0 - 24.0 mEq/L   TCO2 30  0 - 100 mmol/L   O2 Saturation 100.0     Acid-Base Excess 4.0 (*) 0.0 - 2.0 mmol/L   Patient temperature 98.4 F     Collection site RADIAL, ALLEN'S TEST ACCEPTABLE     Drawn by Operator     Sample type ARTERIAL    TROPONIN I     Status: None   Collection Time    01/10/14  3:02 PM      Result Value Ref Range   Troponin I <0.30  <0.30 ng/mL                                                                                                                                    pH, Arterial 7.513 (*) 7.350 - 7.450   pCO2 arterial 33.6 (*) 35.0 - 45.0 mmHg   pO2, Arterial 130.0 (*) 80.0 - 100.0 mmHg   Bicarbonate 27.0 (*) 20.0 - 24.0 mEq/L   TCO2 28  0 - 100 mmol/L   O2 Saturation 99.0     Acid-Base Excess 4.0 (*) 0.0 - 2.0 mmol/L   Patient temperature 98.8 F     Collection site RADIAL, ALLEN'S TEST ACCEPTABLE     Drawn by RT     Sample type ARTERIAL    BASIC METABOLIC PANEL     Status: Abnormal   Collection Time    01/11/14  5:00 AM      Result Value Ref Range   Sodium 149 (*) 137 - 147 mEq/L   Potassium 3.9  3.7 - 5.3 mEq/L   Chloride 108  96 - 112 mEq/L   CO2 24  19 - 32 mEq/L   Glucose, Bld 119 (*) 70 - 99 mg/dL   BUN 42 (*) 6 - 23 mg/dL   Creatinine, Ser 5.00 (*) 0.50 - 1.35 mg/dL   Calcium 8.0 (*)  8.4 - 10.5 mg/dL   GFR calc non Af Amer 13 (*) >90 mL/min   GFR calc Af Amer 15 (*) >90 mL/min   Comment:                       GLUCOSE, CAPILLARY     Status: Abnormal   Collection Time    01/11/14  8:21 AM      Result Value Ref Range   Glucose-Capillary 122 (*) 70 - 99 mg/dL  GLUCOSE, CAPILLARY     Status: None   Collection Time    01/11/14 11:50 AM      Result Value Ref Range   Glucose-Capillary 98  70 - 99 mg/dL  GLUCOSE, CAPILLARY     Status: Abnormal   Collection Time    01/11/14  3:24 PM      Result Value Ref Range   Glucose-Capillary 108 (*) 70 - 99 mg/dL  GLUCOSE, CAPILLARY     Status: None   Collection Time    01/11/14  6:53 PM      Result Value Ref Range   Glucose-Capillary 94  70 - 99 mg/dL  GLUCOSE, CAPILLARY     Status: Abnormal   Collection Time    01/12/14 12:10 AM      Result Value Ref Range   Glucose-Capillary 105 (*) 70 - 99 mg/dL   Comment 1 Notify RN    BASIC METABOLIC PANEL     Status: Abnormal   Collection Time    01/12/14  4:06 AM      Result Value Ref Range   Sodium 144  137 - 147 mEq/L   Potassium 3.9  3.7 - 5.3 mEq/L    Chloride 105  96 - 112 mEq/L   CO2 21  19 - 32 mEq/L   Glucose, Bld 88  70 - 99 mg/dL   BUN 34 (*) 6 - 23 mg/dL   Creatinine, Ser 4.15 (*) 0.50 - 1.35 mg/dL   Calcium 7.7 (*) 8.4 - 10.5 mg/dL   GFR calc non Af Amer 16 (*) >90 mL/min   GFR calc Af Amer 18 (*) >90 mL/min   Comment: (NOTE)     The eGFR has been calculated using the CKD EPI equation.     This calculation has not been validated in all clinical situations.     eGFR's persistently <90 mL/min signify possible Chronic Kidney     Disease.  CBC WITH DIFFERENTIAL     Status: Abnormal   Collection Time    01/12/14  4:06 AM      Result Value Ref Range   WBC 5.6  4.0 - 10.5 K/uL   RBC 2.64 (*) 4.22 - 5.81 MIL/uL   Hemoglobin 8.1 (*) 13.0 - 17.0 g/dL   HCT 24.0 (*) 39.0 - 52.0 %   MCV 90.9  78.0 - 100.0 fL   MCH 30.7  26.0 - 34.0 pg   MCHC 33.8  30.0 - 36.0 g/dL   RDW 12.5  11.5 - 15.5 %   Platelets 125 (*) 150 - 400 K/uL   Neutrophils Relative % 65  43 - 77 %   Neutro Abs 3.6  1.7 - 7.7 K/uL   Lymphocytes Relative 18  12 - 46 %   Lymphs Abs 1.0  0.7 - 4.0 K/uL   Monocytes Relative 13 (*) 3 - 12 %   Monocytes Absolute 0.7  0.1 - 1.0 K/uL   Eosinophils Relative 5  0 -  5 %   Eosinophils Absolute 0.3  0.0 - 0.7 K/uL   Basophils Relative 0  0 - 1 %   Basophils Absolute 0.0  0.0 - 0.1 K/uL  MAGNESIUM     Status: None   Collection Time    01/12/14  4:06 AM      Result Value Ref Range   Magnesium 1.6  1.5 - 2.5 mg/dL  PHOSPHORUS     Status: Abnormal   Collection Time    01/12/14  4:06 AM      Result Value Ref Range   Phosphorus 4.8 (*) 2.3 - 4.6 mg/dL  ALBUMIN     Status: Abnormal   Collection Time    01/12/14  4:06 AM      Result Value Ref Range   Albumin 2.3 (*) 3.5 - 5.2 g/dL  GLUCOSE, CAPILLARY     Status: None   Collection Time    01/12/14  4:17 AM      Result Value Ref Range   Glucose-Capillary 84  70 - 99 mg/dL  POCT I-STAT 3, BLOOD GAS (G3+)     Status: Abnormal   Collection Time    01/12/14  5:00 AM       Result Value Ref Range   pH, Arterial 7.365  7.350 - 7.450   pCO2 arterial 40.0  35.0 - 45.0 mmHg   pO2, Arterial 138.0 (*) 80.0 - 100.0 mmHg   Bicarbonate 22.8  20.0 - 24.0 mEq/L   TCO2 24  0 - 100 mmol/L   O2 Saturation 99.0     Acid-base deficit 2.0  0.0 - 2.0 mmol/L   Patient temperature 99.0 F     Collection site RADIAL, ALLEN'S TEST ACCEPTABLE     Drawn by RT     Sample type ARTERIAL    GLUCOSE, CAPILLARY     Status: None   Collection Time    01/12/14  8:15 AM      Result Value Ref Range   Glucose-Capillary 90  70 - 99 mg/dL  CBC     Status: Abnormal   Collection Time    01/12/14 11:00 AM      Result Value Ref Range   WBC 5.1  4.0 - 10.5 K/uL   RBC 2.65 (*) 4.22 - 5.81 MIL/uL   Hemoglobin 8.4 (*) 13.0 - 17.0 g/dL   HCT 23.9 (*) 39.0 - 52.0 %   MCV 90.2  78.0 - 100.0 fL   MCH 31.7  26.0 - 34.0 pg   MCHC 35.1  30.0 - 36.0 g/dL   RDW 12.2  11.5 - 15.5 %   Platelets 112 (*) 150 - 400 K/uL   Comment: PLATELET COUNT CONFIRMED BY SMEAR  GLUCOSE, CAPILLARY     Status: None   Collection Time    01/12/14 12:37 PM      Result Value Ref Range   Glucose-Capillary 85  70 - 99 mg/dL  GLUCOSE, CAPILLARY     Status: None   Collection Time    01/12/14  3:29 PM      Result Value Ref Range   Glucose-Capillary 82  70 - 99 mg/dL  GLUCOSE, CAPILLARY     Status: None   Collection Time    01/12/14  7:16 PM      Result Value Ref Range   Glucose-Capillary 72  70 - 99 mg/dL  GLUCOSE, CAPILLARY     Status: None   Collection Time    01/13/14 12:02 AM  Result Value Ref Range   Glucose-Capillary 80  70 - 99 mg/dL  BLOOD GAS, ARTERIAL     Status: Abnormal   Collection Time    01/13/14  3:44 AM      Result Value Ref Range   FIO2 0.28     Delivery systems NASAL CANNULA     pH, Arterial 7.320 (*) 7.350 - 7.450   pCO2 arterial 39.1  35.0 - 45.0 mmHg   pO2, Arterial 116.0 (*) 80.0 - 100.0 mmHg   Bicarbonate 19.6 (*) 20.0 - 24.0 mEq/L   TCO2 20.8  0 - 100 mmol/L   Acid-base deficit  5.5 (*) 0.0 - 2.0 mmol/L   O2 Saturation 98.3     Patient temperature 98.6     Collection site RIGHT RADIAL     Drawn by COLLECTED BY RT     Sample type ARTERIAL DRAW     Allens test (pass/fail) PASS  PASS  GLUCOSE, CAPILLARY     Status: None   Collection Time    01/13/14  4:09 AM      Result Value Ref Range   Glucose-Capillary 76  70 - 99 mg/dL  COMPREHENSIVE METABOLIC PANEL     Status: Abnormal   Collection Time    01/13/14  5:00 AM      Result Value Ref Range   Sodium 145  137 - 147 mEq/L   Potassium 3.9  3.7 - 5.3 mEq/L   Chloride 107  96 - 112 mEq/L   CO2 18 (*) 19 - 32 mEq/L   Glucose, Bld 75  70 - 99 mg/dL   BUN 30 (*) 6 - 23 mg/dL   Creatinine, Ser 3.33 (*) 0.50 - 1.35 mg/dL   Calcium 8.0 (*) 8.4 - 10.5 mg/dL   Total Protein 5.7 (*) 6.0 - 8.3 g/dL   Albumin 2.5 (*) 3.5 - 5.2 g/dL   AST 10  0 - 37 U/L   ALT 13  0 - 53 U/L   Alkaline Phosphatase 90  39 - 117 U/L   Total Bilirubin 0.3  0.3 - 1.2 mg/dL   GFR calc non Af Amer 21 (*) >90 mL/min   GFR calc Af Amer 24 (*) >90 mL/min   Comment: (NOTE)     The eGFR has been calculated using the CKD EPI equation.     This calculation has not been validated in all clinical situations.     eGFR's persistently <90 mL/min signify possible Chronic Kidney     Disease.  CBC     Status: Abnormal   Collection Time    01/13/14  5:00 AM      Result Value Ref Range   WBC 4.6  4.0 - 10.5 K/uL   RBC 2.67 (*) 4.22 - 5.81 MIL/uL   Hemoglobin 8.3 (*) 13.0 - 17.0 g/dL   HCT 23.9 (*) 39.0 - 52.0 %   MCV 89.5  78.0 - 100.0 fL   MCH 31.1  26.0 - 34.0 pg   MCHC 34.7  30.0 - 36.0 g/dL   RDW 12.0  11.5 - 15.5 %   Platelets 129 (*) 150 - 400 K/uL  MAGNESIUM     Status: None   Collection Time    01/13/14  5:00 AM      Result Value Ref Range   Magnesium 1.8  1.5 - 2.5 mg/dL  PHOSPHORUS     Status: None   Collection Time    01/13/14  5:00 AM  Result Value Ref Range   Phosphorus 4.5  2.3 - 4.6 mg/dL  GLUCOSE, CAPILLARY     Status:  None   Collection Time    01/13/14  8:14 AM      Result Value Ref Range   Glucose-Capillary 77  70 - 99 mg/dL  GLUCOSE, CAPILLARY     Status: None   Collection Time    01/13/14 11:46 AM      Result Value Ref Range   Glucose-Capillary 85  70 - 99 mg/dL  BASIC METABOLIC PANEL     Status: Abnormal   Collection Time    01/14/14  5:53 AM      Result Value Ref Range   Sodium 147  137 - 147 mEq/L   Potassium 3.7  3.7 - 5.3 mEq/L   Chloride 110  96 - 112 mEq/L   CO2 24  19 - 32 mEq/L   Glucose, Bld 99  70 - 99 mg/dL   BUN 30 (*) 6 - 23 mg/dL   Creatinine, Ser 2.92 (*) 0.50 - 1.35 mg/dL   Calcium 8.4  8.4 - 10.5 mg/dL   GFR calc non Af Amer 24 (*) >90 mL/min   GFR calc Af Amer 28 (*) >90 mL/min   Comment:                       CK     Status: None   Collection Time    01/14/14  5:53 AM      Result Value Ref Range   Total CK 114  7 - 232 U/L  CBC     Status: Abnormal   Collection Time    01/14/14  5:53 AM      Result Value Ref Range   WBC 4.4  4.0 - 10.5 K/uL   RBC 2.77 (*) 4.22 - 5.81 MIL/uL   Hemoglobin 8.6 (*) 13.0 - 17.0 g/dL   HCT 24.6 (*) 39.0 - 52.0 %   MCV 88.8  78.0 - 100.0 fL   MCH 31.0  26.0 - 34.0 pg   MCHC 35.0  30.0 - 36.0 g/dL   RDW 12.1  11.5 - 15.5 %   Platelets 168  150 - 400 K/uL   Comment: DELTA CHECK NOTED     REPEATED TO VERIFY  GLUCOSE, CAPILLARY     Status: Abnormal   Collection Time    01/14/14  9:14 PM      Result Value Ref Range   Glucose-Capillary 127 (*) 70 - 99 mg/dL  BASIC METABOLIC PANEL     Status: Abnormal   Collection Time    01/15/14  3:20 AM      Result Value Ref Range   Sodium 146  137 - 147 mEq/L   Potassium 4.2  3.7 - 5.3 mEq/L   Chloride 108  96 - 112 mEq/L   CO2 23  19 - 32 mEq/L   Glucose, Bld 106 (*) 70 - 99 mg/dL   BUN 28 (*) 6 - 23 mg/dL   Creatinine, Ser 2.62 (*) 0.50 - 1.35 mg/dL   Calcium 8.4  8.4 - 10.5 mg/dL   GFR calc non Af Amer 27 (*) >90 mL/min   GFR calc Af Amer 32 (*) >90 mL/min   Comment:                        CBC     Status: Abnormal   Collection Time  01/15/14  3:20 AM      Result Value Ref Range   WBC 4.4  4.0 - 10.5 K/uL   RBC 2.82 (*) 4.22 - 5.81 MIL/uL   Hemoglobin 8.6 (*) 13.0 - 17.0 g/dL   HCT 25.3 (*) 39.0 - 52.0 %   MCV 89.7  78.0 - 100.0 fL   MCH 30.5  26.0 - 34.0 pg   MCHC 34.0  30.0 - 36.0 g/dL   RDW 12.2  11.5 - 15.5 %   Platelets 205  150 - 400 K/uL  BASIC METABOLIC PANEL     Status: Abnormal   Collection Time    01/16/14  7:00 AM      Result Value Ref Range   Sodium 144  137 - 147 mEq/L   Potassium 4.3  3.7 - 5.3 mEq/L   Chloride 106  96 - 112 mEq/L   CO2 22  19 - 32 mEq/L   Glucose, Bld 106 (*) 70 - 99 mg/dL   BUN 27 (*) 6 - 23 mg/dL   Creatinine, Ser 2.75 (*) 0.50 - 1.35 mg/dL   Calcium 8.8  8.4 - 10.5 mg/dL   GFR calc non Af Amer 26 (*) >90 mL/min   GFR calc Af Amer 30 (*) >90 mL/min   Comment: (NOTE)                      GLUCOSE, CAPILLARY     Status: Abnormal   Collection Time    01/16/14  8:23 PM      Result Value Ref Range   Glucose-Capillary 105 (*) 70 - 99 mg/dL  BASIC METABOLIC PANEL     Status: Abnormal   Collection Time    01/17/14  6:49 AM      Result Value Ref Range   Sodium 138  137 - 147 mEq/L   Potassium 4.4  3.7 - 5.3 mEq/L   Chloride 102  96 - 112 mEq/L   CO2 23  19 - 32 mEq/L   Glucose, Bld 116 (*) 70 - 99 mg/dL   BUN 29 (*) 6 - 23 mg/dL   Creatinine, Ser 2.62 (*) 0.50 - 1.35 mg/dL   Calcium 8.7  8.4 - 10.5 mg/dL   GFR calc non Af Amer 27 (*) >90 mL/min   GFR calc Af Amer 32 (*) >90 mL/min   Comment:                       CBC     Status: Abnormal   Collection Time    01/17/14  7:02 PM      Result Value Ref Range   WBC 5.7  4.0 - 10.5 K/uL   RBC 3.29 (*) 4.22 - 5.81 MIL/uL   Hemoglobin 10.1 (*) 13.0 - 17.0 g/dL   HCT 29.8 (*) 39.0 - 52.0 %   MCV 90.6  78.0 - 100.0 fL   MCH 30.7  26.0 - 34.0 pg   MCHC 33.9  30.0 - 36.0 g/dL   RDW 12.0  11.5 - 15.5 %   Platelets 235  150 - 400 K/uL  CREATININE, SERUM      Status: Abnormal   Collection Time    01/17/14  7:02 PM      Result Value Ref Range   Creatinine, Ser 2.57 (*) 0.50 - 1.35 mg/dL   GFR calc non Af Amer 28 (*) >90 mL/min   GFR calc Af Amer 33 (*) >90 mL/min  Comment: (NOTE)     The eGFR has been calculated using the CKD EPI equation.     This calculation has not been validated in all clinical situations.     eGFR's persistently <90 mL/min signify possible Chronic Kidney     Disease.  CBC WITH DIFFERENTIAL     Status: Abnormal   Collection Time    01/18/14  5:45 AM      Result Value Ref Range   WBC 5.3  4.0 - 10.5 K/uL   RBC 2.85 (*) 4.22 - 5.81 MIL/uL   Hemoglobin 8.8 (*) 13.0 - 17.0 g/dL   HCT 25.6 (*) 39.0 - 52.0 %   MCV 89.8  78.0 - 100.0 fL   MCH 30.9  26.0 - 34.0 pg   MCHC 34.4  30.0 - 36.0 g/dL   RDW 12.0  11.5 - 15.5 %   Platelets 192  150 - 400 K/uL   Neutrophils Relative % 56  43 - 77 %   Neutro Abs 3.0  1.7 - 7.7 K/uL   Lymphocytes Relative 27  12 - 46 %   Lymphs Abs 1.4  0.7 - 4.0 K/uL   Monocytes Relative 8  3 - 12 %   Monocytes Absolute 0.4  0.1 - 1.0 K/uL   Eosinophils Relative 8 (*) 0 - 5 %   Eosinophils Absolute 0.4  0.0 - 0.7 K/uL   Basophils Relative 1  0 - 1 %   Basophils Absolute 0.1  0.0 - 0.1 K/uL  COMPREHENSIVE METABOLIC PANEL     Status: Abnormal   Collection Time    01/18/14  5:45 AM      Result Value Ref Range   Sodium 142  137 - 147 mEq/L   Potassium 4.5  3.7 - 5.3 mEq/L   Chloride 104  96 - 112 mEq/L   CO2 23  19 - 32 mEq/L   Glucose, Bld 108 (*) 70 - 99 mg/dL   BUN 29 (*) 6 - 23 mg/dL   Creatinine, Ser 2.46 (*) 0.50 - 1.35 mg/dL   Calcium 8.4  8.4 - 10.5 mg/dL   Total Protein 6.3  6.0 - 8.3 g/dL   Albumin 2.9 (*) 3.5 - 5.2 g/dL   AST 10  0 - 37 U/L   ALT 11  0 - 53 U/L   Alkaline Phosphatase 104  39 - 117 U/L   Total Bilirubin <0.2 (*) 0.3 - 1.2 mg/dL   GFR calc non Af Amer 30 (*) >90 mL/min   GFR calc Af Amer 34 (*) >90 mL/min   Comment: (NOTE)     The eGFR has been calculated  using the CKD EPI equation.     This calculation has not been validated in all clinical situations.     eGFR's persistently <90 mL/min signify possible Chronic Kidney     Disease.  COMPREHENSIVE METABOLIC PANEL     Status: Abnormal   Collection Time    02/25/14 11:09 AM      Result Value Ref Range   Sodium 138  135 - 145 mEq/L   Potassium 4.3  3.5 - 5.3 mEq/L   Chloride 103  96 - 112 mEq/L   CO2 25  19 - 32 mEq/L   Glucose, Bld 101 (*) 70 - 99 mg/dL   BUN 17  6 - 23 mg/dL   Creat 1.36 (*) 0.50 - 1.35 mg/dL   Total Bilirubin 0.4  0.2 - 1.2 mg/dL   Alkaline Phosphatase 93  39 - 117 U/L   AST 12  0 - 37 U/L   ALT 8  0 - 53 U/L   Total Protein 6.7  6.0 - 8.3 g/dL   Albumin 4.1  3.5 - 5.2 g/dL   Calcium 8.5  8.4 - 10.5 mg/dL  CBC WITH DIFFERENTIAL     Status: Abnormal   Collection Time    02/25/14 11:09 AM      Result Value Ref Range   WBC 6.2  4.0 - 10.5 K/uL   RBC 3.77 (*) 4.22 - 5.81 MIL/uL   Hemoglobin 11.5 (*) 13.0 - 17.0 g/dL   HCT 33.4 (*) 39.0 - 52.0 %   MCV 88.6  78.0 - 100.0 fL   MCH 30.5  26.0 - 34.0 pg   MCHC 34.4  30.0 - 36.0 g/dL   RDW 15.1  11.5 - 15.5 %   Platelets 194  150 - 400 K/uL   Neutrophils Relative % 62  43 - 77 %   Neutro Abs 3.8  1.7 - 7.7 K/uL   Lymphocytes Relative 28  12 - 46 %   Lymphs Abs 1.7  0.7 - 4.0 K/uL   Monocytes Relative 7  3 - 12 %   Monocytes Absolute 0.4  0.1 - 1.0 K/uL   Eosinophils Relative 2  0 - 5 %   Eosinophils Absolute 0.1  0.0 - 0.7 K/uL   Basophils Relative 1  0 - 1 %   Basophils Absolute 0.1  0.0 - 0.1 K/uL   Smear Review Criteria for review not met    CLOZAPINE (CLOZARIL)     Status: None   Collection Time    02/25/14 11:09 AM      Result Value Ref Range   Clozapine Lvl 387     Comment:                             NorClozapine 222  25 - 400 mcg/L  HEMOGLOBIN A1C     Status: None   Collection Time    02/25/14 11:19 AM      Result Value Ref Range   Hemoglobin A1C 5.6  <5.7 %   Comment:                                                                                                            5.7-6.4%   Increased risk of developing Diabetes Mellitus                              Mean Plasma Glucose 114  <117 mg/dL    Physical Exam: Constitutional:  BP 137/81  Pulse 100  Ht '6\' 3"'  (1.905 m)  Wt 206 lb 9.6 oz (93.713 kg)  BMI 25.82 kg/m2  Musculoskeletal: Strength & Muscle Tone: within normal limits Gait & Station: normal Patient leans: N/A  Mental Status Examination;  Patient is casually dressed and fairly groomed.  He appears anxious but cooperative.  His psychomotor activity is slightly increased.  His speech is slow and at times rambling.  His thought processes is circumstantial.  However he is relevant in conversation.  He described his mood anxious and his affect is mood appropriate.  He denies any suicidal thoughts or homicidal thoughts.  He denies any hallucination and paranoia.  There were no delusions or obsessions at this time.  He has no tremors or shakes but he endorse sometimes jerky movements.  His fund of knowledge is average.  He had some difficulty remembering things but overall he is able to answer the question.  He is alert and oriented x3.  His attention and concentration is fair.  His insight judgment and impulse control is okay.   Established Problem, Stable/Improving (1), Review of Psycho-Social Stressors (1), Review or order clinical lab tests (1), Review of Last Therapy Session (1), Review of Medication Regimen & Side Effects (2) and Review of New Medication or Change in Dosage (2)  Assessment: Axis I: Schizoaffective disorder with psychotic features  Axis II: Deferred  Axis III:  Past Medical History  Diagnosis Date  . Hyperlipidemia   . Schizoaffective disorder   . H/O: GI bleed   . Seizures   . Depression   . Chronic kidney disease   . Anemia 01/14/2014  . Anxiety attack     Axis IV: Mild to moderate   Plan:  I reviewed records from his  neurologist , blood work as well as in his current medication.  I will add Vistaril 25 mg for anxiety which he can take for severe panic attack.  I will continue clozapine, venlafaxine and Cogentin at present dose.  Recommended to see therapist.  I will see him again in 2 months. Time spent 25 mn,  More than 50% of the time spent in psychoeducation, counseling and coordination of care.  Discuss safety plan that anytime having active suicidal thoughts or homicidal thoughts then patient need to call 911 or go to the local emergency room.    ARFEEN,SYED T., MD 03/16/2014

## 2014-03-18 ENCOUNTER — Encounter: Payer: Medicare Other | Attending: Physical Medicine & Rehabilitation | Admitting: Physical Medicine & Rehabilitation

## 2014-03-18 ENCOUNTER — Encounter: Payer: Self-pay | Admitting: Physical Medicine & Rehabilitation

## 2014-03-18 VITALS — BP 119/79 | HR 112 | Resp 14 | Ht 75.0 in | Wt 203.0 lb

## 2014-03-18 DIAGNOSIS — N189 Chronic kidney disease, unspecified: Secondary | ICD-10-CM | POA: Insufficient documentation

## 2014-03-18 DIAGNOSIS — G40401 Other generalized epilepsy and epileptic syndromes, not intractable, with status epilepticus: Secondary | ICD-10-CM

## 2014-03-18 DIAGNOSIS — G40901 Epilepsy, unspecified, not intractable, with status epilepticus: Secondary | ICD-10-CM

## 2014-03-18 DIAGNOSIS — G40909 Epilepsy, unspecified, not intractable, without status epilepticus: Secondary | ICD-10-CM | POA: Insufficient documentation

## 2014-03-18 DIAGNOSIS — F209 Schizophrenia, unspecified: Secondary | ICD-10-CM | POA: Insufficient documentation

## 2014-03-18 DIAGNOSIS — G9340 Encephalopathy, unspecified: Secondary | ICD-10-CM

## 2014-03-18 DIAGNOSIS — S069XAS Unspecified intracranial injury with loss of consciousness status unknown, sequela: Secondary | ICD-10-CM | POA: Insufficient documentation

## 2014-03-18 DIAGNOSIS — S069X9S Unspecified intracranial injury with loss of consciousness of unspecified duration, sequela: Secondary | ICD-10-CM | POA: Insufficient documentation

## 2014-03-18 DIAGNOSIS — M545 Low back pain, unspecified: Secondary | ICD-10-CM | POA: Insufficient documentation

## 2014-03-18 DIAGNOSIS — E785 Hyperlipidemia, unspecified: Secondary | ICD-10-CM | POA: Insufficient documentation

## 2014-03-18 DIAGNOSIS — X58XXXS Exposure to other specified factors, sequela: Secondary | ICD-10-CM | POA: Insufficient documentation

## 2014-03-18 NOTE — Progress Notes (Signed)
Subjective:    Patient ID: Brady Hoffman, male    DOB: Apr 02, 1966, 48 y.o.   MRN: [id number]  HPI  Major is back regarding his fall, seizure, and brain injury.  He has been doing well. Cognitively he's back to baseline. His bowels and bladder have been functioning normally.   His biggest complaint has been his low back.  It bothers him most when he stands up or walks. His family MD ordered him some tylenol which he occasionally takes. He doesn't use any ice or heat. He doesn't stretch. Overall it sounds as if it's improving.   Pain Inventory Average Pain 8 Pain Right Now 0 My pain is sharp, stabbing and aching  In the last 24 hours, has pain interfered with the following? General activity 3 Relation with others 4 Enjoyment of life 8 What TIME of day is your pain at its worst? evening Sleep (in general) Fair  Pain is worse with: bending Pain improves with: medication Relief from Meds: 7  Mobility walk without assistance how many minutes can you walk? 5 ability to climb steps?  yes do you drive?  no  Function disabled: date disabled .  Neuro/Psych depression anxiety  Prior Studies x-rays  Physicians involved in your care Primary care . Neurologist . Psychiatrist .   Family History  Problem Relation Age of Onset  . Adopted: Yes   History   Social History  . Marital Status: Single    Spouse Name: N/A    Number of Children: 0  . Years of Education: College   Occupational History  .  Other   Social History Main Topics  . Smoking status: Never Smoker   . Smokeless tobacco: Never Used  . Alcohol Use: No  . Drug Use: No  . Sexual Activity: No   Other Topics Concern  . None   Social History Narrative   Patient lives in [place].   Caffeine Use: 2 cups daily   Past Surgical History  Procedure Laterality Date  . Mole removal    . Wisdom tooth extraction    . Esophagogastroduodenoscopy  11/01/2011    Procedure:  ESOPHAGOGASTRODUODENOSCOPY (EGD);  Surgeon: Beryle Beams;  Location: [facility];  Service: Endoscopy;  Laterality: N/A;   Past Medical History  Diagnosis Date  . Hyperlipidemia   . Schizoaffective disorder   . H/O: GI bleed   . Seizures   . Depression   . Chronic kidney disease   . Anemia 01/14/2014  . Anxiety attack    BP 119/79  Pulse 112  Resp 14  Ht 6\' 3"  (1.905 m)  Wt 203 lb (92.08 kg)  BMI 25.37 kg/m2  SpO2 99%  Opioid Risk Score:   Fall Risk Score: High Fall Risk (>13 points) (patient educated handout declined)   Review of Systems  Constitutional: Positive for unexpected weight change.  Psychiatric/Behavioral: Positive for dysphoric mood. The patient is nervous/anxious.   All other systems reviewed and are negative.      Objective:   Physical Exam  Head: Normocephalic.  Eyes: EOM are normal.  No nystagmus  Neck: Normal range of motion. Neck supple. No thyromegaly present.  Cardiovascular: Normal rate and regular rhythm. No murmurs, rubs  Respiratory: Effort normal and breath sounds normal. No respiratory distress. No wheezes  GI: Soft. Bowel sounds are normal. He exhibits no distension.  Neurological: He is alert.  good attention and awareness. Oriented to hospital, date. Follows all simple commands. Strength 5/5 UE's. LE's 5/5  HF, 4+/5 knee and ankles. Walks with a bit of a steppage gait. Balance is good. No gross sensory deficits, DTR's 1+, no tremors, resting tone. CN exam non-focal.  Musc: low back notable for taut paraspinals right more than left---limited flexion of 75 degrees. No real pain with rom in any direction. Provocative manuevers negative. Skin: Skin is warm and dry  Psych: pleasant and cooperative   Assessment/Plan:  1. Functional deficits secondary to metabolic toxic/encephalopathy/mild TBI?  -pt appears to be back at cognitive baseline.  -balance is excellent 2. Low back pain---appears generally muscular.   -reviewed posture and  exercises which were provided to patient  -utilize heat and ice as well. Tylenol is fine  -if any further problems, we can pursue xrays, but it appears that it is improving 3 . Mood/schizoaffective disorder. Cogentin 2 mg each bedtime,Clozaril 100 mg twice a day and 400 mg each bedtime, Effexor 75 mg twice a day.  4. Seizure disorder. Keppra 500 mg twice a day per neurology  Follow up with me PRN

## 2014-03-18 NOTE — Patient Instructions (Signed)

## 2014-03-23 ENCOUNTER — Ambulatory Visit: Payer: Medicare Other | Admitting: Neurology

## 2014-03-23 ENCOUNTER — Other Ambulatory Visit (HOSPITAL_COMMUNITY): Payer: Self-pay | Admitting: Psychiatry

## 2014-03-23 LAB — CBC WITH DIFFERENTIAL/PLATELET
Basophils Absolute: 0.1 10*3/uL (ref 0.0–0.1)
Basophils Relative: 1 % (ref 0–1)
EOS PCT: 4 % (ref 0–5)
Eosinophils Absolute: 0.2 10*3/uL (ref 0.0–0.7)
HCT: 35.2 % — ABNORMAL LOW (ref 39.0–52.0)
HEMOGLOBIN: 12.3 g/dL — AB (ref 13.0–17.0)
Lymphocytes Relative: 31 % (ref 12–46)
Lymphs Abs: 1.6 10*3/uL (ref 0.7–4.0)
MCH: 30.3 pg (ref 26.0–34.0)
MCHC: 34.9 g/dL (ref 30.0–36.0)
MCV: 86.7 fL (ref 78.0–100.0)
MONO ABS: 0.5 10*3/uL (ref 0.1–1.0)
MONOS PCT: 9 % (ref 3–12)
Neutro Abs: 2.9 10*3/uL (ref 1.7–7.7)
Neutrophils Relative %: 55 % (ref 43–77)
Platelets: 188 10*3/uL (ref 150–400)
RBC: 4.06 MIL/uL — ABNORMAL LOW (ref 4.22–5.81)
RDW: 13.9 % (ref 11.5–15.5)
WBC: 5.3 10*3/uL (ref 4.0–10.5)

## 2014-03-24 ENCOUNTER — Telehealth (HOSPITAL_COMMUNITY): Payer: Self-pay | Admitting: *Deleted

## 2014-03-24 DIAGNOSIS — F259 Schizoaffective disorder, unspecified: Secondary | ICD-10-CM

## 2014-03-24 MED ORDER — CLOZAPINE 100 MG PO TABS: 100.0000 mg | ORAL_TABLET | Freq: Two times a day (BID) | ORAL | Status: DC

## 2014-03-24 MED ORDER — CLOZAPINE 200 MG PO TABS: 400.0000 mg | ORAL_TABLET | Freq: Every day | ORAL | Status: DC

## 2014-03-24 NOTE — Telephone Encounter (Signed)
Received 2 messages from Ms.Farmer at [employer] refills of Clozapine called to [facility]. Per chart, written RX given to patient at appt 03/16/14. Contacted [facility]: Spoke with  Licensed conveyancer.No printed prescriptions dated4/15/15 recv'd by them. He states they will place any written RX received on file. Gave verbal prescription to BWG:[id number] 100 mg, BID & Clozaril 200 mg-2 at bedtime.He states pharmacy will use ALL 100 mg tablets as no 200 mg tablets available.  [place] to notify that RX called to [facility]. Per Jana Half, they did not receive written Rx on 4/15.She thinks his mother may have taken them home with her.

## 2014-03-29 LAB — CLOZAPINE (CLOZARIL)
Clozapine Lvl: 574 mcg/L
NorClozapine: 340 mcg/L (ref 25–400)

## 2014-04-07 ENCOUNTER — Other Ambulatory Visit (HOSPITAL_COMMUNITY): Payer: Self-pay | Admitting: Psychiatry

## 2014-04-07 ENCOUNTER — Encounter (HOSPITAL_COMMUNITY): Payer: Self-pay | Admitting: *Deleted

## 2014-04-07 LAB — CBC WITH DIFFERENTIAL/PLATELET
Basophils Absolute: 0 10*3/uL (ref 0.0–0.1)
EOS ABS: 0 10*3/uL (ref 0.0–0.7)
HEMATOCRIT: 35 % — AB (ref 39.0–52.0)
HEMOGLOBIN: 11.7 g/dL — AB (ref 13.0–17.0)
Lymphocytes Relative: 38 % (ref 12–46)
Lymphs Abs: 1.6 10*3/uL (ref 0.7–4.0)
MCH: 31.1 pg (ref 26.0–34.0)
MCHC: 33.3 g/dL (ref 30.0–36.0)
MCV: 93.2 fL (ref 78.0–100.0)
MONOS PCT: 6 % (ref 3–12)
Monocytes Absolute: 0.3 10*3/uL (ref 0.1–1.0)
NEUTROS ABS: 2.4 10*3/uL (ref 1.7–7.7)
Neutrophils Relative %: 56 % (ref 43–77)
Platelets: 139 10*3/uL — ABNORMAL LOW (ref 150–400)
RBC: 3.76 MIL/uL — ABNORMAL LOW (ref 4.22–5.81)
RDW: 12.7 % (ref 11.5–15.5)
WBC: 4.2 10*3/uL (ref 4.0–10.5)

## 2014-04-07 NOTE — Progress Notes (Signed)
Vistaril (Hydroxyzine Pamoate) Capsules authorized by [facility] Medication not on Formulary, per representative Latoya. Not authorized for treatment of Schizoaffective Disorder. Approved for treatment of anxiety/panic attacks Auth # [id number], effective 12/02/13 thru 12/01/14 [place] at [place] notified by phone 5/7 @ [place] [facility]-patient's care home - notified by phone that authorization completed/pharamcy notifed

## 2014-04-11 LAB — CLOZAPINE (CLOZARIL)
Clozapine Lvl: 661 mcg/L
NorClozapine: 292 mcg/L (ref 25–400)

## 2014-04-21 ENCOUNTER — Other Ambulatory Visit: Payer: Self-pay

## 2014-04-21 ENCOUNTER — Emergency Department (HOSPITAL_COMMUNITY)
Admission: EM | Admit: 2014-04-21 | Discharge: 2014-05-13 | Disposition: A | Discharge status: Home / Self Care | Payer: Medicare Other

## 2014-04-21 ENCOUNTER — Ambulatory Visit (HOSPITAL_COMMUNITY): Payer: Self-pay | Admitting: Psychology

## 2014-04-21 ENCOUNTER — Other Ambulatory Visit (HOSPITAL_COMMUNITY): Payer: Self-pay | Admitting: Psychiatry

## 2014-04-21 ENCOUNTER — Inpatient Hospital Stay (HOSPITAL_COMMUNITY): Admission: RE | Admit: 2014-04-21 | Payer: Self-pay | Source: Home / Self Care | Admitting: Psychiatry

## 2014-04-21 ENCOUNTER — Encounter (HOSPITAL_COMMUNITY): Payer: Self-pay | Admitting: Emergency Medicine

## 2014-04-21 ENCOUNTER — Telehealth (HOSPITAL_COMMUNITY): Payer: Self-pay

## 2014-04-21 ENCOUNTER — Inpatient Hospital Stay (HOSPITAL_COMMUNITY)
Admission: RE | Admit: 2014-04-21 | Discharge: 2014-05-04 | DRG: 885 | Disposition: A | Discharge status: Home / Self Care | Payer: Medicare Other | Attending: Psychiatry | Admitting: Psychiatry

## 2014-04-21 DIAGNOSIS — N183 Chronic kidney disease, stage 3 unspecified: Secondary | ICD-10-CM | POA: Diagnosis present

## 2014-04-21 DIAGNOSIS — F410 Panic disorder [episodic paroxysmal anxiety] without agoraphobia: Secondary | ICD-10-CM | POA: Diagnosis present

## 2014-04-21 DIAGNOSIS — F419 Anxiety disorder, unspecified: Secondary | ICD-10-CM

## 2014-04-21 DIAGNOSIS — F4001 Agoraphobia with panic disorder: Secondary | ICD-10-CM

## 2014-04-21 DIAGNOSIS — R4182 Altered mental status, unspecified: Secondary | ICD-10-CM

## 2014-04-21 DIAGNOSIS — R748 Abnormal levels of other serum enzymes: Secondary | ICD-10-CM

## 2014-04-21 DIAGNOSIS — F251 Schizoaffective disorder, depressive type: Secondary | ICD-10-CM | POA: Diagnosis present

## 2014-04-21 DIAGNOSIS — F259 Schizoaffective disorder, unspecified: Principal | ICD-10-CM | POA: Diagnosis present

## 2014-04-21 DIAGNOSIS — Z7982 Long term (current) use of aspirin: Secondary | ICD-10-CM

## 2014-04-21 DIAGNOSIS — M545 Low back pain, unspecified: Secondary | ICD-10-CM

## 2014-04-21 DIAGNOSIS — E872 Acidosis, unspecified: Secondary | ICD-10-CM

## 2014-04-21 DIAGNOSIS — D649 Anemia, unspecified: Secondary | ICD-10-CM

## 2014-04-21 DIAGNOSIS — N179 Acute kidney failure, unspecified: Secondary | ICD-10-CM

## 2014-04-21 DIAGNOSIS — G259 Extrapyramidal and movement disorder, unspecified: Secondary | ICD-10-CM

## 2014-04-21 DIAGNOSIS — J9600 Acute respiratory failure, unspecified whether with hypoxia or hypercapnia: Secondary | ICD-10-CM

## 2014-04-21 DIAGNOSIS — F1010 Alcohol abuse, uncomplicated: Secondary | ICD-10-CM | POA: Diagnosis present

## 2014-04-21 DIAGNOSIS — E785 Hyperlipidemia, unspecified: Secondary | ICD-10-CM | POA: Diagnosis present

## 2014-04-21 DIAGNOSIS — G9340 Encephalopathy, unspecified: Secondary | ICD-10-CM

## 2014-04-21 DIAGNOSIS — G40909 Epilepsy, unspecified, not intractable, without status epilepticus: Secondary | ICD-10-CM | POA: Diagnosis present

## 2014-04-21 DIAGNOSIS — G40901 Epilepsy, unspecified, not intractable, with status epilepticus: Secondary | ICD-10-CM

## 2014-04-21 LAB — CBC WITH DIFFERENTIAL/PLATELET
Basophils Absolute: 0 10*3/uL (ref 0.0–0.1)
Basophils Absolute: 0 10*3/uL (ref 0.0–0.1)
Basophils Relative: 0 % (ref 0–1)
Basophils Relative: 0 % (ref 0–1)
EOS ABS: 0.3 10*3/uL (ref 0.0–0.7)
EOS PCT: 1 % (ref 0–5)
Eosinophils Absolute: 0.1 10*3/uL (ref 0.0–0.7)
Eosinophils Relative: 2 % (ref 0–5)
HCT: 36.1 % — ABNORMAL LOW (ref 39.0–52.0)
HCT: 35.4 % — ABNORMAL LOW (ref 39.0–52.0)
HEMOGLOBIN: 12.6 g/dL — AB (ref 13.0–17.0)
HEMOGLOBIN: 12 g/dL — AB (ref 13.0–17.0)
LYMPHS ABS: 1.7 10*3/uL (ref 0.7–4.0)
LYMPHS ABS: 1.1 10*3/uL (ref 0.7–4.0)
Lymphocytes Relative: 13 % (ref 12–46)
Lymphocytes Relative: 10 % — ABNORMAL LOW (ref 12–46)
MCH: 31.1 pg (ref 26.0–34.0)
MCH: 30.5 pg (ref 26.0–34.0)
MCHC: 34.9 g/dL (ref 30.0–36.0)
MCHC: 33.9 g/dL (ref 30.0–36.0)
MCV: 89.1 fL (ref 78.0–100.0)
MCV: 89.8 fL (ref 78.0–100.0)
MONOS PCT: 8 % (ref 3–12)
MONOS PCT: 10 % (ref 3–12)
Monocytes Absolute: 1.1 10*3/uL — ABNORMAL HIGH (ref 0.1–1.0)
Monocytes Absolute: 1.1 10*3/uL — ABNORMAL HIGH (ref 0.1–1.0)
Neutro Abs: 10.3 10*3/uL — ABNORMAL HIGH (ref 1.7–7.7)
Neutro Abs: 8.4 10*3/uL — ABNORMAL HIGH (ref 1.7–7.7)
Neutrophils Relative %: 77 % (ref 43–77)
Neutrophils Relative %: 79 % — ABNORMAL HIGH (ref 43–77)
PLATELETS: 178 10*3/uL (ref 150–400)
Platelets: 155 10*3/uL (ref 150–400)
RBC: 4.05 MIL/uL — AB (ref 4.22–5.81)
RBC: 3.94 MIL/uL — AB (ref 4.22–5.81)
RDW: 13.6 % (ref 11.5–15.5)
RDW: 12.5 % (ref 11.5–15.5)
WBC: 13.4 10*3/uL — ABNORMAL HIGH (ref 4.0–10.5)
WBC: 10.8 10*3/uL — ABNORMAL HIGH (ref 4.0–10.5)

## 2014-04-21 LAB — COMPREHENSIVE METABOLIC PANEL
ALBUMIN: 4.4 g/dL (ref 3.5–5.2)
ALT: 13 U/L (ref 0–53)
AST: 24 U/L (ref 0–37)
Alkaline Phosphatase: 144 U/L — ABNORMAL HIGH (ref 39–117)
BUN: 29 mg/dL — AB (ref 6–23)
CALCIUM: 9.4 mg/dL (ref 8.4–10.5)
CO2: 23 mEq/L (ref 19–32)
CREATININE: 1.67 mg/dL — AB (ref 0.50–1.35)
Chloride: 107 mEq/L (ref 96–112)
GFR calc non Af Amer: 47 mL/min — ABNORMAL LOW (ref >90)
GFR, EST AFRICAN AMERICAN: 54 mL/min — AB (ref >90)
GLUCOSE: 119 mg/dL — AB (ref 70–99)
Potassium: 4.8 mEq/L (ref 3.7–5.3)
Sodium: 145 mEq/L (ref 137–147)
Total Bilirubin: 0.4 mg/dL (ref 0.3–1.2)
Total Protein: 7.6 g/dL (ref 6.0–8.3)

## 2014-04-21 LAB — ETHANOL: Alcohol, Ethyl (B): <11 mg/dL (ref 0–11)

## 2014-04-21 LAB — RAPID URINE DRUG SCREEN, HOSP PERFORMED
AMPHETAMINES: NOT DETECTED
BENZODIAZEPINES: NOT DETECTED
Barbiturates: NOT DETECTED
COCAINE: NOT DETECTED
OPIATES: NOT DETECTED
Tetrahydrocannabinol: NOT DETECTED

## 2014-04-21 LAB — GLUCOSE, CAPILLARY: Glucose-Capillary: 89 mg/dL (ref 70–99)

## 2014-04-21 MED ORDER — SODIUM CHLORIDE 0.9 % IV SOLN
1000.0000 mL | INTRAVENOUS | Status: DC
  Administered 2014-04-21: 1000 mL via INTRAVENOUS
  Filled 2014-04-21: qty 1000

## 2014-04-21 MED ORDER — BENZTROPINE MESYLATE 1 MG/ML IJ SOLN
1.0000 mg | Freq: Once | INTRAMUSCULAR | Status: DC
  Filled 2014-04-21: qty 2

## 2014-04-21 MED ORDER — ALUM & MAG HYDROXIDE-SIMETH 200-200-20 MG/5ML PO SUSP: 30.0000 mL | ORAL | Status: DC | PRN

## 2014-04-21 MED ORDER — MAGNESIUM HYDROXIDE 400 MG/5ML PO SUSP: 30.0000 mL | Freq: Every day | ORAL | Status: DC | PRN

## 2014-04-21 MED ORDER — ACETAMINOPHEN 325 MG PO TABS: 650.0000 mg | ORAL_TABLET | Freq: Four times a day (QID) | ORAL | Status: DC | PRN

## 2014-04-21 MED ORDER — LORAZEPAM 2 MG/ML IJ SOLN
1.0000 mg | Freq: Once | INTRAMUSCULAR | Status: AC
  Administered 2014-04-21: 1 mg via INTRAVENOUS
  Filled 2014-04-21: qty 1

## 2014-04-21 MED ORDER — SODIUM CHLORIDE 0.9 % IV SOLN
1000.0000 mL | Freq: Once | INTRAVENOUS | Status: AC
  Administered 2014-04-21: 1000 mL via INTRAVENOUS

## 2014-04-21 MED ORDER — BENZTROPINE MESYLATE 1 MG/ML IJ SOLN
1.0000 mg | Freq: Once | INTRAMUSCULAR | Status: AC
  Administered 2014-04-21: 1 mg via INTRAVENOUS

## 2014-04-21 NOTE — BH Assessment (Signed)
Assessment Note  Brady Hoffman is a 48 y.o. single white male.  He presents accompanied by his mother, Brady Hoffman [id number]) who remains for assessment at the request of the pt.  Pt reportedly presented at the [facility] today, where he is seen by Berniece Andreas, MD.  Dr Adele Schilder was reportedly not able to see the pt today, and pt was advised to present for assessment by Therapeutic Triage.  He is here today due to complaint of a persistent panic attack.  During assessment pt for the most part looks down while rubbing his chin against his chest and grinding his teeth.  His responses to questions are mostly single words.  At times he offers lengthier replies, but he speaks rapidly and is difficult to understand.  His mother offers collateral information.  Stressors: Pt is not able to identify any recent stressors.  In 11/2013 he was admitted to [facility] for 12 days for kidney failure and a possible seizure.  During that admission he was taken off of Prolixin and started on Keppra.  Lethality: Suicidality: Pt reports that he has been experiencing SI, but denies any plan or intent.  He does not feel that he would act on these thoughts if he remained in the community, but he reports a history of two attempts in the past.  In 1988 he superficially cut his neck, and in 2005 he superficially cut his wrist.  When asked what precipitated these events, pt states, "I wanted to go in the hospital...so I could relax."  Pt denies any history of self mutilation, but during assessment he is very tense.  He grinds his teeth audibly and rubs the stubble on his chin against his chest, chafing his skin.  Pt denies any recent problems with depression, but endorses anhedonia over the past 2 days.  He report panic attacks about once a week, with the most recent starting yesterday.  He reports that it persists to the present time, but denies many associated symptoms including tachycardia,  chest pain, or "fight-or-flight" impulse.  He does, however, endorse diaphoresis, and appears to be acutely anxious. Homicidality: Pt denies homicidal thoughts or physical aggression.  Pt denies having access to firearms.  Pt denies having any legal problems at this time.  Pt is tense and physically agitated, but cooperative during assessment. Psychosis: Pt denies hallucinations.  He endorses persecutory delusions, feeling that the [employer] is inducing his panic attacks in order to harm him.  However, he knows that this is not, in fact, true.  Pt's reality testing in intact.  His thought processes are linear and goal directed, and his impulse control is good.  He does not appear to be responding to internal stimuli. Substance Abuse: Pt denies any current or past substance abuse problems.  Pt does not appear to be intoxicated or in withdrawal at this time.  Social History: Pt has lived at the [facility] for the past 12 years.  He reports getting along well with staff and peers, adding "they're nice."  He identifies them as his main support.  His mother also appears to be supportive and pt appears to trust her.  She reports that pt was adopted at 79 months of age, and nothing is known about his biological family.  Pt has never been married.  He receives disability benefits.  Treatment History: Pt reports that his most recent psychiatric hospitalization was at [facility] about 6 or 7 years ago.  He has been admitted  to [facility] twice, and was also admitted to 5000 unit at [facility] in the distant past.  Additionally he has been admitted to [facility], [facility], and [facility].  Pt started seeing Berniece Andreas, MD for psychiatry in 01/2014 or 01/2014.  He has an intake appointment scheduled for 04/26/2014 with Joylene John in the [facility].  Today pt is willing to be admitted to [facility] if it is believed to be in his interest.  Axis I: Schizoaffective Disorder 295.70 Axis II: Deferred  799.9 Axis III:  Past Medical History  Diagnosis Date  . Hyperlipidemia   . Schizoaffective disorder   . H/O: GI bleed   . Seizures   . Depression   . Chronic kidney disease   . Anemia 01/14/2014  . Anxiety attack    Axis IV: other psychosocial or environmental problems Axis V: GAF = 35  Past Medical History:  Past Medical History  Diagnosis Date  . Hyperlipidemia   . Schizoaffective disorder   . H/O: GI bleed   . Seizures   . Depression   . Chronic kidney disease   . Anemia 01/14/2014  . Anxiety attack     Past Surgical History  Procedure Laterality Date  . Mole removal    . Wisdom tooth extraction    . Esophagogastroduodenoscopy  11/01/2011    Procedure: ESOPHAGOGASTRODUODENOSCOPY (EGD);  Surgeon: Beryle Beams;  Location: [facility];  Service: Endoscopy;  Laterality: N/A;    Family History:  Family History  Problem Relation Age of Onset  . Adopted: Yes    Social History:  reports that he has never smoked. He has never used smokeless tobacco. He reports that he does not drink alcohol or use illicit drugs.  Additional Social History:  Alcohol / Drug Use Pain Medications: Denies Prescriptions: Denies Over the Counter: Denies History of alcohol / drug use?: No history of alcohol / drug abuse  CIWA:   COWS:    Allergies:  Allergies  Allergen Reactions  . Risperidone And Related     Pt. States head feels like a rock    Home Medications:  Medications Prior to Admission  Medication Sig Dispense Refill  . aspirin 81 MG chewable tablet Chew 1 tablet (81 mg total) by mouth daily.      Marland Kitchen atorvastatin (LIPITOR) 20 MG tablet Take 1 tablet (20 mg total) by mouth at bedtime.  30 tablet  1  . benztropine (COGENTIN) 2 MG tablet Take 1 tablet (2 mg total) by mouth at bedtime.  30 tablet  1  . cloZAPine (CLOZARIL) 100 MG tablet Take 1 tablet (100 mg total) by mouth 2 (two) times daily.  60 tablet  1  . clozapine (CLOZARIL) 200 MG tablet Take 2 tablets (400 mg  total) by mouth at bedtime.  60 tablet  1  . folic acid (FOLVITE) 1 MG tablet Take 1 tablet (1 mg total) by mouth daily.  30 tablet  1  . hydrOXYzine (VISTARIL) 25 MG capsule Take 1 capsule (25 mg total) by mouth as needed for anxiety (MAXIMUM ONE DAILY).  30 capsule  0  . levETIRAcetam (KEPPRA XR) 500 MG 24 hr tablet Take 1 tablet (500 mg total) by mouth 2 (two) times daily.  60 tablet  12  . omeprazole (PRILOSEC) 40 MG capsule Take 1 capsule (40 mg total) by mouth daily.  30 capsule  1  . venlafaxine (EFFEXOR) 75 MG tablet Take 1 tablet (75 mg total) by mouth 2 (two) times  daily with a meal.  60 tablet  1    OB/GYN Status:  No LMP for male patient.  General Assessment Data Location of Assessment: [facility] Is this a Tele or Face-to-Face Assessment?: Face-to-Face Is this an Initial Assessment or a Re-assessment for this encounter?: Initial Assessment Living Arrangements: Other (Comment) ([place] [phone]) Can pt return to current living arrangement?: Yes Admission Status: Voluntary Is patient capable of signing voluntary admission?: Yes Transfer from: Group Home Referral Source: Other ([facility] in [place])  Medical Screening Exam ([facility]) Medical Exam completed: No Reason for MSE not completed: Other: (Admit to [facility])  [facility] Crisis Care Plan Living Arrangements: Other (Comment) ([place] [phone]) Name of Psychiatrist: Berniece Andreas, MD Name of Therapist: Joylene John  Education Status Is patient currently in school?: No Contact person: Griffith Santilli (mother) [phone]; [name] (group home staff) [phone] or [phone]  Risk to self Suicidal Ideation: Yes-Currently Present Suicidal Intent: No Is patient at risk for suicide?: No Suicidal Plan?: No Access to Means: No What has been your use of drugs/alcohol within the last 12 months?: Denies Previous Attempts/Gestures: Yes How many  times?: 2 (Cut neck 1988; cut wrist 2005 (both superficially)) Other Self Harm Risks: Pt does not feel he is at risk to harm self Triggers for Past Attempts: Other (Comment) ("I wanted to go in the hospital...so I could relax.") Intentional Self Injurious Behavior: Damaging Comment - Self Injurious Behavior: Abrading chest with chin stubble; grinding teeth Family Suicide History: Unknown (Adopted at 82 months old; family of origin unknown.) Recent stressful life event(s): Other (Comment) (None identified) Persecutory voices/beliefs?: Yes (Feels like [employer] is inducing panic attacks; knows it is not so) Depression: No Depression Symptoms: Loss of interest in usual pleasures (Anhedonic for past couple days.) Substance abuse history and/or treatment for substance abuse?: No Suicide prevention information given to non-admitted patients: Not applicable  Risk to Others Homicidal Ideation: No Thoughts of Harm to Others: No Current Homicidal Intent: No Current Homicidal Plan: No Access to Homicidal Means: No Identified Victim: None History of harm to others?: No Assessment of Violence: None Noted Violent Behavior Description: Physically agitated, but very cooperative. Does patient have access to weapons?: No (Denies having access to firearms) Criminal Charges Pending?: No Does patient have a court date: No  Psychosis Hallucinations: None noted Delusions: Persecutory (Feels like [employer] is inducing panic attacks; knows it is not so)  Mental Status Report Appear/Hygiene: Body odor;Poor hygiene;Other (Comment) (Unshaven, t-shirt is dirty) Eye Contact: Poor (Constantly looking down.) Motor Activity: Restlessness;Rigidity (Grinding teeth) Speech: Other (Comment);Rapid (Flat prosody, rapid enough that he's difficult to understand) Level of Consciousness: Alert Mood: Anxious Affect: Blunted Anxiety Level: Severe (Panic attacks 1x/week, most recent in past 24 hours.) Thought Processes:  Coherent;Relevant Judgement: Unimpaired Orientation: Person;Place;Time;Situation Obsessive Compulsive Thoughts/Behaviors: None  Cognitive Functioning Concentration: Normal Memory: Recent Intact;Remote Intact IQ: Average Insight: Fair Impulse Control: Good Appetite: Good (Increased consumption of junk food) Weight Loss: 0 Weight Gain: 20 (over past month) Sleep: No Change Total Hours of Sleep: 10 Vegetative Symptoms: Staying in bed  ADLScreening [facility]) Patient's cognitive ability adequate to safely complete daily activities?: Yes Patient able to express need for assistance with ADLs?: Yes Independently performs ADLs?: Yes (appropriate for developmental age)  Prior Inpatient Therapy Prior Inpatient Therapy: Yes Prior Therapy Dates: Most recent 54 - 7 yrs ago: [facility] x 2; also [facility] Prior Therapy Facilty/Provider(s): Past: [facility], [facility], [facility]  Prior  Outpatient Therapy Prior Outpatient Therapy: Yes Prior Therapy Dates: 01/2014 or 01/2014: Berniece Andreas, MD for psychiatry Prior Therapy Facilty/Provider(s): Intake scheduled 04/26/2014: Joylene John @ [facility]  ADL Screening (condition at time of admission) Patient's cognitive ability adequate to safely complete daily activities?: Yes Is the patient deaf or have difficulty hearing?: No Does the patient have difficulty seeing, even when wearing glasses/contacts?: No Does the patient have difficulty concentrating, remembering, or making decisions?: No Patient able to express need for assistance with ADLs?: Yes Does the patient have difficulty dressing or bathing?: No Independently performs ADLs?: Yes (appropriate for developmental age) Does the patient have difficulty walking or climbing stairs?: No Weakness of Legs: None Weakness of Arms/Hands: None  Home Assistive Devices/Equipment Home Assistive Devices/Equipment: Eyeglasses    Abuse/Neglect Assessment (Assessment to be complete while  patient is alone) Physical Abuse: Denies Verbal Abuse: Denies Sexual Abuse: Denies Exploitation of patient/patient's resources: Denies Self-Neglect: Denies     Regulatory affairs officer (For Healthcare) Advance Directive: Patient does not have advance directive;Patient would not like information Pre-existing out of facility DNR order (yellow form or pink MOST form): No Nutrition Screen- [facility] Patient's home diet: Regular  Additional Information 1:1 In Past 12 Months?: No CIRT Risk: No Elopement Risk: No Does patient have medical clearance?: No     Disposition:  Disposition Initial Assessment Completed for this Encounter: Yes Disposition of Patient: Inpatient treatment program Type of inpatient treatment program: Adult After consulting with Catalina Pizza, NP it has been determined that pt presents a life threatening danger to himself for which psychiatric hospitalization is indicated.  Pt admitted to [facility] to the service of Corena Pilgrim, MD, Rm [id number].  Pt signed Voluntary Admission and Consent for Treatment.  On Site Evaluation by:   Reviewed with Physician:  Catalina Pizza, NP @ [place], [place] Triage Specialist Abbe [place] 04/21/2014 6:51 PM

## 2014-04-21 NOTE — ED Provider Notes (Signed)
CSN: [id number]     Arrival date & time 04/21/14  1939 History   First MD Initiated Contact with Patient 04/21/14 1941     Chief Complaint  Patient presents with  . extrapyramidal sx    HPI Patient states he came to the emergency room because she's having trouble with a panic attack. Patient has history of schizoaffective disorder. Patient denies trouble with suicidal or homicidal ideation. [facility] While there they noted that he was tachycardic. Is also frequently clinching and clinching his jaw. He felt that he was diaphoretic. They sent him to the emergency room for a medical evaluation. Past Medical History  Diagnosis Date  . Hyperlipidemia   . Schizoaffective disorder   . H/O: GI bleed   . Seizures   . Depression   . Chronic kidney disease   . Anemia 01/14/2014  . Anxiety attack    Past Surgical History  Procedure Laterality Date  . Mole removal    . Wisdom tooth extraction    . Esophagogastroduodenoscopy  11/01/2011    Procedure: ESOPHAGOGASTRODUODENOSCOPY (EGD);  Surgeon: Beryle Beams;  Location: [facility];  Service: Endoscopy;  Laterality: N/A;   Family History  Problem Relation Age of Onset  . Adopted: Yes   History  Substance Use Topics  . Smoking status: Never Smoker   . Smokeless tobacco: Never Used  . Alcohol Use: No    Review of Systems  Constitutional: Negative for fever.  HENT: Negative for voice change.   Respiratory: Negative for shortness of breath.   Cardiovascular: Negative for chest pain and palpitations.  Gastrointestinal: Negative for abdominal pain.  Musculoskeletal: Negative for joint swelling and neck pain.  Skin: Negative for color change.  Neurological: Positive for tremors. Negative for speech difficulty, weakness and numbness (no paresthesias).       No muscle weakness  Psychiatric/Behavioral: Negative for confusion.  All other systems reviewed and are negative.     Allergies  Risperidone  and related  Home Medications   Prior to Admission medications   Medication Sig Start Date End Date Taking? Authorizing Provider  aspirin 81 MG chewable tablet Chew 1 tablet (81 mg total) by mouth daily. 01/21/14   Lavon Paganini Angiulli, PA-C  atorvastatin (LIPITOR) 20 MG tablet Take 1 tablet (20 mg total) by mouth at bedtime. 01/21/14   Lavon Paganini Angiulli, PA-C  benztropine (COGENTIN) 2 MG tablet Take 1 tablet (2 mg total) by mouth at bedtime. 03/16/14   Kathlee Nations, MD  cloZAPine (CLOZARIL) 100 MG tablet Take 1 tablet (100 mg total) by mouth 2 (two) times daily. 03/24/14   Kathlee Nations, MD  clozapine (CLOZARIL) 200 MG tablet Take 2 tablets (400 mg total) by mouth at bedtime. 03/24/14   Kathlee Nations, MD  folic acid (FOLVITE) 1 MG tablet Take 1 tablet (1 mg total) by mouth daily. 01/21/14   Lavon Paganini Angiulli, PA-C  hydrOXYzine (VISTARIL) 25 MG capsule Take 1 capsule (25 mg total) by mouth as needed for anxiety (MAXIMUM ONE DAILY). 03/16/14   Kathlee Nations, MD  levETIRAcetam (KEPPRA XR) 500 MG 24 hr tablet Take 1 tablet (500 mg total) by mouth 2 (two) times daily. 03/01/14   Penni Bombard, MD  omeprazole (PRILOSEC) 40 MG capsule Take 1 capsule (40 mg total) by mouth daily. 01/21/14   Lavon Paganini Angiulli, PA-C  venlafaxine (EFFEXOR) 75 MG tablet Take 1 tablet (75 mg total) by mouth 2 (two) times daily  with a meal. 03/16/14   Kathlee Nations, MD   BP 128/87  Pulse 102  Temp(Src) 98 F (36.7 C) (Oral)  Resp 18  Ht 6\' 3"  (1.905 m)  Wt 210 lb (95.255 kg)  BMI 26.25 kg/m2  SpO2 98% Physical Exam  Nursing note and vitals reviewed. Constitutional: He appears well-developed and well-nourished. No distress.  HENT:  Head: Normocephalic and atraumatic.  Right Ear: External ear normal.  Left Ear: External ear normal.  Eyes: Conjunctivae are normal. Right eye exhibits no discharge. Left eye exhibits no discharge. No scleral icterus.  Neck: Neck supple. No tracheal deviation present.  Cardiovascular: Normal  rate, regular rhythm and intact distal pulses.   Pulmonary/Chest: Effort normal and breath sounds normal. No stridor. No respiratory distress. He has no wheezes. He has no rales.  Abdominal: Soft. Bowel sounds are normal. He exhibits no distension. There is no tenderness. There is no rebound and no guarding.  Musculoskeletal: He exhibits no edema and no tenderness.  Frequent clenching of jaw, mild tremor which decreases at rest  Neurological: He is alert. He has normal strength. No cranial nerve deficit (no facial droop, extraocular movements intact, no slurred speech) or sensory deficit. He exhibits normal muscle tone. He displays no seizure activity. Coordination normal.  Skin: Skin is warm and dry. No rash noted.  Psychiatric: His affect is blunt. His affect is not labile. His speech is not rapid and/or pressured. He is not agitated and not aggressive. Thought content is not delusional. He expresses no suicidal plans and no homicidal plans.    ED Course  Procedures (including critical care time) Labs Review Labs Reviewed  CBC WITH DIFFERENTIAL - Abnormal; Notable for the following:    WBC 10.8 (*)    RBC 3.94 (*)    Hemoglobin 12.0 (*)    HCT 35.4 (*)    Neutrophils Relative % 79 (*)    Neutro Abs 8.4 (*)    Lymphocytes Relative 10 (*)    Monocytes Absolute 1.1 (*)    All other components within normal limits  COMPREHENSIVE METABOLIC PANEL - Abnormal; Notable for the following:    Glucose, Bld 119 (*)    BUN 29 (*)    Creatinine, Ser 1.67 (*)    Alkaline Phosphatase 144 (*)    GFR calc non Af Amer 47 (*)    GFR calc Af Amer 54 (*)    All other components within normal limits  GLUCOSE, CAPILLARY  URINE RAPID DRUG SCREEN (HOSP PERFORMED)  ETHANOL     EKG Normal sinus rhythm rate 99 Normal axis, normal intervals Normal ST-T wave MDM   Final diagnoses:  Anxiety  Extrapyramidal and movement disorder    Decreased liklihood of extraparametal symptoms and neuroleptic  malignant syndrome with clozapine still a possibility. At the bedside the patient is in no distress. He is afebrile. His tachycardia has decreased. I will plan on checking laboratory tests and I will give him IV fluids as well as a dose of Ativan and Cogentin. Will monitor   2010  Labs and EKG are reassuring.  BUN and CT are at baseline.  HR decreased to 99 on EKG.  Pt was given iv fluids, ativan and cogentin.  At this time stable from a medical standpoint.  Pt will be admitted to behavioral health..  He already has admission orders    Dorie Rank, MD 04/21/14 2212

## 2014-04-21 NOTE — ED Notes (Signed)
Pt aware of need for urine sample, unable to urinate at this time.

## 2014-04-21 NOTE — ED Notes (Signed)
Pt could not urinate.  Informed Pt that we would check again in 30 minutes.

## 2014-04-21 NOTE — ED Notes (Deleted)
Per EMS: Pt appears very diaphoretic and having extrapyramidal sx, clenching and unclenching jaw. Pt voluntarily committed. A&O x 4. Ambulatory.

## 2014-04-21 NOTE — ED Notes (Signed)
Pt reports he was having a panic attack while at [facility]. Denies any pain or other sx at this time.

## 2014-04-21 NOTE — Telephone Encounter (Signed)
I returned the phone call.  Patient was not there.

## 2014-04-21 NOTE — ED Notes (Signed)
Per EMS: Pt from [facility], onset of extrapyramidal sx today, posturing noted and pt continuously clenching and unclenching jaw. Pt also appears very diaphoretic. Denies pain. Pt is voluntary.

## 2014-04-22 ENCOUNTER — Telehealth (HOSPITAL_COMMUNITY): Payer: Self-pay

## 2014-04-22 ENCOUNTER — Encounter (HOSPITAL_COMMUNITY): Payer: Self-pay

## 2014-04-22 DIAGNOSIS — F4001 Agoraphobia with panic disorder: Secondary | ICD-10-CM | POA: Diagnosis present

## 2014-04-22 DIAGNOSIS — F259 Schizoaffective disorder, unspecified: Principal | ICD-10-CM

## 2014-04-22 LAB — CK: CK TOTAL: 438 U/L — AB (ref 7–232)

## 2014-04-22 MED ORDER — LEVETIRACETAM ER 500 MG PO TB24
500.0000 mg | ORAL_TABLET | Freq: Two times a day (BID) | ORAL | Status: DC
  Administered 2014-04-22 – 2014-05-04 (×25): 500 mg via ORAL
  Filled 2014-04-22 (×8): qty 1
  Filled 2014-04-22 (×2): qty 6
  Filled 2014-04-22 (×9): qty 1
  Filled 2014-04-22: qty 6
  Filled 2014-04-22 (×2): qty 1
  Filled 2014-04-22: qty 6
  Filled 2014-04-22 (×10): qty 1

## 2014-04-22 MED ORDER — CLOZAPINE 100 MG PO TABS
400.0000 mg | ORAL_TABLET | Freq: Every day | ORAL | Status: DC
  Administered 2014-04-22 – 2014-04-25 (×5): 400 mg via ORAL
  Filled 2014-04-22 (×7): qty 4

## 2014-04-22 MED ORDER — FLUOXETINE HCL 20 MG PO CAPS
20.0000 mg | ORAL_CAPSULE | Freq: Every day | ORAL | Status: DC
  Administered 2014-04-23 – 2014-05-04 (×12): 20 mg via ORAL
  Filled 2014-04-22 (×3): qty 1
  Filled 2014-04-22: qty 3
  Filled 2014-04-22 (×5): qty 1
  Filled 2014-04-22: qty 3
  Filled 2014-04-22 (×6): qty 1

## 2014-04-22 MED ORDER — PANTOPRAZOLE SODIUM 40 MG PO TBEC
80.0000 mg | DELAYED_RELEASE_TABLET | Freq: Every day | ORAL | Status: DC
  Administered 2014-04-22 – 2014-04-24 (×3): 80 mg via ORAL
  Filled 2014-04-22 (×5): qty 2

## 2014-04-22 MED ORDER — BENZTROPINE MESYLATE 2 MG PO TABS
2.0000 mg | ORAL_TABLET | Freq: Every day | ORAL | Status: DC
  Administered 2014-04-22 – 2014-05-03 (×13): 2 mg via ORAL
  Filled 2014-04-22 (×5): qty 1
  Filled 2014-04-22: qty 3
  Filled 2014-04-22: qty 1
  Filled 2014-04-22: qty 2
  Filled 2014-04-22 (×4): qty 1
  Filled 2014-04-22: qty 3
  Filled 2014-04-22 (×2): qty 1
  Filled 2014-04-22: qty 2
  Filled 2014-04-22 (×2): qty 1

## 2014-04-22 MED ORDER — ATORVASTATIN CALCIUM 20 MG PO TABS
20.0000 mg | ORAL_TABLET | Freq: Every day | ORAL | Status: DC
  Administered 2014-04-22: 20 mg via ORAL
  Filled 2014-04-22 (×3): qty 1

## 2014-04-22 MED ORDER — FOLIC ACID 1 MG PO TABS
1.0000 mg | ORAL_TABLET | Freq: Every day | ORAL | Status: DC
  Administered 2014-04-22 – 2014-05-04 (×13): 1 mg via ORAL
  Filled 2014-04-22 (×15): qty 1

## 2014-04-22 MED ORDER — ASPIRIN 81 MG PO CHEW
81.0000 mg | CHEWABLE_TABLET | Freq: Every day | ORAL | Status: DC
  Administered 2014-04-22 – 2014-05-04 (×13): 81 mg via ORAL
  Filled 2014-04-22 (×15): qty 1

## 2014-04-22 MED ORDER — FLUOXETINE HCL 20 MG PO CAPS: 20.0000 mg | ORAL_CAPSULE | Freq: Every day | ORAL | Status: DC

## 2014-04-22 MED ORDER — CLOZAPINE 100 MG PO TABS
100.0000 mg | ORAL_TABLET | Freq: Two times a day (BID) | ORAL | Status: DC
  Administered 2014-04-22 – 2014-05-04 (×25): 100 mg via ORAL
  Filled 2014-04-22 (×5): qty 1
  Filled 2014-04-22: qty 6
  Filled 2014-04-22 (×3): qty 1
  Filled 2014-04-22: qty 6
  Filled 2014-04-22 (×10): qty 1
  Filled 2014-04-22: qty 6
  Filled 2014-04-22 (×10): qty 1
  Filled 2014-04-22: qty 6

## 2014-04-22 MED ORDER — CLONAZEPAM 0.5 MG PO TABS
0.5000 mg | ORAL_TABLET | Freq: Two times a day (BID) | ORAL | Status: DC
  Administered 2014-04-22 – 2014-05-02 (×20): 0.5 mg via ORAL
  Filled 2014-04-22 (×20): qty 1

## 2014-04-22 MED ORDER — VENLAFAXINE HCL 75 MG PO TABS
75.0000 mg | ORAL_TABLET | Freq: Two times a day (BID) | ORAL | Status: DC
  Administered 2014-04-22: 75 mg via ORAL
  Filled 2014-04-22 (×3): qty 1
  Filled 2014-04-22: qty 2

## 2014-04-22 NOTE — Progress Notes (Signed)
Pt is a 48 year old male admitted with Schizoaffective disorder and anxiety    When he first got here he was sweating profusely was tachycardic showing some moderate EPS   He was transported to [facility] for evaluation and medical clearance   There he received 1 liter of fluids and ativan and cogentin    He was sent back to [facility] in stable and calmer condition    He continues to have some mild EPS  In his legs and mouth  See AIMS    Pt said he came to hospital to get some rest   Pt has a history of seizures  He also has a history of self mutilation he does endorse self harm thoughts but no plan   He reports panic attacks and severe anxiety    Pt was oriented to unit and offered nourishment   Medications administered and will evaluate effectiveness   Q 15 min checks   Offered nourishment   Pt in bed resting with eyes closed  No distress noted

## 2014-04-22 NOTE — [facility] Suicide Risk Assessment (Signed)
   Nursing information obtained from:    Demographic factors:    Current Mental Status:    Loss Factors:    Historical Factors:    Risk Reduction Factors:    Total Time spent with patient: 20 minutes  CLINICAL FACTORS:   Severe Anxiety and/or Agitation  Psychiatric Specialty Exam: Physical Exam  Psychiatric: His speech is normal and behavior is normal. Judgment normal. His mood appears anxious. Thought content is paranoid and delusional. Cognition and memory are normal.    Review of Systems  Constitutional: Negative.   HENT: Negative.   Eyes: Negative.   Respiratory: Negative.   Cardiovascular: Negative.   Gastrointestinal: Negative.   Genitourinary: Negative.   Musculoskeletal: Negative.   Skin: Negative.   Neurological: Negative.   Endo/Heme/Allergies: Negative.   Psychiatric/Behavioral: Positive for depression and hallucinations. The patient is nervous/anxious and has insomnia.     Blood pressure 145/102, pulse 92, temperature 98.2 F (36.8 C), temperature source Oral, resp. rate 18, height 6\' 3"  (1.905 m), weight 95.255 kg (210 lb), SpO2 99.00%.Body mass index is 26.25 kg/(m^2).  General Appearance: Disheveled  Eye Contact::  Minimal  Speech:  Pressured  Volume:  Decreased  Mood:  Anxious  Affect:  Constricted  Thought Process:  Circumstantial and Disorganized  Orientation:  Full (Time, Place, and Person)  Thought Content:  Delusions  Suicidal Thoughts:  No  Homicidal Thoughts:  No  Memory:  Immediate;   Fair Recent;   Fair Remote;   Fair  Judgement:  Impaired  Insight:  Lacking  Psychomotor Activity:  Decreased  Concentration:  Fair  Recall:  [place]: Fair  Akathisia:  No  Handed:  Right  AIMS (if indicated):     Assets:  Communication Skills Desire for Improvement Physical Health  Sleep:  Number of Hours: 3   Musculoskeletal: Strength & Muscle Tone: within normal limits Gait & Station: normal Patient leans:  N/A  COGNITIVE FEATURES THAT CONTRIBUTE TO RISK:  Closed-mindedness Polarized thinking    SUICIDE RISK:   Minimal: No identifiable suicidal ideation.  Patients presenting with no risk factors but with morbid ruminations; may be classified as minimal risk based on the severity of the depressive symptoms  PLAN OF CARE:1. Admit for crisis management and stabilization. 2. Medication management to reduce current symptoms to base line and improve the     patient's overall level of functioning 3. Treat health problems as indicated. 4. Develop treatment plan to decrease risk of relapse upon discharge and the need for     readmission. 5. Psycho-social education regarding relapse prevention and self care. 6. Health care follow up as needed for medical problems. 7. Restart home medications where appropriate.   I certify that inpatient services furnished can reasonably be expected to improve the patient's condition.  Corena Pilgrim, MD 04/22/2014, 11:52 AM

## 2014-04-22 NOTE — Tx Team (Signed)
  Interdisciplinary Treatment Plan Update   Date Reviewed:  04/22/2014  Time Reviewed:  8:16 AM  Progress in Treatment:   Attending groups: No Participating in groups: No Taking medication as prescribed: Yes  Tolerating medication: Yes Family/Significant other contact made: No Patient understands diagnosis: Yes AEB asking for help with panic attacks Discussing patient identified problems/goals with staff: Yes  See initial care plan Medical problems stabilized or resolved: Yes Denies suicidal/homicidal ideation: Yes  In tx team Patient has not harmed self or others: Yes  For review of initial/current patient goals, please see plan of care.  Estimated Length of Stay:  4-5 days  Reason for Continuation of Hospitalization: Anxiety Medication stabilization Other; describe Panic Attacks  New Problems/Goals identified:  N/A  Discharge Plan or Barriers:   return home, follow up outpt  Additional Comments:  Brady Hoffman Nephew is a 68 y.o. single white male. He presents accompanied by his mother, Durelle Zepeda ([phone])  He is here today due to complaint of a persistent panic attack. During assessment pt for the most part looks down while rubbing his chin against his chest and grinding his teeth. His responses to questions are mostly single words. At times he offers lengthier replies, but he speaks rapidly and is difficult to understand.   Attendees:  Signature: Corena Pilgrim, MD 04/22/2014 8:16 AM   Signature: Ripley Fraise, LCSW 04/22/2014 8:16 AM  Signature: Elmarie Shiley, NP 04/22/2014 8:16 AM  Signature: Mayra Neer, RN 04/22/2014 8:16 AM  Signature: Darrol Angel, RN 04/22/2014 8:16 AM  Signature:  04/22/2014 8:16 AM  Signature:   04/22/2014 8:16 AM  Signature:    Signature:    Signature:    Signature:    Signature:    Signature:      Scribe for Treatment Team:   [employer], LCSW  04/22/2014 8:16 AM

## 2014-04-22 NOTE — Progress Notes (Signed)
Patient ID: Brady Hoffman, male   DOB: [birth year], 48 y.o.   MRN: [id number] PER STATE REGULATIONS 482.30  THIS CHART WAS REVIEWED FOR MEDICAL NECESSITY WITH RESPECT TO THE PATIENT'S ADMISSION/ DURATION OF STAY.  NEXT REVIEW DATE: 04/25/2014  Chauncy Lean, RN, BSN CASE MANAGER

## 2014-04-22 NOTE — [facility] Group Notes (Signed)
[place] LCSW Group Therapy  04/22/2014  1:05 PM  Type of Therapy:  Group therapy  Did not attend  Summary of Progress/Problems:  Chaplain was here to lead a group on themes of hope and courage.  Trish Mage 04/22/2014 2:34 PM

## 2014-04-22 NOTE — Progress Notes (Signed)
Patient ID: Reino Lybbert, male   DOB: 1966/04/20, 48 y.o.   MRN: [id number] D. Patient presents with depressed mood, affect flat. Behr reports feeling increased anxiety '' panic attacks'' and states '' I felt shaky and sweaty last night but I'm feeling better this morning , just really tired '' He has been isolative to room, resting quietly in bed throughout most of shift. He denies any pain or acute concerns. Pt mother phoned and requested update regarding patient status. Updated regarding pts behaviors/condition. Mother states '' I feel as though the medications are contributing to some of these panic states, he starts sweating profusely and he's always so restless, he comes to stay with me every other weekend from the group home. '' A. Medications given as ordered. Support and encouragement provided. Discussed above information with Dr. Candelaria Stagers team. R. Pt is in no acute distress at this time and has been noted to be resting quietly in bed throughout most of afternoon. Will continue to monitor q 15 minutes for safety.

## 2014-04-22 NOTE — Progress Notes (Signed)
[place] Group Notes:  (Nursing/MHT/Case Management/Adjunct)  Date:  04/22/2014  Time:  11:24 PM  Type of Therapy:  Group Therapy  Participation Level:  Did Not Attend  Participation Quality:  Did Not Attend  Affect:  Did Not Attend  Cognitive:  Did Not Attend  Insight:  None  Engagement in Group:  Did Not Attend  Modes of Intervention:  Socialization and Support  Summary of Progress/Problems: Pt. Was resting in bed.  Lanell Persons 04/22/2014, 11:24 PM

## 2014-04-22 NOTE — H&P (Signed)
Psychiatric Admission Assessment Adult  Patient Identification:  Brady Hoffman Date of Evaluation:  04/22/2014 Chief Complaint:  "I was having constant panic attacks."  History of Present Illness::  Brady Hoffman is a 48 year old male who was admitted voluntarily for severe panic attacks. He was admitted to [facility] after being medically cleared at [facility]. The patient had been scheduled to see Dr. Adele Schilder at [facility] but was not able to be seen. Patient recently had started at [facility] after being dissatisfied with [facility]. [name] has been complaining of having several panic attacks every weeks for the last few months. When EMS brought the patient to [facility] he appeared very diaphoretic and having EPS. Patient is a very poor historian today during his psychiatric assessment "I had panic attack. It's been a problem for a long time. I have heard voices. I feel paranoid. I have lived in a group home for twelve years. I have an adopted mother who is supportive." Notes from the patient's last appointment with Dr. Adele Schilder who last saw patient 03/16/14 were reviewed to gather more information. On his last visit patient reported recently seeing a Neurologist for seizure management. He also at that time complained of having frequent panic attacks that lasted for several minutes. His irritability, anger and severe mood swings had dramatically improved since being started on Clozaril at some point in the past.   Elements:  Location:  Psychosis, Panic attacks . Quality:  Increased anxiety, paranoia, depression. Severity:  Severe . Timing:  Last few months. Duration:  Chronic . Context:  Ongoing symptoms of panic disorder that have not responded to treatment. . Associated Signs/Synptoms: Depression Symptoms:  depressed mood, psychomotor agitation, difficulty concentrating, anxiety, panic attacks, loss of energy/fatigue, (Hypo) Manic Symptoms:  Denies Anxiety Symptoms:  Excessive Worry, Panic  Symptoms, Psychotic Symptoms:  Delusions, PTSD Symptoms: Denies Total Time spent with patient: 1 hour  Psychiatric Specialty Exam: Physical Exam  Constitutional:  Physical exam findings reviewed from Frederic and I concur with no noted exceptions.     Review of Systems  Constitutional: Negative.   HENT: Negative.   Eyes: Negative.   Respiratory: Negative.   Cardiovascular: Negative.   Gastrointestinal: Negative.   Genitourinary: Negative.   Musculoskeletal: Negative.   Skin: Negative.   Neurological: Negative.   Endo/Heme/Allergies: Negative.   Psychiatric/Behavioral: Positive for depression and hallucinations. Negative for suicidal ideas, memory loss and substance abuse. The patient is nervous/anxious and has insomnia.     Blood pressure 145/102, pulse 92, temperature 98.2 F (36.8 C), temperature source Oral, resp. rate 18, height '6\' 3"'  (1.905 m), weight 95.255 kg (210 lb), SpO2 99.00%.Body mass index is 26.25 kg/(m^2).  General Appearance: Disheveled  Eye Contact::  Minimal  Speech:  Pressured  Volume:  Decreased  Mood:  Anxious  Affect:  Constricted  Thought Process:  Circumstantial and Disorganized  Orientation:  Full (Time, Place, and Person)  Thought Content:  Delusions  Suicidal Thoughts:  No  Homicidal Thoughts:  No  Memory:  Immediate;   Fair Recent;   Fair Remote;   Fair  Judgement:  Impaired  Insight:  Lacking  Psychomotor Activity:  Decreased  Concentration:  Fair  Recall:  [place]: Fair  Akathisia:  No  Handed:  Right  AIMS (if indicated):     Assets:  Communication Skills Desire for Improvement Physical Health  Sleep:  Number of Hours: 3   Musculoskeletal: Strength & Muscle Tone: within normal limits Gait & Station: normal  Patient leans: N/A  Past Psychiatric History:Yes Diagnosis:Schizoaffective  Hospitalizations:[facility], [place], [facility]  Outpatient Care:Dr. Adele Schilder  Substance Abuse Care:Denies   Self-Mutilation:Denies  Suicidal Attempts:Denies but information in epic indicates at least two past suicide attempts per cutting neck and wrist.   Violent Behaviors:Denies   Past Medical History:   Past Medical History  Diagnosis Date  . Hyperlipidemia   . Schizoaffective disorder   . H/O: GI bleed   . Seizures   . Depression   . Chronic kidney disease   . Anemia 01/14/2014  . Anxiety attack    Seizure History:  Patient is on Keppra for seizure disorder.  and is followed by a Neurologist. Allergies:   Allergies  Allergen Reactions  . Risperidone And Related Other (See Comments)    Pt. States head feels like a rock   PTA Medications: Prescriptions prior to admission  Medication Sig Dispense Refill  . aspirin 81 MG chewable tablet Chew 1 tablet (81 mg total) by mouth daily.      Marland Kitchen atorvastatin (LIPITOR) 20 MG tablet Take 1 tablet (20 mg total) by mouth at bedtime.  30 tablet  1  . benztropine (COGENTIN) 2 MG tablet Take 1 tablet (2 mg total) by mouth at bedtime.  30 tablet  1  . cloZAPine (CLOZARIL) 100 MG tablet Take 1 tablet (100 mg total) by mouth 2 (two) times daily.  60 tablet  1  . clozapine (CLOZARIL) 200 MG tablet Take 2 tablets (400 mg total) by mouth at bedtime.  60 tablet  1  . folic acid (FOLVITE) 1 MG tablet Take 1 tablet (1 mg total) by mouth daily.  30 tablet  1  . hydrOXYzine (VISTARIL) 25 MG capsule Take 1 capsule (25 mg total) by mouth as needed for anxiety (MAXIMUM ONE DAILY).  30 capsule  0  . levETIRAcetam (KEPPRA XR) 500 MG 24 hr tablet Take 1 tablet (500 mg total) by mouth 2 (two) times daily.  60 tablet  12  . omeprazole (PRILOSEC) 40 MG capsule Take 1 capsule (40 mg total) by mouth daily.  30 capsule  1  . venlafaxine (EFFEXOR) 75 MG tablet Take 1 tablet (75 mg total) by mouth 2 (two) times daily with a meal.  60 tablet  1    Previous Psychotropic Medications:  Medication/Dose  Haldol, Lithium, Prolixin, Risperdal, Geodon, Seroquel                Substance Abuse History in the last 12 months:  no  Consequences of Substance Abuse: NA  Social History:  reports that he has never smoked. He has never used smokeless tobacco. He reports that he does not drink alcohol or use illicit drugs. Additional Social History: Pain Medications: Denies Prescriptions: Denies Over the Counter: Denies History of alcohol / drug use?: No history of alcohol / drug abuse                    Current Place of Residence:   Place of Birth:   Family Members: Marital Status:  Single Children:0  Sons:  Daughters: Relationships: Education:  Some college  Educational Problems/Performance: Religious Beliefs/Practices: History of Abuse (Emotional/Phsycial/Sexual) Occupational Experiences; Military History:  None. Legal History: Hobbies/Interests:  Family History:   Family History  Problem Relation Age of Onset  . Adopted: Yes  Patient is not aware of his family history due to being adopted.   Results for orders placed during the hospital encounter of 04/21/14 (from the past 72 hour(s))  GLUCOSE, CAPILLARY     Status: None   Collection Time    04/21/14  7:10 PM      Result Value Ref Range   Glucose-Capillary 89  70 - 99 mg/dL  CBC WITH DIFFERENTIAL     Status: Abnormal   Collection Time    04/21/14  8:10 PM      Result Value Ref Range   WBC 10.8 (*) 4.0 - 10.5 K/uL   RBC 3.94 (*) 4.22 - 5.81 MIL/uL   Hemoglobin 12.0 (*) 13.0 - 17.0 g/dL   HCT 35.4 (*) 39.0 - 52.0 %   MCV 89.8  78.0 - 100.0 fL   MCH 30.5  26.0 - 34.0 pg   MCHC 33.9  30.0 - 36.0 g/dL   RDW 12.5  11.5 - 15.5 %   Platelets 155  150 - 400 K/uL   Neutrophils Relative % 79 (*) 43 - 77 %   Neutro Abs 8.4 (*) 1.7 - 7.7 K/uL   Lymphocytes Relative 10 (*) 12 - 46 %   Lymphs Abs 1.1  0.7 - 4.0 K/uL   Monocytes Relative 10  3 - 12 %   Monocytes Absolute 1.1 (*) 0.1 - 1.0 K/uL   Eosinophils Relative 1  0 - 5 %   Eosinophils Absolute 0.1  0.0 - 0.7 K/uL   Basophils  Relative 0  0 - 1 %   Basophils Absolute 0.0  0.0 - 0.1 K/uL  COMPREHENSIVE METABOLIC PANEL     Status: Abnormal   Collection Time    04/21/14  8:10 PM      Result Value Ref Range   Sodium 145  137 - 147 mEq/L   Potassium 4.8  3.7 - 5.3 mEq/L   Chloride 107  96 - 112 mEq/L   CO2 23  19 - 32 mEq/L   Glucose, Bld 119 (*) 70 - 99 mg/dL   BUN 29 (*) 6 - 23 mg/dL   Creatinine, Ser 1.67 (*) 0.50 - 1.35 mg/dL   Calcium 9.4  8.4 - 10.5 mg/dL   Total Protein 7.6  6.0 - 8.3 g/dL   Albumin 4.4  3.5 - 5.2 g/dL   AST 24  0 - 37 U/L   Comment: SLIGHT HEMOLYSIS     HEMOLYSIS AT THIS LEVEL MAY AFFECT RESULT   ALT 13  0 - 53 U/L   Alkaline Phosphatase 144 (*) 39 - 117 U/L   Total Bilirubin 0.4  0.3 - 1.2 mg/dL   GFR calc non Af Amer 47 (*) >90 mL/min   GFR calc Af Amer 54 (*) >90 mL/min   Comment: (NOTE)     The eGFR has been calculated using the CKD EPI equation.     This calculation has not been validated in all clinical situations.     eGFR's persistently <90 mL/min signify possible Chronic Kidney     Disease.  ETHANOL     Status: None   Collection Time    04/21/14  8:10 PM      Result Value Ref Range   Alcohol, Ethyl (B) <11  0 - 11 mg/dL   Comment:            LOWEST DETECTABLE LIMIT FOR     SERUM ALCOHOL IS 11 mg/dL     FOR MEDICAL PURPOSES ONLY  URINE RAPID DRUG SCREEN (HOSP PERFORMED)     Status: None   Collection Time    04/21/14  9:01 PM  Result Value Ref Range   Opiates NONE DETECTED  NONE DETECTED   Cocaine NONE DETECTED  NONE DETECTED   Benzodiazepines NONE DETECTED  NONE DETECTED   Amphetamines NONE DETECTED  NONE DETECTED   Tetrahydrocannabinol NONE DETECTED  NONE DETECTED   Barbiturates NONE DETECTED  NONE DETECTED   Comment:            DRUG SCREEN FOR MEDICAL PURPOSES     ONLY.  IF CONFIRMATION IS NEEDED     FOR ANY PURPOSE, NOTIFY LAB     WITHIN 5 DAYS.                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 Hyperlipidemia   . Schizoaffective disorder   . H/O: GI bleed   . Seizures   . Depression   . Chronic kidney disease   . Anemia 01/14/2014  . Anxiety attack    AXIS IV:  housing problems, other psychosocial or environmental problems and problems related to social environment AXIS V:  31-40 impairment in reality testing  Treatment Plan/Recommendations:   1. Admit for crisis management and stabilization. Estimated length of stay 5-7 days. 2. Medication management to reduce current symptoms to base line and improve the patient's level of functioning.  3. Develop treatment plan to decrease risk of relapse upon discharge of depressive symptoms and the need for readmission. 5. Group therapy to facilitate development of healthy coping skills to use for depression, psychosis and anxiety. 6. Health care follow up as needed for medical problems.  7. Discharge plan to include therapy to help patient cope with  stressors.  8. Call for Consult with Hospitalist for additional specialty patient services as needed.   Treatment Plan Summary: Daily contact with patient to assess and evaluate symptoms and progress in treatment Medication management Current Medications:  Current Facility-Administered Medications  Medication Dose Route Frequency Provider Last Rate Last Dose  . acetaminophen (TYLENOL) tablet 650 mg  650 mg Oral Q6H PRN Benjamine Mola, FNP      . alum & mag hydroxide-simeth (MAALOX/MYLANTA) 200-200-20 MG/5ML suspension 30 mL  30 mL Oral Q4H PRN Benjamine Mola, FNP      . aspirin  chewable tablet 81 mg  81 mg Oral Daily Lurena Nida, NP   81 mg at 04/22/14 0831  . atorvastatin (LIPITOR) tablet 20 mg  20 mg Oral QHS Lurena Nida, NP      . benztropine (COGENTIN) tablet 2 mg  2 mg Oral QHS Lurena Nida, NP   2 mg  at 04/22/14 0046  . clonazePAM (KLONOPIN) tablet 0.5 mg  0.5 mg Oral BID Myosha Cuadras      . cloZAPine (CLOZARIL) tablet 100 mg  100 mg Oral BID Lurena Nida, NP   100 mg at 04/22/14 0831  . cloZAPine (CLOZARIL) tablet 400 mg  400 mg Oral QHS Lurena Nida, NP   400 mg at 04/22/14 0046  . [START ON 04/23/2014] FLUoxetine (PROZAC) capsule 20 mg  20 mg Oral Daily Anaston Koehn      . folic acid (FOLVITE) tablet 1 mg  1 mg Oral Daily Lurena Nida, NP   1 mg at 04/22/14 3545  . levETIRAcetam (KEPPRA XR) 24 hr tablet 500 mg  500 mg Oral BID Lurena Nida, NP   500 mg at 04/22/14 6256  . magnesium hydroxide (MILK OF MAGNESIA) suspension 30 mL  30 mL Oral Daily PRN Benjamine Mola, FNP      . pantoprazole (PROTONIX) EC tablet 80 mg  80 mg Oral Daily Lurena Nida, NP   80 mg at 04/22/14 0831    Observation Level/Precautions:  15 minute checks  Laboratory:  CBC Chemistry Profile UDS  Psychotherapy:  Individual and Group therapy  Medications:  Klonopin 0.5 mg BID for anxiety, Clozaril 100 mg BID and 400 mg at hs for psychosis, Keppra XR 500 mg BID for seizures, D/C Effexor due to elevated blood pressure, Start Prozac 20 mg daily for depression  Consultations:  As needed   Discharge Concerns:  Safety and Stability   Estimated LOS: 5-7 days   Other:     I certify that inpatient services furnished can reasonably be expected to improve the patient's condition.   Elmarie Shiley NP-C 5/22/20151:42 PM   Patient seen, evaluated and I agree with notes by Nurse Practitioner. Corena Pilgrim, MD

## 2014-04-22 NOTE — [facility] Counselor (Signed)
Adult Comprehensive Assessment  Patient ID: Brady Hoffman, male   DOB: 1966/02/05, 48 y.o.   MRN: [id number]  Information Source: Information source: Patient  Current Stressors:  Educational / Learning stressors: N/A Employment / Job issues: N/A  Disability Family Relationships: N/A Museum/gallery curator / Lack of resources (include bankruptcy): Yes  Fixed income-disability Housing / Lack of housing: N/A  Has lived in same group home for the past 12 years Physical health (include injuries & life threatening diseases): Yes  Was recently admitted to hospital after seizure.  Was on dialysis and life support in January. Social relationships: N/A Substance abuse: N/A Bereavement / Loss: N/A  Living/Environment/Situation:  Living Arrangements: Other (Comment) Living conditions (as described by patient or guardian): [facility]  Good  Been there 12 years How long has patient lived in current situation?: See above What is atmosphere in current home: Comfortable;Supportive  Family History:  Marital status: Single Does patient have children?: No  Childhood History:  By whom was/is the patient raised?: Father Additional childhood history information: Adopted at 3 weeks old Description of patient's relationship with caregiver when they were a child: Good Patient's description of current relationship with people who raised him/her: father died when he was 8   Good relationship with mother.  Visits her on weekends at her home. Does patient have siblings?: No Did patient suffer any verbal/emotional/physical/sexual abuse as a child?: No Did patient suffer from severe childhood neglect?: No Has patient ever been sexually abused/assaulted/raped as an adolescent or adult?: No Was the patient ever a victim of a crime or a disaster?: No Witnessed domestic violence?: No Has patient been effected by domestic violence as an adult?: No  Education:  Highest grade of school patient has completed: 12 Currently a  student?: No Contact person: Brady Hoffman (mother) [phone]; Brady Hoffman (group home staff) [phone] or [phone] Learning disability?: No  Employment/Work Situation:   Employment situation: On disability Why is patient on disability: mental health How long has patient been on disability: 15 years Patient's job has been impacted by current illness: No What is the longest time patient has a held a job?: N/A Where was the patient employed at that time?: N/A Has patient ever been in the [employer]?: No Has patient ever served in Recruitment consultant?: No  Financial Resources:   Museum/gallery curator resources: Teacher, early years/pre Does patient have a Programmer, applications or guardian?: No  Alcohol/Substance Abuse:   What has been your use of drugs/alcohol within the last 12 months?: Denies Alcohol/Substance Abuse Treatment Hx: Denies past history Has alcohol/substance abuse ever caused legal problems?: No  Social Support System:   Pensions consultant Support System: Good Describe Community Support System: Mother, Group Home  Leisure/Recreation:      Strengths/Needs:      Discharge Plan:   Does patient have access to transportation?: Yes Will patient be returning to same living situation after discharge?: Yes Currently receiving community mental health services: Yes (From Whom) ([facility]) Does patient have financial barriers related to discharge medications?: No  Summary/Recommendations:   Summary and Recommendations (to be completed by the evaluator): Brady Hoffman is a 48 YO Caucasian male with a persistent and significant mental illness.  Was hospitalized at [facility] for 18 mos 15 years ago, and has lived in the same [facility] since then.  He was doing well until the beginning of the year when he became more anxious,and exhibited some repetitive motions.  He end up being hospitalized in January for an unknown illness that required life supoort.  He recovered and returned to the group home, but his anxiety  and panic attacks, as well as the repetitive motions, have increased to the point that thye became unbearable. He can benefit from crises stabilization, medication managment, therapeutic milieuy and referral for services.  Brady Hoffman. 04/22/2014

## 2014-04-22 NOTE — [facility] Group Notes (Signed)
[facility] LCSW Aftercare Discharge Planning Group Note   04/22/2014 10:15 AM  Participation Quality:  Did not attend    Trish Mage

## 2014-04-22 NOTE — Tx Team (Signed)
Initial Interdisciplinary Treatment Plan  PATIENT STRENGTHS: (choose at least two) General fund of knowledge Supportive family/friends  PATIENT STRESSORS: Medication change or noncompliance   PROBLEM LIST: Problem List/Patient Goals Date to be addressed Date deferred Reason deferred Estimated date of resolution  Psychosis                                                        DISCHARGE CRITERIA:  Improved stabilization in mood, thinking, and/or behavior Motivation to continue treatment in a less acute level of care Verbal commitment to aftercare and medication compliance  PRELIMINARY DISCHARGE PLAN: Attend aftercare/continuing care group Return to previous living arrangement  PATIENT/FAMIILY INVOLVEMENT: This treatment plan has been presented to and reviewed with the patient, Ferd Horrigan, and/or family member, .  The patient and family have been given the opportunity to ask questions and make suggestions.  Migdalia Dk 04/22/2014, 12:34 AM

## 2014-04-23 DIAGNOSIS — N179 Acute kidney failure, unspecified: Secondary | ICD-10-CM

## 2014-04-23 DIAGNOSIS — G9340 Encephalopathy, unspecified: Secondary | ICD-10-CM

## 2014-04-23 DIAGNOSIS — F411 Generalized anxiety disorder: Secondary | ICD-10-CM

## 2014-04-23 DIAGNOSIS — R748 Abnormal levels of other serum enzymes: Secondary | ICD-10-CM

## 2014-04-23 DIAGNOSIS — N183 Chronic kidney disease, stage 3 unspecified: Secondary | ICD-10-CM | POA: Diagnosis present

## 2014-04-23 DIAGNOSIS — G40909 Epilepsy, unspecified, not intractable, without status epilepticus: Secondary | ICD-10-CM | POA: Diagnosis present

## 2014-04-23 LAB — LACTATE DEHYDROGENASE: LDH: 179 U/L (ref 94–250)

## 2014-04-23 LAB — MAGNESIUM: Magnesium: 1.9 mg/dL (ref 1.5–2.5)

## 2014-04-23 NOTE — [facility] Group Notes (Signed)
[place] Group Notes: (Clinical Social Work)   04/23/2014      Type of Therapy:  Group Therapy   Participation Level:  Did Not Attend    Selmer Dominion, LCSW 04/23/2014, 12:47 PM

## 2014-04-23 NOTE — Progress Notes (Signed)
Patient ID: Brady Hoffman, male   DOB: Mar 20, 1966, 48 y.o.   MRN: [id number] Psychoeducational Group Note  Date:  04/23/2014 Time:  0930  Group Topic/Focus:  health coping skills.   Participation Level: Did Not Attend  Participation Quality:  Not Applicable  Affect:  Not Applicable  Cognitive:  Not Applicable  Insight:  Not Applicable  Engagement in Group: Not Applicable  Additional Comments:  Did not attend.   [name] 04/23/2014, 10:02 AM

## 2014-04-23 NOTE — Progress Notes (Signed)
Patient ID: Brady Hoffman, male   DOB: Sep 14, 1966, 48 y.o.   MRN: [id number] D. The patient isolated in his room, resting in bed with eyes closed. He had an anxious mood and affect. Poor hygiene and body odor noted. A. Attempt made to awaken patient for evening group. Awaken and escorted to phlebotomy room to have blood draw. V.S taken. Awaken for HS medication. R. The patient did not attend evening wrap up group. He was compliant and able to walk escorted by staff to the phlebotomy room to have his blood drawn. Returned to the [employer] and retired back to bed. The patient remains afebrile with a temp of 98.1 BP 103/68 P 62 . Compliant with medication.

## 2014-04-23 NOTE — Progress Notes (Signed)
[facility] MD Progress Note  04/23/2014 12:45 PM Brady Hoffman  MRN:  [id number] Subjective:   Patient states "I am still very sleepy. But not quite as anxious. Maybe the new medication is helping. I am also not as sweaty."  Objective:  The patient was assessed in his room today as he has not been attending groups. He reports ongoing symptoms of panic attacks, depression, and episodes of feeling "sweaty." Patient also complains of fatigue. He is complaint with medications. Baptiste was started on Klonopin yesterday to help with his panic symptoms and also Prozac for depression. The patient's affect is noted to be very flat. Nursing staff report that he is isolative to his room. Patient is not going down to the cafeteria for meals and appears very withdrawn. He was encouraged to attend groups by writer but continues to not attend.   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  Hyperlipidemia    .  Schizoaffective disorder    .  H/O: GI bleed    .  Seizures    .  Depression    .  Chronic kidney disease    .  Anemia  01/14/2014   .  Anxiety attack     AXIS IV: housing problems, other psychosocial or environmental problems and problems related to social environment  AXIS V: 31-40 impairment in reality testing  ADL's:  Impaired  Sleep: Fair  Appetite:  Fair  Suicidal Ideation:  Denies Homicidal Ideation:  Denies AEB (as evidenced by):  Psychiatric Specialty Exam: Physical Exam  Review of Systems  Constitutional: Positive for malaise/fatigue and diaphoresis.  HENT: Negative.   Eyes: Negative.   Respiratory: Negative.   Cardiovascular: Negative.   Gastrointestinal: Negative.   Genitourinary: Negative.   Musculoskeletal: Negative.   Skin: Negative.   Neurological: Negative.    Endo/Heme/Allergies: Negative.   Psychiatric/Behavioral: Positive for depression. The patient is nervous/anxious.     Blood pressure 102/71, pulse 104, temperature 97.7 F (36.5 C), temperature source Oral, resp. rate 18, height _0  (1.905 m), weight 95.255 kg (210 lb), SpO2 99.00%.Body mass index is 26.25 kg/(m^2).  General Appearance: Disheveled and Body odor   Eye Contact::  Minimal  Speech:  Garbled and Slow  Volume:  Decreased  Mood:  Anxious  Affect:  Constricted  Thought Process:  Circumstantial and Disorganized  Orientation:  Full (Time, Place, and Person)  Thought Content:  Delusions  Suicidal Thoughts:  No  Homicidal Thoughts:  No  Memory:  Immediate;   Fair Recent;   Poor Remote;   Poor  Judgement:  Impaired  Insight:  Lacking  Psychomotor Activity:  Decreased  Concentration:  Fair  Recall:  [place]: Fair  Akathisia:  No  Handed:  Right  AIMS (if indicated):     Assets:  Communication Skills Desire for Improvement Physical Health  Sleep:  Number of Hours: 6.75   Musculoskeletal: Strength & Muscle Tone: within normal limits Gait & Station: normal Patient leans: N/A  Current Medications: Current Facility-Administered Medications  Medication Dose Route Frequency Provider Last Rate Last Dose  . acetaminophen (TYLENOL) tablet 650 mg  650 mg Oral Q6H PRN Benjamine Mola, FNP      . alum & mag hydroxide-simeth (MAALOX/MYLANTA) 200-200-20 MG/5ML suspension 30 mL  30 mL Oral Q4H PRN Benjamine Mola, FNP      . aspirin chewable tablet 81 mg  81 mg Oral Daily Lurena Nida, NP   81 mg at 04/23/14 0800  . atorvastatin (LIPITOR) tablet 20 mg  20 mg Oral QHS Lurena Nida, NP   20 mg at 04/22/14 2121  . benztropine (COGENTIN) tablet 2 mg  2 mg Oral QHS Lurena Nida, NP   2 mg at 04/22/14 2121  . clonazePAM (KLONOPIN) tablet 0.5 mg  0.5 mg Oral BID Mojeed Akintayo   0.5 mg at 04/23/14 0800  . cloZAPine (CLOZARIL) tablet 100 mg  100 mg Oral  BID Lurena Nida, NP   100 mg at 04/23/14 0800  . cloZAPine (CLOZARIL) tablet 400 mg  400 mg Oral QHS Lurena Nida, NP   400 mg at 04/22/14 2121  . FLUoxetine (PROZAC) capsule 20 mg  20 mg Oral Daily Mojeed Akintayo   20 mg at 04/23/14 0800  . folic acid (FOLVITE) tablet 1 mg  1 mg Oral Daily Lurena Nida, NP   1 mg at 04/23/14 0800  . levETIRAcetam (KEPPRA XR) 24 hr tablet 500 mg  500 mg Oral BID Lurena Nida, NP   500 mg at 04/23/14 0800  . magnesium hydroxide (MILK OF MAGNESIA) suspension 30 mL  30 mL Oral Daily PRN Benjamine Mola, FNP      . pantoprazole (PROTONIX) EC tablet 80 mg  80 mg Oral Daily Lurena Nida, NP   80 mg at 04/23/14 0800    Lab Results:  Results for orders placed during the hospital encounter of 04/21/14 (from the past 48 hour(s))  GLUCOSE, CAPILLARY     Status: None   Collection Time    04/21/14  7:10 PM      Result Value Ref Range   Glucose-Capillary 89  70 - 99 mg/dL  CBC WITH DIFFERENTIAL     Status: Abnormal   Collection Time    04/21/14  8:10 PM      Result Value Ref Range   WBC 10.8 (*) 4.0 - 10.5 K/uL   RBC 3.94 (*) 4.22 - 5.81 MIL/uL   Hemoglobin 12.0 (*) 13.0 - 17.0 g/dL   HCT 35.4 (*) 39.0 - 52.0 %   MCV 89.8  78.0 - 100.0 fL   MCH 30.5  26.0 - 34.0 pg   MCHC 33.9  30.0 - 36.0 g/dL   RDW 12.5  11.5 - 15.5 %   Platelets 155  150 - 400 K/uL   Neutrophils Relative % 79 (*) 43 - 77 %   Neutro Abs 8.4 (*) 1.7 - 7.7 K/uL   Lymphocytes Relative 10 (*) 12 - 46 %   Lymphs Abs 1.1  0.7 - 4.0 K/uL   Monocytes Relative 10  3 - 12 %   Monocytes Absolute 1.1 (*) 0.1 - 1.0 K/uL   Eosinophils Relative 1  0 - 5 %   Eosinophils Absolute 0.1  0.0 - 0.7 K/uL   Basophils Relative 0  0 - 1 %   Basophils Absolute 0.0  0.0 - 0.1 K/uL  COMPREHENSIVE METABOLIC PANEL     Status: Abnormal   Collection Time    04/21/14  8:10 PM      Result Value Ref Range   Sodium 145  137 - 147 mEq/L   Potassium 4.8  3.7 - 5.3 mEq/L   Chloride 107  96 - 112 mEq/L   CO2  23  19  - 32 mEq/L   Glucose, Bld 119 (*) 70 - 99 mg/dL   BUN 29 (*) 6 - 23 mg/dL   Creatinine, Ser 1.67 (*) 0.50 - 1.35 mg/dL   Calcium 9.4  8.4 - 10.5 mg/dL   Total Protein 7.6  6.0 - 8.3 g/dL   Albumin 4.4  3.5 - 5.2 g/dL   AST 24  0 - 37 U/L   Comment: SLIGHT HEMOLYSIS     HEMOLYSIS AT THIS LEVEL MAY AFFECT RESULT   ALT 13  0 - 53 U/L   Alkaline Phosphatase 144 (*) 39 - 117 U/L   Total Bilirubin 0.4  0.3 - 1.2 mg/dL   GFR calc non Af Amer 47 (*) >90 mL/min   GFR calc Af Amer 54 (*) >90 mL/min   Comment: (NOTE)     The eGFR has been calculated using the CKD EPI equation.     This calculation has not been validated in all clinical situations.     eGFR's persistently <90 mL/min signify possible Chronic Kidney     Disease.  ETHANOL     Status: None   Collection Time    04/21/14  8:10 PM      Result Value Ref Range   Alcohol, Ethyl (B) <11  0 - 11 mg/dL   Comment:            LOWEST DETECTABLE LIMIT FOR     SERUM ALCOHOL IS 11 mg/dL     FOR MEDICAL PURPOSES ONLY  URINE RAPID DRUG SCREEN (HOSP PERFORMED)     Status: None   Collection Time    04/21/14  9:01 PM      Result Value Ref Range   Opiates NONE DETECTED  NONE DETECTED   Cocaine NONE DETECTED  NONE DETECTED   Benzodiazepines NONE DETECTED  NONE DETECTED   Amphetamines NONE DETECTED  NONE DETECTED   Tetrahydrocannabinol NONE DETECTED  NONE DETECTED   Barbiturates NONE DETECTED  NONE DETECTED   Comment:            DRUG SCREEN FOR MEDICAL PURPOSES     ONLY.  IF CONFIRMATION IS NEEDED     FOR ANY PURPOSE, NOTIFY LAB     WITHIN 5 DAYS.                LOWEST DETECTABLE LIMITS     FOR URINE DRUG SCREEN     Drug Class       Cutoff (ng/mL)     Amphetamine      1000     Barbiturate      200     Benzodiazepine   518     Tricyclics       841     Opiates          300     Cocaine          300     THC              50  CK     Status: Abnormal   Collection Time    04/22/14  8:22 PM      Result Value Ref Range   Total CK 438 (*) 7  - 232 U/L   Comment: Performed at [facility]    Physical Findings: AIMS: Facial and Oral Movements Muscles of Facial Expression: Mild Lips and Perioral Area: Mild Jaw: Mild Tongue: Mild,Extremity Movements Upper (arms, wrists, hands, fingers): Minimal Lower (legs,  knees, ankles, toes): None, normal, Trunk Movements Neck, shoulders, hips: Minimal, Overall Severity Severity of abnormal movements (highest score from questions above): Mild Incapacitation due to abnormal movements: Minimal Patient's awareness of abnormal movements (rate only patient's report): Aware, mild distress, Dental Status Current problems with teeth and/or dentures?: No Does patient usually wear dentures?: No  CIWA:    COWS:     Treatment Plan Summary: Daily contact with patient to assess and evaluate symptoms and progress in treatment Medication management  Plan: 1. Continue crisis management and stabilization.  2. Medication management: Continue Klonopin 0.5 mg BID for anxiety, Clozaril as ordered, Prozac 20 mg daily for depression.  3. Encouraged patient to attend groups and participate in group counseling sessions and activities.  4. Discharge plan in progress.  5. Continue current treatment plan.  6. Address medical issues: Vitals stable. Continue Keppra for seizure management. Medical consult for elevated CK and lethargy.   Medical Decision Making Problem Points:  Established problem, worsening (2), Review of last therapy session (1) and Review of psycho-social stressors (1) Data Points:  Decision to obtain old records (1) Order Aims Assessment (2) Review or order clinical lab tests (1) Review or order medicine tests (1) Review and summation of old records (2) Review of medication regiment & side effects (2) Review of new medications or change in dosage (2)  I certify that inpatient services furnished can reasonably be expected to improve the patient's condition.   Elmarie Shiley  NP-C 04/23/2014, 12:45 PM Agree with assessment and plan Woodroe Chen. Sabra Heck, M.D.

## 2014-04-23 NOTE — Progress Notes (Signed)
Patient ID: Brady Hoffman, male   DOB: [birth year], 48 y.o.   MRN: [id number] Psychoeducational Group Note  Date:  04/23/2014 Time:  0910  Group Topic/Focus:  inventory group   Participation Level: Did Not Attend  Participation Quality:  Not Applicable  Affect:  Not Applicable  Cognitive:  Not Applicable  Insight:  Not Applicable  Engagement in Group: Not Applicable  Additional Comments:  Did not attend.   [name] 04/23/2014, 10:01 AM

## 2014-04-23 NOTE — Progress Notes (Signed)
Pt anxious in affect this evening with congruent mood however reports feeling better overall. No episodes of sweating noted. He forwards little information and remains withdrawn. Disheveled with body odor noted. Pt offered support and reassurance. Medicated per orders without difficulty. VS obtained and pt afebrile however pulse is elevated at 101 while sitting and 103 upon standing. Denies pain, physical problems. Denies SI/HI/AVH and remains safe at this time. Junius Creamer Tommi Rumps

## 2014-04-23 NOTE — Progress Notes (Signed)
Patient ID: Brady Hoffman, male   DOB: [birth year], 48 y.o.   MRN: [id number] 04-23-14 nursing shift note: D: pt has been in the bed most of the day. RN spoke with the parent of the patient during lunch time. Pt has had minimal interaction on the unit and minimal interaction with the RN. pt has had some sweating episodes and has been very anxious and restless prior to admission, stated his mother when she visited. A: staff continues to encourage and support this pt. Extender spoke with a hospitalist to come and assess the patient. New orders were obtained.  R: on his inventory sheet he wrote: slept well, appetite good, energy low, attention improving with his depression and hopelessness both at 0. No pain and no physical problems. RN will continue to monitor and Q 15 min ck's continue.

## 2014-04-23 NOTE — Progress Notes (Signed)
Pt did not attend group this afternoon.   

## 2014-04-23 NOTE — Consult Note (Addendum)
Consult NotePrevin Hoffman STM:[id number] DOB: [birth year] DOA: 04/21/2014 PCP: Philis Fendt, MD  Requesting provider: Elmarie Shiley, NP  Reason for consultation: Evaluate elevated CK, question neuroleptic malignant syndrome  History of Present Illness:   The patient is a 48 year old man with PMH of seizure disorder on Keppra, dyslipidemia on Lipitor, panic attacks/schizoaffective disorder/depression  managed with Clozaril and Effexor who was admitted to [facility] on 04/22/14 with a chief complaint of worsening panic attacks, mood swings and some psychosis including paranoia and auditory hallucinations. The patient is a resident of a group home. Upon reevaluation today by the psychiatric team, there was some concern for his mental status (noted to be with flat affect, self isolating in his room, withdrawn, and with fatigue).  A CK level done yesterday was 438, and there was some concern that the patient was experiencing neuroleptic malignant syndrome, so internal medicine was called to evaluate.  The patient tells me he was started on Lipitor 2/15.  He has not had any recent seizures.  He feels a bit weak, but no myalgias.  No palpitations or weight loss.    Allergies:     Allergies  Allergen Reactions  . Risperidone And Related Other (See Comments)    Pt. States head feels like a rock    Past Medical History:     Past Medical History  Diagnosis Date  . Hyperlipidemia   . Schizoaffective disorder   . H/O: GI bleed   . Seizures   . Depression   . Chronic kidney disease   . Anemia 01/14/2014  . Anxiety attack     Past Surgical History:   Past Surgical History  Procedure Laterality Date  . Mole removal    . Wisdom tooth extraction    . Esophagogastroduodenoscopy  11/01/2011    Procedure: ESOPHAGOGASTRODUODENOSCOPY (EGD);  Surgeon: Beryle Beams;  Location: [facility] ENDOSCOPY;  Service: Endoscopy;  Laterality: N/A;    Current Medications:   Scheduled Meds: . aspirin  81  mg Oral Daily  . benztropine  2 mg Oral QHS  . clonazePAM  0.5 mg Oral BID  . cloZAPine  100 mg Oral BID  . clozapine  400 mg Oral QHS  . FLUoxetine  20 mg Oral Daily  . folic acid  1 mg Oral Daily  . levETIRAcetam  500 mg Oral BID  . pantoprazole  80 mg Oral Daily   Continuous Infusions:  PRN Meds:.acetaminophen, alum & mag hydroxide-simeth, magnesium hydroxide  Social History:    reports that he has never smoked. He has never used smokeless tobacco. He reports that he does not drink alcohol or use illicit drugs.  Family History:   Family History  Problem Relation Age of Onset  . Adopted: Yes    Review of Systems:   Constitutional: No fever, no chills;  Appetite normal; No weight loss, no weight gain, + fatigue.  HEENT: No blurry vision, no diplopia, no pharyngitis, no dysphagia CV: No chest pain, no palpitations, no PND, no orthopnea, no edema.  Resp: No SOB, no cough, no pleuritic pain. GI: No nausea, no vomiting, no diarrhea, no melena, no hematochezia, no constipation, no abdominal pain.  GU: No dysuria, no hematuria, no frequency, no urgency. MSK: no myalgias, no arthralgias.  Neuro:  No headache, no focal neurological deficits, + history of seizures.  Psych: + depression, + anxiety.  Endo: No heat intolerance, no cold intolerance, no polyuria, no polydipsia  Skin: No rashes, no skin lesions.  Heme:  No easy bruising.    Physical Exam:    Filed Vitals:   04/22/14 1800 04/22/14 2049 04/23/14 0600 04/23/14 1214  BP: 126/84 103/68 113/74 102/71  Pulse: 105 62 97 104  Temp: 97.8 F (36.6 C) 98.1 F (36.7 C) 97.2 F (36.2 C) 97.7 F (36.5 C)  TempSrc: Oral Oral  Oral  Resp: 18 18 18    Height:      Weight:      SpO2: 99%   99%    No intake or output data in the 24 hours ending 04/23/14 1644  General: Alert, awake, oriented x3, in no acute distress.  Non-toxic appearing HEENT: Normocephalic, atraumatic. Heart: Regular rate and rhythm, without murmurs, rubs,  gallops. Lungs: Clear to auscultation bilaterally. Abdomen: Soft, nontender, nondistended, positive bowel sounds. Extremities: No clubbing cyanosis or edema with positive pedal pulses. Neuro: Grossly intact, nonfocal. Musculoskeletal: No lead pipe rigidity or cogwheeling.  Data Review:   Labs:  CBC:    Component Value Date/Time   WBC 10.8* 04/21/2014 2010   HGB 12.0* 04/21/2014 2010   HCT 35.4* 04/21/2014 2010   PLT 155 04/21/2014 2010   MCV 89.8 04/21/2014 2010   NEUTROABS 8.4* 04/21/2014 2010   LYMPHSABS 1.1 04/21/2014 2010   MONOABS 1.1* 04/21/2014 2010   EOSABS 0.1 04/21/2014 2010   BASOSABS 0.0 04/21/2014 3419    Basic Metabolic Panel:    Component Value Date/Time   NA 145 04/21/2014 2010   K 4.8 04/21/2014 2010   CL 107 04/21/2014 2010   CO2 23 04/21/2014 2010   BUN 29* 04/21/2014 2010   CREATININE 1.67* 04/21/2014 2010   CREATININE 1.36* 02/25/2014 1109   GLUCOSE 119* 04/21/2014 2010   CALCIUM 9.4 04/21/2014 2010    Radiological Exams: No results found.  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 The syndrome is also associated with fever and significant mental status changes (the patient's mental status appears relatively normal on my exam). There is also no lead pipe rigidity or cogwheeling.  The patient's CK levels may be elevated as a side effect of his statin therapy, and therefore we will stop his statin with recommendations to repeat his CK levels in 48 hours. To further elucidate whether he may have a hypermetabolic state, will check LDH, TSH, and a comprehensive metabolic panel to evaluate liver function and electrolytes. Patient's urine drug  screen was negative for illicit substances so cocaine abuse ruled out, and he has not had a seizure recently so CK elevation not secondary to seizure event.  Although the patient has some renal impairment, it appears that he has underlying stage III CKD with creatinines as high as 2.4-2.6 (current creatinine 1.6).  Thank you for this consultation.  We will follow the patient with you.  Time Spent on Consult: 1 hour.  Venetia Maxon Trenita Hulme 04/23/2014, 4:44 PM

## 2014-04-23 NOTE — Progress Notes (Signed)
Psychoeducational Group Note  Date:  04/23/2014 Time:  8:00 p.m.   Group Topic/Focus:  Wrap-Up Group:   The focus of this group is to help patients review their daily goal of treatment and discuss progress on daily workbooks.  Participation Level: Did Not Attend  Participation Quality:  Not Applicable  Affect:  Not Applicable  Cognitive:  Not Applicable  Insight:  Not Applicable  Engagement in Group: Not Applicable  Additional Comments:  The patient didn't attend group this evening since he remained in his bedroom.   Gennette Pac 04/23/2014, 9:54 PM

## 2014-04-24 LAB — COMPREHENSIVE METABOLIC PANEL
ALBUMIN: 3.5 g/dL (ref 3.5–5.2)
ALT: 11 U/L (ref 0–53)
AST: 13 U/L (ref 0–37)
Alkaline Phosphatase: 111 U/L (ref 39–117)
BUN: 16 mg/dL (ref 6–23)
CALCIUM: 8.7 mg/dL (ref 8.4–10.5)
CO2: 26 mEq/L (ref 19–32)
Chloride: 105 mEq/L (ref 96–112)
Creatinine, Ser: 1.24 mg/dL (ref 0.50–1.35)
GFR calc Af Amer: 78 mL/min — ABNORMAL LOW (ref >90)
GFR, EST NON AFRICAN AMERICAN: 67 mL/min — AB (ref >90)
Glucose, Bld: 100 mg/dL — ABNORMAL HIGH (ref 70–99)
Potassium: 4.3 mEq/L (ref 3.7–5.3)
SODIUM: 143 meq/L (ref 137–147)
Total Bilirubin: 0.2 mg/dL — ABNORMAL LOW (ref 0.3–1.2)
Total Protein: 6.6 g/dL (ref 6.0–8.3)

## 2014-04-24 LAB — TSH: TSH: 17.42 u[IU]/mL — AB (ref 0.350–4.500)

## 2014-04-24 MED ORDER — PANTOPRAZOLE SODIUM 40 MG PO TBEC
80.0000 mg | DELAYED_RELEASE_TABLET | Freq: Every day | ORAL | Status: DC
  Administered 2014-04-25 – 2014-05-04 (×10): 80 mg via ORAL
  Filled 2014-04-24 (×12): qty 2

## 2014-04-24 MED ORDER — LEVOTHYROXINE SODIUM 50 MCG PO TABS
50.0000 ug | ORAL_TABLET | Freq: Every day | ORAL | Status: DC
  Administered 2014-04-25 – 2014-05-04 (×10): 50 ug via ORAL
  Filled 2014-04-24: qty 1
  Filled 2014-04-24: qty 2
  Filled 2014-04-24 (×11): qty 1

## 2014-04-24 NOTE — Progress Notes (Signed)
Patient ID: Quantavious Eggert, male   DOB: 09-25-1966, 48 y.o.   MRN: [id number] Psychoeducational Group Note  Date:  04/24/2014 Time:  0910  Group Topic/Focus:  inventory group   Participation Level: Did Not Attend  Participation Quality:  Not Applicable  Affect:  Not Applicable  Cognitive:  Not Applicable  Insight:  Not Applicable  Engagement in Group: Not Applicable  Additional Comments:  Did not attend.   [name] 04/24/2014, 10:04 AM

## 2014-04-24 NOTE — Progress Notes (Signed)
Patient ID: Brady Hoffman, male   DOB: [birth year], 48 y.o.   MRN: [id number] 04-24-2014 nursing shift note: D: pt came to medication this am for am medication. He is not going to groups. He denied any si/hi/av. A: staff continue to encourage and support him. He is coming to the nurse asking for Gatorade. R: on his inventory sheet he wrote: slept well, appetite good, energy low, attention improving with his depression at 2 and his hopelessness at 1. No pain or withdrawal symptoms. Physical problems in the last 24 hours have been dizziness. After discharge he plans to "eat regularly". His TSH was resulted and the value was 17.420 high (0.350-4.500)  . He is being followed by a hospitalist and the extender in this facility was made aware of this value. RN will monitor and Q 15 min ck's continue.

## 2014-04-24 NOTE — Progress Notes (Signed)
PROGRESS NOTE  Brady Hoffman QVZ:[id number] DOB: [birth year] DOA: 04/21/2014 PCP: Philis Fendt, MD  HPI: The patient is a 48 year old man with PMH of seizure disorder on Keppra, dyslipidemia on Lipitor, panic attacks/schizoaffective disorder/depression managed with Clozaril and Effexor who was admitted to [facility] on 04/22/14 with a chief complaint of worsening panic attacks, mood swings and some psychosis including paranoia and auditory hallucinations. The patient is a resident of a group home. Upon reevaluation today by the psychiatric team, there was some concern for his mental status (noted to be with flat affect, self isolating in his room, withdrawn, and with fatigue). A CK level done yesterday was 438, and there was some concern that the patient was experiencing neuroleptic malignant syndrome, so internal medicine was called to evaluate. The patient tells me he was started on Lipitor 2/15. He has not had any recent seizures. He feels a bit weak, but no myalgias. No palpitations or weight loss.   Assessment/Plan:  Hypothyroidism - symptomatic, start synthroid in am, 50 mcg. I discussed with the patient but please reinforce on discharge instructions to be taken half an hour before breakfast, by itself without any other medications and to avoid antiactid medications or calcium supplements for about 4 hours after Synthroid is administered. I changed his Protonix to prior to lunch starting tomorrow.  - He will need repeat TSH in 4-6 weeks as an outpatient.   Seizure disorder - continue Keppra  HLD - stop statin, recheck CK in am  Schizoaffective disorder - per primary team.   Elevated CK - likely due to statin, hypothyroidism technically possible but unlikely.    I will follow CK tomorrow and if no significant abnormalities I will sign off. Please don't hesitate to call with additional questions.    Antibiotics - none   HPI/Subjective: Felling well, fatigued. Endorses constipation.     Objective: Filed Vitals:   04/23/14 2056 04/23/14 2152 04/24/14 0550 04/24/14 0551  BP: 107/72  112/81 98/69  Pulse: 103  97 103  Temp:  97 F (36.1 C) 96.6 F (35.9 C)   TempSrc:  Oral Oral   Resp:   16   Height:      Weight:      SpO2:       No intake or output data in the 24 hours ending 04/24/14 1627 Filed Weights   04/21/14 1956  Weight: 95.255 kg (210 lb)   Exam:  General:  NAD  Cardiovascular: regular rate and rhythm, without MRG  Respiratory: good air movement, clear to auscultation throughout, no wheezing, ronchi or rales  Abdomen: soft, not tender to palpation, positive bowel sounds  MSK: no peripheral edema  Neuro: non focal, DTRs 1+  Data Reviewed: Basic Metabolic Panel:  Recent Labs Lab 04/21/14 2010 04/23/14 1920 04/24/14 0645  NA 145  --  143  K 4.8  --  4.3  CL 107  --  105  CO2 23  --  26  GLUCOSE 119*  --  100*  BUN 29*  --  16  CREATININE 1.67*  --  1.24  CALCIUM 9.4  --  8.7  MG  --  1.9  --    Liver Function Tests:  Recent Labs Lab 04/21/14 2010 04/24/14 0645  AST 24 13  ALT 13 11  ALKPHOS 144* 111  BILITOT 0.4 0.2*  PROT 7.6 6.6  ALBUMIN 4.4 3.5   CBC:  Recent Labs Lab 04/21/14 1532 04/21/14 2010  WBC 13.4* 10.8*  NEUTROABS 10.3* 8.4*  HGB 12.6* 12.0*  HCT 36.1* 35.4*  MCV 89.1 89.8  PLT 178 155   Cardiac Enzymes:  Recent Labs Lab 04/22/14 2022  CKTOTAL 438*   CBG:  Recent Labs Lab 04/21/14 1910  GLUCAP 89   Studies: No results found.  Scheduled Meds: . aspirin  81 mg Oral Daily  . benztropine  2 mg Oral QHS  . clonazePAM  0.5 mg Oral BID  . cloZAPine  100 mg Oral BID  . clozapine  400 mg Oral QHS  . FLUoxetine  20 mg Oral Daily  . folic acid  1 mg Oral Daily  . levETIRAcetam  500 mg Oral BID  . 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 Any transcriptional errors are unintentional.   Marzetta Board, MD [facility] Pager [phone]. If 7 PM - 7 AM, please contact night-coverage at [email or web address], password [email or web address] 04/24/2014, 4:27 PM  LOS: 3 days

## 2014-04-24 NOTE — [facility] Group Notes (Signed)
[facility] Group Notes:  (Clinical Social Work)  04/24/2014   11:15am-12:00pm  Summary of Progress/Problems:  The main focus of today's process group was to listen to a variety of genres of music and to identify that different types of music provoke different responses.  The patient then was able to identify personally what was soothing for them, as well as energizing.  Handouts were used to record feelings evoked, as well as how patient can personally use this knowledge in sleep habits, with depression, and with other symptoms.  The patient expressed understanding of concepts, as well as knowledge of how each type of music affected him/her and how this can be used at home as a wellness/recovery tool.  He used a wide vocabulary to describe his feelings about the various kinds of music for the first 3/4 of group, but then seemed to get distracted by tossing his pencil over and over in his lap.  Type of Therapy:  Music Therapy   Participation Level:  Active  Participation Quality:  Attentive and Sharing  Affect:  Blunted,   Cognitive:  Alert  Insight:  Improving  Engagement in Therapy:  Improving  Modes of Intervention:   Activity, Exploration  Selmer Dominion, LCSW 04/24/2014, 12:30pm

## 2014-04-24 NOTE — Progress Notes (Signed)
Pt continues to forward little information on approach however is pleasant and cooperative. Med compliant and only request is for gatorade or other fluids. Does report light dizziness at times and therefore fall precautions were again reviewed and strongly encouraged. Support and reassurance offered as pt remains anxious in affect and mood. States he had a good visit with his mom and states that he does like his [place]. "I think it's the best thing for me." Pt verbalizes understanding of fall teaching. Denies SI/HI/AVH and remains safe. Brady Hoffman

## 2014-04-24 NOTE — Progress Notes (Signed)
Patient ID: Brady Hoffman, male   DOB: [birth year], 48 y.o.   MRN: [id number] [facility] MD Progress Note  04/24/2014 11:39 AM Heidi Lemay  MRN:  [id number] Subjective:   Patient states "My anxiety is getting better. I will get up for group."   Objective:  Patient assessed today in his room. He appeared to be asleep. Patient began to eat breakfast when prompted by Probation officer. He reports still being "a little anxious" but that he is not experiencing symptoms of psychosis at this time. After being prompted to attend groups the patient was later noted standing in the hallway. Arne is showing signs of slow progress as he was isolative to his room yesterday. Patient is compliant with his scheduled medications. His mother is regularly visiting him in the hospital. Patient was seen by Internal Medicine yesterday due to concerns for confusion and frequent diaphoresis. The patient appears to have some trouble processing information. He is pleasant and cooperative during interactions. Patient reports that he showered yesterday evening.   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  Hyperlipidemia    .  Schizoaffective disorder    .  H/O: GI bleed    .  Seizures    .  Depression    .  Chronic kidney disease    .  Anemia  01/14/2014   .  Anxiety attack     AXIS IV: housing problems, other psychosocial or environmental problems and problems related to social environment  AXIS V: 31-40 impairment in reality testing  ADL's:  Impaired  Sleep: Good  Appetite:  Fair  Suicidal Ideation:  Denies Homicidal Ideation:  Denies AEB (as evidenced by):  Psychiatric Specialty Exam: Physical Exam  Review of Systems  Constitutional: Negative.  Negative for malaise/fatigue and diaphoresis.  HENT: Negative.   Eyes:  Negative.   Respiratory: Negative.   Cardiovascular: Negative.   Gastrointestinal: Negative.   Genitourinary: Negative.   Musculoskeletal: Negative.   Skin: Negative.   Neurological: Positive for dizziness.  Endo/Heme/Allergies: Negative.   Psychiatric/Behavioral: Positive for depression. The patient is nervous/anxious.     Blood pressure 98/69, pulse 103, temperature 96.6 F (35.9 C), temperature source Oral, resp. rate 16, height '6\' 3"'  (1.905 m), weight 95.255 kg (210 lb), SpO2 99.00%.Body mass index is 26.25 kg/(m^2).  General Appearance: Disheveled and Body odor   Eye Contact::  Fair  Speech:  Garbled and Slow  Volume:  Decreased  Mood:  Anxious  Affect:  Constricted  Thought Process:  Circumstantial  Orientation:  Full (Time, Place, and Person)  Thought Content:  Worries, concerns   Suicidal Thoughts:  No  Homicidal Thoughts:  No  Memory:  Immediate;   Fair Recent;   Poor Remote;   Poor  Judgement:  Impaired  Insight:  Lacking  Psychomotor Activity:  Decreased  Concentration:  Fair  Recall:  [place]: Fair  Akathisia:  No  Handed:  Right  AIMS (if indicated):     Assets:  Communication Skills Desire for Improvement Physical Health  Sleep:  Number of Hours: 6.75   Musculoskeletal: Strength & Muscle Tone: within normal limits Gait & Station: normal Patient leans: N/A  Current Medications: Current Facility-Administered Medications  Medication Dose Route Frequency Provider Last Rate Last Dose  . acetaminophen (  TYLENOL) tablet 650 mg  650 mg Oral Q6H PRN Benjamine Mola, FNP      . alum & mag hydroxide-simeth (MAALOX/MYLANTA) 200-200-20 MG/5ML suspension 30 mL  30 mL Oral Q4H PRN Benjamine Mola, FNP      . aspirin chewable tablet 81 mg  81 mg Oral Daily Lurena Nida, NP   81 mg at 04/24/14 0748  . benztropine (COGENTIN) tablet 2 mg  2 mg Oral QHS Lurena Nida, NP   2 mg at 04/23/14 2104  . clonazePAM (KLONOPIN) tablet 0.5 mg  0.5 mg  Oral BID Mojeed Akintayo   0.5 mg at 04/24/14 0749  . cloZAPine (CLOZARIL) tablet 100 mg  100 mg Oral BID Lurena Nida, NP   100 mg at 04/24/14 0749  . cloZAPine (CLOZARIL) tablet 400 mg  400 mg Oral QHS Lurena Nida, NP   400 mg at 04/23/14 2104  . FLUoxetine (PROZAC) capsule 20 mg  20 mg Oral Daily Mojeed Akintayo   20 mg at 04/24/14 0749  . folic acid (FOLVITE) tablet 1 mg  1 mg Oral Daily Lurena Nida, NP   1 mg at 04/24/14 0749  . levETIRAcetam (KEPPRA XR) 24 hr tablet 500 mg  500 mg Oral BID Lurena Nida, NP   500 mg at 04/24/14 0749  . magnesium hydroxide (MILK OF MAGNESIA) suspension 30 mL  30 mL Oral Daily PRN Benjamine Mola, FNP      . pantoprazole (PROTONIX) EC tablet 80 mg  80 mg Oral Daily Lurena Nida, NP   80 mg at 04/24/14 8118    Lab Results:  Results for orders placed during the hospital encounter of 04/21/14 (from the past 48 hour(s))  CK     Status: Abnormal   Collection Time    04/22/14  8:22 PM      Result Value Ref Range   Total CK 438 (*) 7 - 232 U/L   Comment: Performed at [place]     Status: None   Collection Time    04/23/14  7:20 PM      Result Value Ref Range   LDH 179  94 - 250 U/L   Comment: Performed at [place]     Status: None   Collection Time    04/23/14  7:20 PM      Result Value Ref Range   Magnesium 1.9  1.5 - 2.5 mg/dL   Comment: Performed at [facility]  TSH     Status: Abnormal   Collection Time    04/23/14  7:20 PM      Result Value Ref Range   TSH 17.420 (*) 0.350 - 4.500 uIU/mL   Comment: Please note change in reference range.     Performed at [place] PANEL     Status: Abnormal   Collection Time    04/24/14  6:45 AM      Result Value Ref Range   Sodium 143  137 - 147 mEq/L   Potassium 4.3  3.7 - 5.3 mEq/L   Chloride 105  96 - 112 mEq/L   CO2 26  19 - 32 mEq/L   Glucose, Bld 100 (*) 70 - 99  mg/dL   BUN 16  6 - 23 mg/dL   Creatinine, Ser 1.24  0.50 - 1.35 mg/dL   Calcium 8.7  8.4 - 10.5 mg/dL  Total Protein 6.6  6.0 - 8.3 g/dL   Albumin 3.5  3.5 - 5.2 g/dL   AST 13  0 - 37 U/L   ALT 11  0 - 53 U/L   Alkaline Phosphatase 111  39 - 117 U/L   Total Bilirubin 0.2 (*) 0.3 - 1.2 mg/dL   GFR calc non Af Amer 67 (*) >90 mL/min   GFR calc Af Amer 78 (*) >90 mL/min   Comment: (NOTE)     The eGFR has been calculated using the CKD EPI equation.     This calculation has not been validated in all clinical situations.     eGFR's persistently <90 mL/min signify possible Chronic Kidney     Disease.     Performed at [facility]    Physical Findings: AIMS: Facial and Oral Movements Muscles of Facial Expression: Mild Lips and Perioral Area: Mild Jaw: Mild Tongue: Mild,Extremity Movements Upper (arms, wrists, hands, fingers): Minimal Lower (legs, knees, ankles, toes): None, normal, Trunk Movements Neck, shoulders, hips: Minimal, Overall Severity Severity of abnormal movements (highest score from questions above): Mild Incapacitation due to abnormal movements: Minimal Patient's awareness of abnormal movements (rate only patient's report): Aware, mild distress, Dental Status Current problems with teeth and/or dentures?: No Does patient usually wear dentures?: No  CIWA:    COWS:     Treatment Plan Summary: Daily contact with patient to assess and evaluate symptoms and progress in treatment Medication management  Plan: 1. Continue crisis management and stabilization.  2. Medication management: Continue Klonopin 0.5 mg BID for anxiety, Clozaril as ordered, Prozac 20 mg daily for depression.  3. Encouraged patient to attend groups and participate in group counseling sessions and activities.  4. Discharge plan in progress.  5. Continue current treatment plan.  6. Address medical issues: Vitals stable. Continue Keppra for seizure management. Internal medicine  following patient for abnormal lab values.   Medical Decision Making Problem Points:  Established problem, stable/improving (1), Review of last therapy session (1) and Review of psycho-social stressors (1) Data Points:  Order Aims Assessment (2) Review or order clinical lab tests (1) Review or order medicine tests (1) Review of medication regiment & side effects (2)  I certify that inpatient services furnished can reasonably be expected to improve the patient's condition.   Elmarie Shiley NP-C 04/24/2014, 11:39 AM Agree with assessment and plan Woodroe Chen. Sabra Heck, M.D.

## 2014-04-24 NOTE — Progress Notes (Signed)
Patient ID: Dane Kopke, male   DOB: 1966/08/03, 48 y.o.   MRN: [id number] Psychoeducational Group Note  Date:  04/24/2014 Time:  0930  Group Topic/Focus:  healthy support systems.   Participation Level: Did Not Attend  Participation Quality:  Not Applicable  Affect:  Not Applicable  Cognitive:  Not Applicable  Insight:  Not Applicable  Engagement in Group: Not Applicable  Additional Comments:  Did not attend.   [name] 04/24/2014, 10:04 AM

## 2014-04-24 NOTE — Progress Notes (Signed)
[facility] Group Notes:  (Nursing/MHT/Case Management/Adjunct)  Date:  04/24/2014  Time:  10:55 PM  Type of Therapy:  Psychoeducational Skills  Participation Level:  Minimal  Participation Quality:  Attentive  Affect:  Depressed  Cognitive:  Appropriate  Insight:  Appropriate  Engagement in Group:  Developing/Improving  Modes of Intervention:  Education  Summary of Progress/Problems: The patient had little to share in group this evening. He did however state that he had a good talk with his mother at dinner time. In terms of the theme for the day, his support system will be comprised of the staff at the group home where he resides.   Gennette Pac 04/24/2014, 10:55 PM

## 2014-04-25 LAB — T3, FREE: T3, Free: 3.6 pg/mL (ref 2.3–4.2)

## 2014-04-25 LAB — T4, FREE: FREE T4: 1.22 ng/dL (ref 0.80–1.80)

## 2014-04-25 LAB — CK: Total CK: 99 U/L (ref 7–232)

## 2014-04-25 LAB — CLOZAPINE (CLOZARIL)
Clozapine Lvl: 1272 mcg/L — AB
NORCLOZAPINE: 611 ug/L — AB (ref 25–400)

## 2014-04-25 NOTE — Progress Notes (Signed)
[facility] MD Progress Note  04/25/2014 12:19 PM Brady Hoffman  MRN:  [id number] Subjective:   " I'm sleeping better"   Objective:  Patient seen and chart reviewed.  He is compliant with his medication and he is reporting improvement in his anxiety and nervousness.  Patient remains very concerned about his anxiety.  However he feel medicine is helping him.  He does participate in some groups but he did not talk a lot.  He is sleeping on and off.  He continues to have difficulty expressing his thoughts.  Patient is seen by this Probation officer as outpatient and he has a long history of psychiatric illness.  Patient continued to endorse paranoia and difficulty trusting people but he likes his medication.  He requires encouragement to participate in the group.  He has no tremors or shakes.  He has no tremors.  He admitted feeling sometimes hopeless and preoccupied to his physical issues but denies any suicidal thoughts.  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  Hyperlipidemia    .  Schizoaffective disorder    .  H/O: GI bleed    .  Seizures    .  Depression    .  Chronic kidney disease    .  Anemia  01/14/2014   .  Anxiety attack     AXIS IV: housing problems, other psychosocial or environmental problems and problems related to social environment  AXIS V: 31-40 impairment in reality testing  ADL's:  Impaired  Sleep: Good  Appetite:  Fair  Suicidal Ideation:  Denies Homicidal Ideation:  Denies AEB (as evidenced by):  Psychiatric Specialty Exam: Physical Exam  Review of Systems  Constitutional: Negative.  Negative for malaise/fatigue and diaphoresis.  HENT: Negative.   Eyes: Negative.   Respiratory: Negative.   Cardiovascular: Negative.   Gastrointestinal: Negative.   Genitourinary: Negative.   Musculoskeletal:  Negative.   Skin: Negative.   Neurological: Positive for dizziness.  Endo/Heme/Allergies: Negative.   Psychiatric/Behavioral: Positive for depression. The patient is nervous/anxious.     Blood pressure 130/81, pulse 90, temperature 97 F (36.1 C), temperature source Oral, resp. rate 18, height '6\' 3"'  (1.905 m), weight 210 lb (95.255 kg), SpO2 99.00%.Body mass index is 26.25 kg/(m^2).  General Appearance: Disheveled and Body odor   Eye Contact::  Fair  Speech:  Garbled and Slow  Volume:  Decreased  Mood:  Anxious  Affect:  Constricted  Thought Process:  Circumstantial  Orientation:  Full (Time, Place, and Person)  Thought Content:  Worries, concerns   Suicidal Thoughts:  No  Homicidal Thoughts:  No  Memory:  Immediate;   Fair Recent;   Poor Remote;   Poor  Judgement:  Impaired  Insight:  Lacking  Psychomotor Activity:  Decreased  Concentration:  Fair  Recall:  [place]: Fair  Akathisia:  No  Handed:  Right  AIMS (if indicated):     Assets:  Communication Skills Desire for Improvement Physical Health  Sleep:  Number of Hours: 6.75   Musculoskeletal: Strength & Muscle Tone: within normal limits Gait & Station: normal Patient leans: N/A  Current Medications: Current Facility-Administered Medications  Medication Dose Route Frequency Provider Last Rate Last Dose  . acetaminophen (TYLENOL) tablet 650 mg  650 mg Oral Q6H PRN Benjamine Mola, FNP      .  alum & mag hydroxide-simeth (MAALOX/MYLANTA) 200-200-20 MG/5ML suspension 30 mL  30 mL Oral Q4H PRN Benjamine Mola, FNP      . aspirin chewable tablet 81 mg  81 mg Oral Daily Lurena Nida, NP   81 mg at 04/25/14 0818  . benztropine (COGENTIN) tablet 2 mg  2 mg Oral QHS Lurena Nida, NP   2 mg at 04/24/14 2142  . clonazePAM (KLONOPIN) tablet 0.5 mg  0.5 mg Oral BID Mojeed Akintayo   0.5 mg at 04/25/14 0818  . cloZAPine (CLOZARIL) tablet 100 mg  100 mg Oral BID Lurena Nida, NP   100 mg at 04/25/14  0818  . cloZAPine (CLOZARIL) tablet 400 mg  400 mg Oral QHS Lurena Nida, NP   400 mg at 04/24/14 2142  . FLUoxetine (PROZAC) capsule 20 mg  20 mg Oral Daily Mojeed Akintayo   20 mg at 04/25/14 0818  . folic acid (FOLVITE) tablet 1 mg  1 mg Oral Daily Lurena Nida, NP   1 mg at 04/25/14 0818  . levETIRAcetam (KEPPRA XR) 24 hr tablet 500 mg  500 mg Oral BID Lurena Nida, NP   500 mg at 04/25/14 0818  . levothyroxine (SYNTHROID, LEVOTHROID) tablet 50 mcg  50 mcg Oral QAC breakfast Caren Griffins, MD   50 mcg at 04/25/14 [phone]  . magnesium hydroxide (MILK OF MAGNESIA) suspension 30 mL  30 mL Oral Daily PRN Benjamine Mola, FNP      . pantoprazole (PROTONIX) EC tablet 80 mg  80 mg Oral QAC lunch Caren Griffins, MD   80 mg at 04/25/14 1209    Lab Results:  Results for orders placed during the hospital encounter of 04/21/14 (from the past 48 hour(s))  LACTATE DEHYDROGENASE     Status: None   Collection Time    04/23/14  7:20 PM      Result Value Ref Range   LDH 179  94 - 250 U/L   Comment: Performed at [place]     Status: None   Collection Time    04/23/14  7:20 PM      Result Value Ref Range   Magnesium 1.9  1.5 - 2.5 mg/dL   Comment: Performed at [facility]  TSH     Status: Abnormal   Collection Time    04/23/14  7:20 PM      Result Value Ref Range   TSH 17.420 (*) 0.350 - 4.500 uIU/mL   Comment: Please note change in reference range.     Performed at [place] PANEL     Status: Abnormal   Collection Time    04/24/14  6:45 AM      Result Value Ref Range   Sodium 143  137 - 147 mEq/L   Potassium 4.3  3.7 - 5.3 mEq/L   Chloride 105  96 - 112 mEq/L   CO2 26  19 - 32 mEq/L   Glucose, Bld 100 (*) 70 - 99 mg/dL   BUN 16  6 - 23 mg/dL   Creatinine, Ser 1.24  0.50 - 1.35 mg/dL   Calcium 8.7  8.4 - 10.5 mg/dL   Total Protein 6.6  6.0 - 8.3 g/dL   Albumin 3.5  3.5 - 5.2 g/dL   AST 13  0 - 37  U/L   ALT 11  0 - 53 U/L   Alkaline Phosphatase 111  39 - 117 U/L   Total Bilirubin 0.2 (*) 0.3 - 1.2 mg/dL   GFR calc non Af Amer 67 (*) >90 mL/min   GFR calc Af Amer 78 (*) >90 mL/min   Comment: (NOTE)     The eGFR has been calculated using the CKD EPI equation.     This calculation has not been validated in all clinical situations.     eGFR's persistently <90 mL/min signify possible Chronic Kidney     Disease.     Performed at [facility]  CK     Status: None   Collection Time    04/25/14  6:30 AM      Result Value Ref Range   Total CK 99  7 - 232 U/L   Comment: Performed at [facility]  T3, FREE     Status: None   Collection Time    04/25/14  6:30 AM      Result Value Ref Range   T3, Free 3.6  2.3 - 4.2 pg/mL   Comment: Performed at [facility]  T4, FREE     Status: None   Collection Time    04/25/14  6:30 AM      Result Value Ref Range   Free T4 1.22  0.80 - 1.80 ng/dL   Comment: Performed at [facility]    Physical Findings: AIMS: Facial and Oral Movements Muscles of Facial Expression: Mild Lips and Perioral Area: Mild Jaw: Mild Tongue: Mild,Extremity Movements Upper (arms, wrists, hands, fingers): Minimal Lower (legs, knees, ankles, toes): None, normal, Trunk Movements Neck, shoulders, hips: Minimal, Overall Severity Severity of abnormal movements (highest score from questions above): Mild Incapacitation due to abnormal movements: Minimal Patient's awareness of abnormal movements (rate only patient's report): Aware, mild distress, Dental Status Current problems with teeth and/or dentures?: No Does patient usually wear dentures?: No  CIWA:    COWS:     Treatment Plan Summary: Daily contact with patient to assess and evaluate symptoms and progress in treatment Medication management  Plan: 1. Continue crisis management and stabilization.  2. Medication management: Continue Klonopin 0.5 mg BID for  anxiety, Clozaril as ordered, Prozac 20 mg daily for depression.  3. Encouraged patient to attend groups and participate in group counseling sessions and activities.  4. Discharge plan in progress.  5. Continue current treatment plan.  6. Address medical issues: Vitals stable. Continue Keppra for seizure management. Internal medicine following patient for abnormal lab values.   Medical Decision Making Problem Points:  Established problem, stable/improving (1), Review of last therapy session (1) and Review of psycho-social stressors (1) Data Points:  Order Aims Assessment (2) Review or order clinical lab tests (1) Review or order medicine tests (1) Review of medication regiment & side effects (2)  I certify that inpatient services furnished can reasonably be expected to improve the patient's condition.   Kathlee Nations, MD 04/25/2014, 12:19 PM

## 2014-04-25 NOTE — Progress Notes (Signed)
-   CK back to normal.  - continue Synthroid, follow up with PCP as outpatient in 4-6 weeks for TSH check.   Will sign off, thank you, please call with questions.   Costin M. Cruzita Lederer, MD [facility] [phone]

## 2014-04-25 NOTE — Progress Notes (Signed)
Patient ID: Brady Hoffman, male   DOB: 05-Nov-1966, 48 y.o.   MRN: [id number] D: Patient presents with anxious mood; flat affect.  He denies any SI/HI/AVH today.  He denies any depressive symptoms.  His complaint is drowsiness.  Patient is lethargic and has delayed reactions.  He is compliant with his medications.  Patient has been lying in bed with no interaction with staff or his peers.  Patient denies any pain; no panic attacks noted.  No side affects from medications; he is tolerating them well. A: continue to monitor medication management.  Safety checks completed every 15 per protocol.  Encourage patient to be visible on the unit and attempt to be present in groups.  Educated patient on current medications and patient indicated understanding. R: patient is receptive to staff and isolative to room.

## 2014-04-25 NOTE — Progress Notes (Signed)
Adult Psychoeducational Group Note  Date:  04/25/2014 Time:  9:48 PM  Group Topic/Focus:  Wrap-Up Group:   The focus of this group is to help patients review their daily goal of treatment and discuss progress on daily workbooks.  Participation Level:  Minimal  Participation Quality:  Appropriate  Affect:  Flat  Cognitive:  Appropriate  Insight: Limited  Engagement in Group:  Limited  Modes of Intervention:  Education, Socialization and Support  Additional Comments:  Patient attended and participated in group tonight.  He reports having an OK day. He read the [place] times today, his mother visited with him, he went for meals and attended his groups. The patient advised that when he is well he reads a lot watch DVDs and eat out a lot.  Brady Hoffman 04/25/2014, 9:48 PM

## 2014-04-25 NOTE — Progress Notes (Signed)
Patient ID: Brady Hoffman, male   DOB: [birth year], 48 y.o.   MRN: [id number] He has been in and out of room with little interaction with staff or peers.  He denies having any discomfort and has gone to pm meal.

## 2014-04-26 ENCOUNTER — Ambulatory Visit (HOSPITAL_COMMUNITY): Payer: Self-pay | Admitting: Psychology

## 2014-04-26 ENCOUNTER — Telehealth (HOSPITAL_COMMUNITY): Payer: Self-pay

## 2014-04-26 MED ORDER — CLOZAPINE 100 MG PO TABS
200.0000 mg | ORAL_TABLET | Freq: Every day | ORAL | Status: DC
  Administered 2014-04-26 – 2014-05-03 (×8): 200 mg via ORAL
  Filled 2014-04-26 (×2): qty 2
  Filled 2014-04-26: qty 6
  Filled 2014-04-26 (×3): qty 2
  Filled 2014-04-26: qty 6
  Filled 2014-04-26 (×5): qty 2

## 2014-04-26 NOTE — Progress Notes (Signed)
D:  Per pt self inventory pt reports sleeping well, appetite good, energy level low, ability to pay attention poor, rates depression at 5 out of 10 and hopelessness at a 2 out of 10, denies SI/HI/AVH, quiet and cooperative, c/o occasional anxiety.  A:  Emotional support provided, Encouraged pt to continue with treatment plan and attend all group activities, q15 min checks maintained for safety.  R:  Pt is receptive, going to groups, cooperative with staff and other patients,

## 2014-04-26 NOTE — Progress Notes (Signed)
Adult Psychoeducational Group Note  Date:  04/26/2014 Time:  11:01 PM  Group Topic/Focus:  Wrap-Up Group:   The focus of this group is to help patients review their daily goal of treatment and discuss progress on daily workbooks.  Participation Level:  Minimal  Participation Quality:  Minimal  Affect:  Flat  Cognitive:  Lacking  Insight: Lacking and Limited  Engagement in Group:  Limited  Modes of Intervention:  Education, Socialization and Support  Additional Comments:  Patient attended and participated in group tonight. He report that he did not do much today. He slept a lot because he believe the new medication that he is taking is making him drowsy. His mother visited with him today. He went for his meals, did not went to groups or outside with the group. For his personal development her plans to go back to the group home where lives.  Brady Hoffman [name] 04/26/2014, 11:01 PM

## 2014-04-26 NOTE — Progress Notes (Signed)
Patient ID: Brady Hoffman, male   DOB: Aug 15, 1966, 48 y.o.   MRN: [id number] [facility] MD Progress Note  04/26/2014 11:43 AM Brady Hoffman  MRN:  [id number] Subjective:   Patient states "I am very sleepy. It's hard for me to get up. I am not feeling that anxious really."   Objective:  Patient seen and chart reviewed.  He is compliant with his medication and he is reporting improvement in his anxiety and nervousness. Patient assessed in his room. Patient is attending some groups with encouragement. [name] got up to shower when Probation officer encouraged him to become more active in order to feel less drowsy. Patient is very pleasant during interactions with staff. His participation level on the unit is minimal. Spoke to Dr. Adele Schilder who called to inform writer that the patient's clozaril level is 1272. It was decided that his bedtime dose should be decreased to 200 mg at hs. His mother continues to visit him regularly in the hospital.   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  Hyperlipidemia    .  Schizoaffective disorder    .  H/O: GI bleed    .  Seizures    .  Depression    .  Chronic kidney disease    .  Anemia  01/14/2014   .  Anxiety attack     AXIS IV: housing problems, other psychosocial or environmental problems and problems related to social environment  AXIS V: 41-50 Serious Symptoms   ADL's:  Impaired  Sleep: Good  Appetite:  Fair  Suicidal Ideation:  Denies Homicidal Ideation:  Denies AEB (as evidenced by):  Psychiatric Specialty Exam: Physical Exam  Review of Systems  Constitutional: Negative.   HENT: Negative.   Eyes: Negative.   Respiratory: Negative.   Cardiovascular: Negative.   Gastrointestinal: Negative.   Genitourinary: Negative.   Musculoskeletal: Negative.   Skin: Negative.    Neurological: Negative.  Negative for dizziness.  Endo/Heme/Allergies: Negative.   Psychiatric/Behavioral: Positive for depression. Negative for suicidal ideas, hallucinations, memory loss and substance abuse. The patient is nervous/anxious. The patient does not have insomnia.     Blood pressure 109/74, pulse 77, temperature 97.3 F (36.3 C), temperature source Oral, resp. rate 16, height 6\' 3"  (1.905 m), weight 95.255 kg (210 lb), SpO2 99.00%.Body mass index is 26.25 kg/(m^2).  General Appearance: Disheveled and Body odor   Eye Contact::  Fair  Speech:  Garbled and Slow  Volume:  Decreased  Mood:  Anxious  Affect:  Constricted  Thought Process:  Circumstantial  Orientation:  Full (Time, Place, and Person)  Thought Content:  Worries, concerns   Suicidal Thoughts:  No  Homicidal Thoughts:  No  Memory:  Immediate;   Fair Recent;   Poor Remote;   Poor  Judgement:  Impaired  Insight:  Lacking  Psychomotor Activity:  Decreased  Concentration:  Fair  Recall:  [place]: Fair  Akathisia:  No  Handed:  Right  AIMS (if indicated):     Assets:  Communication Skills Desire for Improvement Physical Health  Sleep:  Number of Hours: 6.75   Musculoskeletal: Strength & Muscle Tone: within normal limits Gait & Station: normal Patient leans: N/A  Current Medications: Current Facility-Administered Medications  Medication Dose Route Frequency Provider Last Rate Last Dose  .  acetaminophen (TYLENOL) tablet 650 mg  650 mg Oral Q6H PRN Benjamine Mola, FNP      . alum & mag hydroxide-simeth (MAALOX/MYLANTA) 200-200-20 MG/5ML suspension 30 mL  30 mL Oral Q4H PRN Benjamine Mola, FNP      . aspirin chewable tablet 81 mg  81 mg Oral Daily Lurena Nida, NP   81 mg at 04/26/14 0815  . benztropine (COGENTIN) tablet 2 mg  2 mg Oral QHS Lurena Nida, NP   2 mg at 04/25/14 2127  . clonazePAM (KLONOPIN) tablet 0.5 mg  0.5 mg Oral BID Mojeed Akintayo   0.5 mg at 04/26/14  0815  . cloZAPine (CLOZARIL) tablet 100 mg  100 mg Oral BID Lurena Nida, NP   100 mg at 04/26/14 9381  . cloZAPine (CLOZARIL) tablet 200 mg  200 mg Oral QHS Elmarie Shiley, NP      . FLUoxetine (PROZAC) capsule 20 mg  20 mg Oral Daily Mojeed Akintayo   20 mg at 04/26/14 0814  . folic acid (FOLVITE) tablet 1 mg  1 mg Oral Daily Lurena Nida, NP   1 mg at 04/26/14 0815  . levETIRAcetam (KEPPRA XR) 24 hr tablet 500 mg  500 mg Oral BID Lurena Nida, NP   500 mg at 04/26/14 8299  . levothyroxine (SYNTHROID, LEVOTHROID) tablet 50 mcg  50 mcg Oral QAC breakfast Caren Griffins, MD   50 mcg at 04/26/14 [phone]  . magnesium hydroxide (MILK OF MAGNESIA) suspension 30 mL  30 mL Oral Daily PRN Benjamine Mola, FNP      . pantoprazole (PROTONIX) EC tablet 80 mg  80 mg Oral QAC lunch Caren Griffins, MD   80 mg at 04/25/14 1209    Lab Results:  Results for orders placed during the hospital encounter of 04/21/14 (from the past 48 hour(s))  CK     Status: None   Collection Time    04/25/14  6:30 AM      Result Value Ref Range   Total CK 99  7 - 232 U/L   Comment: Performed at [facility]  T3, FREE     Status: None   Collection Time    04/25/14  6:30 AM      Result Value Ref Range   T3, Free 3.6  2.3 - 4.2 pg/mL   Comment: Performed at [facility]  T4, FREE     Status: None   Collection Time    04/25/14  6:30 AM      Result Value Ref Range   Free T4 1.22  0.80 - 1.80 ng/dL   Comment: Performed at [facility]    Physical Findings: AIMS: Facial and Oral Movements Muscles of Facial Expression: Mild Lips and Perioral Area: Mild Jaw: Mild Tongue: Mild,Extremity Movements Upper (arms, wrists, hands, fingers): Minimal Lower (legs, knees, ankles, toes): None, normal, Trunk Movements Neck, shoulders, hips: Minimal, Overall Severity Severity of abnormal movements (highest score from questions above): Mild Incapacitation due to abnormal movements: Minimal Patient's  awareness of abnormal movements (rate only patient's report): Aware, mild distress, Dental Status Current problems with teeth and/or dentures?: No Does patient usually wear dentures?: No  CIWA:    COWS:     Treatment Plan Summary: Daily contact with patient to assess and evaluate symptoms and progress in treatment Medication management  Plan: 1. Continue crisis management and stabilization.  2. Medication management:  -Continue Klonopin 0.5 mg BID for anxiety  -  Continue Clozaril 100 mg BID but decrease hs dose to 200 mg.   -ContinueProzac 20 mg daily for depression.  3. Encouraged patient to attend groups and participate in group counseling sessions and activities.  4. Discharge plan in progress. Anticipate discharge on Thursday.  5. Continue current treatment plan.  6. Address medical issues: Vitals stable. Continue Keppra for seizure management. Continue Synthroid 50 mcg for hypothyroidism that was started by Internal Medicine. Order CBC with diff for morning of 04/27/14.    Medical Decision Making Problem Points:  Established problem, stable/improving (1), Review of last therapy session (1) and Review of psycho-social stressors (1) Data Points:  Order Aims Assessment (2) Review or order clinical lab tests (1) Review or order medicine tests (1) Review of medication regiment & side effects (2)  I certify that inpatient services furnished can reasonably be expected to improve the patient's condition.   Elmarie Shiley, NP-C 04/26/2014, 11:43 AM Agree with assessment and plan Woodroe Chen. Sabra Heck, M.D.

## 2014-04-26 NOTE — Progress Notes (Signed)
D   Pt was pleasant when he came to get his medications   His affect has improved and his eye contact is better   His interactions with others is limited but he is polite and appropriate A   Verbal support given   Medications administered and effectiveness monitored  Q 15 min checks R   Pt safe at present

## 2014-04-26 NOTE — Care Management Utilization Note (Signed)
Per State Regulation 482.30  The chart was reviewed for necessity with respect to the patient's Admission/ Duration of stay.04/25/14  Next Review KJZP:08/16/04  Conception Oms, RN, BSN

## 2014-04-27 DIAGNOSIS — F4001 Agoraphobia with panic disorder: Secondary | ICD-10-CM

## 2014-04-27 DIAGNOSIS — F259 Schizoaffective disorder, unspecified: Secondary | ICD-10-CM

## 2014-04-27 LAB — CBC WITH DIFFERENTIAL/PLATELET
BASOS ABS: 0.1 10*3/uL (ref 0.0–0.1)
Basophils Relative: 1 % (ref 0–1)
Eosinophils Absolute: 0.7 10*3/uL (ref 0.0–0.7)
Eosinophils Relative: 12 % — ABNORMAL HIGH (ref 0–5)
HCT: 34.5 % — ABNORMAL LOW (ref 39.0–52.0)
Hemoglobin: 11.8 g/dL — ABNORMAL LOW (ref 13.0–17.0)
LYMPHS ABS: 2.6 10*3/uL (ref 0.7–4.0)
Lymphocytes Relative: 40 % (ref 12–46)
MCH: 30.5 pg (ref 26.0–34.0)
MCHC: 34.2 g/dL (ref 30.0–36.0)
MCV: 89.1 fL (ref 78.0–100.0)
Monocytes Absolute: 0.6 10*3/uL (ref 0.1–1.0)
Monocytes Relative: 9 % (ref 3–12)
NEUTROS ABS: 2.4 10*3/uL (ref 1.7–7.7)
Neutrophils Relative %: 38 % — ABNORMAL LOW (ref 43–77)
Platelets: 168 10*3/uL (ref 150–400)
RBC: 3.87 MIL/uL — AB (ref 4.22–5.81)
RDW: 12 % (ref 11.5–15.5)
WBC: 6.3 10*3/uL (ref 4.0–10.5)

## 2014-04-27 NOTE — BHH Group Notes (Signed)
[facility] Group Therapy  04/27/2014 , 11:28 AM    Type of Therapy:  [facility] [employer] Presentation  Participation Level:  Invited  Chose not to attend  Summary of Progress/Problems:  Brady Hoffman from [place] came to present his recovery story and play the guitar.    Roque Lias B 04/27/2014 , 11:28 AM

## 2014-04-27 NOTE — BHH Group Notes (Signed)
[facility] LCSW Aftercare Discharge Planning Group Note   04/27/2014 10:52 AM  Participation Quality:  Did not attend    Trish Mage

## 2014-04-27 NOTE — Progress Notes (Signed)
Patient ID: Brady Hoffman, male   DOB: 1966-09-30, 48 y.o.   MRN: [id number] [facility] MD Progress Note  04/27/2014 12:12 PM Brady Hoffman  MRN:  [id number] Subjective:   Patient states "I had a little panic attack yesterday. I'm not really sure what caused it. I just feel sleepy in the morning. I am eating good."   Objective:  Patient seen and chart reviewed.  He is compliant with his medication and he is reporting improvement in his anxiety and nervousness. Patient assessed in his room. Patient is attending some groups with encouragement. Dayven continues to complain of feeling sleepy in the morning but his dosage of Clozapine was just reduced at bedtime. Reviewed CBC with diff results with Dr. Adele Schilder who has been following the patient from outpatient. Noted that the patient's neutrophil count has been trending down over the last few days. The patient is noted to be afebrile with no signs and symptoms of infection present. He will have daily CBC with diff to closely monitor for the next few days. Patient was possibly scheduled for discharge tomorrow. However, due to these abnormal lab values will need to be postponed to further monitor. Spoke with the social worker [employer] about these changes to the discharge plan.   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  Hyperlipidemia    .  Schizoaffective disorder    .  H/O: GI bleed    .  Seizures    .  Depression    .  Chronic kidney disease    .  Anemia  01/14/2014   .  Anxiety attack     AXIS IV: housing problems, other psychosocial or environmental problems and problems related to social environment  AXIS V: 41-50 Serious Symptoms   ADL's:  Impaired  Sleep: Good  Appetite:  Fair  Suicidal Ideation:  Denies Homicidal Ideation:  Denies AEB (as evidenced  by):  Psychiatric Specialty Exam: Physical Exam  Review of Systems  Constitutional: Negative.   HENT: Negative.   Eyes: Negative.   Respiratory: Negative.   Cardiovascular: Negative.   Gastrointestinal: Negative.   Genitourinary: Negative.   Musculoskeletal: Negative.   Skin: Negative.   Neurological: Negative.   Endo/Heme/Allergies: Negative.   Psychiatric/Behavioral: Positive for depression. Negative for suicidal ideas, hallucinations, memory loss and substance abuse. The patient is nervous/anxious. The patient does not have insomnia.     Blood pressure 105/78, pulse 101, temperature 97.6 F (36.4 C), temperature source Oral, resp. rate 16, height 6\' 3"  (1.905 m), weight 95.255 kg (210 lb), SpO2 99.00%.Body mass index is 26.25 kg/(m^2).  General Appearance: Disheveled and Body odor   Eye Contact::  Fair  Speech:  Garbled and Slow  Volume:  Decreased  Mood:  Anxious  Affect:  Constricted  Thought Process:  Circumstantial  Orientation:  Full (Time, Place, and Person)  Thought Content:  Worries, concerns   Suicidal Thoughts:  No  Homicidal Thoughts:  No  Memory:  Immediate;   Fair Recent;   Poor Remote;   Poor  Judgement:  Impaired  Insight:  Lacking  Psychomotor Activity:  Decreased  Concentration:  Fair  Recall:  [name]  Language: Fair  Akathisia:  No  Handed:  Right  AIMS (if indicated):     Assets:  Communication Skills Desire for  Improvement Physical Health  Sleep:  Number of Hours: 6.75   Musculoskeletal: Strength & Muscle Tone: within normal limits Gait & Station: normal Patient leans: N/A  Current Medications: Current Facility-Administered Medications  Medication Dose Route Frequency Provider Last Rate Last Dose  . acetaminophen (TYLENOL) tablet 650 mg  650 mg Oral Q6H PRN Benjamine Mola, FNP      . alum & mag hydroxide-simeth (MAALOX/MYLANTA) 200-200-20 MG/5ML suspension 30 mL  30 mL Oral Q4H PRN Benjamine Mola, FNP      .  aspirin chewable tablet 81 mg  81 mg Oral Daily Lurena Nida, NP   81 mg at 04/27/14 2025  . benztropine (COGENTIN) tablet 2 mg  2 mg Oral QHS Lurena Nida, NP   2 mg at 04/26/14 2105  . clonazePAM (KLONOPIN) tablet 0.5 mg  0.5 mg Oral BID Mojeed Akintayo   0.5 mg at 04/27/14 0803  . cloZAPine (CLOZARIL) tablet 100 mg  100 mg Oral BID Lurena Nida, NP   100 mg at 04/27/14 4270  . cloZAPine (CLOZARIL) tablet 200 mg  200 mg Oral QHS Elmarie Shiley, NP   200 mg at 04/26/14 2105  . FLUoxetine (PROZAC) capsule 20 mg  20 mg Oral Daily Mojeed Akintayo   20 mg at 04/27/14 0803  . folic acid (FOLVITE) tablet 1 mg  1 mg Oral Daily Lurena Nida, NP   1 mg at 04/27/14 6237  . levETIRAcetam (KEPPRA XR) 24 hr tablet 500 mg  500 mg Oral BID Lurena Nida, NP   500 mg at 04/27/14 6283  . levothyroxine (SYNTHROID, LEVOTHROID) tablet 50 mcg  50 mcg Oral QAC breakfast Caren Griffins, MD   50 mcg at 04/27/14 [phone]  . magnesium hydroxide (MILK OF MAGNESIA) suspension 30 mL  30 mL Oral Daily PRN Benjamine Mola, FNP      . pantoprazole (PROTONIX) EC tablet 80 mg  80 mg Oral QAC lunch Caren Griffins, MD   80 mg at 04/26/14 1210    Lab Results:  Results for orders placed during the hospital encounter of 04/21/14 (from the past 48 hour(s))  CBC WITH DIFFERENTIAL     Status: Abnormal   Collection Time    04/27/14  6:30 AM      Result Value Ref Range   WBC 6.3  4.0 - 10.5 K/uL   RBC 3.87 (*) 4.22 - 5.81 MIL/uL   Hemoglobin 11.8 (*) 13.0 - 17.0 g/dL   HCT 34.5 (*) 39.0 - 52.0 %   MCV 89.1  78.0 - 100.0 fL   MCH 30.5  26.0 - 34.0 pg   MCHC 34.2  30.0 - 36.0 g/dL   RDW 12.0  11.5 - 15.5 %   Platelets 168  150 - 400 K/uL   Neutrophils Relative % 38 (*) 43 - 77 %   Neutro Abs 2.4  1.7 - 7.7 K/uL   Lymphocytes Relative 40  12 - 46 %   Lymphs Abs 2.6  0.7 - 4.0 K/uL   Monocytes Relative 9  3 - 12 %   Monocytes Absolute 0.6  0.1 - 1.0 K/uL   Eosinophils Relative 12 (*) 0 - 5 %   Eosinophils Absolute 0.7  0.0 -  0.7 K/uL   Basophils Relative 1  0 - 1 %   Basophils Absolute 0.1  0.0 - 0.1 K/uL   Comment: Performed at [facility]    Physical Findings: AIMS: Facial and Oral  Movements Muscles of Facial Expression: Mild Lips and Perioral Area: Mild Jaw: Mild Tongue: Mild,Extremity Movements Upper (arms, wrists, hands, fingers): Minimal Lower (legs, knees, ankles, toes): None, normal, Trunk Movements Neck, shoulders, hips: Minimal, Overall Severity Severity of abnormal movements (highest score from questions above): Mild Incapacitation due to abnormal movements: Minimal Patient's awareness of abnormal movements (rate only patient's report): Aware, mild distress, Dental Status Current problems with teeth and/or dentures?: No Does patient usually wear dentures?: No  CIWA:    COWS:     Treatment Plan Summary: Daily contact with patient to assess and evaluate symptoms and progress in treatment Medication management  Plan: 1. Continue crisis management and stabilization.  2. Medication management:  -Continue Klonopin 0.5 mg BID for anxiety  -Continue Clozaril 100 mg BID and 200 mg hs.   -ContinueProzac 20 mg daily for depression.  3. Encouraged patient to attend groups and participate in group counseling sessions and activities.  4. Discharge plan in progress. Anticipate discharge on Thursday.  5. Continue current treatment plan.  6. Address medical issues: Vitals stable. Continue Keppra for seizure management. Continue Synthroid 50 mcg for hypothyroidism that was started by Internal Medicine. Order CBC with diff daily for the next three days.   Medical Decision Making Problem Points:  Established problem, stable/improving (1), Review of last therapy session (1) and Review of psycho-social stressors (1) Data Points:  Order Aims Assessment (2) Review or order clinical lab tests (1) Review or order medicine tests (1) Review of medication regiment & side effects (2)  I  certify that inpatient services furnished can reasonably be expected to improve the patient's condition.   Elmarie Shiley, NP-C 04/27/2014, 12:12 PM

## 2014-04-27 NOTE — Tx Team (Signed)
  Interdisciplinary Treatment Plan Update   Date Reviewed:  04/27/2014  Time Reviewed:  8:06 AM  Progress in Treatment:   Attending groups: Yes Participating in groups: Yes Taking medication as prescribed: Yes  Tolerating medication: Yes Family/Significant other contact made: Yes  Patient understands diagnosis: Yes  Discussing patient identified problems/goals with staff: Yes Medical problems stabilized or resolved: Yes Denies suicidal/homicidal ideation: Yes Patient has not harmed self or others: Yes  For review of initial/current patient goals, please see plan of care.  Estimated Length of Stay:  1-2 days  Reason for Continuation of Hospitalization: Anxiety Medication stabilization  New Problems/Goals identified:  N/A  Discharge Plan or Barriers:   return to [facility], follow up outpt  Additional Comments:  Pt was pleasant when he came to get his medications His affect has improved and his eye contact is better His interactions with others is limited but he is polite and appropriate.  Anxiety is reduced and clozaril decreased at bedtime due to blood level showing high.  CSW to coordinate with mother and [facility] manager for transfer back.  Attendees:  Signature: Corena Pilgrim, MD 04/27/2014 8:06 AM   Signature: Ripley Fraise, LCSW 04/27/2014 8:06 AM  Signature: Elmarie Shiley, NP 04/27/2014 8:06 AM  Signature: Mayra Neer, RN 04/27/2014 8:06 AM  Signature: Darrol Angel, RN 04/27/2014 8:06 AM  Signature:  04/27/2014 8:06 AM  Signature:   04/27/2014 8:06 AM  Signature:    Signature:    Signature:    Signature:    Signature:    Signature:      Scribe for Treatment Team:   Ripley Fraise, LCSW  04/27/2014 8:06 AM

## 2014-04-27 NOTE — Progress Notes (Signed)
Pt was interviewed with NP. Case discussed with Dr. Adele Hoffman. Agree with above assessment and plan. Will closely monitor WBC/ANC over next few days, since pt on clozaril.  Brady Estimable, MD

## 2014-04-27 NOTE — Progress Notes (Signed)
Child/Adolescent Psychoeducational Group Note  Date:  04/27/2014 Time:  8:34 PM  Group Topic/Focus:  Wrap-Up Group:   The focus of this group is to help patients review their daily goal of treatment and discuss progress on daily workbooks.  Participation Level:  Did Not Attend  Participation Quality:  Resistant  Affect:  Resistant  Cognitive:  Lacking  Insight:  None  Engagement in Group:  Resistant  Modes of Intervention:  Discussion  Additional Comments:  Pt did not attend the wrap up group.  Brady Hoffman 04/27/2014, 8:34 PM

## 2014-04-27 NOTE — Progress Notes (Signed)
D:  Patient up in the milieu some, but stays to himself much of the time.  He denies depression or hopelessness.  His affect is flat, but he does brighten some when speaking with him.  His appetite is good and he is taking all medications.  WBC and neutrophil counts are lower today.  Provider is aware.   A:  Medications given as prescribed.  Offered support and encouragement.   R:  Cooperative with staff.  Minimal interaction with peers.  Tolerating medications.  Safety is maintained.

## 2014-04-27 NOTE — Progress Notes (Signed)
Patient ID: Brady Hoffman, male   DOB: 06-22-1966, 48 y.o.   MRN: [id number] D: Patient in room most of the evening not interacting with peers. Pt mood and affect appeared depressed and flat. Pt denies SI/HI/AVH and pain. Pt encouraged to come out of room to get something to eat. Cooperative with assessment. No acute distressed noted at this time.   A: Met with pt 1:1. Medications administered as prescribed. Writer encouraged pt to discuss feelings. Pt encouraged to come to staff with any question or concerns.   R: Patient remains safe. He is complaint with medications and denies any adverse reaction. Continue current POC.

## 2014-04-28 LAB — CBC WITH DIFFERENTIAL/PLATELET
BASOS PCT: 1 % (ref 0–1)
Basophils Absolute: 0 10*3/uL (ref 0.0–0.1)
Eosinophils Absolute: 0.7 10*3/uL (ref 0.0–0.7)
Eosinophils Relative: 12 % — ABNORMAL HIGH (ref 0–5)
HCT: 35.5 % — ABNORMAL LOW (ref 39.0–52.0)
HEMOGLOBIN: 12.1 g/dL — AB (ref 13.0–17.0)
LYMPHS ABS: 2.4 10*3/uL (ref 0.7–4.0)
Lymphocytes Relative: 42 % (ref 12–46)
MCH: 30.6 pg (ref 26.0–34.0)
MCHC: 34.1 g/dL (ref 30.0–36.0)
MCV: 89.9 fL (ref 78.0–100.0)
MONOS PCT: 9 % (ref 3–12)
Monocytes Absolute: 0.5 10*3/uL (ref 0.1–1.0)
NEUTROS ABS: 2.1 10*3/uL (ref 1.7–7.7)
Neutrophils Relative %: 36 % — ABNORMAL LOW (ref 43–77)
PLATELETS: 162 10*3/uL (ref 150–400)
RBC: 3.95 MIL/uL — ABNORMAL LOW (ref 4.22–5.81)
RDW: 12 % (ref 11.5–15.5)
WBC: 5.7 10*3/uL (ref 4.0–10.5)

## 2014-04-28 NOTE — Care Management Utilization Note (Signed)
   Per State Regulation 482.30  This chart was reviewed for necessity with respect to the patient's Admission/ Duration of stay.  Next review date: 05/01/14  Skipper Cliche RN, BSN

## 2014-04-28 NOTE — Progress Notes (Signed)
Patient ID: Brady Hoffman, male   DOB: [birth year], 48 y.o.   MRN: [id number] [facility] MD Progress Note  04/28/2014 3:06 PM Brady Hoffman  MRN:  [id number] Subjective:   Patient states "I am very sleepy this morning. I am having trouble waking up. I started hearing some voices since yesterday. I don't know what they say. My anxiety is at a medium level."   Objective:  Patient seen and chart reviewed. He is assessed in his room today. Patient is noted to be sleeping later in the morning. However, when prompted agrees to get up. Nursing staff report that patient has largely been isolative to his room. Patient continues to deny any symptoms of infection or physical complaints. His CBC's are being closely monitored for declining neutrophil counts. The patient is reporting some ongoing symptoms of anxiety and now reports hearing voices. He is unable to identify a trigger for his symptoms. His mother continues to visit him in the hospital and call to speak with nursing staff in order to check on his progress. Discussed his CBC values with Dr. Parke Poisson at length. Have been monitoring CBC's closely due to patient taking Clozaril. It appears from the lab values that his absolute neutrophil count spiked around 04/21/14 possibly due to an immune reaction but now is starting to decline. Not entirely clear if the abnormalities are related to clozaril or recent infection. Will continue to monitor daily to further evaluate. Dr. Parke Poisson is in agreement with plan.   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  Hyperlipidemia    .  Schizoaffective disorder    .  H/O: GI bleed    .  Seizures    .  Depression    .  Chronic kidney disease    .  Anemia  01/14/2014   .  Anxiety attack     AXIS IV: housing problems, other psychosocial or environmental  problems and problems related to social environment  AXIS V: 41-50 Serious Symptoms   ADL's:  Impaired  Sleep: Good  Appetite:  Fair  Suicidal Ideation:  Denies Homicidal Ideation:  Denies AEB (as evidenced by):  Psychiatric Specialty Exam: Physical Exam  Review of Systems  Constitutional: Negative.   HENT: Negative.   Eyes: Negative.   Respiratory: Negative.   Cardiovascular: Negative.   Gastrointestinal: Negative.   Genitourinary: Negative.   Musculoskeletal: Negative.   Skin: Negative.   Neurological: Negative.   Endo/Heme/Allergies: Negative.   Psychiatric/Behavioral: Positive for depression and hallucinations. Negative for suicidal ideas, memory loss and substance abuse. The patient is nervous/anxious. The patient does not have insomnia.     Blood pressure 122/73, pulse 93, temperature 97.1 F (36.2 C), temperature source Oral, resp. rate 20, height 6\' 3"  (1.905 m), weight 95.255 kg (210 lb), SpO2 99.00%.Body mass index is 26.25 kg/(m^2).  General Appearance: Disheveled  Eye Sport and exercise psychologist::  Fair  Speech:  Garbled and Slow  Volume:  Decreased  Mood:  Anxious  Affect:  Constricted  Thought Process:  Circumstantial  Orientation:  Full (Time, Place, and Person)  Thought Content:  Worries, concerns   Suicidal Thoughts:  No  Homicidal Thoughts:  No  Memory:  Immediate;   Fair Recent;   Poor Remote;   Poor  Judgement:  Impaired  Insight:  Lacking  Psychomotor Activity:  Decreased  Concentration:  Fair  Recall:  [place] of [place]: Fair  Akathisia:  No  Handed:  Right  AIMS (if indicated):     Assets:  Communication Skills Desire for Improvement Physical Health  Sleep:  Number of Hours: 6.75   Musculoskeletal: Strength & Muscle Tone: within normal limits Gait & Station: normal Patient leans: N/A  Current Medications: Current Facility-Administered Medications  Medication Dose Route Frequency Provider Last Rate Last Dose  . acetaminophen  (TYLENOL) tablet 650 mg  650 mg Oral Q6H PRN Benjamine Mola, FNP      . alum & mag hydroxide-simeth (MAALOX/MYLANTA) 200-200-20 MG/5ML suspension 30 mL  30 mL Oral Q4H PRN Benjamine Mola, FNP      . aspirin chewable tablet 81 mg  81 mg Oral Daily Lurena Nida, NP   81 mg at 04/28/14 0734  . benztropine (COGENTIN) tablet 2 mg  2 mg Oral QHS Lurena Nida, NP   2 mg at 04/27/14 2125  . clonazePAM (KLONOPIN) tablet 0.5 mg  0.5 mg Oral BID Mojeed Akintayo   0.5 mg at 04/28/14 0735  . cloZAPine (CLOZARIL) tablet 100 mg  100 mg Oral BID Lurena Nida, NP   100 mg at 04/28/14 0734  . cloZAPine (CLOZARIL) tablet 200 mg  200 mg Oral QHS Elmarie Shiley, NP   200 mg at 04/27/14 2125  . FLUoxetine (PROZAC) capsule 20 mg  20 mg Oral Daily Mojeed Akintayo   20 mg at 04/28/14 0734  . folic acid (FOLVITE) tablet 1 mg  1 mg Oral Daily Lurena Nida, NP   1 mg at 04/28/14 0734  . levETIRAcetam (KEPPRA XR) 24 hr tablet 500 mg  500 mg Oral BID Lurena Nida, NP   500 mg at 04/28/14 0734  . levothyroxine (SYNTHROID, LEVOTHROID) tablet 50 mcg  50 mcg Oral QAC breakfast Caren Griffins, MD   50 mcg at 04/28/14 [phone]  . magnesium hydroxide (MILK OF MAGNESIA) suspension 30 mL  30 mL Oral Daily PRN Benjamine Mola, FNP      . pantoprazole (PROTONIX) EC tablet 80 mg  80 mg Oral QAC lunch Caren Griffins, MD   80 mg at 04/28/14 1201    Lab Results:  Results for orders placed during the hospital encounter of 04/21/14 (from the past 48 hour(s))  CBC WITH DIFFERENTIAL     Status: Abnormal   Collection Time    04/27/14  6:30 AM      Result Value Ref Range   WBC 6.3  4.0 - 10.5 K/uL   RBC 3.87 (*) 4.22 - 5.81 MIL/uL   Hemoglobin 11.8 (*) 13.0 - 17.0 g/dL   HCT 34.5 (*) 39.0 - 52.0 %   MCV 89.1  78.0 - 100.0 fL   MCH 30.5  26.0 - 34.0 pg   MCHC 34.2  30.0 - 36.0 g/dL   RDW 12.0  11.5 - 15.5 %   Platelets 168  150 - 400 K/uL   Neutrophils Relative % 38 (*) 43 - 77 %   Neutro Abs 2.4  1.7 - 7.7 K/uL   Lymphocytes Relative  40  12 - 46 %   Lymphs Abs 2.6  0.7 - 4.0 K/uL   Monocytes Relative 9  3 - 12 %   Monocytes Absolute 0.6  0.1 - 1.0 K/uL   Eosinophils Relative 12 (*) 0 - 5 %   Eosinophils Absolute 0.7  0.0 - 0.7 K/uL   Basophils Relative  1  0 - 1 %   Basophils Absolute 0.1  0.0 - 0.1 K/uL   Comment: Performed at [facility]  CBC WITH DIFFERENTIAL     Status: Abnormal   Collection Time    04/28/14  6:21 AM      Result Value Ref Range   WBC 5.7  4.0 - 10.5 K/uL   RBC 3.95 (*) 4.22 - 5.81 MIL/uL   Hemoglobin 12.1 (*) 13.0 - 17.0 g/dL   HCT 35.5 (*) 39.0 - 52.0 %   MCV 89.9  78.0 - 100.0 fL   MCH 30.6  26.0 - 34.0 pg   MCHC 34.1  30.0 - 36.0 g/dL   RDW 12.0  11.5 - 15.5 %   Platelets 162  150 - 400 K/uL   Neutrophils Relative % 36 (*) 43 - 77 %   Neutro Abs 2.1  1.7 - 7.7 K/uL   Lymphocytes Relative 42  12 - 46 %   Lymphs Abs 2.4  0.7 - 4.0 K/uL   Monocytes Relative 9  3 - 12 %   Monocytes Absolute 0.5  0.1 - 1.0 K/uL   Eosinophils Relative 12 (*) 0 - 5 %   Eosinophils Absolute 0.7  0.0 - 0.7 K/uL   Basophils Relative 1  0 - 1 %   Basophils Absolute 0.0  0.0 - 0.1 K/uL   Comment: Performed at [facility]    Physical Findings: AIMS: Facial and Oral Movements Muscles of Facial Expression: Mild Lips and Perioral Area: Mild Jaw: Mild Tongue: Mild,Extremity Movements Upper (arms, wrists, hands, fingers): Minimal Lower (legs, knees, ankles, toes): None, normal, Trunk Movements Neck, shoulders, hips: Minimal, Overall Severity Severity of abnormal movements (highest score from questions above): Mild Incapacitation due to abnormal movements: Minimal Patient's awareness of abnormal movements (rate only patient's report): Aware, mild distress, Dental Status Current problems with teeth and/or dentures?: No Does patient usually wear dentures?: No  CIWA:    COWS:     Treatment Plan Summary: Daily contact with patient to assess and evaluate symptoms and  progress in treatment Medication management  Plan: 1. Continue crisis management and stabilization.  2. Medication management:  -Continue Klonopin 0.5 mg BID for anxiety  -Continue Clozaril 100 mg BID and 200 mg hs.   -Continue Prozac 20 mg daily for depression.  3. Encouraged patient to attend groups and participate in group counseling sessions and activities.  4. Discharge plan in progress. Anticipate discharge on Thursday.  5. Continue current treatment plan.  6. Address medical issues: Vitals stable. Continue Keppra for seizure management. Continue Synthroid 50 mcg for hypothyroidism that was started by Internal Medicine. Order CBC with diff daily for the next three days to monitor WBC count. Keppra level in am.   Medical Decision Making Problem Points:  Established problem, stable/improving (1), Review of last therapy session (1) and Review of psycho-social stressors (1) Data Points:  Order Aims Assessment (2) Review or order clinical lab tests (1) Review or order medicine tests (1) Review of medication regiment & side effects (2)  I certify that inpatient services furnished can reasonably be expected to improve the patient's condition.   Elmarie Shiley, NP-C 04/28/2014, 3:06 PM Agree with assessment and plan Woodroe Chen. Sabra Heck, M.D.

## 2014-04-28 NOTE — Progress Notes (Signed)
[facility] - 0900  The focus of this group is to educate the patient on the purpose and policies of crisis stabilization and provide a format to answer questions about their admission.  The group details unit policies and expectations of patients while admitted.  Patient did not attend. 

## 2014-04-28 NOTE — [facility] Group Notes (Signed)
[place] Group Notes:  (Counselor/Nursing/MHT/Case Management/Adjunct)  04/28/2014 1:15PM  Type of Therapy:  Group Therapy  Participation Level:  Active  Participation Quality:  Appropriate  Affect:  Flat  Cognitive:  Oriented  Insight:  Improving  Engagement in Group:  Limited  Engagement in Therapy:  Limited  Modes of Intervention:  Discussion, Exploration and Socialization  Summary of Progress/Problems: The topic for group was balance in life.  Pt participated in the discussion about when their life was in balance and out of balance and how this feels.  Pt discussed ways to get back in balance and short term goals they can work on to get where they want to be. This was the first group Brady Hoffman attended with me.  He initially said he did not want to come, so I was surprised when he walked into group just after we started.  "I don't know if I am unbalanced or balanced, so I guess i am neither."  Could not elaborate.  Also stated that he has found nothing that helps him move from unbalance to balance.  He spontaneously joined the conversation when the topic of Elvis being awarded an Psychologist, counselling in the [facility] by [place] came up.   Brady Hoffman 04/28/2014 1:10 PM

## 2014-04-28 NOTE — Progress Notes (Signed)
D: Pt has been compliant and cooperative today. However, Pt did not attend group sessions and has been isolated to room. Pt did complet Self Inventory sheet. Pt denies SI/HI and verbally contracts for safety.   A: Pt given emotional support from RN. Pt medication routine continued.  R: Pt remains cooperative. Will continue to monitor Q15MINS for safety.

## 2014-04-28 NOTE — Progress Notes (Signed)
Patient ID: Brady Hoffman, male   DOB: 07-08-1966, 48 y.o.   MRN: [id number] D: Patient in room most of the evening not interacting with peers. Pt mood and affect appeared depressed and flat. Pt denies SI/HI/AVH and pain. Cooperative with assessment. No acute distressed noted at this time.   A: Met with pt 1:1. Medications administered as prescribed. Writer encouraged pt to discuss feelings and interact more with peers. Pt encouraged to come to staff with any question or concerns.   R: Patient remains safe. He is complaint with medications and denies any adverse reaction. Continue current POC.

## 2014-04-29 LAB — CBC WITH DIFFERENTIAL/PLATELET
Basophils Absolute: 0 10*3/uL (ref 0.0–0.1)
Basophils Relative: 1 % (ref 0–1)
Eosinophils Absolute: 0.7 10*3/uL (ref 0.0–0.7)
Eosinophils Relative: 11 % — ABNORMAL HIGH (ref 0–5)
HCT: 33.4 % — ABNORMAL LOW (ref 39.0–52.0)
HEMOGLOBIN: 11.4 g/dL — AB (ref 13.0–17.0)
LYMPHS ABS: 2.3 10*3/uL (ref 0.7–4.0)
LYMPHS PCT: 40 % (ref 12–46)
MCH: 30.2 pg (ref 26.0–34.0)
MCHC: 34.1 g/dL (ref 30.0–36.0)
MCV: 88.6 fL (ref 78.0–100.0)
MONOS PCT: 9 % (ref 3–12)
Monocytes Absolute: 0.5 10*3/uL (ref 0.1–1.0)
NEUTROS PCT: 39 % — AB (ref 43–77)
Neutro Abs: 2.2 10*3/uL (ref 1.7–7.7)
Platelets: 170 10*3/uL (ref 150–400)
RBC: 3.77 MIL/uL — ABNORMAL LOW (ref 4.22–5.81)
RDW: 11.9 % (ref 11.5–15.5)
WBC: 5.7 10*3/uL (ref 4.0–10.5)

## 2014-04-29 LAB — CLOZAPINE (CLOZARIL)
Clozapine Lvl: 432 mcg/L
NORCLOZAPINE: 252 ug/L (ref 25–400)

## 2014-04-29 NOTE — Progress Notes (Signed)
Patient ID: Brady Hoffman, male   DOB: 07-31-1966, 48 y.o.   MRN: [id number] D. Patient presents with depressed, irritable mood, affect flat. Montell has been isolative to his room throughout most of shift, and has poor hygiene today, despite encouragement from staff. He remains quiet, guarded and minimally forthcoming, although he denies any acute concerns at this time. Currently he does appear preoccupied at times but does not elaborate with Probation officer. Patients mother Jana Half phoned, relaying her concerns as she reports '' I've noticed a change in my son and I don't want him to get worse. I'm worried that the clozaril dose was lowered too quickly because he told me he's starting to hear things and have intrusive bad thoughts again '' A. Medications given as ordered. Support and encouragement provided. Discussed above information with L . Davis Dr. C./treatment team.. R. Pt remains isolative to room at this time , but cooperative with redirection. Will continue to monitor q 15 minutes for safety.

## 2014-04-29 NOTE — [facility] Group Notes (Addendum)
[facility] LCSW Group Therapy  04/29/2014  1:05 PM  Type of Therapy:  Group therapy  Participation Level:  Active  Participation Quality:  Attentive  Affect:  Flat  Cognitive:  Oriented  Insight:  Limited  Engagement in Therapy:  Limited  Modes of Intervention:  Discussion, Socialization  Summary of Progress/Problems:  Chaplain was here to lead a group on themes of hope and courage.  "Hope is the belief that things will get better."  Stated that even when things are bleak, he remembers that it has worked out in the past, and it can again.  Sat quietly for the rest of group.  At the end, stated that he hopes everyone here is cured of their mental illness.  Roque Lias B 04/29/2014 11:12 AM

## 2014-04-29 NOTE — [facility] Group Notes (Signed)
[facility] LCSW Aftercare Discharge Planning Group Note   04/29/2014 10:49 AM  Participation Quality:  Did not attend    Brady Hoffman [name]   

## 2014-04-29 NOTE — Progress Notes (Signed)
[facility] Group Notes:  (Nursing/MHT/Case Management/Adjunct)  Date:  04/29/2014  Time:  11:33 PM  Type of Therapy:  Group Therapy  Participation Level:  Minimal  Participation Quality:  Appropriate  Affect:  Flat  Cognitive:  Lacking  Insight:  Good  Engagement in Group:  Developing/Improving  Modes of Intervention:  Socialization and Support  Summary of Progress/Problems: Pt. Stated he had a "pretty good day," because he was able to read a newspaper and had a good visit from mom.  Pt. Was unable to identify early warning signs for relapse.  Lanell Persons 04/29/2014, 11:33 PM

## 2014-04-29 NOTE — Progress Notes (Signed)
Patient ID: Brady Hoffman, male   DOB: 08-Sep-1966, 48 y.o.   MRN: [id number] [facility] MD Progress Note  04/29/2014 1:38 PM Thamas Kroening  MRN:  [id number] Subjective:   Reports he feels sedated, " sleepy"   Objective:  Patient seen and chart reviewed. I saw patient along with Ms. Rosana Hoes, NP, who has been seeing him daily. Report is that mother informed staff that patient is somewhat more irritable and demanding recently, and she has expressed concern that this might indicate some decompensation. As discussed with Nursing Staff he tends to be isolative on Unit, keeps to self, but is generally cooperative and not agitated.  His affect tends to be blunted. Upon admission  Reported significant anxiety, which has improved.  As discussed with staff, the concern recently is concerning WBC/ ANC, in the context of his Clozaril management . It should be noted that mother has been adamant that Clozaril is the medication that has worked the best for him and that has helped him stabilize, and she has expressed concern that a change of medication would likely result in clinical worsening. Today his WBC is 5.7 and his ANC is 2.2. This is essentially unchanged from most recent CBC.  Patient denies any sore throat, fever , chills, cough, SOB, cough, diarrhea, or other symptoms that might indicate infection. Patient has a history of seizure disorder for which he is on Keppra- he has had no seizures while admitted.   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  Hyperlipidemia    .  Schizoaffective disorder    .  H/O: GI bleed    .  Seizures    .  Depression    .  Chronic kidney disease    .  Anemia  01/14/2014   .  Anxiety attack     AXIS IV: housing problems, other psychosocial or environmental problems and problems related to social  environment  AXIS V: 41-50 Serious Symptoms   ADL's:  Fair, needs encouragement from staff to change/bathe  Sleep: Good- as noted, reports some sedation, which may be related to medication  Appetite:  Fair  Suicidal Ideation:  Denies Homicidal Ideation:  Denies AEB (as evidenced by):  Psychiatric Specialty Exam: Physical Exam  Review of Systems  Constitutional: Negative.  Negative for fever.  HENT: Negative.  Negative for congestion, ear pain and sore throat.   Eyes: Negative.   Respiratory: Negative.  Negative for cough and shortness of breath.   Cardiovascular: Negative.  Negative for chest pain.  Gastrointestinal: Negative.  Negative for diarrhea.  Genitourinary: Negative.   Musculoskeletal: Negative.   Skin: Negative.   Neurological: Negative.   Endo/Heme/Allergies: Negative.   Psychiatric/Behavioral: Positive for depression and hallucinations. Negative for suicidal ideas, memory loss and substance abuse. The patient is not nervous/anxious and does not have insomnia.     Blood pressure 105/72, pulse 85, temperature 97.6 F (36.4 C), temperature source Oral, resp. rate 18, height 6\' 3"  (1.905 m), weight 95.255 kg (210 lb), SpO2 99.00%.Body mass index is 26.25 kg/(m^2).  General Appearance: Disheveled  Eye Sport and exercise psychologist::  Fair  Speech:  Slowed   Volume:  Decreased  Mood:  Depressed   Affect:  Constricted  Thought Process:  Circumstantial  Orientation:  Full (Time, Place, and Person)  Thought Content:  Tends to be ruminative- describes  vague auditory hallucinations but does not elaborate- denies command halls, and does not appear internally preoccupied  Suicidal Thoughts:  No  Homicidal Thoughts:  No  Memory:  Immediate;   Fair Recent;   Poor Remote;   Poor  Judgement:  Impaired  Insight:  Lacking  Psychomotor Activity:  Decreased  Concentration:  Fair  Recall:  [place]: Fair  Akathisia:  No  Handed:  Right  AIMS (if indicated):      Assets:  Desire for improvement, mother very involved in his care  Sleep:  Number of Hours: 6.5   Musculoskeletal: Strength & Muscle Tone: within normal limits Gait & Station: normal Patient leans: N/A  Current Medications: Current Facility-Administered Medications  Medication Dose Route Frequency Provider Last Rate Last Dose  . acetaminophen (TYLENOL) tablet 650 mg  650 mg Oral Q6H PRN Benjamine Mola, FNP      . alum & mag hydroxide-simeth (MAALOX/MYLANTA) 200-200-20 MG/5ML suspension 30 mL  30 mL Oral Q4H PRN Benjamine Mola, FNP      . aspirin chewable tablet 81 mg  81 mg Oral Daily Lurena Nida, NP   81 mg at 04/29/14 0819  . benztropine (COGENTIN) tablet 2 mg  2 mg Oral QHS Lurena Nida, NP   2 mg at 04/28/14 2224  . clonazePAM (KLONOPIN) tablet 0.5 mg  0.5 mg Oral BID Mojeed Akintayo   0.5 mg at 04/29/14 0819  . cloZAPine (CLOZARIL) tablet 100 mg  100 mg Oral BID Lurena Nida, NP   100 mg at 04/29/14 0819  . cloZAPine (CLOZARIL) tablet 200 mg  200 mg Oral QHS Elmarie Shiley, NP   200 mg at 04/28/14 2224  . FLUoxetine (PROZAC) capsule 20 mg  20 mg Oral Daily Mojeed Akintayo   20 mg at 04/29/14 0818  . folic acid (FOLVITE) tablet 1 mg  1 mg Oral Daily Lurena Nida, NP   1 mg at 04/29/14 0818  . levETIRAcetam (KEPPRA XR) 24 hr tablet 500 mg  500 mg Oral BID Lurena Nida, NP   500 mg at 04/29/14 0818  . levothyroxine (SYNTHROID, LEVOTHROID) tablet 50 mcg  50 mcg Oral QAC breakfast Caren Griffins, MD   50 mcg at 04/29/14 [phone]  . magnesium hydroxide (MILK OF MAGNESIA) suspension 30 mL  30 mL Oral Daily PRN Benjamine Mola, FNP      . pantoprazole (PROTONIX) EC tablet 80 mg  80 mg Oral QAC lunch Caren Griffins, MD   80 mg at 04/29/14 1125    Lab Results:  Results for orders placed during the hospital encounter of 04/21/14 (from the past 48 hour(s))  CBC WITH DIFFERENTIAL     Status: Abnormal   Collection Time    04/28/14  6:21 AM      Result Value Ref Range   WBC 5.7  4.0 - 10.5  K/uL   RBC 3.95 (*) 4.22 - 5.81 MIL/uL   Hemoglobin 12.1 (*) 13.0 - 17.0 g/dL   HCT 35.5 (*) 39.0 - 52.0 %   MCV 89.9  78.0 - 100.0 fL   MCH 30.6  26.0 - 34.0 pg   MCHC 34.1  30.0 - 36.0 g/dL   RDW 12.0  11.5 - 15.5 %   Platelets 162  150 - 400 K/uL   Neutrophils Relative % 36 (*) 43 - 77 %   Neutro Abs 2.1  1.7 - 7.7 K/uL   Lymphocytes Relative 42  12 - 46 %   Lymphs Abs 2.4  0.7 - 4.0 K/uL   Monocytes Relative 9  3 - 12 %   Monocytes Absolute 0.5  0.1 - 1.0 K/uL   Eosinophils Relative 12 (*) 0 - 5 %   Eosinophils Absolute 0.7  0.0 - 0.7 K/uL   Basophils Relative 1  0 - 1 %   Basophils Absolute 0.0  0.0 - 0.1 K/uL   Comment: Performed at [place]     Status: Abnormal   Collection Time    04/29/14  6:40 AM      Result Value Ref Range   WBC 5.7  4.0 - 10.5 K/uL   RBC 3.77 (*) 4.22 - 5.81 MIL/uL   Hemoglobin 11.4 (*) 13.0 - 17.0 g/dL   HCT 33.4 (*) 39.0 - 52.0 %   MCV 88.6  78.0 - 100.0 fL   MCH 30.2  26.0 - 34.0 pg   MCHC 34.1  30.0 - 36.0 g/dL   RDW 11.9  11.5 - 15.5 %   Platelets 170  150 - 400 K/uL   Neutrophils Relative % 39 (*) 43 - 77 %   Neutro Abs 2.2  1.7 - 7.7 K/uL   Lymphocytes Relative 40  12 - 46 %   Lymphs Abs 2.3  0.7 - 4.0 K/uL   Monocytes Relative 9  3 - 12 %   Monocytes Absolute 0.5  0.1 - 1.0 K/uL   Eosinophils Relative 11 (*) 0 - 5 %   Eosinophils Absolute 0.7  0.0 - 0.7 K/uL   Basophils Relative 1  0 - 1 %   Basophils Absolute 0.0  0.0 - 0.1 K/uL   Comment: Performed at [facility]    Physical Findings: AIMS: Facial and Oral Movements Muscles of Facial Expression: Mild Lips and Perioral Area: Mild Jaw: Mild Tongue: Mild,Extremity Movements Upper (arms, wrists, hands, fingers): Minimal Lower (legs, knees, ankles, toes): None, normal, Trunk Movements Neck, shoulders, hips: Minimal, Overall Severity Severity of abnormal movements (highest score from questions above):  Mild Incapacitation due to abnormal movements: Minimal Patient's awareness of abnormal movements (rate only patient's report): Aware, mild distress, Dental Status Current problems with teeth and/or dentures?: No Does patient usually wear dentures?: No  CIWA:    COWS:     Treatment Plan Summary: Daily contact with patient to assess and evaluate symptoms and progress in treatment Medication management  Plan: 1. Continue crisis management and stabilization.  2. Medication management:  -Continue Klonopin 0.5 mg BID for anxiety  -Continue Clozaril 100 mg BID and 200 mg hs. ( Of note, his admission Clozaril dose was 600 mgrs total daily dose, which was decreased due to elevated blood level)    -Continue Prozac 20 mg daily for depression.  - Will taper down Cogentin, as it is unlikely Clozapine would cause significant EPS, and cogentin may be contributing to sedation 3. Encouraged patient to attend groups and participate in group counseling sessions and activities.  4. Continue current treatment plan.  6.Continue Keppra for seizure management. Continue Synthroid 50 mcg for hypothyroidism that was started by Internal Medicine. Order CBC with diff daily for the next three days to monitor WBC count. A Keppra and Clozaril levels have been ordered and are pending  Medical Decision Making Problem Points:  Established problem, stable/improving (1), Review of last therapy session (1) and Review of psycho-social stressors (1) Data Points:  Order Aims Assessment (2) Review or  order clinical lab tests (1) Review or order medicine tests (1) Review of medication regiment & side effects (2)  I certify that inpatient services furnished can reasonably be expected to improve the patient's condition.   Neita Garnet, MD Psychiatrist 04/29/2014 2:11 PM

## 2014-04-29 NOTE — Progress Notes (Signed)
D.  Pt in bed upon approach, states that he normally takes his bedtime medication at 2000.  No complaints voiced.  Positive for evening wrap up group, interacting appropriately within milieu.  Denies SI/HI/hallucinations at this time.  A.  Support and encouragement offered, medications given as requested.  R.  Pt remains safe on unit, will continue to monitor.

## 2014-04-29 NOTE — Progress Notes (Signed)
Adult Psychoeducational Group Note  Date:  04/29/2014 Time:  10:00AM  Group Topic/Focus:  Relapse Prevention Planning:   The focus of this group is to define relapse and discuss the need for planning to combat relapse.  Participation Level:  Did Not Attend  Additional Comments:   Patient opted not to attend the group session.   Ancil Linsey 04/29/2014, 12:38 PM

## 2014-04-30 LAB — CBC WITH DIFFERENTIAL/PLATELET
BASOS ABS: 0 10*3/uL (ref 0.0–0.1)
Basophils Relative: 0 % (ref 0–1)
EOS PCT: 11 % — AB (ref 0–5)
Eosinophils Absolute: 0.6 10*3/uL (ref 0.0–0.7)
HEMATOCRIT: 33.1 % — AB (ref 39.0–52.0)
HEMOGLOBIN: 11.2 g/dL — AB (ref 13.0–17.0)
LYMPHS ABS: 2.2 10*3/uL (ref 0.7–4.0)
LYMPHS PCT: 40 % (ref 12–46)
MCH: 30.3 pg (ref 26.0–34.0)
MCHC: 33.8 g/dL (ref 30.0–36.0)
MCV: 89.5 fL (ref 78.0–100.0)
MONOS PCT: 9 % (ref 3–12)
Monocytes Absolute: 0.5 10*3/uL (ref 0.1–1.0)
NEUTROS ABS: 2.3 10*3/uL (ref 1.7–7.7)
Neutrophils Relative %: 40 % — ABNORMAL LOW (ref 43–77)
Platelets: 157 10*3/uL (ref 150–400)
RBC: 3.7 MIL/uL — ABNORMAL LOW (ref 4.22–5.81)
RDW: 12 % (ref 11.5–15.5)
WBC: 5.5 10*3/uL (ref 4.0–10.5)

## 2014-04-30 NOTE — Progress Notes (Signed)
[place] Group Notes:  (Nursing/MHT/Case Management/Adjunct)  Date:  04/30/2014  Time:  9:03 PM  Type of Therapy:  Psychoeducational Skills  Participation Level:  Minimal  Participation Quality:  Attentive  Affect:  Depressed  Cognitive:  Lacking  Insight:  None  Engagement in Group:  Limited  Modes of Intervention:  Education  Summary of Progress/Problems: The patient shared with the group that he had a good day but was unable to provide any additional details. His speech was difficult to decipher and he made mention that he didn't attend groups today. As for the theme of the day, his coping skill will be to work on [employer].   Gennette Pac 04/30/2014, 9:03 PM

## 2014-04-30 NOTE — [facility] Group Notes (Signed)
[place] Group Notes: (Clinical Social Work)   04/30/2014      Type of Therapy:  Group Therapy   Participation Level:  Did Not Attend    Selmer Dominion, LCSW 04/30/2014, 12:46 PM

## 2014-04-30 NOTE — Progress Notes (Signed)
Patient ID: Brady Hoffman, male   DOB: 1966/07/11, 48 y.o.   MRN: [id number] Psychoeducational Group Note  Date:  04/30/2014 Time:  0910  Group Topic/Focus:  inventory group   Participation Level: Did Not Attend  Participation Quality:  Not Applicable  Affect:  Not Applicable  Cognitive:  Not Applicable  Insight:  Not Applicable  Engagement in Group: Not Applicable  Additional Comments:  Did not attend.   [name] 04/30/2014, 9:40 AM

## 2014-04-30 NOTE — Progress Notes (Signed)
Patient ID: Brady Hoffman, male   DOB: [birth year], 48 y.o.   MRN: [id number] [facility] MD Progress Note  04/30/2014 6:22 PM Brady Hoffman  MRN:  [id number] Subjective:    He is very sleepy and not interested to talk now. Mood is sad now.  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  Hyperlipidemia    .  Schizoaffective disorder    .  H/O: GI bleed    .  Seizures    .  Depression    .  Chronic kidney disease    .  Anemia  01/14/2014   .  Anxiety attack     AXIS IV: housing problems, other psychosocial or environmental problems and problems related to social environment  AXIS V: 41-50 Serious Symptoms   ADL's:  Fair, needs encouragement from staff to change/bathe  Sleep: Good- as noted, reports some sedation, which may be related to medication  Appetite:  Fair  Suicidal Ideation:  Denies Homicidal Ideation:  Denies AEB (as evidenced by):  Psychiatric Specialty Exam: Physical Exam  Review of Systems  Constitutional: Negative.  Negative for fever.  HENT: Negative.  Negative for congestion, ear pain and sore throat.   Eyes: Negative.   Respiratory: Negative.  Negative for cough and shortness of breath.   Cardiovascular: Negative.  Negative for chest pain.  Gastrointestinal: Negative.  Negative for diarrhea.  Genitourinary: Negative.   Musculoskeletal: Negative.   Skin: Negative.   Neurological: Negative.   Endo/Heme/Allergies: Negative.   Psychiatric/Behavioral: Positive for depression and hallucinations. Negative for suicidal ideas, memory loss and substance abuse. The patient is not nervous/anxious and does not have insomnia.     Blood pressure 101/68, pulse 88, temperature 97 F (36.1 C), temperature source Oral, resp. rate 18, height 6\' 3"  (1.905 m), weight 95.255 kg (210 lb),  SpO2 99.00%.Body mass index is 26.25 kg/(m^2).  General Appearance: Disheveled  Eye Sport and exercise psychologist::  Fair  Speech:  Slowed   Volume:  Decreased  Mood:  Depressed   Affect:  Constricted  Thought Process:  Circumstantial  Orientation:  Full (Time, Place, and Person)  Thought Content: denies avh  Suicidal Thoughts:  No  Homicidal Thoughts:  No  Memory:  Immediate;   Fair Recent;   Poor Remote;   Poor  Judgement:  Impaired  Insight:  Lacking  Psychomotor Activity:  Decreased  Concentration:  Fair  Recall:  [place]: Fair  Akathisia:  No  Handed:  Right  AIMS (if indicated):     Assets:  Desire for improvement, mother very involved in his care  Sleep:  Number of Hours: 6.25   Musculoskeletal: Strength & Muscle Tone: within normal limits Gait & Station: normal Patient leans: N/A  Current Medications: Current Facility-Administered Medications  Medication Dose Route Frequency Provider Last Rate Last Dose  . acetaminophen (TYLENOL) tablet 650 mg  650 mg Oral Q6H PRN Benjamine Mola, FNP      . alum & mag hydroxide-simeth (MAALOX/MYLANTA) 200-200-20 MG/5ML suspension 30 mL  30 mL Oral Q4H PRN Benjamine Mola, FNP      . aspirin chewable tablet 81 mg  81 mg Oral Daily Lurena Nida, NP   81 mg at 04/30/14 0755  . benztropine (COGENTIN) tablet 2 mg  2  mg Oral QHS Lurena Nida, NP   2 mg at 04/29/14 2005  . clonazePAM (KLONOPIN) tablet 0.5 mg  0.5 mg Oral BID Mojeed Akintayo   0.5 mg at 04/30/14 1654  . cloZAPine (CLOZARIL) tablet 100 mg  100 mg Oral BID Lurena Nida, NP   100 mg at 04/30/14 1654  . cloZAPine (CLOZARIL) tablet 200 mg  200 mg Oral QHS Elmarie Shiley, NP   200 mg at 04/29/14 2005  . FLUoxetine (PROZAC) capsule 20 mg  20 mg Oral Daily Mojeed Akintayo   20 mg at 04/30/14 0755  . folic acid (FOLVITE) tablet 1 mg  1 mg Oral Daily Lurena Nida, NP   1 mg at 04/30/14 0755  . levETIRAcetam (KEPPRA XR) 24 hr tablet 500 mg  500 mg Oral BID Lurena Nida, NP    500 mg at 04/30/14 1654  . levothyroxine (SYNTHROID, LEVOTHROID) tablet 50 mcg  50 mcg Oral QAC breakfast Caren Griffins, MD   50 mcg at 04/30/14 [phone]  . magnesium hydroxide (MILK OF MAGNESIA) suspension 30 mL  30 mL Oral Daily PRN Benjamine Mola, FNP      . pantoprazole (PROTONIX) EC tablet 80 mg  80 mg Oral QAC lunch Caren Griffins, MD   80 mg at 04/30/14 1200    Lab Results:  Results for orders placed during the hospital encounter of 04/21/14 (from the past 48 hour(s))  CBC WITH DIFFERENTIAL     Status: Abnormal   Collection Time    04/29/14  6:40 AM      Result Value Ref Range   WBC 5.7  4.0 - 10.5 K/uL   RBC 3.77 (*) 4.22 - 5.81 MIL/uL   Hemoglobin 11.4 (*) 13.0 - 17.0 g/dL   HCT 33.4 (*) 39.0 - 52.0 %   MCV 88.6  78.0 - 100.0 fL   MCH 30.2  26.0 - 34.0 pg   MCHC 34.1  30.0 - 36.0 g/dL   RDW 11.9  11.5 - 15.5 %   Platelets 170  150 - 400 K/uL   Neutrophils Relative % 39 (*) 43 - 77 %   Neutro Abs 2.2  1.7 - 7.7 K/uL   Lymphocytes Relative 40  12 - 46 %   Lymphs Abs 2.3  0.7 - 4.0 K/uL   Monocytes Relative 9  3 - 12 %   Monocytes Absolute 0.5  0.1 - 1.0 K/uL   Eosinophils Relative 11 (*) 0 - 5 %   Eosinophils Absolute 0.7  0.0 - 0.7 K/uL   Basophils Relative 1  0 - 1 %   Basophils Absolute 0.0  0.0 - 0.1 K/uL   Comment: Performed at [facility]     Status: Abnormal   Collection Time    04/30/14  6:30 AM      Result Value Ref Range   WBC 5.5  4.0 - 10.5 K/uL   RBC 3.70 (*) 4.22 - 5.81 MIL/uL   Hemoglobin 11.2 (*) 13.0 - 17.0 g/dL   HCT 33.1 (*) 39.0 - 52.0 %   MCV 89.5  78.0 - 100.0 fL   MCH 30.3  26.0 - 34.0 pg   MCHC 33.8  30.0 - 36.0 g/dL   RDW 12.0  11.5 - 15.5 %   Platelets 157  150 - 400 K/uL   Neutrophils Relative % 40 (*) 43 - 77 %   Neutro Abs 2.3  1.7 - 7.7 K/uL  Lymphocytes Relative 40  12 - 46 %   Lymphs Abs 2.2  0.7 - 4.0 K/uL   Monocytes Relative 9  3 - 12 %   Monocytes Absolute 0.5  0.1 - 1.0 K/uL    Eosinophils Relative 11 (*) 0 - 5 %   Eosinophils Absolute 0.6  0.0 - 0.7 K/uL   Basophils Relative 0  0 - 1 %   Basophils Absolute 0.0  0.0 - 0.1 K/uL   Comment: Performed at [facility]    Physical Findings: AIMS: Facial and Oral Movements Muscles of Facial Expression: Mild Lips and Perioral Area: Mild Jaw: Mild Tongue: Mild,Extremity Movements Upper (arms, wrists, hands, fingers): Minimal Lower (legs, knees, ankles, toes): None, normal, Trunk Movements Neck, shoulders, hips: Minimal, Overall Severity Severity of abnormal movements (highest score from questions above): Mild Incapacitation due to abnormal movements: Minimal Patient's awareness of abnormal movements (rate only patient's report): Aware, mild distress, Dental Status Current problems with teeth and/or dentures?: No Does patient usually wear dentures?: No  CIWA:    COWS:     Treatment Plan Summary: Daily contact with patient to assess and evaluate symptoms and progress in treatment Medication management  Plan: 1. Continue crisis management and stabilization.  2. Medication management:  -continue current meds  Medical Decision Making Problem Points:  Established problem, stable/improving (1), Review of last therapy session (1) and Review of psycho-social stressors (1) Data Points:  Order Aims Assessment (2) Review or order clinical lab tests (1) Review or order medicine tests (1) Review of medication regiment & side effects (2)  I certify that inpatient services furnished can reasonably be expected to improve the patient's condition.

## 2014-04-30 NOTE — Progress Notes (Signed)
Patient ID: Brady Hoffman, male   DOB: August 30, 1966, 48 y.o.   MRN: [id number] D. Patient presents with depressed mood, affect flat again today. Clerance has been isolative to his room throughout most of shift, and has poor hygiene again today, despite encouragement from staff. He remains quiet, guarded and minimally forthcoming, although he denies any acute concerns, or auditory /visual hallucinations. He has been compliant with medications, but remains minimally interactive on the unit. A. Medications given as ordered. Support and encouragement provided. Discussed above information with Dr. Lavona Mound NP. R. Pt remains isolative to room at this time , but cooperative with redirection. Will continue to monitor q 15 minutes for safety.

## 2014-04-30 NOTE — Progress Notes (Signed)
Patient ID: Brady Hoffman, male   DOB: 1966/04/05, 48 y.o.   MRN: [id number] Psychoeducational Group Note  Date:  04/30/2014 Time:  0930  Group Topic/Focus:  healthy coping skills.   Participation Level: Did Not Attend  Participation Quality:  Not Applicable  Affect:  Not Applicable  Cognitive:  Not Applicable  Insight:  Not Applicable  Engagement in Group: Not Applicable  Additional Comments:  Did not attend.   [name] 04/30/2014, 9:41 AM

## 2014-05-01 NOTE — Progress Notes (Signed)
Patient ID: Brady Hoffman, male   DOB: [birth year], 48 y.o.   MRN: [id number] Psychoeducational Group Note  Date:  05/01/2014 Time:  0930  Group Topic/Focus:  healthy support systems.   Participation Level: Did Not Attend  Participation Quality:  Not Applicable  Affect:  Not Applicable  Cognitive:  Not Applicable  Insight:  Not Applicable  Engagement in Group: Not Applicable  Additional Comments:  Did not attend.   [name] 05/01/2014, 12:50 PM

## 2014-05-01 NOTE — Progress Notes (Signed)
Pt remains flat and depressed though states he feels his mood has and is improving. Still keeps to self and has downward gaze at times. No physical complaints. Medicated per orders. Support and encouragement given. He denies SI/HI/AVH and remains safe. Brady Hoffman Brady Hoffman

## 2014-05-01 NOTE — Progress Notes (Signed)
Psychoeducational Group Note  Date:  05/01/2014 Time:  0910  Group Topic/Focus:  inventory  group   Participation Level: Did Not Attend  Participation Quality:  Not Applicable  Affect:  Not Applicable  Cognitive:  Not Applicable  Insight:  Not Applicable  Engagement in Group: Not Applicable  Additional Comments:  Did not attend.   [name] 05/01/2014, 12:49 PM

## 2014-05-01 NOTE — Progress Notes (Signed)
Patient ID: Brady Hoffman, male   DOB: 1966/03/18, 48 y.o.   MRN: [id number] [facility] MD Progress Note  05/01/2014 5:07 PM Fouad Taul  MRN:  [id number] Subjective:    He is very sleepy again today. Mood is still sad. Reports anxiety attacks at times.  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  Hyperlipidemia    .  Schizoaffective disorder    .  H/O: GI bleed    .  Seizures    .  Depression    .  Chronic kidney disease    .  Anemia  01/14/2014   .  Anxiety attack     AXIS IV: housing problems, other psychosocial or environmental problems and problems related to social environment  AXIS V: 41-50 Serious Symptoms   ADL's:  Fair, needs encouragement from staff to change/bathe  Sleep: Good- as noted, reports some sedation, which may be related to medication  Appetite:  Fair  Suicidal Ideation:  Denies Homicidal Ideation:  Denies AEB (as evidenced by):  Psychiatric Specialty Exam: Physical Exam  Review of Systems  Constitutional: Negative.  Negative for fever.  HENT: Negative.  Negative for congestion, ear pain and sore throat.   Eyes: Negative.   Respiratory: Negative.  Negative for cough and shortness of breath.   Cardiovascular: Negative.  Negative for chest pain.  Gastrointestinal: Negative.  Negative for diarrhea.  Genitourinary: Negative.   Musculoskeletal: Negative.   Skin: Negative.   Neurological: Negative.   Endo/Heme/Allergies: Negative.   Psychiatric/Behavioral: Positive for depression and hallucinations. Negative for suicidal ideas, memory loss and substance abuse. The patient is not nervous/anxious and does not have insomnia.     Blood pressure 105/70, pulse 85, temperature 97.9 F (36.6 C), temperature source Oral, resp. rate 18, height 6\' 3"  (1.905 m), weight  95.255 kg (210 lb), SpO2 99.00%.Body mass index is 26.25 kg/(m^2).  General Appearance: Disheveled  Eye Sport and exercise psychologist::  Fair  Speech:  Slowed   Volume:  Decreased  Mood:  Depressed   Affect:  Constricted  Thought Process:  Circumstantial  Orientation:  Full (Time, Place, and Person)  Thought Content: denies avh  Suicidal Thoughts:  No  Homicidal Thoughts:  No  Memory:  Immediate;   Fair Recent;   Poor Remote;   Poor  Judgement:  Impaired  Insight:  Lacking  Psychomotor Activity:  Decreased  Concentration:  Fair  Recall:  [place]: Fair  Akathisia:  No  Handed:  Right  AIMS (if indicated):     Assets:  Desire for improvement, mother very involved in his care  Sleep:  Number of Hours: 6.5   Musculoskeletal: Strength & Muscle Tone: within normal limits Gait & Station: normal Patient leans: N/A  Current Medications: Current Facility-Administered Medications  Medication Dose Route Frequency Provider Last Rate Last Dose  . acetaminophen (TYLENOL) tablet 650 mg  650 mg Oral Q6H PRN Benjamine Mola, FNP      . alum & mag hydroxide-simeth (MAALOX/MYLANTA) 200-200-20 MG/5ML suspension 30 mL  30 mL Oral Q4H PRN Benjamine Mola, FNP      . aspirin chewable tablet 81 mg  81 mg Oral Daily Lurena Nida, NP   81 mg at 05/01/14 0815  . benztropine (COGENTIN) tablet 2 mg  2 mg Oral QHS Lurena Nida, NP   2 mg at 04/30/14 2139  . clonazePAM (KLONOPIN) tablet 0.5 mg  0.5 mg Oral BID Mojeed Akintayo   0.5 mg at 05/01/14 1651  . cloZAPine (CLOZARIL) tablet 100 mg  100 mg Oral BID Lurena Nida, NP   100 mg at 05/01/14 1651  . cloZAPine (CLOZARIL) tablet 200 mg  200 mg Oral QHS Elmarie Shiley, NP   200 mg at 04/30/14 2139  . FLUoxetine (PROZAC) capsule 20 mg  20 mg Oral Daily Mojeed Akintayo   20 mg at 05/01/14 0814  . folic acid (FOLVITE) tablet 1 mg  1 mg Oral Daily Lurena Nida, NP   1 mg at 05/01/14 0815  . levETIRAcetam (KEPPRA XR) 24 hr tablet 500 mg  500 mg Oral  BID Lurena Nida, NP   500 mg at 05/01/14 1651  . levothyroxine (SYNTHROID, LEVOTHROID) tablet 50 mcg  50 mcg Oral QAC breakfast Caren Griffins, MD   50 mcg at 05/01/14 0608  . magnesium hydroxide (MILK OF MAGNESIA) suspension 30 mL  30 mL Oral Daily PRN Benjamine Mola, FNP      . pantoprazole (PROTONIX) EC tablet 80 mg  80 mg Oral QAC lunch Caren Griffins, MD   80 mg at 05/01/14 1205    Lab Results:  Results for orders placed during the hospital encounter of 04/21/14 (from the past 48 hour(s))  CBC WITH DIFFERENTIAL     Status: Abnormal   Collection Time    04/30/14  6:30 AM      Result Value Ref Range   WBC 5.5  4.0 - 10.5 K/uL   RBC 3.70 (*) 4.22 - 5.81 MIL/uL   Hemoglobin 11.2 (*) 13.0 - 17.0 g/dL   HCT 33.1 (*) 39.0 - 52.0 %   MCV 89.5  78.0 - 100.0 fL   MCH 30.3  26.0 - 34.0 pg   MCHC 33.8  30.0 - 36.0 g/dL   RDW 12.0  11.5 - 15.5 %   Platelets 157  150 - 400 K/uL   Neutrophils Relative % 40 (*) 43 - 77 %   Neutro Abs 2.3  1.7 - 7.7 K/uL   Lymphocytes Relative 40  12 - 46 %   Lymphs Abs 2.2  0.7 - 4.0 K/uL   Monocytes Relative 9  3 - 12 %   Monocytes Absolute 0.5  0.1 - 1.0 K/uL   Eosinophils Relative 11 (*) 0 - 5 %   Eosinophils Absolute 0.6  0.0 - 0.7 K/uL   Basophils Relative 0  0 - 1 %   Basophils Absolute 0.0  0.0 - 0.1 K/uL   Comment: Performed at [facility]    Physical Findings: AIMS: Facial and Oral Movements Muscles of Facial Expression: Mild Lips and Perioral Area: Mild Jaw: Mild Tongue: Mild,Extremity Movements Upper (arms, wrists, hands, fingers): Minimal Lower (legs, knees, ankles, toes): None, normal, Trunk Movements Neck, shoulders, hips: Minimal, Overall Severity Severity of abnormal movements (highest score from questions above): Mild Incapacitation due to abnormal movements: Minimal Patient's awareness of abnormal movements (rate only patient's report): Aware, mild distress, Dental Status Current problems with teeth and/or  dentures?: No Does patient usually wear dentures?: No  CIWA:    COWS:     Treatment Plan Summary: Daily contact with patient to assess and evaluate symptoms and progress in treatment Medication management  Plan: 1. Continue crisis management and stabilization.  2. Medication management:  -  continue current meds  Medical Decision Making Problem Points:  Established problem, stable/improving (1), Review of last therapy session (1) and Review of psycho-social stressors (1) Data Points:  Order Aims Assessment (2) Review or order clinical lab tests (1) Review or order medicine tests (1) Review of medication regiment & side effects (2)  I certify that inpatient services furnished can reasonably be expected to improve the patient's condition.

## 2014-05-01 NOTE — Progress Notes (Addendum)
Pt has had a good evening. Still keeps to self and forwards minimal information however is pleasant and cooperative. Affect flat, mood depressed. Denies any physical problems. No signs or symptoms of infection. Medicated per orders. Support and encouragement given. He denies SI/HI/AVH and remains safe. Junius Creamer Tommi Rumps

## 2014-05-01 NOTE — B[facility] Group Notes (Signed)
[place] Group Notes: (Clinical Social Work)   05/01/2014      Type of Therapy:  Group Therapy   Participation Level:  Did Not Attend    Selmer Dominion, LCSW 05/01/2014, 12:45 PM

## 2014-05-01 NOTE — Progress Notes (Signed)
[place] Group Notes:  (Nursing/MHT/Case Management/Adjunct)  Date:  05/01/2014  Time:  10:06 PM  Type of Therapy:  Psychoeducational Skills  Participation Level:  Active  Participation Quality:  Attentive  Affect:  Flat  Cognitive:  Appropriate  Insight:  Appropriate  Engagement in Group:  Limited  Modes of Intervention:  Education  Summary of Progress/Problems: The patient continues to share very little in group. The patient stated that he spent some time in the dayroom and that he attended the groups. He also mentioned that he spoke with his doctor today. The patient did not elaborate any further. In terms of the theme for the day, he indicated that the group home staff will be his support system.   Gennette Pac 05/01/2014, 10:06 PM

## 2014-05-02 LAB — LEVETIRACETAM LEVEL: LEVETIRACETAM: 11.8 ug/mL

## 2014-05-02 MED ORDER — CLONAZEPAM 0.5 MG PO TABS
0.5000 mg | ORAL_TABLET | Freq: Every day | ORAL | Status: DC
  Administered 2014-05-03: 0.5 mg via ORAL
  Filled 2014-05-02: qty 1

## 2014-05-02 NOTE — [facility] Group Notes (Signed)
[place] LCSW Group Therapy  05/02/2014 1:15 pm  Type of Therapy: Process Group Therapy  Participation Level:  Minimal  Participation Quality:  Appropriate  Affect:  Flat  Cognitive:  Oriented  Insight:  Limited  Engagement in Group:  Limited  Engagement in Therapy:  Limited  Modes of Intervention:  Activity, Clarification, Education, Problem-solving and Support  Summary of Progress/Problems: Today's group addressed the issue of overcoming obstacles.  Patients were asked to identify their biggest obstacle post d/c that stands in the way of their on-going success, and then problem solve as to how to manage this.  Robertson came late to group.  He paid attention while with [place].  He stated he has no challenges.  He likes where he is living, and gets along with everyone.  Roque Lias B 05/02/2014   1:06 PM

## 2014-05-02 NOTE — Progress Notes (Signed)
Patient ID: Brady Hoffman, male   DOB: 1966/03/24, 48 y.o.   MRN: [id number] D. Patient presents with depressed mood, affect flat again today. [name] has been isolative to his room throughout most of shift he reports, he is doing well. He states '' I'm just tired. '' Patient remains in bed, resting throughout most of am.  He has been compliant with medications, but remains minimally interactive on the unit. He denies any acute concerns. A. Medications given as ordered. Support and encouragement provided. Discussed above information with [name] treatment team. R. Pt remains isolative to room at this time , but cooperative with redirection. Will continue to monitor q 15 minutes for safety.

## 2014-05-02 NOTE — Care Management Utilization Note (Signed)
   Per State Regulation 482.30  This chart was reviewed for necessity with respect to the patient's Admission/ Duration of stay.  Next review date: 05/04/14  Skipper Cliche  RN, BSN

## 2014-05-02 NOTE — Tx Team (Signed)
  Interdisciplinary Treatment Plan Update   Date Reviewed:  05/02/2014  Time Reviewed:  8:30 AM  Progress in Treatment:   Attending groups: Sporadically Participating in groups: Minimally Taking medication as prescribed: Yes  Tolerating medication: Yes Family/Significant other contact made: Yes  Patient understands diagnosis: Yes  Discussing patient identified problems/goals with staff: Yes Medical problems stabilized or resolved: Yes Denies suicidal/homicidal ideation: Yes Patient has not harmed self or others: Yes  For review of initial/current patient goals, please see plan of care.  Estimated Length of Stay:  2-3 days  Reason for Continuation of Hospitalization: Anxiety Medical Issues Medication stabilization  New Problems/Goals identified:  N/A  Discharge Plan or Barriers:   return to [facility], follow up at Dr Marquis Buggy office  Additional Comments:  "I have been sleeping a lot during the day. My panic attacks and anxiety are reduced now.''  Ulrich is reporting excessive daytime sleepiness. However, he has been endorsing decreased depressive symptoms except for low energy level and lack of motivation. Patient reports decreased psychosis and delusions and denies suicidal/homicidal ideations, intent or plan. Patient is on Clozapine and his current WBC is 5.5 and ANC is 2.3, indicating a rising value from the last few days reports. Klonopin reduced to QHS dosage only starting tomorrow.   Attendees:  Signature: Corena Pilgrim, MD 05/02/2014 8:30 AM   Signature: Ripley Fraise, LCSW 05/02/2014 8:30 AM  Signature: Elmarie Shiley, NP 05/02/2014 8:30 AM  Signature: Mayra Neer, RN 05/02/2014 8:30 AM  Signature: Darrol Angel, RN 05/02/2014 8:30 AM  Signature:  05/02/2014 8:30 AM  Signature:   05/02/2014 8:30 AM  Signature:    Signature:    Signature:    Signature:    Signature:    Signature:      Scribe for Treatment Team:   Ripley Fraise, LCSW  05/02/2014 8:30 AM

## 2014-05-02 NOTE — Progress Notes (Signed)
Patient ID: Brady Hoffman, male   DOB: 1966-06-19, 48 y.o.   MRN: [id number] [facility] MD Progress Note  05/02/2014 11:04 AM Ciel Yanes  MRN:  [id number]  Subjective:   "I have been sleeping a lot during the day. My panic attacks and anxiety is reduced now.''  Objective: Patient is seen and chart is reviewed. He is reporting excessive daytime sleepiness. However, he has been endorsing decreased depressive symptoms except for low energy level and lack of motivation. Patient reports decreased psychosis and delusions and denies suicidal/homicidal ideations, intent or plan. Patient is on Clozapine and his current WBC is 5.5 and ANC is 2.3, indicating a rising value from the last few days reports.   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  Hyperlipidemia    .  Schizoaffective disorder    .  H/O: GI bleed    .  Seizures    .  Depression    .  Chronic kidney disease    .  Anemia  01/14/2014   .  Anxiety attack     AXIS IV: housing problems, other psychosocial or environmental problems and problems related to social environment  AXIS V: 41-50 Serious Symptoms   ADL's:  Fair, needs encouragement from staff to change/bathe  Sleep: Good- as noted, reports some sedation, which may be related to medication  Appetite:  Fair  Suicidal Ideation:  Denies Homicidal Ideation:  Denies AEB (as evidenced by):  Psychiatric Specialty Exam: Physical Exam  Review of Systems  Constitutional: Negative.  Negative for fever.  HENT: Negative.  Negative for congestion, ear pain and sore throat.   Eyes: Negative.   Respiratory: Negative.  Negative for cough and shortness of breath.   Cardiovascular: Negative.  Negative for chest pain.  Gastrointestinal: Negative.  Negative for diarrhea.  Genitourinary: Negative.    Musculoskeletal: Negative.   Skin: Negative.   Neurological: Negative.   Endo/Heme/Allergies: Negative.   Psychiatric/Behavioral: Positive for depression and hallucinations. Negative for suicidal ideas, memory loss and substance abuse. The patient is not nervous/anxious and does not have insomnia.     Blood pressure 96/69, pulse 94, temperature 97.9 F (36.6 C), temperature source Oral, resp. rate 16, height 6\' 3"  (1.905 m), weight 95.255 kg (210 lb), SpO2 99.00%.Body mass index is 26.25 kg/(m^2).  General Appearance: Disheveled  Eye Sport and exercise psychologist::  Fair  Speech:  Slowed   Volume:  Decreased  Mood:  Depressed   Affect:  Constricted  Thought Process:  Circumstantial  Orientation:  Full (Time, Place, and Person)  Thought Content: denies avh  Suicidal Thoughts:  No  Homicidal Thoughts:  No  Memory:  Immediate;   Fair Recent;   Poor Remote;   Poor  Judgement:  Impaired  Insight:  Lacking  Psychomotor Activity:  Decreased  Concentration:  Fair  Recall:  [place]: Fair  Akathisia:  No  Handed:  Right  AIMS (if indicated):     Assets:  Desire for improvement, mother very involved in his care  Sleep:  Number of Hours: 6.75   Musculoskeletal: Strength & Muscle Tone: within normal limits Gait & Station: normal Patient leans: N/A  Current Medications: Current Facility-Administered Medications  Medication Dose Route Frequency Provider Last Rate Last Dose  . acetaminophen (TYLENOL) tablet 650 mg  650 mg Oral  Q6H PRN Benjamine Mola, FNP      . alum & mag hydroxide-simeth (MAALOX/MYLANTA) 200-200-20 MG/5ML suspension 30 mL  30 mL Oral Q4H PRN Benjamine Mola, FNP      . aspirin chewable tablet 81 mg  81 mg Oral Daily Lurena Nida, NP   81 mg at 05/02/14 0804  . benztropine (COGENTIN) tablet 2 mg  2 mg Oral QHS Lurena Nida, NP   2 mg at 05/01/14 2115  . [START ON 05/03/2014] clonazePAM (KLONOPIN) tablet 0.5 mg  0.5 mg Oral QHS Marshe Shrestha      .  cloZAPine (CLOZARIL) tablet 100 mg  100 mg Oral BID Lurena Nida, NP   100 mg at 05/02/14 0805  . cloZAPine (CLOZARIL) tablet 200 mg  200 mg Oral QHS Elmarie Shiley, NP   200 mg at 05/01/14 2115  . FLUoxetine (PROZAC) capsule 20 mg  20 mg Oral Daily Heleena Miceli   20 mg at 05/02/14 0805  . folic acid (FOLVITE) tablet 1 mg  1 mg Oral Daily Lurena Nida, NP   1 mg at 05/02/14 7106  . levETIRAcetam (KEPPRA XR) 24 hr tablet 500 mg  500 mg Oral BID Lurena Nida, NP   500 mg at 05/02/14 0804  . levothyroxine (SYNTHROID, LEVOTHROID) tablet 50 mcg  50 mcg Oral QAC breakfast Caren Griffins, MD   50 mcg at 05/02/14 0610  . magnesium hydroxide (MILK OF MAGNESIA) suspension 30 mL  30 mL Oral Daily PRN Benjamine Mola, FNP      . pantoprazole (PROTONIX) EC tablet 80 mg  80 mg Oral QAC lunch Caren Griffins, MD   80 mg at 05/01/14 1205    Lab Results:  No results found for this or any previous visit (from the past 48 hour(s)).  Physical Findings: AIMS: Facial and Oral Movements Muscles of Facial Expression: Mild Lips and Perioral Area: Mild Jaw: Mild Tongue: Mild,Extremity Movements Upper (arms, wrists, hands, fingers): Minimal Lower (legs, knees, ankles, toes): None, normal, Trunk Movements Neck, shoulders, hips: Minimal, Overall Severity Severity of abnormal movements (highest score from questions above): Mild Incapacitation due to abnormal movements: Minimal Patient's awareness of abnormal movements (rate only patient's report): Aware, mild distress, Dental Status Current problems with teeth and/or dentures?: No Does patient usually wear dentures?: No  CIWA:    COWS:     Treatment Plan Summary: Daily contact with patient to assess and evaluate symptoms and progress in treatment Medication management  Plan: 1. Continue crisis management and stabilization.  2. Medication management:  -Decreased Clonazepam to 0.5mg  qhs (due to daytime sedation) -Will continue patient on his other  medication regimen -CBC with Differential once weekly.  Medical Decision Making Problem Points:  Established problem, improving (1), Review of last therapy session (1) and Review of psycho-social stressors (1) Data Points:  Order Aims Assessment (2) Review or order clinical lab tests (1) Review or order medicine tests (1) Review of medication regiment & side effects (2)  I certify that inpatient services furnished can reasonably be expected to improve the patient's condition.   Corena Pilgrim, MD 05/02/2014

## 2014-05-02 NOTE — Progress Notes (Signed)
The focus of this group is to help patients review their daily goal of treatment and discuss progress on daily workbooks. Pt attended the evening group session and responded to all discussion prompts from the [place]. Pt shared that today was a good day, the highlight of which was a visit from his Aunt. "She didn't get to stay very long, but she brought me some magazines, which was nice." Pt's only additional request from Nursing Staff this evening was to shave, which was taken care of following the group. Pt's affect was flat.

## 2014-05-02 NOTE — BHH Group Notes (Signed)
[facility] LCSW Aftercare Discharge Planning Group Note   05/02/2014 10:57 AM  Participation Quality:  Did not attend    Trish Mage

## 2014-05-02 NOTE — Progress Notes (Signed)
D   Pt is pleasant on approach and is compliant with treatment   He know his medications and their theraputic use   He tends to isolate and sleep a lot his interaction with others is limited but appropriate   He reports less anxiety and panic attacks A   Verbal support given   Medications administered and effectiveness monitored   Q 15 min checks R   Pt safe at present

## 2014-05-03 NOTE — BHH Group Notes (Signed)
Adult Psychoeducational Group Note  Date:  05/03/2014 Time:  8:49 PM  Group Topic/Focus:  Wrap-Up Group:   The focus of this group is to help patients review their daily goal of treatment and discuss progress on daily workbooks.  Participation Level:  Did Not Attend  Participation Quality:  None  Affect:  None  Cognitive:  None  Insight: None  Engagement in Group:  None  Modes of Intervention:  Discussion  Additional Comments:  Brady Hoffman did attend group.  Marcelle Hepner A Ria Comment 05/03/2014, 8:49 PM

## 2014-05-03 NOTE — Progress Notes (Signed)
Adult Psychoeducational Group Note  Date:  05/03/2014 Time:  0900 am  Group Topic/Focus:  Wellness Toolbox:   The focus of this group is to discuss various aspects of wellness, balancing those aspects and exploring ways to increase the ability to experience wellness.  Patients will create a wellness toolbox for use upon discharge.  Participation Level:  Did Not Attend  Participation Quality:    Affect:    Cognitive:   Insight:   Engagement in Group:    Modes of Intervention:    Additional Comments:  Marissa Calamity 05/03/2014, 6:09 PM

## 2014-05-03 NOTE — Progress Notes (Signed)
D   Pt isolates to his room and does not interact with others  He is pleasant on approach and is polite when interacting with staff and his roommate  He did not attend group this evening and he did not come to get his medications   He takes medications without difficulty   He appears depressed and anxious A   Verbal support given  Medications administered and effectiveness monitored   Q 15 min checks R   Pt safe at present

## 2014-05-03 NOTE — BHH Group Notes (Signed)
[facility] LCSW Group Therapy  05/03/2014 , 12:43 PM   Type of Therapy:  Group Therapy  Participation Level: Did not attend  Summary of Progress/Problems: Today's group focused on the term Diagnosis.  Participants were asked to define the term, and then pronounce whether it is a negative, positive or neutral term.  Roque Lias B 05/03/2014 , 12:43 PM

## 2014-05-03 NOTE — Progress Notes (Signed)
Patient ID: Brady Hoffman, male   DOB: Apr 13, 1966, 48 y.o.   MRN: [id number] [facility] MD Progress Note  05/03/2014 10:38 AM Brady Hoffman  MRN:  [id number]  Subjective:   Patient states "I feel tired all the time. I am not really as anxious as I was."  Objective: Patient is seen and chart is reviewed. He is reporting feeling tired. Of note the patient stays in bed during much of the day. Brady Hoffman to become more active on the unit even by walking in the halls instead of staying in bed. Nursing staff report this morning that he got up for medications but then went back to sleep. Brady Hoffman having any problems sleeping last night.  However, he has been endorsing decreased depressive and anxious symptoms  Patient reports decreased psychosis and delusions and Hoffman suicidal/homicidal ideations, intent or plan. Patient is on Clozapine and his current WBC is 5.5 and ANC is 2.3, indicating a rising value from the last few days reports. Due to these lab findings his current dose of Clozaril remains unchanged. His last Clozaril level was therapeutic at 432.    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  Hyperlipidemia    .  Schizoaffective disorder    .  H/O: GI bleed    .  Seizures    .  Depression    .  Chronic kidney disease    .  Anemia  01/14/2014   .  Anxiety attack     AXIS IV: housing problems, other psychosocial or environmental problems and problems related to social environment  AXIS V: 51-60 Moderate Symptoms   ADL's:  Fair, needs encouragement from staff to change/bathe  Sleep: Good- as noted, reports some sedation, which may be related to medication  Appetite:  Fair  Suicidal Ideation:  Hoffman Homicidal Ideation:  Hoffman AEB (as evidenced by):  Psychiatric Specialty Exam: Physical Exam   Review of Systems  Constitutional: Negative.  Negative for fever.  HENT: Negative.  Negative for congestion, ear pain and sore throat.   Eyes: Negative.   Respiratory: Negative.  Negative for cough and shortness of breath.   Cardiovascular: Negative.  Negative for chest pain.  Gastrointestinal: Negative.  Negative for diarrhea.  Genitourinary: Negative.   Musculoskeletal: Negative.   Skin: Negative.   Neurological: Negative.   Endo/Heme/Allergies: Negative.   Psychiatric/Behavioral: Positive for depression. Negative for suicidal ideas, hallucinations, memory loss and substance abuse. The patient is nervous/anxious. The patient does not have insomnia.     Blood pressure 132/80, pulse 98, temperature 97.6 F (36.4 C), temperature source Oral, resp. rate 20, height 6\' 3"  (1.905 m), weight 95.255 kg (210 lb), SpO2 99.00%.Body mass index is 26.25 kg/(m^2).  General Appearance: Disheveled  Eye Sport and exercise psychologist::  Fair  Speech:  Slowed   Volume:  Decreased  Mood:  Depressed   Affect:  Constricted  Thought Process:  Circumstantial  Orientation:  Full (Time, Place, and Person)  Thought Content: Hoffman avh  Suicidal Thoughts:  No  Homicidal Thoughts:  No  Memory:  Immediate;   Fair Recent;   Poor Remote;   Poor  Judgement:  Impaired  Insight:  Lacking  Psychomotor Activity:  Decreased  Concentration:  Fair  Recall:  Brady Hoffman  Language: Fair  Akathisia:  No  Handed:  Right  AIMS (if indicated):     Assets:  Desire for improvement, mother very involved in his care  Sleep:  Number of Hours: 6   Musculoskeletal: Strength & Muscle Tone: within normal limits Gait & Station: normal Patient leans: N/A  Current Medications: Current Facility-Administered Medications  Medication Dose Route Frequency Provider Last Rate Last Dose  . acetaminophen (TYLENOL) tablet 650 mg  650 mg Oral Q6H PRN Benjamine Mola, FNP      . alum & mag hydroxide-simeth (MAALOX/MYLANTA) 200-200-20  MG/5ML suspension 30 mL  30 mL Oral Q4H PRN Benjamine Mola, FNP      . aspirin chewable tablet 81 mg  81 mg Oral Daily Lurena Nida, NP   81 mg at 05/03/14 5465  . benztropine (COGENTIN) tablet 2 mg  2 mg Oral QHS Lurena Nida, NP   2 mg at 05/02/14 2143  . clonazePAM (KLONOPIN) tablet 0.5 mg  0.5 mg Oral QHS Savan Ruta      . cloZAPine (CLOZARIL) tablet 100 mg  100 mg Oral BID Lurena Nida, NP   100 mg at 05/03/14 0354  . cloZAPine (CLOZARIL) tablet 200 mg  200 mg Oral QHS Elmarie Shiley, NP   200 mg at 05/02/14 2143  . FLUoxetine (PROZAC) capsule 20 mg  20 mg Oral Daily Kalisha Keadle   20 mg at 05/03/14 0808  . folic acid (FOLVITE) tablet 1 mg  1 mg Oral Daily Lurena Nida, NP   1 mg at 05/03/14 6568  . levETIRAcetam (KEPPRA XR) 24 hr tablet 500 mg  500 mg Oral BID Lurena Nida, NP   500 mg at 05/03/14 1275  . levothyroxine (SYNTHROID, LEVOTHROID) tablet 50 mcg  50 mcg Oral QAC breakfast Caren Griffins, MD   50 mcg at 05/03/14 [phone]  . magnesium hydroxide (MILK OF MAGNESIA) suspension 30 mL  30 mL Oral Daily PRN Benjamine Mola, FNP      . pantoprazole (PROTONIX) EC tablet 80 mg  80 mg Oral QAC lunch Caren Griffins, MD   80 mg at 05/02/14 1159    Lab Results:  No results found for this or any previous visit (from the past 48 hour(s)).  Physical Findings: AIMS: Facial and Oral Movements Muscles of Facial Expression: Mild Lips and Perioral Area: Mild Jaw: Mild Tongue: Mild,Extremity Movements Upper (arms, wrists, hands, fingers): Minimal Lower (legs, knees, ankles, toes): None, normal, Trunk Movements Neck, shoulders, hips: Minimal, Overall Severity Severity of abnormal movements (highest score from questions above): Mild Incapacitation due to abnormal movements: Minimal Patient's awareness of abnormal movements (rate only patient's report): Aware, mild distress, Dental Status Current problems with teeth and/or dentures?: No Does patient usually wear dentures?: No  CIWA:     COWS:     Treatment Plan Summary: Daily contact with patient to assess and evaluate symptoms and progress in treatment Medication management  Plan: 1. Continue crisis management and stabilization.  2. Medication management:  -Continue Clonazepam to 0.5mg  qhs for insomnia/anxiety -Continue Clozaril as ordered for psychosis.  -CBC with Differential once weekly. 3. Anticipate discharge back to group home tomorrow.  4. Address health issues: Vitals reviewed and stable, currently afebrile. Continue Keppra XR 500 mg for seizure management.    Medical Decision Making Problem Points:  Established problem, improving (1), Review of last therapy session (1) and Review of psycho-social stressors (1) Data Points:  Order Aims Assessment (2) Review or order clinical lab tests (1) Review of medication regiment &  side effects (2)  I certify that inpatient services furnished can reasonably be expected to improve the patient's condition.   Elmarie Shiley NP-C 05/03/2014 10:58 am   Patient seen, evaluated and I agree with notes by Nurse Practitioner. Corena Pilgrim, MD

## 2014-05-03 NOTE — Progress Notes (Signed)
D: Patient denies SI/HI and A/V hallucinations  A: Monitored q 15 minutes; patient encouraged to attend groups; patient educated about medications; patient given medications per physician orders; patient encouraged to express feelings and/or concerns  R: Patient is cooperative and pleasant; patient has been in the bed most of the day; patient is isolative and minimal; patient takes medication appropriately and tolerates medications

## 2014-05-03 NOTE — Progress Notes (Signed)
The focus of this group is to educate the patient on the purpose and policies of crisis stabilization and provide a format to answer questions about their admission.  The group details unit policies and expectations of patients while admitted.  Patient did not attend group. 

## 2014-05-04 LAB — CBC WITH DIFFERENTIAL/PLATELET
Basophils Absolute: 0 10*3/uL (ref 0.0–0.1)
Basophils Relative: 1 % (ref 0–1)
EOS ABS: 0.7 10*3/uL (ref 0.0–0.7)
EOS PCT: 12 % — AB (ref 0–5)
HEMATOCRIT: 34.3 % — AB (ref 39.0–52.0)
HEMOGLOBIN: 11.6 g/dL — AB (ref 13.0–17.0)
Lymphocytes Relative: 37 % (ref 12–46)
Lymphs Abs: 2.1 10*3/uL (ref 0.7–4.0)
MCH: 30.4 pg (ref 26.0–34.0)
MCHC: 33.8 g/dL (ref 30.0–36.0)
MCV: 89.8 fL (ref 78.0–100.0)
MONO ABS: 0.5 10*3/uL (ref 0.1–1.0)
MONOS PCT: 9 % (ref 3–12)
Neutro Abs: 2.4 10*3/uL (ref 1.7–7.7)
Neutrophils Relative %: 41 % — ABNORMAL LOW (ref 43–77)
PLATELETS: 138 10*3/uL — AB (ref 150–400)
RBC: 3.82 MIL/uL — ABNORMAL LOW (ref 4.22–5.81)
RDW: 12.2 % (ref 11.5–15.5)
WBC: 5.8 10*3/uL (ref 4.0–10.5)

## 2014-05-04 LAB — CLOZAPINE (CLOZARIL)
Clozapine Lvl: 421 mcg/L
NorClozapine: 239 mcg/L (ref 25–400)

## 2014-05-04 MED ORDER — FOLIC ACID 1 MG PO TABS: 1.0000 mg | ORAL_TABLET | Freq: Every day | ORAL | Status: DC

## 2014-05-04 MED ORDER — CLOZAPINE 100 MG PO TABS: 100.0000 mg | ORAL_TABLET | Freq: Two times a day (BID) | ORAL | Status: DC

## 2014-05-04 MED ORDER — BENZTROPINE MESYLATE 2 MG PO TABS: 2.0000 mg | ORAL_TABLET | Freq: Every day | ORAL | Status: DC

## 2014-05-04 MED ORDER — OMEPRAZOLE 40 MG PO CPDR: 40.0000 mg | DELAYED_RELEASE_CAPSULE | Freq: Every day | ORAL | Status: AC

## 2014-05-04 MED ORDER — CLONAZEPAM 0.5 MG PO TABS: 0.5000 mg | ORAL_TABLET | Freq: Every day | ORAL | Status: DC

## 2014-05-04 MED ORDER — CLOZAPINE 200 MG PO TABS: 200.0000 mg | ORAL_TABLET | Freq: Every day | ORAL | Status: DC

## 2014-05-04 MED ORDER — LEVETIRACETAM ER 500 MG PO TB24: 500.0000 mg | ORAL_TABLET | Freq: Two times a day (BID) | ORAL | Status: DC

## 2014-05-04 MED ORDER — FLUOXETINE HCL 20 MG PO CAPS: 20.0000 mg | ORAL_CAPSULE | Freq: Every day | ORAL | Status: DC

## 2014-05-04 MED ORDER — ASPIRIN 81 MG PO CHEW: 81.0000 mg | CHEWABLE_TABLET | Freq: Every day | ORAL | Status: AC

## 2014-05-04 MED ORDER — LEVOTHYROXINE SODIUM 50 MCG PO TABS: 50.0000 ug | ORAL_TABLET | Freq: Every day | ORAL | Status: AC

## 2014-05-04 MED ORDER — ATORVASTATIN CALCIUM 20 MG PO TABS: 20.0000 mg | ORAL_TABLET | Freq: Every day | ORAL | Status: AC

## 2014-05-04 NOTE — BHH Suicide Risk Assessment (Addendum)
   Demographic Factors:  Male, Low socioeconomic status, Unemployed and lives in group home  Total Time spent with patient: 20 minutes  Psychiatric Specialty Exam: Physical Exam  Psychiatric: He has a normal mood and affect. His speech is normal and behavior is normal. Judgment and thought content normal. Cognition and memory are normal.    Review of Systems  Constitutional: Negative.   HENT: Negative.   Eyes: Negative.   Respiratory: Negative.   Cardiovascular: Negative.   Gastrointestinal: Negative.   Genitourinary: Negative.   Musculoskeletal: Negative.   Skin: Negative.   Neurological: Negative.   Endo/Heme/Allergies: Negative.   Psychiatric/Behavioral: Negative.     Blood pressure 121/90, pulse 89, temperature 97.3 F (36.3 C), temperature source Oral, resp. rate 20, height 6\' 3"  (1.905 m), weight 95.255 kg (210 lb), SpO2 99.00%.Body mass index is 26.25 kg/(m^2).  General Appearance: Fairly Groomed  Engineer, water::  Good  Speech:  Clear and Coherent and Normal Rate  Volume:  Normal  Mood:  Euthymic  Affect:  Restricted  Thought Process:  Goal Directed  Orientation:  Full (Time, Place, and Person)  Thought Content:  Negative  Suicidal Thoughts:  No  Homicidal Thoughts:  No  Memory:  Immediate;   Fair Recent;   Fair Remote;   Fair  Judgement:  Fair  Insight:  Fair  Psychomotor Activity:  Normal  Concentration:  Fair  Recall:  [employer] of Knowledge:Fair  Language: Good  Akathisia:  No  Handed:  Right  AIMS (if indicated):     Assets:  Communication Skills Desire for Improvement Physical Health  Sleep:  Number of Hours: 6.75    Musculoskeletal: Strength & Muscle Tone: within normal limits Gait & Station: normal Patient leans: N/A   Mental Status Per Nursing Assessment::   On Admission:     Current Mental Status by Physician: patient denies suicidal ideation, intent or plan  Loss Factors: Financial problems/change in socioeconomic  status  Historical Factors: N/A  Risk Reduction Factors:   Positive social support and lives in group home  Continued Clinical Symptoms:  Resolution of anxiety, depression and delusions  Cognitive Features That Contribute To Risk:  Closed-mindedness    Suicide Risk:  Minimal: No identifiable suicidal ideation.  Patients presenting with no risk factors but with morbid ruminations; may be classified as minimal risk based on the severity of the depressive symptoms  Discharge Diagnoses:   AXIS I:  Schizoaffective disorder              Panic disorder w/o agoraphobia AXIS II:  Deferred AXIS III:   Past Medical History  Diagnosis Date  . Hyperlipidemia   . H/O: GI bleed   . Seizures   . Chronic kidney disease   . Anemia 01/14/2014   AXIS IV:  other psychosocial or environmental problems and problems related to social environment AXIS V:  61-70 mild symptoms  Plan Of Care/Follow-up recommendations:  Activity:  as tolerated Diet:  healthy Tests:  routine Other:  patient to keep his aftercare appointment  Is patient on multiple antipsychotic therapies at discharge:  No   Has Patient had three or more failed trials of antipsychotic monotherapy by history:  No  Recommended Plan for Multiple Antipsychotic Therapies: NA    Corena Pilgrim, MD 05/04/2014, 9:26 AM

## 2014-05-04 NOTE — Progress Notes (Signed)
Pt. Discharged per MD orders;  PT. Currently denies any HI/SI or AVH.  Pt. Was given education regarding follow up appointments and medications by RN.  Pt. Denies any questions or concerns about the medications.  Pt. Was escorted to the search room to retrieve his belongings by RN before being discharged to the hospital lobby.  

## 2014-05-04 NOTE — Progress Notes (Signed)
[facility] Adult Case Management Discharge Plan :  Will you be returning to the same living situation after discharge: Yes,  [place] At discharge, do you have transportation home?:Yes,  [facility] manager Do you have the ability to pay for your medications:Yes,  MCD  Release of information consent forms completed and in the chart;  Patient's signature needed at discharge.  Patient to Follow up at: Follow-up Information   Follow up with [place] On 05/20/2014. (Friday at 2:30 with Dr Darleene Cleaver)    Contact information:   [place] [address]      Patient denies SI/HI:   Yes,  yes    Safety Planning and Suicide Prevention discussed:  Yes,  yes  Trish Mage 05/04/2014, 10:32 AM

## 2014-05-04 NOTE — Discharge Summary (Signed)
Physician Discharge Summary Note  Patient:  Brady Hoffman is an 48 y.o., male MRN:  [id number] DOB:  1966-11-05 Patient phone:  [phone] (home)  Patient address:   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 His speech is normal and behavior is normal. Judgment and thought content normal. Cognition and memory are normal.    Review of Systems  Constitutional: Negative.   HENT: Negative.   Eyes: Negative.   Respiratory: Negative.   Cardiovascular: Negative.   Gastrointestinal: Negative.   Genitourinary: Negative.   Musculoskeletal: Negative.   Skin: Negative.   Neurological: Negative.   Endo/Heme/Allergies: Negative.   Psychiatric/Behavioral: Negative.     Blood pressure 121/90, pulse 89, temperature 97.3 F (36.3 C), temperature source Oral, resp. rate 20, height 6\' 3"  (1.905 m), weight 95.255 kg (210 lb), SpO2 99.00%.Body mass index is 26.25 kg/(m^2).  General Appearance: Fairly Groomed  Engineer, water::  Good  Speech:  Clear and Coherent and Normal Rate  Volume:  Normal  Mood:  Euthymic  Affect:  Restricted  Thought Process:  Goal Directed  Orientation:  Full (Time, Place, and Person)  Thought Content:  Negative  Suicidal Thoughts:  No  Homicidal Thoughts:  No  Memory:  Immediate;   Fair Recent;   Fair Remote;   Fair  Judgement:  Fair  Insight:  Fair  Psychomotor Activity:  Normal  Concentration:  Fair  Recall:  [employer] of Knowledge:Fair  Language: Good  Akathisia:  No   Handed:  Right  AIMS (if indicated):     Assets:  Communication Skills Desire for Improvement Physical Health  Sleep:  Number of Hours: 6.75    Past Psychiatric History: See H&P Diagnosis:  Hospitalizations:  Outpatient Care:  Substance Abuse Care:  Self-Mutilation:  Suicidal Attempts:  Violent Behaviors:   Musculoskeletal: Strength & Muscle Tone: within normal limits Gait & Station: normal Patient leans: N/A  DSM5:  AXIS I: Schizoaffective disorder  Panic disorder w/o agoraphobia  AXIS II: Deferred  AXIS III:  Past Medical History   Diagnosis  Date   .  Hyperlipidemia    .  H/O: GI bleed    .  Seizures    .  Chronic kidney disease    .  Anemia  01/14/2014   AXIS IV: other psychosocial or environmental problems and problems related to social environment  AXIS V: 61-70 mild symptoms  Level of Care:  OP  Hospital Course:  Brady Hoffman is a 48 year old male who was admitted voluntarily for severe panic attacks. He was admitted to [facility] after being medically cleared at [facility]. The patient had been scheduled to see Dr. Adele Schilder at [facility] but was not able to be seen. Patient recently had started at [facility] after being dissatisfied with [facility]. Brady Hoffman has been complaining of having several panic attacks every weeks for the last few months. When EMS brought the patient to [facility] he appeared very diaphoretic and having EPS. Patient is a very poor historian today during  his psychiatric assessment "I had panic attack. It's been a problem for a long time. I have heard voices. I feel paranoid. I have lived in a group home for twelve years. I have an adopted mother who is supportive." Notes from the patient's last appointment with Dr. Adele Schilder who last saw patient 03/16/14 were reviewed to gather more information. On his last visit patient reported recently seeing a Neurologist for seizure management. He also at that time complained of having frequent panic attacks that lasted for  several minutes. His irritability, anger and severe mood swings had dramatically improved since being started on Clozaril at some point in the past.          Brady Hoffman was admitted to the adult 400 unit. He was evaluated and his symptoms were identified. Medication management was discussed and initiated. His Clozaril was continued at 100 mg BID with 400 mg at hs for psychosis. Patient was started on Klonopin 0.5 mg BID to address his symptoms of anxiety.  He was oriented to the unit and encouraged to participate in unit programming. Medical problems were identified and treated appropriately. At first there was concerns that patient could be experiencing NMS due to complaints of lethargy and sweating. However, the medical consult service ruled this out as a possibility. Home medication was restarted as needed.        The patient was evaluated each day by a clinical provider to ascertain the patient's response to treatment. Brady Hoffman frequently complained of feeling sedated in the morning. His Clozaril was decreased after his level came back at 1232. His bedtime dose was decreased to 400 mg. His CBC's were monitored daily after his neutrophil count appeared to be declining. It appeared upon close evaluation that the level had spiked at one point and then proceeded to slowly stabilize. The patient was continued on Clozaril therapy. A repeat Clozaril level was within normal limits. His mother was very involved in his care and expressed concern about his dose being lowered any further. Brady Hoffman was reported to have been on Clozaril for many years.   Improvement was noted by the patient's report of decreasing symptoms especially of anxiety, improved sleep and appetite, affect, medication tolerance, behavior, and participation in unit programming.  He was asked each day to complete a self inventory noting mood, mental status, pain, new symptoms, anxiety and concerns. Patient required prompting to shower and attend  groups. His Klonopin was eventually decreased to only hs to help with morning sedation. The patient had difficulty interacting with others on the unit most likely due to some underlining cognitive deficits. There were no behavior problems reported during his hospital stay.          He responded well to medication and being in a therapeutic and supportive environment. Positive and appropriate behavior was noted and the patient was motivated for recovery.  The patient worked closely with the treatment team and case manager to develop a discharge plan with appropriate goals. Coping skills, problem solving as well as relaxation therapies were also part of the unit programming.         By the day of discharge he was in much improved condition than upon admission.  Symptoms were reported as significantly decreased or resolved completely. The patient denied SI/HI and voiced no AVH. He was motivated to continue taking medication with a goal of continued improvement in mental health. Brady Hoffman was discharged back to his group home with a plan to follow up as noted below.  Copies of his recent lab values from daily CBC monitoring was provided to the group home upon discharge.   Consults:  Internal medicine   Significant Diagnostic Studies:  Daily CBC for clozaril monitoring, Clozaril and Keppra level,   Discharge Vitals:   Blood pressure 121/90, pulse 89, temperature 97.3 F (36.3 C), temperature source Oral, resp. rate 20, height 6\' 3"  (1.905 m), weight 95.255 kg (210 lb), SpO2 99.00%. Body mass index is 26.25 kg/(m^2). Lab Results:   Results for orders placed during the hospital encounter of 04/21/14 (from the past 72 hour(s))  CBC WITH DIFFERENTIAL     Status: Abnormal   Collection Time    05/04/14  6:25 AM      Result Value Ref Range   WBC 5.8  4.0 - 10.5 K/uL   RBC 3.82 (*) 4.22 - 5.81 MIL/uL   Hemoglobin 11.6 (*) 13.0 - 17.0 g/dL   HCT 34.3 (*) 39.0 - 52.0 %   MCV 89.8  78.0 - 100.0 fL    MCH 30.4  26.0 - 34.0 pg   MCHC 33.8  30.0 - 36.0 g/dL   RDW 12.2  11.5 - 15.5 %   Platelets 138 (*) 150 - 400 K/uL   Neutrophils Relative % 41 (*) 43 - 77 %   Neutro Abs 2.4  1.7 - 7.7 K/uL   Lymphocytes Relative 37  12 - 46 %   Lymphs Abs 2.1  0.7 - 4.0 K/uL   Monocytes Relative 9  3 - 12 %   Monocytes Absolute 0.5  0.1 - 1.0 K/uL   Eosinophils Relative 12 (*) 0 - 5 %   Eosinophils Absolute 0.7  0.0 - 0.7 K/uL   Basophils Relative 1  0 - 1 %   Basophils Absolute 0.0  0.0 - 0.1 K/uL   Comment: Performed at [facility]    Physical Findings: AIMS: Facial and Oral Movements Muscles of Facial Expression: Mild Lips and Perioral Area: Mild Jaw: Mild Tongue: Mild,Extremity Movements Upper (arms, wrists, hands, fingers): Minimal Lower (legs, knees, ankles, toes): None, normal, Trunk Movements Neck, shoulders, hips: Minimal, Overall Severity Severity of abnormal movements (highest score from questions above): Mild Incapacitation due to abnormal movements: Minimal Patient's awareness of abnormal movements (rate only patient's report): Aware, mild distress, Dental Status Current problems with teeth and/or dentures?: No Does patient usually wear dentures?: No  CIWA:    COWS:     Psychiatric Specialty Exam: See Psychiatric Specialty Exam and Suicide Risk Assessment completed by Attending Physician prior to discharge.  Discharge destination:  Home  Is patient on multiple antipsychotic therapies at discharge:  No   Has Patient had three or more failed trials of antipsychotic monotherapy by history:  No  Recommended Plan for Multiple Antipsychotic Therapies: NA      Discharge Instructions   Discharge instructions    Complete by:  As directed   Please follow up with Primary Care Provider. You were started on a mediation for underactive thyroid call Synthroid. You will need to see your Provider within four weeks for repeat lab-work to evaluate thyroid function.             Medication List    STOP taking these medications       hydrOXYzine 25 MG capsule  Commonly known as:  VISTARIL     venlafaxine 75 MG tablet  Commonly known as:  EFFEXOR      TAKE these medications     Indication  aspirin 81 MG chewable tablet  Chew 1 tablet (81 mg total) by mouth daily.   Indication:  Inflammation, Heart Attack affecting Area Damaged by Previous Attack     atorvastatin 20 MG tablet  Commonly known as:  LIPITOR  Take 1 tablet (20 mg total) by mouth at bedtime.   Indication:  Disease of the Heart and Blood Vessels     benztropine 2 MG tablet  Commonly known as:  COGENTIN  Take 1 tablet (2 mg total) by mouth at bedtime.   Indication:  Extrapyramidal Reaction caused by Medications     clonazePAM 0.5 MG tablet  Commonly known as:  KLONOPIN  Take 1 tablet (0.5 mg total) by mouth at bedtime.   Indication:  Complex Partial Epilepsy, Panic Disorder     clozapine 200 MG tablet  Commonly known as:  CLOZARIL  Take 1 tablet (200 mg total) by mouth at bedtime.   Indication:  Schizoaffective Disorder     cloZAPine 100 MG tablet  Commonly known as:  CLOZARIL  Take 1 tablet (100 mg total) by mouth 2 (two) times daily.   Indication:  Schizoaffective Disorder     FLUoxetine 20 MG capsule  Commonly known as:  PROZAC  Take 1 capsule (20 mg total) by mouth daily.   Indication:  Depression, Panic disorder     folic acid 1 MG tablet  Commonly known as:  FOLVITE  Take 1 tablet (1 mg total) by mouth daily.   Indication:  Deficiency of Folic Acid in the Diet     levETIRAcetam 500 MG 24 hr tablet  Commonly known as:  KEPPRA XR  Take 1 tablet (500 mg total) by mouth 2 (two) times daily.   Indication:  Partial Onset Seizures     levothyroxine 50 MCG tablet  Commonly known as:  SYNTHROID, LEVOTHROID  Take 1 tablet (50 mcg total) by mouth daily before breakfast. For underactive thyroid   Indication:  Underactive Thyroid     omeprazole 40 MG capsule   Commonly known as:  PRILOSEC  Take 1 capsule (40 mg total) by mouth daily.   Indication:  Gastroesophageal Reflux Disease       Follow-up Information   Follow up with Home On 05/20/2014. (Friday at 2:30 with Dr Darleene Cleaver)    Contact information:   [place] [address]      Follow-up recommendations:   Activity: as tolerated  Diet: healthy  Tests: routine  Other: patient to keep his aftercare appointment   Comments:  Take all your medications as prescribed by your mental healthcare provider.  Report any adverse effects and or reactions from your medicines to your outpatient provider promptly.  Patient is instructed and cautioned to not engage in alcohol and or illegal drug use while on prescription medicines.  In the event of worsening symptoms, patient is instructed to call the crisis hotline, 911 and or go to the nearest ED for appropriate evaluation and treatment of symptoms.  Follow-up with your primary care provider for your other medical issues, concerns and or health care needs.   Total Discharge Time:  Greater than 30 minutes.  Signed: Elmarie Shiley NP-C 05/04/2014, 4:01 PM  Patient seen, evaluated and I agree with notes by Nurse Practitioner. Corena Pilgrim, MD

## 2014-05-04 NOTE — [facility] Suicide Risk Assessment (Signed)
[place] [facility] INPATIENT:  Family/Significant Other Suicide Prevention Education  Suicide Prevention Education:  Patient Discharged to [place]:  Suicide Prevention Education Not Provided: Brady Hoffman is returning to [facility].  The patient is discharging to another healthcare facility for continuation of treatment.  The patient's medical information, including suicide ideations and risk factors, are a part of the medical information shared with the receiving healthcare facility.  [place] 05/04/2014, 10:30 AM

## 2014-05-04 NOTE — Tx Team (Signed)
  Interdisciplinary Treatment Plan Update   Date Reviewed:  05/04/2014  Time Reviewed:  10:31 AM  Progress in Treatment:   Attending groups: Yes Participating in groups: Yes Taking medication as prescribed: Yes  Tolerating medication: Yes Family/Significant other contact made: Yes  Patient understands diagnosis: Yes  Discussing patient identified problems/goals with staff: Yes Medical problems stabilized or resolved: Yes Denies suicidal/homicidal ideation: Yes Patient has not harmed self or others: Yes  For review of initial/current patient goals, please see plan of care.  Estimated Length of Stay:  D/C today  Reason for Continuation of Hospitalization:   New Problems/Goals identified:  N/A  Discharge Plan or Barriers:   return to [facility], follow up outpt  Additional Comments:  Attendees:  Signature: Corena Pilgrim, MD 05/04/2014 10:31 AM   Signature: Ripley Fraise, LCSW 05/04/2014 10:31 AM  Signature: Elmarie Shiley, NP 05/04/2014 10:31 AM  Signature: Mayra Neer, RN 05/04/2014 10:31 AM  Signature: Darrol Angel, RN 05/04/2014 10:31 AM  Signature:  05/04/2014 10:31 AM  Signature:   05/04/2014 10:31 AM  Signature:    Signature:    Signature:    Signature:    Signature:    Signature:      Scribe for Treatment Team:   Ripley Fraise, LCSW  05/04/2014 10:31 AM

## 2014-05-06 NOTE — Progress Notes (Signed)
Patient Discharge Instructions:  After Visit Summary (AVS):   Faxed to:  05/06/14 Discharge Summary Note:   Faxed to:  05/06/14 Psychiatric Admission Assessment Note:   Faxed to:  05/06/14 Suicide Risk Assessment - Discharge Assessment:   Faxed to:  05/06/14 Faxed/Sent to the Next Level Care provider:  05/06/14 Faxed to [facility] [place], 05/06/2014, 3:42 PM

## 2014-05-17 ENCOUNTER — Ambulatory Visit (HOSPITAL_COMMUNITY): Payer: Self-pay | Admitting: Psychiatry

## 2014-05-20 ENCOUNTER — Other Ambulatory Visit (HOSPITAL_COMMUNITY): Payer: Self-pay | Admitting: Psychiatry

## 2014-05-20 LAB — CBC WITH DIFFERENTIAL/PLATELET
BASOS ABS: 0 10*3/uL (ref 0.0–0.1)
Basophils Relative: 1 % (ref 0–1)
EOS ABS: 0.2 10*3/uL (ref 0.0–0.7)
EOS PCT: 4 % (ref 0–5)
HEMATOCRIT: 38.1 % — AB (ref 39.0–52.0)
Hemoglobin: 13.1 g/dL (ref 13.0–17.0)
LYMPHS ABS: 1.6 10*3/uL (ref 0.7–4.0)
Lymphocytes Relative: 35 % (ref 12–46)
MCH: 30 pg (ref 26.0–34.0)
MCHC: 34.4 g/dL (ref 30.0–36.0)
MCV: 87.4 fL (ref 78.0–100.0)
Monocytes Absolute: 0.3 10*3/uL (ref 0.1–1.0)
Monocytes Relative: 7 % (ref 3–12)
Neutro Abs: 2.5 10*3/uL (ref 1.7–7.7)
Neutrophils Relative %: 53 % (ref 43–77)
PLATELETS: 168 10*3/uL (ref 150–400)
RBC: 4.36 MIL/uL (ref 4.22–5.81)
RDW: 12.9 % (ref 11.5–15.5)
WBC: 4.7 10*3/uL (ref 4.0–10.5)

## 2014-05-23 LAB — CLOZAPINE (CLOZARIL)
Clozapine Lvl: 395 mcg/L
NorClozapine: 244 mcg/L (ref 25–400)

## 2014-06-08 ENCOUNTER — Other Ambulatory Visit (HOSPITAL_COMMUNITY): Payer: Self-pay | Admitting: Psychiatry

## 2014-06-08 LAB — CBC WITH DIFFERENTIAL/PLATELET
Basophils Absolute: 0 10*3/uL (ref 0.0–0.1)
EOS ABS: 0 10*3/uL (ref 0.0–0.7)
HCT: 37.6 % — ABNORMAL LOW (ref 39.0–52.0)
Hemoglobin: 12.4 g/dL — ABNORMAL LOW (ref 13.0–17.0)
LYMPHS ABS: 1.7 10*3/uL (ref 0.7–4.0)
LYMPHS PCT: 36 % (ref 12–46)
MCH: 29.9 pg (ref 26.0–34.0)
MCHC: 33.1 g/dL (ref 30.0–36.0)
MCV: 90.4 fL (ref 78.0–100.0)
MONO ABS: 0.3 10*3/uL (ref 0.1–1.0)
Monocytes Relative: 6 % (ref 3–12)
Neutro Abs: 2.8 10*3/uL (ref 1.7–7.7)
Neutrophils Relative %: 58 % (ref 43–77)
Platelets: 121 10*3/uL — ABNORMAL LOW (ref 150–400)
RBC: 4.15 MIL/uL — AB (ref 4.22–5.81)
RDW: 12.9 % (ref 11.5–15.5)
WBC: 4.8 10*3/uL (ref 4.0–10.5)

## 2014-06-09 ENCOUNTER — Ambulatory Visit (HOSPITAL_COMMUNITY): Payer: Self-pay | Admitting: Psychology

## 2014-06-11 LAB — CLOZAPINE (CLOZARIL)
Clozapine Lvl: 782 mcg/L
NorClozapine: 296 mcg/L (ref 25–400)

## 2014-08-04 ENCOUNTER — Other Ambulatory Visit (HOSPITAL_COMMUNITY): Payer: Self-pay | Admitting: Psychiatry

## 2014-08-04 LAB — CBC WITH DIFFERENTIAL/PLATELET
Basophils Absolute: 0 10*3/uL (ref 0.0–0.1)
Eosinophils Absolute: 0 10*3/uL (ref 0.0–0.7)
HCT: 40.5 % (ref 39.0–52.0)
HEMOGLOBIN: 12.7 g/dL — AB (ref 13.0–17.0)
LYMPHS PCT: 29 % (ref 12–46)
Lymphs Abs: 1.6 10*3/uL (ref 0.7–4.0)
MCH: 28.5 pg (ref 26.0–34.0)
MCHC: 30.2 g/dL (ref 30.0–36.0)
MCV: 90.7 fL (ref 78.0–100.0)
Monocytes Absolute: 0.4 10*3/uL (ref 0.1–1.0)
Monocytes Relative: 8 % (ref 3–12)
NEUTROS PCT: 63 % (ref 43–77)
Neutro Abs: 3.5 10*3/uL (ref 1.7–7.7)
Platelets: 130 10*3/uL — ABNORMAL LOW (ref 150–400)
RBC: 4.46 MIL/uL (ref 4.22–5.81)
RDW: 13.6 % (ref 11.5–15.5)
WBC: 5.6 10*3/uL (ref 4.0–10.5)

## 2014-08-08 LAB — CLOZAPINE (CLOZARIL)
Clozapine Lvl: 886 mcg/L
NorClozapine: 363 mcg/L (ref 25–400)

## 2014-08-22 ENCOUNTER — Other Ambulatory Visit (HOSPITAL_COMMUNITY): Payer: Self-pay | Admitting: *Deleted

## 2014-08-31 ENCOUNTER — Encounter: Payer: Self-pay | Admitting: Nurse Practitioner

## 2014-08-31 ENCOUNTER — Encounter (INDEPENDENT_AMBULATORY_CARE_PROVIDER_SITE_OTHER): Payer: Self-pay

## 2014-08-31 ENCOUNTER — Ambulatory Visit (INDEPENDENT_AMBULATORY_CARE_PROVIDER_SITE_OTHER): Payer: Medicare Other | Admitting: Nurse Practitioner

## 2014-08-31 VITALS — BP 98/66 | HR 93 | Temp 97.0°F | Ht 75.0 in | Wt 203.0 lb

## 2014-08-31 DIAGNOSIS — G40909 Epilepsy, unspecified, not intractable, without status epilepticus: Secondary | ICD-10-CM

## 2014-08-31 NOTE — Progress Notes (Signed)
PATIENT: Brady Hoffman DOB: 11/29/1966  REASON FOR VISIT: routine follow up for seizure HISTORY FROM: patient  HISTORY OF PRESENT ILLNESS: 48 year old male with schizoaffective disorder, anxiety, here for evaluation of seizure.   08/31/14 (LL): Patient returns for seizure follow up, accompanied by group home worker.  No further seizures, no problems with medication.  No complaints.  01/02/14 patient was at group home, had not been feeling well for a few weeks. He stood up, lost his balance fell and struck his head. Apparently he hit his head lost consciousness and had a seizure. The exact sequence of events is not clear. Patient was taken to the hospital, intubated, treated for seizure disorder and also found to be in acute renal failure. Patient gradually recovered. He was started on levetiracetam 500 mg twice a day. His renal function gradually improve. Patient went to inpatient rehabilitation and eventually back to his group home. Since that time no further seizures. Patient did have a "febrile seizure" at age 67 months old. Apparently he had a "drop attack", in the setting of high fever. He was treated with antiseizure medication at that time. Patient's family history is unknown as he is adopted. Patient never had meningitis or encephalitis. Patient is feeling back to himself. No further seizures since starting antiseizure medication. Patient's mother is here with him and is asking about other movements consisting of rocking, restless, panic attack type movements.   REVIEW OF SYSTEMS: Full 14 system review of systems performed and notable only for: nothing   ALLERGIES: Allergies  Allergen Reactions  . Risperidone And Related Other (See Comments)    Pt. States head feels like a rock    HOME MEDICATIONS: Outpatient Prescriptions Prior to Visit  Medication Sig Dispense Refill  . aspirin 81 MG chewable tablet Chew 1 tablet (81 mg total) by mouth daily.      Marland Kitchen atorvastatin (LIPITOR)  20 MG tablet Take 1 tablet (20 mg total) by mouth at bedtime.  30 tablet  1  . benztropine (COGENTIN) 2 MG tablet Take 1 tablet (2 mg total) by mouth at bedtime.  30 tablet  0  . clonazePAM (KLONOPIN) 0.5 MG tablet Take 1 tablet (0.5 mg total) by mouth at bedtime.  30 tablet  0  . cloZAPine (CLOZARIL) 100 MG tablet Take 1 tablet (100 mg total) by mouth 2 (two) times daily.  60 tablet  0  . clozapine (CLOZARIL) 200 MG tablet Take 1 tablet (200 mg total) by mouth at bedtime.  30 tablet  0  . FLUoxetine (PROZAC) 20 MG capsule Take 1 capsule (20 mg total) by mouth daily.  30 capsule  0  . folic acid (FOLVITE) 1 MG tablet Take 1 tablet (1 mg total) by mouth daily.  30 tablet  1  . levETIRAcetam (KEPPRA XR) 500 MG 24 hr tablet Take 1 tablet (500 mg total) by mouth 2 (two) times daily.  60 tablet  12  . levothyroxine (SYNTHROID, LEVOTHROID) 50 MCG tablet Take 1 tablet (50 mcg total) by mouth daily before breakfast. For underactive thyroid  30 tablet  0  . omeprazole (PRILOSEC) 40 MG capsule Take 1 capsule (40 mg total) by mouth daily.  30 capsule  1   No facility-administered medications prior to visit.    PHYSICAL EXAM Filed Vitals:   08/31/14 1144  BP: 98/66  Pulse: 93  Temp: 97 F (36.1 C)  TempSrc: Oral  Height: 6\' 3"  (1.905 m)  Weight: 203 lb (92.08 kg)   Body  mass index is 25.37 kg/(m^2).  GENERAL EXAM:  Patient is in no distress; well developed, nourished and groomed; neck is supple  CARDIOVASCULAR:  Regular rate and rhythm, no murmurs, no carotid bruits  NEUROLOGIC:  MENTAL STATUS: MILD PSYCHOMOTOR SLOWING. Awake, alert, oriented to person, place and time, recent and remote memory intact, normal attention and concentration, language fluent, comprehension intact, naming intact, fund of knowledge appropriate  CRANIAL NERVE: no papilledema on fundoscopic exam, pupils equal and reactive to light, visual fields full to confrontation, extraocular muscles intact, no nystagmus, facial  sensation and strength symmetric, hearing intact, palate elevates symmetrically, uvula midline, shoulder shrug symmetric, tongue midline.  MOTOR: normal bulk and tone, full strength in the BUE, BLE  SENSORY: normal and symmetric to light touch, temperature, vibration  COORDINATION: finger-nose-finger, fine finger movements, heel-shin normal  REFLEXES: deep tendon reflexes present and symmetric  GAIT/STATION: narrow based gait; able to walk on toes, heels and tandem; romberg is negative  01/03/14 EEG - normal asleep  01/07/14 MRI brain - No acute or focal intracranial finding. Brain atrophy premature for age.   DIAGNOSTIC DATA (LABS, IMAGING, TESTING) - I reviewed patient records, labs, notes, testing and imaging myself where available.  Lab Results  Component Value Date   WBC 5.6 08/04/2014   HGB 12.7* 08/04/2014   HCT 40.5 08/04/2014   MCV 90.7 08/04/2014   PLT 130* 08/04/2014      Component Value Date/Time   NA 143 04/24/2014 0645   K 4.3 04/24/2014 0645   CL 105 04/24/2014 0645   CO2 26 04/24/2014 0645   GLUCOSE 100* 04/24/2014 0645   BUN 16 04/24/2014 0645   CREATININE 1.24 04/24/2014 0645   CREATININE 1.36* 02/25/2014 1109   CALCIUM 8.7 04/24/2014 0645   PROT 6.6 04/24/2014 0645   ALBUMIN 3.5 04/24/2014 0645   AST 13 04/24/2014 0645   ALT 11 04/24/2014 0645   ALKPHOS 111 04/24/2014 0645   BILITOT 0.2* 04/24/2014 0645   GFRNONAA 67* 04/24/2014 0645   GFRAA 33* 04/24/2014 0645    ASSESSMENT: 48 y.o. year old male here with schizoaffective disorder, with new onset possible seizure in setting of acute renal failure. Now doing well with gradually improving renal function. Tolerating LEV XR 500mg  BID; no more seizures.   PLAN:  - continue LEV XR 500mg  BID - Follow up in 6-12 months, sooner as needed.  Rudi Rummage LAM, MSN, FNP-BC, A/GNP-C 08/31/2014, 11:53 AM [facility] [address] [place],  [address] [phone]  Note: This document was prepared with digital  dictation and possible smart phrase technology. Any transcriptional errors that result from this process are unintentional.

## 2014-08-31 NOTE — Patient Instructions (Signed)
  PLAN:  - continue LEV XR 500mg  BID - Follow up in 6 months, sooner as needed.  Even if you don't know the underlying cause of your epilepsy, there may be factors (often called "triggers") that provoke your seizures. Triggers are things that may make you more likely to have a seizure in a person already diagnosed with epilepsy. Triggers may also make seizures more frequent or more severe. It's important to keep track of your seizures so you can learn if you have any triggers. If you know what things may trigger your seizures, you can try to change your lifestyle or environment to avoid them.  Some common seizure triggers are:  Missing a dose of seizure medicine  Not sleeping enough or not sleeping well  Illness (with or without a fever)  Hormonal changes (for example, a woman's menstrual cycle)  Flashing lights or patterns  Feeling stressed  Missing meals  Poor nutrition or irregular eating habits  Dehydration (not drinking enough fluids)  Drinking a lot of alcohol  Alcohol withdrawal  Using cocaine, ecstasy, or other recreational drugs  Taking certain medicines or supplements  Some over-the-counter drugs, prescription medicines, and supplements can keep your seizure medicine from working well - and this could trigger a seizure. Be sure to tell your doctor about all the medicines you take. Some people also find that certain activities, noises, or foods can trigger their seizures. Remember, every person is different.

## 2014-09-02 ENCOUNTER — Other Ambulatory Visit (HOSPITAL_COMMUNITY): Payer: Self-pay | Admitting: Psychiatry

## 2014-09-02 LAB — CBC WITH DIFFERENTIAL/PLATELET
Basophils Absolute: 0 10*3/uL (ref 0.0–0.1)
Eosinophils Absolute: 0 10*3/uL (ref 0.0–0.7)
HCT: 40.8 % (ref 39.0–52.0)
HEMOGLOBIN: 13.4 g/dL (ref 13.0–17.0)
LYMPHS PCT: 30 % (ref 12–46)
Lymphs Abs: 1.8 10*3/uL (ref 0.7–4.0)
MCH: 29.3 pg (ref 26.0–34.0)
MCHC: 32.7 g/dL (ref 30.0–36.0)
MCV: 89.3 fL (ref 78.0–100.0)
Monocytes Absolute: 0.5 10*3/uL (ref 0.1–1.0)
Monocytes Relative: 8 % (ref 3–12)
NEUTROS ABS: 3.7 10*3/uL (ref 1.7–7.7)
Neutrophils Relative %: 62 % (ref 43–77)
PLATELETS: 125 10*3/uL — AB (ref 150–400)
RBC: 4.57 MIL/uL (ref 4.22–5.81)
RDW: 13.2 % (ref 11.5–15.5)
WBC: 5.9 10*3/uL (ref 4.0–10.5)

## 2014-09-04 LAB — CLOZAPINE (CLOZARIL)
Clozapine Lvl: 541 mcg/L
NORCLOZAPINE: 234 ug/L (ref 25–400)

## 2014-09-06 ENCOUNTER — Encounter (HOSPITAL_COMMUNITY): Payer: Self-pay | Admitting: Psychiatry

## 2014-09-12 NOTE — Progress Notes (Signed)
I reviewed note and agree with plan.   VIKRAM R. PENUMALLI, MD  Certified in Neurology, Neurophysiology and Neuroimaging  [facility] [address], [address] [place], [place] [address] [phone]   

## 2014-09-30 ENCOUNTER — Other Ambulatory Visit: Payer: Self-pay | Admitting: Psychiatry

## 2014-09-30 LAB — CBC WITH DIFFERENTIAL/PLATELET
Basophils Absolute: 0 10*3/uL (ref 0.0–0.1)
Basophils Relative: 0 % (ref 0–1)
Eosinophils Absolute: 0.1 10*3/uL (ref 0.0–0.7)
Eosinophils Relative: 3 % (ref 0–5)
HEMATOCRIT: 39.2 % (ref 39.0–52.0)
Hemoglobin: 13.3 g/dL (ref 13.0–17.0)
LYMPHS ABS: 1.8 10*3/uL (ref 0.7–4.0)
LYMPHS PCT: 36 % (ref 12–46)
MCH: 30 pg (ref 26.0–34.0)
MCHC: 33.9 g/dL (ref 30.0–36.0)
MCV: 88.5 fL (ref 78.0–100.0)
MONO ABS: 0.4 10*3/uL (ref 0.1–1.0)
Monocytes Relative: 8 % (ref 3–12)
Neutro Abs: 2.6 10*3/uL (ref 1.7–7.7)
Neutrophils Relative %: 53 % (ref 43–77)
Platelets: 141 10*3/uL — ABNORMAL LOW (ref 150–400)
RBC: 4.43 MIL/uL (ref 4.22–5.81)
RDW: 13.9 % (ref 11.5–15.5)
WBC: 4.9 10*3/uL (ref 4.0–10.5)

## 2014-10-02 LAB — CLOZAPINE (CLOZARIL)
Clozapine Lvl: 958 mcg/L — AB
NORCLOZAPINE: 467 ug/L — AB (ref 25–400)

## 2014-10-03 ENCOUNTER — Telehealth (HOSPITAL_COMMUNITY): Payer: Self-pay | Admitting: *Deleted

## 2014-10-03 NOTE — Telephone Encounter (Signed)
Brady Hoffman at [facility] called to MD inform of Critical Lab Value: Clozapine Level collected 09/30/14: 958 NorClozapine Level drawn 09/30/14: 467

## 2014-10-19 ENCOUNTER — Encounter: Payer: Self-pay | Admitting: Diagnostic Neuroimaging

## 2014-10-31 ENCOUNTER — Other Ambulatory Visit: Payer: Self-pay | Admitting: Psychiatry

## 2014-10-31 LAB — CBC WITH DIFFERENTIAL/PLATELET
Basophils Absolute: 0 10*3/uL (ref 0.0–0.1)
Eosinophils Absolute: 0 10*3/uL (ref 0.0–0.7)
HEMATOCRIT: 43 % (ref 39.0–52.0)
Hemoglobin: 13.6 g/dL (ref 13.0–17.0)
LYMPHS PCT: 27 % (ref 12–46)
Lymphs Abs: 2 10*3/uL (ref 0.7–4.0)
MCH: 28.8 pg (ref 26.0–34.0)
MCHC: 31.6 g/dL (ref 30.0–36.0)
MCV: 91.1 fL (ref 78.0–100.0)
MONO ABS: 0.5 10*3/uL (ref 0.1–1.0)
Monocytes Relative: 7 % (ref 3–12)
NEUTROS ABS: 5 10*3/uL (ref 1.7–7.7)
Neutrophils Relative %: 66 % (ref 43–77)
PLATELETS: 144 10*3/uL — AB (ref 150–400)
RBC: 4.73 MIL/uL (ref 4.22–5.81)
RDW: 12.6 % (ref 11.5–15.5)
WBC: 7.5 10*3/uL (ref 4.0–10.5)

## 2014-11-03 LAB — CLOZAPINE (CLOZARIL)
CLOZAPINE LVL: 803 ug/L
NorClozapine: 289 mcg/L (ref 25–400)

## 2014-11-28 ENCOUNTER — Other Ambulatory Visit: Payer: Self-pay | Admitting: Psychiatry

## 2014-11-28 LAB — CBC WITH DIFFERENTIAL/PLATELET
Basophils Absolute: 0 10*3/uL (ref 0.0–0.1)
Eosinophils Absolute: 0 10*3/uL (ref 0.0–0.7)
HCT: 38.3 % — ABNORMAL LOW (ref 39.0–52.0)
Hemoglobin: 13.1 g/dL (ref 13.0–17.0)
LYMPHS PCT: 24 % (ref 12–46)
Lymphs Abs: 2 10*3/uL (ref 0.7–4.0)
MCH: 31.3 pg (ref 26.0–34.0)
MCHC: 34.2 g/dL (ref 30.0–36.0)
MCV: 91.4 fL (ref 78.0–100.0)
MONO ABS: 0.4 10*3/uL (ref 0.1–1.0)
Monocytes Relative: 5 % (ref 3–12)
NEUTROS ABS: 5.8 10*3/uL (ref 1.7–7.7)
NEUTROS PCT: 71 % (ref 43–77)
Platelets: 135 10*3/uL — ABNORMAL LOW (ref 150–400)
RBC: 4.19 MIL/uL — AB (ref 4.22–5.81)
RDW: 12.6 % (ref 11.5–15.5)
WBC: 8.2 10*3/uL (ref 4.0–10.5)

## 2014-12-01 LAB — CLOZAPINE (CLOZARIL)
Clozapine Lvl: 964 mcg/L — AB
NorClozapine: 384 mcg/L (ref 25–400)

## 2014-12-28 ENCOUNTER — Other Ambulatory Visit: Payer: Self-pay | Admitting: Psychiatry

## 2014-12-28 LAB — CBC WITH DIFFERENTIAL/PLATELET
BASOS ABS: 0 10*3/uL (ref 0.0–0.1)
Eosinophils Absolute: 0 10*3/uL (ref 0.0–0.7)
HEMATOCRIT: 42.5 % (ref 39.0–52.0)
Hemoglobin: 13.4 g/dL (ref 13.0–17.0)
Lymphocytes Relative: 32 % (ref 12–46)
Lymphs Abs: 1.5 10*3/uL (ref 0.7–4.0)
MCH: 28.9 pg (ref 26.0–34.0)
MCHC: 31.6 g/dL (ref 30.0–36.0)
MCV: 91.6 fL (ref 78.0–100.0)
MONO ABS: 0.3 10*3/uL (ref 0.1–1.0)
Monocytes Relative: 6 % (ref 3–12)
NEUTROS ABS: 2.9 10*3/uL (ref 1.7–7.7)
Neutrophils Relative %: 62 % (ref 43–77)
Platelets: 119 10*3/uL — ABNORMAL LOW (ref 150–400)
RBC: 4.64 MIL/uL (ref 4.22–5.81)
RDW: 12.6 % (ref 11.5–15.5)
WBC: 4.6 10*3/uL (ref 4.0–10.5)

## 2014-12-31 LAB — CLOZAPINE (CLOZARIL)
CLOZAPINE LVL: 688 ug/L
NorClozapine: 343 mcg/L (ref 25–400)

## 2015-01-20 ENCOUNTER — Other Ambulatory Visit: Payer: Self-pay | Admitting: Psychiatry

## 2015-01-20 LAB — CBC WITH DIFFERENTIAL/PLATELET
BASOS ABS: 0 10*3/uL (ref 0.0–0.1)
Eosinophils Absolute: 0 10*3/uL (ref 0.0–0.7)
HEMATOCRIT: 44.3 % (ref 39.0–52.0)
HEMOGLOBIN: 14.2 g/dL (ref 13.0–17.0)
LYMPHS ABS: 2 10*3/uL (ref 0.7–4.0)
Lymphocytes Relative: 36 % (ref 12–46)
MCH: 28.7 pg (ref 26.0–34.0)
MCHC: 32.1 g/dL (ref 30.0–36.0)
MCV: 89.5 fL (ref 78.0–100.0)
MONO ABS: 0.3 10*3/uL (ref 0.1–1.0)
MONOS PCT: 6 % (ref 3–12)
NEUTROS PCT: 58 % (ref 43–77)
Neutro Abs: 3.2 10*3/uL (ref 1.7–7.7)
Platelets: 123 10*3/uL — ABNORMAL LOW (ref 150–400)
RBC: 4.95 MIL/uL (ref 4.22–5.81)
RDW: 12.5 % (ref 11.5–15.5)
WBC: 5.6 10*3/uL (ref 4.0–10.5)

## 2015-01-25 LAB — CLOZAPINE (CLOZARIL)
CLOZAPINE LVL: 760 ug/L
NorClozapine: 370 mcg/L (ref 25–400)

## 2015-02-17 ENCOUNTER — Other Ambulatory Visit: Payer: Self-pay | Admitting: Psychiatry

## 2015-02-17 LAB — CBC WITH DIFFERENTIAL/PLATELET
HCT: 42.5 % (ref 39.0–52.0)
HEMOGLOBIN: 14.1 g/dL (ref 13.0–17.0)
Lymphocytes Relative: 40 % (ref 12–46)
Lymphs Abs: 2.4 10*3/uL (ref 0.7–4.0)
MCH: 29 pg (ref 26.0–34.0)
MCHC: 33.2 g/dL (ref 30.0–36.0)
MCV: 87.4 fL (ref 78.0–100.0)
MONO ABS: 0.5 10*3/uL (ref 0.1–1.0)
MONOS PCT: 9 % (ref 3–12)
Neutro Abs: 3 10*3/uL (ref 1.7–7.7)
Neutrophils Relative %: 51 % (ref 43–77)
Platelets: 146 10*3/uL — ABNORMAL LOW (ref 150–400)
RBC: 4.87 MIL/uL (ref 4.22–5.81)
RDW: 13 % (ref 11.5–15.5)
WBC: 5.9 10*3/uL (ref 4.0–10.5)

## 2015-02-19 LAB — CLOZAPINE (CLOZARIL)
Clozapine Lvl: 578 mcg/L
NorClozapine: 257 mcg/L (ref 25–400)

## 2015-03-20 ENCOUNTER — Other Ambulatory Visit: Payer: Self-pay | Admitting: Psychiatry

## 2015-03-20 LAB — CBC WITH DIFFERENTIAL/PLATELET
HCT: 42.3 % (ref 39.0–52.0)
Hemoglobin: 14.4 g/dL (ref 13.0–17.0)
Lymphocytes Relative: 33 % (ref 12–46)
Lymphs Abs: 2.3 10*3/uL (ref 0.7–4.0)
MCH: 30 pg (ref 26.0–34.0)
MCHC: 34 g/dL (ref 30.0–36.0)
MCV: 88.1 fL (ref 78.0–100.0)
MONOS PCT: 7 % (ref 3–12)
Monocytes Absolute: 0.5 10*3/uL (ref 0.1–1.0)
Neutro Abs: 4.1 10*3/uL (ref 1.7–7.7)
Neutrophils Relative %: 60 % (ref 43–77)
Platelets: 158 10*3/uL (ref 150–400)
RBC: 4.8 MIL/uL (ref 4.22–5.81)
RDW: 12.7 % (ref 11.5–15.5)
WBC: 6.9 10*3/uL (ref 4.0–10.5)

## 2015-03-23 LAB — CLOZAPINE (CLOZARIL)
Clozapine Lvl: 614 mcg/L
NORCLOZAPINE: 278 ug/L (ref 25–400)

## 2015-04-14 ENCOUNTER — Other Ambulatory Visit: Payer: Self-pay | Admitting: Psychiatry

## 2015-04-14 LAB — CBC WITH DIFFERENTIAL/PLATELET
HCT: 41 % (ref 39.0–52.0)
Hemoglobin: 14.2 g/dL (ref 13.0–17.0)
LYMPHS ABS: 2.1 10*3/uL (ref 0.7–4.0)
LYMPHS PCT: 32 % (ref 12–46)
MCH: 30.9 pg (ref 26.0–34.0)
MCHC: 34.6 g/dL (ref 30.0–36.0)
MCV: 89.1 fL (ref 78.0–100.0)
Monocytes Absolute: 0.5 10*3/uL (ref 0.1–1.0)
Monocytes Relative: 7 % (ref 3–12)
NEUTROS PCT: 63 % (ref 43–77)
Neutro Abs: 4.2 10*3/uL (ref 1.7–7.7)
PLATELETS: 134 10*3/uL — AB (ref 150–400)
RBC: 4.59 MIL/uL (ref 4.22–5.81)
RDW: 12.7 % (ref 11.5–15.5)
WBC: 6.6 10*3/uL (ref 4.0–10.5)

## 2015-04-19 LAB — CLOZAPINE (CLOZARIL)
Clozapine Lvl: 821 mcg/L
NorClozapine: 329 mcg/L (ref 25–400)

## 2015-05-12 ENCOUNTER — Other Ambulatory Visit: Payer: Self-pay | Admitting: Psychiatry

## 2015-05-12 LAB — CBC WITH DIFFERENTIAL/PLATELET
Basophils Absolute: 0.1 10*3/uL (ref 0.0–0.1)
Basophils Relative: 1 % (ref 0–1)
Eosinophils Absolute: 0.4 10*3/uL (ref 0.0–0.7)
Eosinophils Relative: 7 % — ABNORMAL HIGH (ref 0–5)
HEMATOCRIT: 39.8 % (ref 39.0–52.0)
HEMOGLOBIN: 13.8 g/dL (ref 13.0–17.0)
LYMPHS ABS: 1.9 10*3/uL (ref 0.7–4.0)
LYMPHS PCT: 34 % (ref 12–46)
MCH: 30.2 pg (ref 26.0–34.0)
MCHC: 34.7 g/dL (ref 30.0–36.0)
MCV: 87.1 fL (ref 78.0–100.0)
MONOS PCT: 9 % (ref 3–12)
MPV: 10.7 fL (ref 8.6–12.4)
Monocytes Absolute: 0.5 10*3/uL (ref 0.1–1.0)
Neutro Abs: 2.8 10*3/uL (ref 1.7–7.7)
Neutrophils Relative %: 49 % (ref 43–77)
Platelets: 157 10*3/uL (ref 150–400)
RBC: 4.57 MIL/uL (ref 4.22–5.81)
RDW: 13.5 % (ref 11.5–15.5)
WBC: 5.7 10*3/uL (ref 4.0–10.5)

## 2015-05-16 LAB — CLOZAPINE (CLOZARIL)
CLOZAPINE LVL: 499 ug/L
NorClozapine: 234 mcg/L (ref 25–400)

## 2015-06-02 ENCOUNTER — Other Ambulatory Visit: Payer: Self-pay | Admitting: Psychiatry

## 2015-06-02 LAB — CBC WITH DIFFERENTIAL/PLATELET
BASOS ABS: 0 10*3/uL (ref 0.0–0.1)
Basophils Relative: 0 % (ref 0–1)
EOS PCT: 5 % (ref 0–5)
Eosinophils Absolute: 0.3 10*3/uL (ref 0.0–0.7)
HCT: 42.1 % (ref 39.0–52.0)
Hemoglobin: 14.5 g/dL (ref 13.0–17.0)
LYMPHS PCT: 33 % (ref 12–46)
Lymphs Abs: 1.7 10*3/uL (ref 0.7–4.0)
MCH: 30.6 pg (ref 26.0–34.0)
MCHC: 34.4 g/dL (ref 30.0–36.0)
MCV: 88.8 fL (ref 78.0–100.0)
MONO ABS: 0.6 10*3/uL (ref 0.1–1.0)
MONOS PCT: 11 % (ref 3–12)
MPV: 11.6 fL (ref 8.6–12.4)
Neutro Abs: 2.6 10*3/uL (ref 1.7–7.7)
Neutrophils Relative %: 51 % (ref 43–77)
Platelets: 133 10*3/uL — ABNORMAL LOW (ref 150–400)
RBC: 4.74 MIL/uL (ref 4.22–5.81)
RDW: 12.3 % (ref 11.5–15.5)
WBC: 5 10*3/uL (ref 4.0–10.5)

## 2015-06-06 LAB — CLOZAPINE (CLOZARIL)
Clozapine Lvl: 788 mcg/L
NorClozapine: 374 mcg/L (ref 25–400)

## 2015-07-07 ENCOUNTER — Other Ambulatory Visit: Payer: Self-pay | Admitting: Psychiatry

## 2015-07-07 LAB — CBC WITH DIFFERENTIAL/PLATELET
BASOS ABS: 0 10*3/uL (ref 0.0–0.1)
BASOS PCT: 0 % (ref 0–1)
Eosinophils Absolute: 0.2 10*3/uL (ref 0.0–0.7)
Eosinophils Relative: 3 % (ref 0–5)
HCT: 40.7 % (ref 39.0–52.0)
Hemoglobin: 13.9 g/dL (ref 13.0–17.0)
Lymphocytes Relative: 35 % (ref 12–46)
Lymphs Abs: 2.1 10*3/uL (ref 0.7–4.0)
MCH: 30.2 pg (ref 26.0–34.0)
MCHC: 34.2 g/dL (ref 30.0–36.0)
MCV: 88.3 fL (ref 78.0–100.0)
MPV: 11.9 fL (ref 8.6–12.4)
Monocytes Absolute: 0.5 10*3/uL (ref 0.1–1.0)
Monocytes Relative: 9 % (ref 3–12)
NEUTROS PCT: 53 % (ref 43–77)
Neutro Abs: 3.2 10*3/uL (ref 1.7–7.7)
PLATELETS: 148 10*3/uL — AB (ref 150–400)
RBC: 4.61 MIL/uL (ref 4.22–5.81)
RDW: 12.5 % (ref 11.5–15.5)
WBC: 6 10*3/uL (ref 4.0–10.5)

## 2015-07-12 LAB — CLOZAPINE (CLOZARIL)
CLOZAPINE LVL: 831 ug/L
NORCLOZAPINE: 437 ug/L — AB (ref 25–400)

## 2015-08-04 ENCOUNTER — Other Ambulatory Visit: Payer: Self-pay | Admitting: Psychiatry

## 2015-08-04 LAB — CBC WITH DIFFERENTIAL/PLATELET
Basophils Absolute: 0 10*3/uL (ref 0.0–0.1)
Basophils Relative: 0 % (ref 0–1)
Eosinophils Absolute: 0.2 10*3/uL (ref 0.0–0.7)
Eosinophils Relative: 3 % (ref 0–5)
HEMATOCRIT: 41.2 % (ref 39.0–52.0)
HEMOGLOBIN: 13.9 g/dL (ref 13.0–17.0)
LYMPHS ABS: 2 10*3/uL (ref 0.7–4.0)
Lymphocytes Relative: 33 % (ref 12–46)
MCH: 30.3 pg (ref 26.0–34.0)
MCHC: 33.7 g/dL (ref 30.0–36.0)
MCV: 89.8 fL (ref 78.0–100.0)
MONOS PCT: 8 % (ref 3–12)
MPV: 11.6 fL (ref 8.6–12.4)
Monocytes Absolute: 0.5 10*3/uL (ref 0.1–1.0)
NEUTROS ABS: 3.5 10*3/uL (ref 1.7–7.7)
Neutrophils Relative %: 56 % (ref 43–77)
Platelets: 138 10*3/uL — ABNORMAL LOW (ref 150–400)
RBC: 4.59 MIL/uL (ref 4.22–5.81)
RDW: 12.5 % (ref 11.5–15.5)
WBC: 6.2 10*3/uL (ref 4.0–10.5)

## 2015-08-09 LAB — CLOZAPINE (CLOZARIL)
CLOZAPINE LVL: 754 ug/L
NORCLOZAPINE: 587 ug/L — AB (ref 25–400)

## 2015-08-31 ENCOUNTER — Telehealth: Payer: Self-pay

## 2015-08-31 ENCOUNTER — Other Ambulatory Visit: Payer: Self-pay | Admitting: Psychiatry

## 2015-08-31 ENCOUNTER — Ambulatory Visit (INDEPENDENT_AMBULATORY_CARE_PROVIDER_SITE_OTHER): Payer: Medicare Other | Admitting: Adult Health

## 2015-08-31 ENCOUNTER — Encounter: Payer: Self-pay | Admitting: Adult Health

## 2015-08-31 VITALS — BP 116/89 | HR 109 | Ht 76.0 in | Wt 218.0 lb

## 2015-08-31 DIAGNOSIS — R569 Unspecified convulsions: Secondary | ICD-10-CM

## 2015-08-31 LAB — CBC WITH DIFFERENTIAL/PLATELET
Basophils Absolute: 0.1 10*3/uL (ref 0.0–0.1)
Basophils Relative: 1 % (ref 0–1)
EOS ABS: 0.2 10*3/uL (ref 0.0–0.7)
EOS PCT: 3 % (ref 0–5)
HCT: 40.5 % (ref 39.0–52.0)
Hemoglobin: 14.2 g/dL (ref 13.0–17.0)
LYMPHS ABS: 1.8 10*3/uL (ref 0.7–4.0)
Lymphocytes Relative: 28 % (ref 12–46)
MCH: 30.9 pg (ref 26.0–34.0)
MCHC: 35.1 g/dL (ref 30.0–36.0)
MCV: 88.2 fL (ref 78.0–100.0)
MONOS PCT: 7 % (ref 3–12)
MPV: 10.7 fL (ref 8.6–12.4)
Monocytes Absolute: 0.5 10*3/uL (ref 0.1–1.0)
Neutro Abs: 4 10*3/uL (ref 1.7–7.7)
Neutrophils Relative %: 61 % (ref 43–77)
Platelets: 156 10*3/uL (ref 150–400)
RBC: 4.59 MIL/uL (ref 4.22–5.81)
RDW: 14.2 % (ref 11.5–15.5)
WBC: 6.6 10*3/uL (ref 4.0–10.5)

## 2015-08-31 NOTE — Telephone Encounter (Signed)
Patient NO- Showed her apt today.

## 2015-08-31 NOTE — Progress Notes (Signed)
PATIENT: Brady Hoffman DOB: 25-Nov-1966  REASON FOR VISIT: follow up -seizure HISTORY FROM: patient  HISTORY OF PRESENT ILLNESS: Mr. Brady Hoffman is a 49 year old male with a history of seizures. He returns today for follow-up. She is currently taking Keppra 500 mg twice a day. Patient states that he has not had any additional seizure events. His mother is with him today. Patient is able to complete all ADLs independently. He does not operate a motor vehicle. Denies any new neurological symptoms. He returns today for an evaluation.  HISTORY  08/31/14 (LL): Patient returns for seizure follow up, accompanied by group home worker. No further seizures, no problems with medication. No complaints.  01/02/14 patient was at group home, had not been feeling well for a few weeks. He stood up, lost his balance fell and struck his head. Apparently he hit his head lost consciousness and had a seizure. The exact sequence of events is not clear. Patient was taken to the hospital, intubated, treated for seizure disorder and also found to be in acute renal failure. Patient gradually recovered. He was started on levetiracetam 500 mg twice a day. His renal function gradually improve. Patient went to inpatient rehabilitation and eventually back to his group home. Since that time no further seizures. Patient did have a "febrile seizure" at age 48 months old. Apparently he had a "drop attack", in the setting of high fever. He was treated with antiseizure medication at that time. Patient's family history is unknown as he is adopted. Patient never had meningitis or encephalitis. Patient is feeling back to himself. No further seizures since starting antiseizure medication. Patient's mother is here with him and is asking about other movements consisting of rocking, restless, panic attack type movements.   REVIEW OF SYSTEMS: Out of a complete 14 system review of symptoms, the patient complains only of the following  symptoms, and all other reviewed systems are negative.  See history of present illness  ALLERGIES: Allergies  Allergen Reactions  . Risperidone And Related Other (See Comments)    Pt. States head feels like a rock    HOME MEDICATIONS: Outpatient Prescriptions Prior to Visit  Medication Sig Dispense Refill  . aspirin 81 MG chewable tablet Chew 1 tablet (81 mg total) by mouth daily.    Marland Kitchen atorvastatin (LIPITOR) 20 MG tablet Take 1 tablet (20 mg total) by mouth at bedtime. 30 tablet 1  . benztropine (COGENTIN) 2 MG tablet Take 1 tablet (2 mg total) by mouth at bedtime. 30 tablet 0  . clonazePAM (KLONOPIN) 0.5 MG tablet Take 1 tablet (0.5 mg total) by mouth at bedtime. 30 tablet 0  . cloZAPine (CLOZARIL) 100 MG tablet Take 1 tablet (100 mg total) by mouth 2 (two) times daily. 60 tablet 0  . clozapine (CLOZARIL) 200 MG tablet Take 1 tablet (200 mg total) by mouth at bedtime. 30 tablet 0  . FLUoxetine (PROZAC) 20 MG capsule Take 1 capsule (20 mg total) by mouth daily. 30 capsule 0  . folic acid (FOLVITE) 1 MG tablet Take 1 tablet (1 mg total) by mouth daily. 30 tablet 1  . levETIRAcetam (KEPPRA XR) 500 MG 24 hr tablet Take 1 tablet (500 mg total) by mouth 2 (two) times daily. 60 tablet 12  . levothyroxine (SYNTHROID, LEVOTHROID) 50 MCG tablet Take 1 tablet (50 mcg total) by mouth daily before breakfast. For underactive thyroid 30 tablet 0  . omeprazole (PRILOSEC) 40 MG capsule Take 1 capsule (40 mg total) by mouth daily. 30 capsule  1   No facility-administered medications prior to visit.    PAST MEDICAL HISTORY: Past Medical History  Diagnosis Date  . Hyperlipidemia   . Schizoaffective disorder   . H/O: GI bleed   . Seizures   . Depression   . Chronic kidney disease   . Anemia 01/14/2014  . Anxiety attack     PAST SURGICAL HISTORY: Past Surgical History  Procedure Laterality Date  . Mole removal    . Wisdom tooth extraction    . Esophagogastroduodenoscopy  11/01/2011     Procedure: ESOPHAGOGASTRODUODENOSCOPY (EGD);  Surgeon: Beryle Beams;  Location: [facility];  Service: Endoscopy;  Laterality: N/A;    FAMILY HISTORY: Family History  Problem Relation Age of Onset  . Adopted: Yes    SOCIAL HISTORY: Social History   Social History  . Marital Status: Single    Spouse Name: N/A  . Number of Children: 0  . Years of Education: College   Occupational History  .  Other   Social History Main Topics  . Smoking status: Never Smoker   . Smokeless tobacco: Never Used  . Alcohol Use: No  . Drug Use: No  . Sexual Activity: No   Other Topics Concern  . Not on file   Social History Narrative   Patient lives in [place].   Right handed.   Education  Two years of college.Caffeine Use: 2 cups daily  Coke cola.       PHYSICAL EXAM  Filed Vitals:   08/31/15 1518  BP: 116/89  Pulse: 109  Height: 6\' 4"  (1.93 m)  Weight: 218 lb (98.884 kg)   Body mass index is 26.55 kg/(m^2).  Generalized: Well developed, in no acute distress   Neurological examination  Mentation: Alert oriented to time, place, history taking. Follows all commands speech and language fluent Cranial nerve II-XII: Pupils were equal round reactive to light. Extraocular movements were full, visual field were full on confrontational test. Facial sensation and strength were normal. Uvula tongue midline. Head turning and shoulder shrug  were normal and symmetric. Motor: The motor testing reveals 5 over 5 strength of all 4 extremities. Good symmetric motor tone is noted throughout.  Sensory: Sensory testing is intact to soft touch on all 4 extremities. No evidence of extinction is noted.  Coordination: Cerebellar testing reveals good finger-nose-finger and heel-to-shin bilaterally.  Gait and station: Gait is normal. Tandem gait is normal. Romberg is negative. No drift is seen.  Reflexes: Deep tendon reflexes are symmetric and normal bilaterally.   DIAGNOSTIC DATA (LABS,  IMAGING, TESTING) - I reviewed patient records, labs, notes, testing and imaging myself where available.  Lab Results  Component Value Date   WBC 6.2 08/04/2015   HGB 13.9 08/04/2015   HCT 41.2 08/04/2015   MCV 89.8 08/04/2015   PLT 138* 08/04/2015        ASSESSMENT AND PLAN 49 y.o. year old male  has a past medical history of Hyperlipidemia; Schizoaffective disorder; H/O: GI bleed; Seizures; Depression; Chronic kidney disease; Anemia (01/14/2014); and Anxiety attack. here with:  1. Seizures  Overall the patient is doing well. He will continue on Keppra XR  500 mg twice a day. Patient advised that if he has any seizure events he should let us know. He will follow-up in one year or sooner if needed.  Ward Givens, MSN, NP-C 08/31/2015, 3:23 PM [facility] [address] [place], [place] [address] [phone]

## 2015-08-31 NOTE — Patient Instructions (Signed)
Continue Keppra XR 500 mg twice a day. If you have any seizure events please let us know.

## 2015-08-31 NOTE — Progress Notes (Signed)
I reviewed note and agree with plan.   Penni Bombard, MD 3/74/8270, 7:86 PM Certified in Neurology, Neurophysiology and Neuroimaging  [facility] [address] [place], [place] [address] [phone]

## 2015-09-01 ENCOUNTER — Ambulatory Visit: Payer: Medicare Other | Admitting: Adult Health

## 2015-09-01 ENCOUNTER — Ambulatory Visit: Payer: Medicare Other | Admitting: Nurse Practitioner

## 2015-09-04 LAB — CLOZAPINE (CLOZARIL)
Clozapine Lvl: 611 mcg/L
NORCLOZAPINE: 424 ug/L — AB (ref 25–400)

## 2015-09-22 ENCOUNTER — Other Ambulatory Visit: Payer: Self-pay | Admitting: Psychiatry

## 2015-09-22 LAB — CBC WITH DIFFERENTIAL/PLATELET
BASOS ABS: 0 10*3/uL (ref 0.0–0.1)
Basophils Relative: 0 % (ref 0–1)
Eosinophils Absolute: 0.2 10*3/uL (ref 0.0–0.7)
Eosinophils Relative: 3 % (ref 0–5)
HEMATOCRIT: 40 % (ref 39.0–52.0)
HEMOGLOBIN: 13.9 g/dL (ref 13.0–17.0)
LYMPHS PCT: 32 % (ref 12–46)
Lymphs Abs: 2.2 10*3/uL (ref 0.7–4.0)
MCH: 31 pg (ref 26.0–34.0)
MCHC: 34.8 g/dL (ref 30.0–36.0)
MCV: 89.3 fL (ref 78.0–100.0)
MONOS PCT: 8 % (ref 3–12)
MPV: 11.9 fL (ref 8.6–12.4)
Monocytes Absolute: 0.6 10*3/uL (ref 0.1–1.0)
NEUTROS ABS: 4 10*3/uL (ref 1.7–7.7)
NEUTROS PCT: 57 % (ref 43–77)
PLATELETS: 142 10*3/uL — AB (ref 150–400)
RBC: 4.48 MIL/uL (ref 4.22–5.81)
RDW: 12.7 % (ref 11.5–15.5)
WBC: 7 10*3/uL (ref 4.0–10.5)

## 2015-09-27 LAB — CLOZAPINE (CLOZARIL)
CLOZAPINE LVL: 611 ug/L
NorClozapine: 326 mcg/L (ref 25–400)

## 2015-11-03 ENCOUNTER — Other Ambulatory Visit: Payer: Self-pay | Admitting: Psychiatry

## 2015-11-03 LAB — CBC WITH DIFFERENTIAL/PLATELET
Basophils Absolute: 0 10*3/uL (ref 0.0–0.1)
Basophils Relative: 0 % (ref 0–1)
EOS ABS: 0.3 10*3/uL (ref 0.0–0.7)
EOS PCT: 4 % (ref 0–5)
HCT: 40.6 % (ref 39.0–52.0)
Hemoglobin: 14.1 g/dL (ref 13.0–17.0)
Lymphocytes Relative: 31 % (ref 12–46)
Lymphs Abs: 2 10*3/uL (ref 0.7–4.0)
MCH: 31 pg (ref 26.0–34.0)
MCHC: 34.7 g/dL (ref 30.0–36.0)
MCV: 89.2 fL (ref 78.0–100.0)
MONOS PCT: 8 % (ref 3–12)
MPV: 10.7 fL (ref 8.6–12.4)
Monocytes Absolute: 0.5 10*3/uL (ref 0.1–1.0)
Neutro Abs: 3.6 10*3/uL (ref 1.7–7.7)
Neutrophils Relative %: 57 % (ref 43–77)
PLATELETS: 150 10*3/uL (ref 150–400)
RBC: 4.55 MIL/uL (ref 4.22–5.81)
RDW: 13.6 % (ref 11.5–15.5)
WBC: 6.4 10*3/uL (ref 4.0–10.5)

## 2015-11-07 LAB — CLOZAPINE (CLOZARIL)
CLOZAPINE LVL: 889 ug/L
NorClozapine: 372 mcg/L (ref 25–400)

## 2015-12-05 ENCOUNTER — Other Ambulatory Visit: Payer: Self-pay | Admitting: Psychiatry

## 2015-12-05 LAB — CBC WITH DIFFERENTIAL/PLATELET
BASOS ABS: 0 10*3/uL (ref 0.0–0.1)
BASOS PCT: 0 % (ref 0–1)
EOS ABS: 0.3 10*3/uL (ref 0.0–0.7)
Eosinophils Relative: 4 % (ref 0–5)
HCT: 40.6 % (ref 39.0–52.0)
Hemoglobin: 13.8 g/dL (ref 13.0–17.0)
Lymphocytes Relative: 32 % (ref 12–46)
Lymphs Abs: 2.3 10*3/uL (ref 0.7–4.0)
MCH: 30.2 pg (ref 26.0–34.0)
MCHC: 34 g/dL (ref 30.0–36.0)
MCV: 88.8 fL (ref 78.0–100.0)
MPV: 11.4 fL (ref 8.6–12.4)
Monocytes Absolute: 0.6 10*3/uL (ref 0.1–1.0)
Monocytes Relative: 9 % (ref 3–12)
NEUTROS PCT: 55 % (ref 43–77)
Neutro Abs: 3.9 10*3/uL (ref 1.7–7.7)
PLATELETS: 152 10*3/uL (ref 150–400)
RBC: 4.57 MIL/uL (ref 4.22–5.81)
RDW: 12.1 % (ref 11.5–15.5)
WBC: 7.1 10*3/uL (ref 4.0–10.5)

## 2015-12-08 LAB — CLOZAPINE (CLOZARIL)
Clozapine Lvl: 382 mcg/L
NorClozapine: 304 mcg/L (ref 25–400)

## 2016-01-03 ENCOUNTER — Other Ambulatory Visit: Payer: Self-pay | Admitting: Psychiatry

## 2016-01-03 LAB — CBC WITH DIFFERENTIAL/PLATELET
BASOS PCT: 1 % (ref 0–1)
Basophils Absolute: 0.1 10*3/uL (ref 0.0–0.1)
EOS ABS: 0.3 10*3/uL (ref 0.0–0.7)
Eosinophils Relative: 5 % (ref 0–5)
HCT: 39.8 % (ref 39.0–52.0)
HEMOGLOBIN: 13.5 g/dL (ref 13.0–17.0)
Lymphocytes Relative: 32 % (ref 12–46)
Lymphs Abs: 1.9 10*3/uL (ref 0.7–4.0)
MCH: 29.9 pg (ref 26.0–34.0)
MCHC: 33.9 g/dL (ref 30.0–36.0)
MCV: 88.2 fL (ref 78.0–100.0)
MONOS PCT: 8 % (ref 3–12)
MPV: 10.7 fL (ref 8.6–12.4)
Monocytes Absolute: 0.5 10*3/uL (ref 0.1–1.0)
NEUTROS ABS: 3.2 10*3/uL (ref 1.7–7.7)
NEUTROS PCT: 54 % (ref 43–77)
PLATELETS: 151 10*3/uL (ref 150–400)
RBC: 4.51 MIL/uL (ref 4.22–5.81)
RDW: 14 % (ref 11.5–15.5)
WBC: 5.9 10*3/uL (ref 4.0–10.5)

## 2016-01-06 LAB — CLOZAPINE (CLOZARIL)
Clozapine Lvl: 296 mcg/L
NorClozapine: 291 mcg/L (ref 25–400)

## 2016-09-03 ENCOUNTER — Ambulatory Visit (INDEPENDENT_AMBULATORY_CARE_PROVIDER_SITE_OTHER): Payer: Medicare Other | Admitting: Diagnostic Neuroimaging

## 2016-09-03 ENCOUNTER — Encounter: Payer: Self-pay | Admitting: Diagnostic Neuroimaging

## 2016-09-03 VITALS — BP 102/72 | HR 110 | Wt 240.4 lb

## 2016-09-03 DIAGNOSIS — R569 Unspecified convulsions: Secondary | ICD-10-CM

## 2016-09-03 NOTE — Progress Notes (Signed)
[facility]  PATIENT: Brady Hoffman DOB: Dec 26, 1965  REFERRING CLINICIAN:  HISTORY FROM: patient  REASON FOR VISIT: follow up   HISTORICAL  CHIEF COMPLAINT:  Chief Complaint  Patient presents with  . Seizures    rm 7, lives at [facility], "no seizure activity; fell x 1 not paying attention going down steps, L arm abrasions"  . Follow-up    one year    HISTORY OF PRESENT ILLNESS:   UPDATE 09/04/15 (VRP): Since last visit, doing well. No sz. Pt came here with aunt (who is in the car).   UPDATE 08/31/15 (MM): He returns today for follow-up. She is currently taking Keppra 500 mg twice a day. Patient states that he has not had any additional seizure events. His mother is with him today. Patient is able to complete all ADLs independently. He does not operate a motor vehicle. Denies any new neurological symptoms. He returns today for an evaluation.  UPDATE 08/31/14 (LL): Patient returns for seizure follow up, accompanied by group home worker.  No further seizures, no problems with medication.  No complaints.  PRIOR HPI (03/01/14): 50 year old male with schizoaffective disorder, anxiety, here for evaluation of seizure. 01/02/14 patient was at group home, had not been feeling well for a few weeks. He stood up, lost his balance fell and struck his head. Apparently he hit his head lost consciousness and had a seizure. The exact sequence of events is not clear. Patient was taken to the hospital, intubated, treated for seizure disorder and also found to be in acute renal failure. Patient gradually recovered. He was started on levetiracetam 500 mg twice a day. His renal function gradually improve. Patient went to inpatient rehabilitation at L2 date back to his group home. Since that time no further seizures. Patient did have a "febrile seizure" at age 23 months old. Apparently he had a "drop attack", in the setting of high fever. He was treated with antiseizure  medication at that time. Patient's family history is unknown as he is adopted. Patient never had meningitis or encephalitis. Patient is feeling back to himself. No further seizures since starting antiseizure medication. Patient's mother is here with him and is asking about other movements consisting of rocking, restless, panic attack type movements.   REVIEW OF SYSTEMS: Full 14 system review of systems performed and negative with exception of: depression anxiety dizziness.  ALLERGIES: Allergies  Allergen Reactions  . Risperidone And Related Other (See Comments)    Pt. States head feels like a rock    HOME MEDICATIONS: Outpatient Medications Prior to Visit  Medication Sig Dispense Refill  . aspirin 81 MG chewable tablet Chew 1 tablet (81 mg total) by mouth daily.    Marland Kitchen atorvastatin (LIPITOR) 20 MG tablet Take 1 tablet (20 mg total) by mouth at bedtime. 30 tablet 1  . benztropine (COGENTIN) 2 MG tablet Take 1 tablet (2 mg total) by mouth at bedtime. 30 tablet 0  . clonazePAM (KLONOPIN) 0.5 MG tablet Take 1 tablet (0.5 mg total) by mouth at bedtime. 30 tablet 0  . cloZAPine (CLOZARIL) 100 MG tablet Take 1 tablet (100 mg total) by mouth 2 (two) times daily. 60 tablet 0  . clozapine (CLOZARIL) 200 MG tablet Take 1 tablet (200 mg total) by mouth at bedtime. 30 tablet 0  . FLUoxetine (PROZAC) 20 MG capsule Take 1 capsule (20 mg total) by mouth daily. 30 capsule 0  . folic acid (FOLVITE) 1 MG tablet Take 1 tablet (1 mg  total) by mouth daily. 30 tablet 1  . levETIRAcetam (KEPPRA XR) 500 MG 24 hr tablet Take 1 tablet (500 mg total) by mouth 2 (two) times daily. 60 tablet 12  . levothyroxine (SYNTHROID, LEVOTHROID) 50 MCG tablet Take 1 tablet (50 mcg total) by mouth daily before breakfast. For underactive thyroid 30 tablet 0  . omeprazole (PRILOSEC) 40 MG capsule Take 1 capsule (40 mg total) by mouth daily. 30 capsule 1   No facility-administered medications prior to visit.     PAST MEDICAL  HISTORY: Past Medical History:  Diagnosis Date  . Anemia 01/14/2014  . Anxiety attack   . Chronic kidney disease   . Depression   . H/O: GI bleed   . Hyperlipidemia   . Schizoaffective disorder   . Seizures ([place])     PAST SURGICAL HISTORY: Past Surgical History:  Procedure Laterality Date  . ESOPHAGOGASTRODUODENOSCOPY  11/01/2011   Procedure: ESOPHAGOGASTRODUODENOSCOPY (EGD);  Surgeon: Beryle Beams;  Location: [facility];  Service: Endoscopy;  Laterality: N/A;  . MOLE REMOVAL    . WISDOM TOOTH EXTRACTION      FAMILY HISTORY: Family History  Problem Relation Age of Onset  . Adopted: Yes    SOCIAL HISTORY:  Social History   Social History  . Marital status: Single    Spouse name: N/A  . Number of children: 0  . Years of education: College   Occupational History  .  Other   Social History Main Topics  . Smoking status: Never Smoker  . Smokeless tobacco: Never Used  . Alcohol use No  . Drug use: No  . Sexual activity: No   Other Topics Concern  . Not on file   Social History Narrative   Patient lives in [place].   Right handed.   Education  Two years of college.Caffeine Use: 2 cups daily  Coke cola.      PHYSICAL EXAM  GENERAL EXAM/CONSTITUTIONAL: Vitals:  Vitals:   09/03/16 0953  BP: 102/72  Pulse: (!) 110  Weight: 240 lb 6.4 oz (109 kg)     Body mass index is 29.26 kg/m.  No exam data present  Patient is in no distress; well developed, nourished and groomed; neck is supple  CARDIOVASCULAR:  Examination of carotid arteries is normal; no carotid bruits  TACHYCARDIA, REGULAR rhythm, no murmurs  Examination of peripheral vascular system by observation and palpation is normal  EYES:  Ophthalmoscopic exam of optic discs and posterior segments is normal; no papilledema or hemorrhages  MUSCULOSKELETAL:  Gait, strength, tone, movements noted in Neurologic exam below  NEUROLOGIC: MENTAL STATUS:  No flowsheet data  found.  awake, alert, oriented to person, place and time  [facility] EYE CONTACT  recent and remote memory intact  normal attention and concentration  SLOW SPEECH; language fluent, comprehension intact, naming intact,   fund of knowledge appropriate  CRANIAL NERVE:   2nd - no papilledema on fundoscopic exam  2nd, 3rd, 4th, 6th - pupils equal and reactive to light, visual fields full to confrontation, extraocular muscles intact, no nystagmus  5th - facial sensation symmetric  7th - facial strength symmetric  8th - hearing intact  9th - palate elevates symmetrically, uvula midline  11th - shoulder shrug symmetric  12th - tongue protrusion midline  MOTOR:   normal bulk and tone, full strength in the BUE, BLE  SENSORY:   normal and symmetric to light touch, temperature, vibration  COORDINATION:   finger-nose-finger, fine finger movements normal  REFLEXES:   deep tendon reflexes present and symmetric  GAIT/STATION:   narrow based gait; able to walk on toes, heels and tandem; romberg is negative    DIAGNOSTIC DATA (LABS, IMAGING, TESTING) - I reviewed patient records, labs, notes, testing and imaging myself where available.  Lab Results  Component Value Date   WBC 5.9 01/03/2016   HGB 13.5 01/03/2016   HCT 39.8 01/03/2016   MCV 88.2 01/03/2016   PLT 151 01/03/2016      Component Value Date/Time   NA 143 04/24/2014 0645   K 4.3 04/24/2014 0645   CL 105 04/24/2014 0645   CO2 26 04/24/2014 0645   GLUCOSE 100 (H) 04/24/2014 0645   BUN 16 04/24/2014 0645   CREATININE 1.24 04/24/2014 0645   CREATININE 1.36 (H) 02/25/2014 1109   CALCIUM 8.7 04/24/2014 0645   PROT 6.6 04/24/2014 0645   ALBUMIN 3.5 04/24/2014 0645   AST 13 04/24/2014 0645   ALT 11 04/24/2014 0645   ALKPHOS 111 04/24/2014 0645   BILITOT 0.2 (L) 04/24/2014 0645   GFRNONAA 67 (L) 04/24/2014 0645   GFRAA 78 (L) 04/24/2014 0645   Lab Results  Component Value Date   CHOL 139 10/20/2009    HDL 37 (L) 10/20/2009   LDLCALC 74 10/20/2009   TRIG 176 (H) 01/06/2014   CHOLHDL 3.8 Ratio 10/20/2009   Lab Results  Component Value Date   HGBA1C 5.6 02/25/2014   No results found for: VITAMINB12 Lab Results  Component Value Date   TSH 17.420 (H) 04/23/2014    01/03/14 EEG  - normal asleep  01/07/14 MRI brain  - No acute or focal intracranial finding. Brain atrophy premature for age.     ASSESSMENT AND PLAN  50 y.o. year old male here with schizoaffective disorder, with new onset possible seizure (2015) in setting of acute renal failure. Now doing well with gradually improving renal function. Tolerating LEV XR 500mg  BID and no further seizures.  Dx:  Seizures ([place])    PLAN: I spent 15 minutes of face to face time with patient. Greater than 50% of time was spent in counseling and coordination of care with patient. In summary we discussed: - continue LEV XR 500mg  BID  Return in about 1 year (around 09/03/2017).    Penni Bombard, MD [id number], [id number] AM Certified in Neurology, Neurophysiology and Neuroimaging  [facility] [address] [place], [place] [address] [phone]

## 2017-01-08 ENCOUNTER — Encounter (HOSPITAL_COMMUNITY): Payer: Self-pay | Admitting: *Deleted

## 2017-01-08 ENCOUNTER — Emergency Department (HOSPITAL_COMMUNITY)
Admission: EM | Admit: 2017-01-08 | Discharge: 2017-01-11 | Disposition: A | Discharge status: Home / Self Care | Payer: Medicare Other | Attending: Emergency Medicine | Admitting: Emergency Medicine

## 2017-01-08 DIAGNOSIS — Z79899 Other long term (current) drug therapy: Secondary | ICD-10-CM | POA: Insufficient documentation

## 2017-01-08 DIAGNOSIS — Z7982 Long term (current) use of aspirin: Secondary | ICD-10-CM | POA: Insufficient documentation

## 2017-01-08 DIAGNOSIS — N183 Chronic kidney disease, stage 3 (moderate): Secondary | ICD-10-CM | POA: Diagnosis not present

## 2017-01-08 DIAGNOSIS — Z9889 Other specified postprocedural states: Secondary | ICD-10-CM | POA: Diagnosis not present

## 2017-01-08 DIAGNOSIS — R440 Auditory hallucinations: Secondary | ICD-10-CM | POA: Diagnosis not present

## 2017-01-08 DIAGNOSIS — I129 Hypertensive chronic kidney disease with stage 1 through stage 4 chronic kidney disease, or unspecified chronic kidney disease: Secondary | ICD-10-CM | POA: Diagnosis not present

## 2017-01-08 DIAGNOSIS — F251 Schizoaffective disorder, depressive type: Secondary | ICD-10-CM | POA: Diagnosis not present

## 2017-01-08 DIAGNOSIS — F4001 Agoraphobia with panic disorder: Secondary | ICD-10-CM | POA: Diagnosis present

## 2017-01-08 DIAGNOSIS — R443 Hallucinations, unspecified: Secondary | ICD-10-CM

## 2017-01-08 LAB — COMPREHENSIVE METABOLIC PANEL
ALT: 25 U/L (ref 17–63)
AST: 52 U/L — AB (ref 15–41)
Albumin: 4.6 g/dL (ref 3.5–5.0)
Alkaline Phosphatase: 110 U/L (ref 38–126)
Anion gap: 13 (ref 5–15)
BILIRUBIN TOTAL: 0.8 mg/dL (ref 0.3–1.2)
BUN: 16 mg/dL (ref 6–20)
CO2: 19 mmol/L — ABNORMAL LOW (ref 22–32)
CREATININE: 1.58 mg/dL — AB (ref 0.61–1.24)
Calcium: 8.6 mg/dL — ABNORMAL LOW (ref 8.9–10.3)
Chloride: 108 mmol/L (ref 101–111)
GFR, EST AFRICAN AMERICAN: 57 mL/min — AB (ref >60)
GFR, EST NON AFRICAN AMERICAN: 49 mL/min — AB (ref >60)
Glucose, Bld: 112 mg/dL — ABNORMAL HIGH (ref 65–99)
Potassium: 3.4 mmol/L — ABNORMAL LOW (ref 3.5–5.1)
Sodium: 140 mmol/L (ref 135–145)
TOTAL PROTEIN: 7.3 g/dL (ref 6.5–8.1)

## 2017-01-08 LAB — DIFFERENTIAL
Basophils Absolute: 0 10*3/uL (ref 0.0–0.1)
Basophils Relative: 0 %
EOS ABS: 0 10*3/uL (ref 0.0–0.7)
EOS PCT: 0 %
Lymphocytes Relative: 24 %
Lymphs Abs: 1.8 10*3/uL (ref 0.7–4.0)
MONO ABS: 0.8 10*3/uL (ref 0.1–1.0)
MONOS PCT: 10 %
NEUTROS PCT: 66 %
Neutro Abs: 5 10*3/uL (ref 1.7–7.7)

## 2017-01-08 LAB — RAPID URINE DRUG SCREEN, HOSP PERFORMED
AMPHETAMINES: NOT DETECTED
BARBITURATES: NOT DETECTED
BENZODIAZEPINES: NOT DETECTED
COCAINE: NOT DETECTED
Opiates: NOT DETECTED
TETRAHYDROCANNABINOL: NOT DETECTED

## 2017-01-08 LAB — TROPONIN I: Troponin I: <0.03 ng/mL (ref <0.03)

## 2017-01-08 LAB — CBC
HEMATOCRIT: 37.9 % — AB (ref 39.0–52.0)
HEMOGLOBIN: 13.4 g/dL (ref 13.0–17.0)
MCH: 30.2 pg (ref 26.0–34.0)
MCHC: 35.4 g/dL (ref 30.0–36.0)
MCV: 85.4 fL (ref 78.0–100.0)
Platelets: 164 10*3/uL (ref 150–400)
RBC: 4.44 MIL/uL (ref 4.22–5.81)
RDW: 12.8 % (ref 11.5–15.5)
WBC: 7.6 10*3/uL (ref 4.0–10.5)

## 2017-01-08 LAB — ETHANOL

## 2017-01-08 LAB — TSH: TSH: 3.822 u[IU]/mL (ref 0.350–4.500)

## 2017-01-08 MED ORDER — ASPIRIN 81 MG PO CHEW
81.0000 mg | CHEWABLE_TABLET | Freq: Every day | ORAL | Status: DC
  Administered 2017-01-08 – 2017-01-11 (×4): 81 mg via ORAL
  Filled 2017-01-08 (×4): qty 1

## 2017-01-08 MED ORDER — ATORVASTATIN CALCIUM 20 MG PO TABS
20.0000 mg | ORAL_TABLET | Freq: Every day | ORAL | Status: DC
  Administered 2017-01-08 – 2017-01-10 (×3): 20 mg via ORAL
  Filled 2017-01-08 (×3): qty 1

## 2017-01-08 MED ORDER — LEVETIRACETAM ER 500 MG PO TB24
500.0000 mg | ORAL_TABLET | Freq: Two times a day (BID) | ORAL | Status: DC
  Administered 2017-01-08 – 2017-01-11 (×6): 500 mg via ORAL
  Filled 2017-01-08 (×6): qty 1

## 2017-01-08 MED ORDER — BENZTROPINE MESYLATE 1 MG PO TABS
2.0000 mg | ORAL_TABLET | Freq: Every day | ORAL | Status: DC
  Administered 2017-01-08 – 2017-01-10 (×3): 2 mg via ORAL
  Filled 2017-01-08 (×3): qty 2

## 2017-01-08 MED ORDER — FLUOXETINE HCL 20 MG PO CAPS
20.0000 mg | ORAL_CAPSULE | Freq: Every day | ORAL | Status: DC
  Administered 2017-01-08 – 2017-01-11 (×4): 20 mg via ORAL
  Filled 2017-01-08 (×4): qty 1

## 2017-01-08 MED ORDER — CLONAZEPAM 0.5 MG PO TABS
0.5000 mg | ORAL_TABLET | Freq: Every day | ORAL | Status: DC
  Administered 2017-01-08 – 2017-01-10 (×3): 0.5 mg via ORAL
  Filled 2017-01-08 (×3): qty 1

## 2017-01-08 MED ORDER — FOLIC ACID 1 MG PO TABS
1.0000 mg | ORAL_TABLET | Freq: Every day | ORAL | Status: DC
  Administered 2017-01-08 – 2017-01-11 (×4): 1 mg via ORAL
  Filled 2017-01-08 (×4): qty 1

## 2017-01-08 MED ORDER — LEVOTHYROXINE SODIUM 50 MCG PO TABS
50.0000 ug | ORAL_TABLET | Freq: Every day | ORAL | Status: DC
  Administered 2017-01-09 – 2017-01-11 (×3): 50 ug via ORAL
  Filled 2017-01-08 (×3): qty 1

## 2017-01-08 MED ORDER — CLOZAPINE 100 MG PO TABS
100.0000 mg | ORAL_TABLET | Freq: Two times a day (BID) | ORAL | Status: DC
  Administered 2017-01-09 – 2017-01-11 (×6): 100 mg via ORAL
  Filled 2017-01-08 (×6): qty 1

## 2017-01-08 MED ORDER — PANTOPRAZOLE SODIUM 40 MG PO TBEC
80.0000 mg | DELAYED_RELEASE_TABLET | Freq: Every day | ORAL | Status: DC
  Administered 2017-01-08 – 2017-01-11 (×4): 80 mg via ORAL
  Filled 2017-01-08 (×5): qty 2

## 2017-01-08 MED ORDER — CLOZAPINE 100 MG PO TABS
200.0000 mg | ORAL_TABLET | Freq: Every day | ORAL | Status: DC
  Administered 2017-01-08 – 2017-01-10 (×3): 200 mg via ORAL
  Filled 2017-01-08 (×3): qty 2

## 2017-01-08 NOTE — ED Notes (Signed)
Pt stated "I live in a group home.  I hear voices.  They tell me to get up and walk".

## 2017-01-08 NOTE — ED Triage Notes (Signed)
Per EMS, pt w/ hx of schizophrenia complains of auditory hallucinatioins x 3 days. Pt denies SI/HI. Pt denies visual hallucinations. Pt is from group home. Group home supervisor states pt has not had any changes to medications. BP 159/103, HR 122, RR 20, SpO2 95% RA, CBG 130.

## 2017-01-08 NOTE — ED Provider Notes (Signed)
[facility] DEPT Provider Note   CSN: [id number] Arrival date & time: 01/08/17  1819     History   Chief Complaint Chief Complaint  Patient presents with  . Hallucinations    HPI Brady Hoffman is a 51 y.o. male.  HPI Patient presents to the emergency room for evaluation of auditory hallucinations. Patient has history of schizophrenia. He lives in a group home. Patient states that over the last few days he started hearing voices. These voices are telling him that they are spying on him and they are going to hurt him and they're going to give the medications that he doesn't need. Patient denies any suicidal or homicidal ideations. He denies any difficulties with fevers chills or other complaints.  Past Medical History:  Diagnosis Date  . Anemia 01/14/2014  . Anxiety attack   . Chronic kidney disease   . Depression   . H/O: GI bleed   . Hyperlipidemia   . Schizoaffective disorder   . Seizures [facility])     Patient Active Problem List   Diagnosis Date Noted  . Elevated CK 04/23/2014  . Seizure disorder ([place]) 04/23/2014  . CKD (chronic kidney disease), stage III 04/23/2014  . 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 Alcohol abuse 04/21/2014  . Low back pain 03/18/2014  . Encephalopathy 01/17/2014  . Anemia 01/14/2014  . Status epilepticus ([place]) 01/02/2014  . Altered mental status 01/02/2014  . Metabolic acidosis 123456  . Acute respiratory failure ([place]) 01/02/2014  . Schizoaffective disorder ([place]) 10/31/2011  . Acute renal failure ([place]) 10/31/2011  . Dyslipidemia 10/31/2011    Past Surgical History:  Procedure Laterality Date  . ESOPHAGOGASTRODUODENOSCOPY  11/01/2011   Procedure: ESOPHAGOGASTRODUODENOSCOPY (EGD);  Surgeon: Beryle Beams;  Location: [facility];  Service: Endoscopy;  Laterality: N/A;  . MOLE REMOVAL    . WISDOM TOOTH EXTRACTION         Home Medications    Prior to Admission medications   Medication Sig  Start Date End Date Taking? Authorizing Provider  acetaminophen (TYLENOL) 500 MG tablet Take 500-1,000 mg by mouth every 4 (four) hours as needed (pain).   Yes Historical Provider, MD  aspirin 81 MG chewable tablet Chew 1 tablet (81 mg total) by mouth daily. 05/04/14  Yes Niel Hummer, NP  atorvastatin (LIPITOR) 20 MG tablet Take 1 tablet (20 mg total) by mouth at bedtime. 05/04/14  Yes Niel Hummer, NP  benztropine (COGENTIN) 2 MG tablet Take 1 tablet (2 mg total) by mouth at bedtime. 05/04/14  Yes Niel Hummer, NP  clonazePAM (KLONOPIN) 0.5 MG tablet Take 1 tablet (0.5 mg total) by mouth at bedtime. 05/04/14  Yes Niel Hummer, NP  cloZAPine (CLOZARIL) 100 MG tablet Take 1 tablet (100 mg total) by mouth 2 (two) times daily. 05/04/14  Yes Niel Hummer, NP  clozapine (CLOZARIL) 200 MG tablet Take 1 tablet (200 mg total) by mouth at bedtime. 05/04/14  Yes Niel Hummer, NP  docusate sodium (COLACE) 100 MG capsule Take 100 mg by mouth 2 (two) times daily.   Yes Historical Provider, MD  FLUoxetine (PROZAC) 20 MG capsule Take 1 capsule (20 mg total) by mouth daily. 05/04/14  Yes Niel Hummer, NP  folic acid (FOLVITE) 1 MG tablet Take 1 tablet (1 mg total) by mouth daily. 05/04/14  Yes Niel Hummer, NP  levETIRAcetam (KEPPRA XR) 500 MG 24 hr tablet Take 1 tablet (500 mg total) by mouth 2 (two) times  daily. 05/04/14  Yes Niel Hummer, NP  levothyroxine (SYNTHROID, LEVOTHROID) 50 MCG tablet Take 1 tablet (50 mcg total) by mouth daily before breakfast. For underactive thyroid 05/04/14  Yes Niel Hummer, NP  omeprazole (PRILOSEC) 40 MG capsule Take 1 capsule (40 mg total) by mouth daily. 05/04/14  Yes Niel Hummer, NP  polyethylene glycol (MIRALAX / GLYCOLAX) packet Take 17 g by mouth daily.   Yes Historical Provider, MD    Family History Family History  Problem Relation Age of Onset  . Adopted: Yes    Social History Social History  Substance Use Topics  . Smoking status: Never Smoker  . Smokeless tobacco:  Never Used  . Alcohol use No     Allergies   Risperidone and related   Review of Systems Review of Systems  All other systems reviewed and are negative.    Physical Exam Updated Vital Signs BP 119/93 (BP Location: Right Arm)   Pulse 113   Temp 98.6 F (37 C) (Oral)   Resp 17   SpO2 97%   Physical Exam  Constitutional: No distress.  HENT:  Head: Normocephalic and atraumatic.  Right Ear: External ear normal.  Left Ear: External ear normal.  Eyes: Conjunctivae are normal. Right eye exhibits no discharge. Left eye exhibits no discharge. No scleral icterus.  Neck: Neck supple. No tracheal deviation present.  Cardiovascular: Regular rhythm and intact distal pulses.  Tachycardia present.   Pulmonary/Chest: Effort normal and breath sounds normal. No stridor. No respiratory distress. He has no wheezes. He has no rales.  Abdominal: Soft. Bowel sounds are normal. He exhibits no distension. There is no tenderness. There is no rebound and no guarding.  Musculoskeletal: He exhibits no edema or tenderness.  Neurological: He is alert. He has normal strength. No cranial nerve deficit (no facial droop, extraocular movements intact, no slurred speech) or sensory deficit. He exhibits normal muscle tone. He displays no seizure activity. Coordination normal.  Skin: Skin is warm and dry. No rash noted.  Psychiatric: His affect is blunt. His speech is not rapid and/or pressured, not tangential and not slurred. He is withdrawn. He expresses no homicidal and no suicidal ideation.  Nursing note and vitals reviewed.    ED Treatments / Results  Labs (all labs ordered are listed, but only abnormal results are displayed) Labs Reviewed  COMPREHENSIVE METABOLIC PANEL - Abnormal; Notable for the following:       Result Value   Potassium 3.4 (*)    CO2 19 (*)    Glucose, Bld 112 (*)    Creatinine, Ser 1.58 (*)    Calcium 8.6 (*)    AST 52 (*)    GFR calc non Af Amer 49 (*)    GFR calc Af Amer 57  (*)    All other components within normal limits  CBC - Abnormal; Notable for the following:    HCT 37.9 (*)    All other components within normal limits  ETHANOL  RAPID URINE DRUG SCREEN, HOSP PERFORMED  TSH  TROPONIN I  DIFFERENTIAL  T4, FREE  CBC WITH DIFFERENTIAL/PLATELET    Radiology No results found.  Procedures Procedures (including critical care time)  Medications Ordered in ED Medications  aspirin chewable tablet 81 mg (81 mg Oral Given 01/08/17 1928)  atorvastatin (LIPITOR) tablet 20 mg (20 mg Oral Given 01/08/17 2158)  benztropine (COGENTIN) tablet 2 mg (2 mg Oral Given 01/08/17 2157)  clonazePAM (KLONOPIN) tablet 0.5 mg (0.5 mg Oral Given 01/08/17  2158)  cloZAPine (CLOZARIL) tablet 100 mg (not administered)  cloZAPine (CLOZARIL) tablet 200 mg (200 mg Oral Given 01/08/17 2158)  FLUoxetine (PROZAC) capsule 20 mg (20 mg Oral Given [id number])  folic acid (FOLVITE) tablet 1 mg (1 mg Oral Given 01/08/17 1928)  levETIRAcetam (KEPPRA XR) 24 hr tablet 500 mg (500 mg Oral Given 01/08/17 2158)  levothyroxine (SYNTHROID, LEVOTHROID) tablet 50 mcg (not administered)  pantoprazole (PROTONIX) EC tablet 80 mg (80 mg Oral Given 01/08/17 1928)     Initial Impression / Assessment and Plan / ED Course  I have reviewed the triage vital signs and the nursing notes.  Pertinent labs & imaging results that were available during my care of the patient were reviewed by me and considered in my medical decision making (see chart for details).   Labs are reassuring.  Pt is medically clear.  Stable for psych assessment.  Final Clinical Impressions(s) / ED Diagnoses   Final diagnoses:  Hallucinations    New Prescriptions New Prescriptions   No medications on file     Dorie Rank, MD 01/08/17 2208

## 2017-01-08 NOTE — ED Notes (Signed)
Bed: WA27 Expected date:  Expected time:  Means of arrival:  Comments: 51 yo M hallucinations

## 2017-01-08 NOTE — ED Notes (Signed)
Patient is aware we need urine 

## 2017-01-09 LAB — T4, FREE: FREE T4: 1.15 ng/dL — AB (ref 0.61–1.12)

## 2017-01-09 MED ORDER — POTASSIUM CHLORIDE 20 MEQ PO PACK
20.0000 meq | PACK | Freq: Once | ORAL | Status: DC
Start: 2017-01-09 — End: 2017-01-09

## 2017-01-09 MED ORDER — POTASSIUM CHLORIDE CRYS ER 20 MEQ PO TBCR
20.0000 meq | EXTENDED_RELEASE_TABLET | Freq: Once | ORAL | Status: AC
  Administered 2017-01-09: 20 meq via ORAL
  Filled 2017-01-09: qty 1

## 2017-01-09 NOTE — ED Notes (Signed)
Patient sleeping soundly.  Tried to awaken patient to give medications, but he continued to sleep.  Breakfast tray at bedside.

## 2017-01-09 NOTE — ED Notes (Signed)
TTS assessment in progress. 

## 2017-01-09 NOTE — BH Assessment (Addendum)
Tele Assessment Note   Brady Hoffman is an 51 y.o. male.  -Clinician reviewed note from Dr. Tomi Bamberger.  Patient presents to the emergency room for evaluation of auditory hallucinations. Patient has history of schizophrenia. He lives in a group home. Patient states that over the last few days he started hearing voices. These voices are telling him that they are spying on him and they are going to hurt him and they're going to give the medications that he doesn't need. Patient denies any suicidal or homicidal ideations.  Patient says that he has been worrying about money lately and has been "very stressed."  Patient says that he has been hearing voices telling him that they are watching him or to check his medications.  Patient says that he has been stressed lately about whether he has enough money to live on his one.  Patient denies any SI, HI or visual hallucinations.  [facility] staff had called EMS to have patient brought to [facility].  Patient denies use of ETOH or other illicit drugs.  Patient has [name] as his psychiatrist.  He had been at [facility] in 2015.  -Clinician to discuss patient care with Patriciaann Clan, PA.  Pt to be seen by psychiatry in AM.  Diagnosis: Schizoaffective d/o  Past Medical History:  Past Medical History:  Diagnosis Date  . Anemia 01/14/2014  . Anxiety attack   . Chronic kidney disease   . Depression   . H/O: GI bleed   . Hyperlipidemia   . Schizoaffective disorder   . Seizures ([place])     Past Surgical History:  Procedure Laterality Date  . ESOPHAGOGASTRODUODENOSCOPY  11/01/2011   Procedure: ESOPHAGOGASTRODUODENOSCOPY (EGD);  Surgeon: Beryle Beams;  [facility];  Service: Endoscopy;  Laterality: N/A;  . MOLE REMOVAL    . WISDOM TOOTH EXTRACTION      Family History:  Family History  Problem Relation Age of Onset  . Adopted: Yes    Social History:  reports that he has never smoked. He has never used smokeless tobacco. He reports that he does not  drink alcohol or use drugs.  Additional Social History:  Alcohol / Drug Use Pain Medications: See PTA medication list Prescriptions: See PTA medication list Over the Counter: See PTA medication list History of alcohol / drug use?: No history of alcohol / drug abuse  CIWA: CIWA-Ar BP: 119/93 Pulse Rate: 113 COWS:    PATIENT STRENGTHS: (choose at least two) Communication skills Supportive family/friends  Allergies:  Allergies  Allergen Reactions  . Risperidone And Related Other (See Comments)    Pt. States head feels like a rock    Home Medications:  (Not in a hospital admission)  OB/GYN Status:  No LMP for male patient.  General Assessment Data [facility] of [facility]: [facility] TTS Assessment: In system Is this a Tele or Face-to-Face Assessment?: Face-to-Face Is this an Initial Assessment or a Re-assessment for this encounter?: Initial Assessment Marital status: Single Is patient pregnant?: No Pregnancy Status: No Living Arrangements: Group Home [facility] [phone]) Can pt return to current living arrangement?: Yes Admission Status: Voluntary Is patient capable of signing voluntary admission?: No Referral Source: Other [facility] staff called EMS to bring him over.) Insurance type: [facility]     Crisis Care Plan Living Arrangements: Group Home [facility] [phone]) Name of Psychiatrist: [name] Name of Therapist: None  Education Status Is patient currently in school?: No Highest grade of school patient has completed: Unknown  Risk to self  with the past 6 months Suicidal Ideation: No Has patient been a risk to self within the past 6 months prior to admission? : No Suicidal Intent: No Has patient had any suicidal intent within the past 6 months prior to admission? : No Is patient at risk for suicide?: No Suicidal Plan?: No Has patient had any suicidal plan within the past 6 months prior to admission? : No Access to Means: No What has been your use of  drugs/alcohol within the last 12 months?: Pt denies Previous Attempts/Gestures: No How many times?: 0 Other Self Harm Risks: None Triggers for Past Attempts: None known Intentional Self Injurious Behavior: None Family Suicide History: No Recent stressful life event(s): Financial Problems Persecutory voices/beliefs?: Yes Depression: No Depression Symptoms:  (Pt denies depressive symptoms) Substance abuse history and/or treatment for substance abuse?: No Suicide prevention information given to non-admitted patients: Not applicable  Risk to Others within the past 6 months Homicidal Ideation: No Does patient have any lifetime risk of violence toward others beyond the six months prior to admission? : No Thoughts of Harm to Others: No Current Homicidal Intent: No Current Homicidal Plan: No Access to Homicidal Means: No Identified Victim: None History of harm to others?: No Assessment of Violence: None Noted Violent Behavior Description: None reported Does patient have access to weapons?: No Criminal Charges Pending?: No Does patient have a court date: No Is patient on probation?: No  Psychosis Hallucinations: Auditory (Voices telling him to check on his medications.) Delusions: None noted  Mental Status Report Appearance/Hygiene: Unremarkable, In scrubs Eye Contact: Poor Motor Activity: Freedom of movement, Unremarkable Speech: Logical/coherent Level of Consciousness: Drowsy Mood: Anxious Affect: Anxious Anxiety Level: Moderate Thought Processes: Coherent, Relevant Judgement: Unimpaired Orientation: Person, Place, Situation Obsessive Compulsive Thoughts/Behaviors: None  Cognitive Functioning Concentration: Decreased Memory: Recent Impaired, Remote Intact IQ: Below Average Level of Function: Mild I/DD Insight: Poor Impulse Control: Poor Appetite: Good Weight Loss: 0 Weight Gain: 0 Sleep: Decreased Total Hours of Sleep:  (<4H/D) Vegetative Symptoms:  None  ADLScreening [facility] Assessment Services) Patient's cognitive ability adequate to safely complete daily activities?: Yes Patient able to express need for assistance with ADLs?: Yes Independently performs ADLs?: Yes (appropriate for developmental age)  Prior Inpatient Therapy Prior Inpatient Therapy: Yes Prior Therapy Dates: 2.5 years ago Prior Therapy Facilty/Provider(s): [facility]? Reason for Treatment: pt unsure  Prior Outpatient Therapy Prior Outpatient Therapy: Yes Prior Therapy Dates: Past 2 years Prior Therapy Facilty/Provider(s): [name] Reason for Treatment: med management Does patient have an ACCT team?: No Does patient have Intensive In-House Services?  : No Does patient have [facility] services? : No Does patient have P4CC services?: No  ADL Screening (condition at time of admission) Patient's cognitive ability adequate to safely complete daily activities?: Yes Is the patient deaf or have difficulty hearing?: No Does the patient have difficulty seeing, even when wearing glasses/contacts?: No Does the patient have difficulty concentrating, remembering, or making decisions?: Yes Patient able to express need for assistance with ADLs?: Yes Does the patient have difficulty dressing or bathing?: No Independently performs ADLs?: Yes (appropriate for developmental age) Does the patient have difficulty walking or climbing stairs?: Yes (Pt says he sometimes gets dizzy.) Weakness of Legs: None Weakness of Arms/Hands: None       Abuse/Neglect Assessment (Assessment to be complete while patient is alone) Physical Abuse: Denies Verbal Abuse: Denies Sexual Abuse: Denies Exploitation of patient/patient's resources: Denies Self-Neglect: Denies     Regulatory affairs officer (For Healthcare) Does Patient Have a Medical  Advance Directive?: No Would patient like information on creating a medical advance directive?: No - Patient declined    Additional Information 1:1 In Past 12  Months?: No CIRT Risk: No Elopement Risk: No Does patient have medical clearance?: Yes     Disposition:  Disposition Initial Assessment Completed for this Encounter: Yes Disposition of Patient: Other dispositions (Pt to be reviewed by PA) Other disposition(s): Other (Comment) (To be reviewed by PA)  Brady Hoffman 01/09/2017 1:37 AM

## 2017-01-09 NOTE — ED Notes (Signed)
MD and NP making rounds.  In patient's room.

## 2017-01-10 DIAGNOSIS — Z7982 Long term (current) use of aspirin: Secondary | ICD-10-CM | POA: Diagnosis not present

## 2017-01-10 DIAGNOSIS — Z79899 Other long term (current) drug therapy: Secondary | ICD-10-CM | POA: Diagnosis not present

## 2017-01-10 DIAGNOSIS — F251 Schizoaffective disorder, depressive type: Secondary | ICD-10-CM | POA: Diagnosis not present

## 2017-01-10 LAB — BASIC METABOLIC PANEL
ANION GAP: 8 (ref 5–15)
BUN: 12 mg/dL (ref 6–20)
CALCIUM: 8.6 mg/dL — AB (ref 8.9–10.3)
CHLORIDE: 108 mmol/L (ref 101–111)
CO2: 25 mmol/L (ref 22–32)
CREATININE: 1.33 mg/dL — AB (ref 0.61–1.24)
GFR calc non Af Amer: >60 mL/min (ref >60)
Glucose, Bld: 126 mg/dL — ABNORMAL HIGH (ref 65–99)
Potassium: 3.5 mmol/L (ref 3.5–5.1)
SODIUM: 141 mmol/L (ref 135–145)

## 2017-01-10 NOTE — ED Notes (Signed)
Blood sample collected, pt denies pain at this time.

## 2017-01-10 NOTE — Consult Note (Signed)
[place] Psychiatry Consult   Reason for Consult:  EDP Referring Physician:   Hearing voices Patient Identification: Brady Hoffman MRN:  [id number] Principal Diagnosis: Schizoaffective disorder, depressive type [facility]) Diagnosis:   Patient Active Problem List   Diagnosis Date Noted  . Elevated CK [R74.8] 04/23/2014  . Seizure disorder ([place]) [K08.811] 04/23/2014  . CKD (chronic kidney disease), stage III [N18.3] 04/23/2014  . 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 Alcohol abuse [F10.10] 04/21/2014  . Low back pain [M54.5] 03/18/2014  . Encephalopathy [G93.40] 01/17/2014  . Anemia [D64.9] 01/14/2014  . Status epilepticus ([place]) [G40.901] 01/02/2014  . Altered mental status [R41.82] 01/02/2014  . Metabolic acidosis [S31.5] 01/02/2014  . Acute respiratory failure ([place]) [J96.00] 01/02/2014  . Schizoaffective disorder, depressive type ([place]) [F25.1] 10/31/2011  . Acute renal failure ([place]) [N17.9] 10/31/2011  . Dyslipidemia [E78.5] 10/31/2011    Total Time spent with patient: 30 minutes  Subjective:   Brady Hoffman is a 51 y.o. male patient admitted with auditory hallucinations.  HPI:  Brady Hoffman is an 51 y.o. male who presents to the ER for evaluation of auditory hallucinations. Patient has history of schizophrenia. He lives in a group home. Patient states that over the last few days he started hearing voices. These voices are telling him that they are spying on him and they are going to hurt him and they're going to give the medications that he doesn't need.   Patient's mother in the room with him.  She confirmed that patient had been worrying about money lately and has been "very stressed  whether he has enough money to live and wants to buy a house.."  Patient says that he has been hearing voices telling him that they are watching him or to check his medications.  However, states they are better today.  Patient denies any  suicidal or homicidal ideations currently.  He does appear that he is clearing up.    Mother states that patient infact had not slept in 3 days and discussed that this could be the main contributing factor to his symptoms.  Patient denies use of ETOH or other illicit drugs.  Patient has [name] as his psychiatrist.  He had been at [facility] in 2015.  Past Psychiatric History: see HPI  Risk to Self: Suicidal Ideation: No Suicidal Intent: No Is patient at risk for suicide?: No Suicidal Plan?: No Access to Means: No What has been your use of drugs/alcohol within the last 12 months?: Pt denies How many times?: 0 Other Self Harm Risks: None Triggers for Past Attempts: None known Intentional Self Injurious Behavior: None Risk to Others: Homicidal Ideation: No Thoughts of Harm to Others: No Current Homicidal Intent: No Current Homicidal Plan: No Access to Homicidal Means: No Identified Victim: None History of harm to others?: No Assessment of Violence: None Noted Violent Behavior Description: None reported Does patient have access to weapons?: No Criminal Charges Pending?: No Does patient have a court date: No Prior Inpatient Therapy: Prior Inpatient Therapy: Yes Prior Therapy Dates: 2.5 years ago Prior Therapy Facilty/Provider(s): [facility]? Reason for Treatment: pt unsure Prior Outpatient Therapy: Prior Outpatient Therapy: Yes Prior Therapy Dates: Past 2 years Prior Therapy Facilty/Provider(s): [name] Reason for Treatment: med management Does patient have an ACCT team?: No Does patient have Intensive In-House Services?  : No Does patient have [facility] services? : No Does patient have P4CC services?: No  Past Medical History:  Past Medical History:  Diagnosis Date  .  Anemia 01/14/2014  . Anxiety attack   . Chronic kidney disease   . Depression   . H/O: GI bleed   . Hyperlipidemia   . Schizoaffective disorder   . Seizures ([place])     Past Surgical History:  Procedure  Laterality Date  . ESOPHAGOGASTRODUODENOSCOPY  11/01/2011   Procedure: ESOPHAGOGASTRODUODENOSCOPY (EGD);  Surgeon: Beryle Beams;  [facility];  Service: Endoscopy;  Laterality: N/A;  . MOLE REMOVAL    . WISDOM TOOTH EXTRACTION     Family History:  Family History  Problem Relation Age of Onset  . Adopted: Yes   Family Psychiatric  History: see HPI Social History:  History  Alcohol Use No     History  Drug Use No    Social History   Social History  . Marital status: Single    Spouse name: N/A  . Number of children: 0  . Years of education: College   Occupational History  .  Other   Social History Main Topics  . Smoking status: Never Smoker  . Smokeless tobacco: Never Used  . Alcohol use No  . Drug use: No  . Sexual activity: No   Other Topics Concern  . None   Social History Narrative   Patient lives in [place].   Right handed.   Education  Two years of college.Caffeine Use: 2 cups daily  Coke cola.    Additional Social History:    Allergies:   Allergies  Allergen Reactions  . Risperidone And Related Other (See Comments)    Pt. States head feels like a rock    Labs:  Results for orders placed or performed during the hospital encounter of 01/08/17 (from the past 48 hour(s))  Comprehensive metabolic panel     Status: Abnormal   Collection Time: 01/08/17  6:44 PM  Result Value Ref Range   Sodium 140 135 - 145 mmol/L   Potassium 3.4 (L) 3.5 - 5.1 mmol/L   Chloride 108 101 - 111 mmol/L   CO2 19 (L) 22 - 32 mmol/L   Glucose, Bld 112 (H) 65 - 99 mg/dL   BUN 16 6 - 20 mg/dL   Creatinine, Ser 1.58 (H) 0.61 - 1.24 mg/dL   Calcium 8.6 (L) 8.9 - 10.3 mg/dL   Total Protein 7.3 6.5 - 8.1 g/dL   Albumin 4.6 3.5 - 5.0 g/dL   AST 52 (H) 15 - 41 U/L   ALT 25 17 - 63 U/L   Alkaline Phosphatase 110 38 - 126 U/L   Total Bilirubin 0.8 0.3 - 1.2 mg/dL   GFR calc non Af Amer 49 (L) >60 mL/min   GFR calc Af Amer 57 (L) >60 mL/min     Comment: (NOTE) The eGFR has been calculated using the CKD EPI equation. This calculation has not been validated in all clinical situations. eGFR's persistently <60 mL/min signify possible Chronic Kidney Disease.    Anion gap 13 5 - 15  Ethanol     Status: None   Collection Time: 01/08/17  6:44 PM  Result Value Ref Range   Alcohol, Ethyl (B) <5 <5 mg/dL    Comment:        LOWEST DETECTABLE LIMIT FOR SERUM ALCOHOL IS 5 mg/dL FOR MEDICAL PURPOSES ONLY   cbc     Status: Abnormal   Collection Time: 01/08/17  6:44 PM  Result Value Ref Range   WBC 7.6 4.0 - 10.5 K/uL   RBC 4.44 4.22 - 5.81 MIL/uL  Hemoglobin 13.4 13.0 - 17.0 g/dL   HCT 37.9 (L) 39.0 - 52.0 %   MCV 85.4 78.0 - 100.0 fL   MCH 30.2 26.0 - 34.0 pg   MCHC 35.4 30.0 - 36.0 g/dL   RDW 12.8 11.5 - 15.5 %   Platelets 164 150 - 400 K/uL  TSH     Status: None   Collection Time: 01/08/17  6:44 PM  Result Value Ref Range   TSH 3.822 0.350 - 4.500 uIU/mL    Comment: Performed by a 3rd Generation assay with a functional sensitivity of <=0.01 uIU/mL.  T4, free     Status: Abnormal   Collection Time: 01/08/17  6:44 PM  Result Value Ref Range   Free T4 1.15 (H) 0.61 - 1.12 ng/dL    Comment: (NOTE) Biotin ingestion may interfere with free T4 tests. If the results are inconsistent with the TSH level, previous test results, or the clinical presentation, then consider biotin interference. If needed, order repeat testing after stopping biotin. Performed at [facility], [place] [address], [place], [place] [address]   Differential     Status: None   Collection Time: 01/08/17  6:44 PM  Result Value Ref Range   Neutrophils Relative % 66 %   Neutro Abs 5.0 1.7 - 7.7 K/uL   Lymphocytes Relative 24 %   Lymphs Abs 1.8 0.7 - 4.0 K/uL   Monocytes Relative 10 %   Monocytes Absolute 0.8 0.1 - 1.0 K/uL   Eosinophils Relative 0 %   Eosinophils Absolute 0.0 0.0 - 0.7 K/uL   Basophils Relative 0 %   Basophils Absolute 0.0 0.0 - 0.1  K/uL  Rapid urine drug screen (hospital performed)     Status: None   Collection Time: 01/08/17  7:58 PM  Result Value Ref Range   Opiates NONE DETECTED NONE DETECTED   Cocaine NONE DETECTED NONE DETECTED   Benzodiazepines NONE DETECTED NONE DETECTED   Amphetamines NONE DETECTED NONE DETECTED   Tetrahydrocannabinol NONE DETECTED NONE DETECTED   Barbiturates NONE DETECTED NONE DETECTED    Comment:        DRUG SCREEN FOR MEDICAL PURPOSES ONLY.  IF CONFIRMATION IS NEEDED FOR ANY PURPOSE, NOTIFY LAB WITHIN 5 DAYS.        LOWEST DETECTABLE LIMITS FOR URINE DRUG SCREEN Drug Class       Cutoff (ng/mL) Amphetamine      1000 Barbiturate      200 Benzodiazepine   010 Tricyclics       071 Opiates          300 Cocaine          300 THC              50   Troponin I     Status: None   Collection Time: 01/08/17  8:40 PM  Result Value Ref Range   Troponin I <0.03 <0.03 ng/mL  Basic metabolic panel     Status: Abnormal   Collection Time: 01/10/17  5:50 AM  Result Value Ref Range   Sodium 141 135 - 145 mmol/L   Potassium 3.5 3.5 - 5.1 mmol/L   Chloride 108 101 - 111 mmol/L   CO2 25 22 - 32 mmol/L   Glucose, Bld 126 (H) 65 - 99 mg/dL   BUN 12 6 - 20 mg/dL   Creatinine, Ser 1.33 (H) 0.61 - 1.24 mg/dL   Calcium 8.6 (L) 8.9 - 10.3 mg/dL   GFR calc non Af Amer >  60 >60 mL/min   GFR calc Af Amer >60 >60 mL/min    Comment: (NOTE) The eGFR has been calculated using the CKD EPI equation. This calculation has not been validated in all clinical situations. eGFR's persistently <60 mL/min signify possible Chronic Kidney Disease.    Anion gap 8 5 - 15    Current Facility-Administered Medications  Medication Dose Route Frequency Provider Last Rate Last Dose  . aspirin chewable tablet 81 mg  81 mg Oral Daily Dorie Rank, MD   81 mg at 01/10/17 0801  . atorvastatin (LIPITOR) tablet 20 mg  20 mg Oral QHS Dorie Rank, MD   20 mg at 01/09/17 2128  . benztropine (COGENTIN) tablet 2 mg  2 mg Oral QHS Dorie Rank, MD   2 mg at 01/09/17 2128  . clonazePAM (KLONOPIN) tablet 0.5 mg  0.5 mg Oral QHS Dorie Rank, MD   0.5 mg at 01/09/17 2128  . cloZAPine (CLOZARIL) tablet 100 mg  100 mg Oral BID WC Dorie Rank, MD   100 mg at 01/10/17 0759  . cloZAPine (CLOZARIL) tablet 200 mg  200 mg Oral QHS Dorie Rank, MD   200 mg at 01/09/17 2128  . FLUoxetine (PROZAC) capsule 20 mg  20 mg Oral Daily Dorie Rank, MD   20 mg at 01/10/17 0758  . folic acid (FOLVITE) tablet 1 mg  1 mg Oral Daily Dorie Rank, MD   1 mg at 01/10/17 0800  . levETIRAcetam (KEPPRA XR) 24 hr tablet 500 mg  500 mg Oral BID Dorie Rank, MD   500 mg at 01/10/17 1000  . levothyroxine (SYNTHROID, LEVOTHROID) tablet 50 mcg  50 mcg Oral QAC breakfast Dorie Rank, MD   50 mcg at 01/10/17 0800  . pantoprazole (PROTONIX) EC tablet 80 mg  80 mg Oral Daily Dorie Rank, MD   80 mg at 01/10/17 0800   Current Outpatient Prescriptions  Medication Sig Dispense Refill  . acetaminophen (TYLENOL) 500 MG tablet Take 500-1,000 mg by mouth every 4 (four) hours as needed (pain).    Marland Kitchen aspirin 81 MG chewable tablet Chew 1 tablet (81 mg total) by mouth daily.    Marland Kitchen atorvastatin (LIPITOR) 20 MG tablet Take 1 tablet (20 mg total) by mouth at bedtime. 30 tablet 1  . benztropine (COGENTIN) 2 MG tablet Take 1 tablet (2 mg total) by mouth at bedtime. 30 tablet 0  . clonazePAM (KLONOPIN) 0.5 MG tablet Take 1 tablet (0.5 mg total) by mouth at bedtime. 30 tablet 0  . cloZAPine (CLOZARIL) 100 MG tablet Take 1 tablet (100 mg total) by mouth 2 (two) times daily. 60 tablet 0  . clozapine (CLOZARIL) 200 MG tablet Take 1 tablet (200 mg total) by mouth at bedtime. 30 tablet 0  . docusate sodium (COLACE) 100 MG capsule Take 100 mg by mouth 2 (two) times daily.    Marland Kitchen FLUoxetine (PROZAC) 20 MG capsule Take 1 capsule (20 mg total) by mouth daily. 30 capsule 0  . folic acid (FOLVITE) 1 MG tablet Take 1 tablet (1 mg total) by mouth daily. 30 tablet 1  . levETIRAcetam (KEPPRA XR) 500 MG 24 hr tablet Take 1  tablet (500 mg total) by mouth 2 (two) times daily. 60 tablet 12  . levothyroxine (SYNTHROID, LEVOTHROID) 50 MCG tablet Take 1 tablet (50 mcg total) by mouth daily before breakfast. For underactive thyroid 30 tablet 0  . omeprazole (PRILOSEC) 40 MG capsule Take 1 capsule (40 mg total) by mouth daily. 30 capsule  1  . polyethylene glycol (MIRALAX / GLYCOLAX) packet Take 17 g by mouth daily.      Musculoskeletal: Strength & Muscle Tone: within normal limits Gait & Station: normal Patient leans: N/A  Psychiatric Specialty Exam: Physical Exam  Nursing note and vitals reviewed.   ROS  Blood pressure 94/73, pulse 96, temperature 98.7 F (37.1 C), temperature source Oral, resp. rate 18, SpO2 100 %.There is no height or weight on file to calculate BMI.  General Appearance: Casual  Eye Contact:  Good  Speech:  Clear and Coherent  Volume:  Normal  Mood:  Euthymic  Affect:  Appropriate  Thought Process:  Linear  Orientation:  Full (Time, Place, and Person)  Thought Content:  Logical  Suicidal Thoughts:  No  Homicidal Thoughts:  No  Memory:  Immediate;   Fair Recent;   Fair Remote;   Fair  Judgement:  Fair  Insight:  Fair  Psychomotor Activity:  Normal  Concentration:  Concentration: Fair and Attention Span: Fair  Recall:  [employer] of Knowledge:  Good  Language:  Good  Akathisia:  No  Handed:  Right  AIMS (if indicated):     Assets:  Desire for Improvement Resilience Social Support  ADL's:  Intact  Cognition:  WNL  Sleep:  better   Treatment Plan Summary: Daily contact with patient to assess and evaluate symptoms and progress in treatment, Medication management and Plan observe for another 24 hrs before returning to [facility].  Mother in agreement with plan  Disposition: No evidence of imminent risk to self or others at present.   Patient does not meet criteria for psychiatric inpatient admission. Supportive therapy provided about ongoing stressors. Dr Darleene Cleaver concurs with  plan.  Janett Labella, NP [facility] 01/10/2017 12:03 PM  Patient seen face-to-face for psychiatric evaluation, chart reviewed and case discussed with the physician extender and developed treatment plan. Reviewed the information documented and agree with the treatment plan. Corena Pilgrim, MD

## 2017-01-11 ENCOUNTER — Encounter (HOSPITAL_COMMUNITY): Payer: Self-pay | Admitting: Registered Nurse

## 2017-01-11 DIAGNOSIS — F251 Schizoaffective disorder, depressive type: Secondary | ICD-10-CM | POA: Diagnosis not present

## 2017-01-11 DIAGNOSIS — Z888 Allergy status to other drugs, medicaments and biological substances status: Secondary | ICD-10-CM | POA: Diagnosis not present

## 2017-01-11 DIAGNOSIS — F4001 Agoraphobia with panic disorder: Secondary | ICD-10-CM | POA: Diagnosis not present

## 2017-01-11 DIAGNOSIS — Z9889 Other specified postprocedural states: Secondary | ICD-10-CM | POA: Diagnosis not present

## 2017-01-11 DIAGNOSIS — R440 Auditory hallucinations: Secondary | ICD-10-CM | POA: Diagnosis not present

## 2017-01-11 DIAGNOSIS — R443 Hallucinations, unspecified: Secondary | ICD-10-CM | POA: Insufficient documentation

## 2017-01-11 DIAGNOSIS — Z79899 Other long term (current) drug therapy: Secondary | ICD-10-CM | POA: Diagnosis not present

## 2017-01-11 DIAGNOSIS — Z7982 Long term (current) use of aspirin: Secondary | ICD-10-CM | POA: Diagnosis not present

## 2017-01-11 MED ORDER — CLOZAPINE 100 MG PO TABS: 100.0000 mg | ORAL_TABLET | Freq: Two times a day (BID) | ORAL | 0 refills | Status: DC

## 2017-01-11 NOTE — Consult Note (Signed)
[place] Psychiatry Consult   Reason for Consult:  EDP Referring Physician:   Hearing voices Patient Identification: Brady Hoffman MRN:  [id number] Principal Diagnosis: Schizoaffective disorder, depressive type [facility]) Diagnosis:   Patient Active Problem List   Diagnosis Date Noted  . Elevated CK [R74.8] 04/23/2014  . Seizure disorder ([place]) [Q03.474] 04/23/2014  . CKD (chronic kidney disease), stage III [N18.3] 04/23/2014  . 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 Alcohol abuse [F10.10] 04/21/2014  . Low back pain [M54.5] 03/18/2014  . Encephalopathy [G93.40] 01/17/2014  . Anemia [D64.9] 01/14/2014  . Status epilepticus ([place]) [G40.901] 01/02/2014  . Altered mental status [R41.82] 01/02/2014  . Metabolic acidosis [Q59.5] 01/02/2014  . Acute respiratory failure ([place]) [J96.00] 01/02/2014  . Schizoaffective disorder, depressive type ([place]) [F25.1] 10/31/2011  . Acute renal failure ([place]) [N17.9] 10/31/2011  . Dyslipidemia [E78.5] 10/31/2011    Total Time spent with patient: 30 minutes  Subjective:   Brady Hoffman is a 51 y.o. male patient admitted with auditory hallucinations.  HPI:  Brady Hoffman is an 51 y.o. male who presents to the ER for evaluation of auditory hallucinations. Patient has history of schizophrenia. He lives in a group home. Patient states that over the last few days he started hearing voices. These voices are telling him that they are spying on him and they are going to hurt him and they're going to give the medications that he doesn't need.   Patient's mother in the room with him.  She confirmed that patient had been worrying about money lately and has been "very stressed  whether he has enough money to live and wants to buy a house.."  Patient says that he has been hearing voices telling him that they are watching him or to check his medications.  However, states they are better today.  Patient denies any  suicidal or homicidal ideations currently.  He does appear that he is clearing up.    Mother states that patient infact had not slept in 3 days and discussed that this could be the main contributing factor to his symptoms.  Patient denies use of ETOH or other illicit drugs.  Patient has [name] as his psychiatrist.  He had been at [facility] in 2015.   01/11/17:  Mal Amabile  seen by Dr. Louretta Shorten and this provider.  Chart reviewed 01/11/17.   On evaluation:  [name] reports that he is doing much better.  His mother is at his bedside.  Reports that when first admitted he was hearing voice telling that staff in the home was going to hurt him.  States that he is no longer hearing the voices and states that he slept well last night and he is tolerating his medications without adverse reaction.  Denies suicidal/homicidal ideation, paranoia, and auditory/visual hallucinations.  Mother states that she is comfortable taking her home with her.  States that he "comes home every other week end."  Patient has a follow up with [name] on February 04, 2017.    Past Psychiatric History: see HPI  Risk to Self: Suicidal Ideation: No Suicidal Intent: No Is patient at risk for suicide?: No Suicidal Plan?: No Access to Means: No What has been your use of drugs/alcohol within the last 12 months?: Pt denies How many times?: 0 Other Self Harm Risks: None Triggers for Past Attempts: None known Intentional Self Injurious Behavior: None Risk to Others: Homicidal Ideation: No Thoughts of Harm to Others: No Current Homicidal Intent: No  Current Homicidal Plan: No Access to Homicidal Means: No Identified Victim: None History of harm to others?: No Assessment of Violence: None Noted Violent Behavior Description: None reported Does patient have access to weapons?: No Criminal Charges Pending?: No Does patient have a court date: No Prior Inpatient Therapy: Prior Inpatient Therapy: Yes Prior  Therapy Dates: 2.5 years ago Prior Therapy Facilty/Provider(s): [place]? Reason for Treatment: pt unsure Prior Outpatient Therapy: Prior Outpatient Therapy: Yes Prior Therapy Dates: Past 2 years Prior Therapy Facilty/Provider(s): [name] Reason for Treatment: med management Does patient have an ACCT team?: No Does patient have Intensive In-House Services?  : No Does patient have [facility] services? : No Does patient have P4CC services?: No  Past Medical History:  Past Medical History:  Diagnosis Date  . Anemia 01/14/2014  . Anxiety attack   . Chronic kidney disease   . Depression   . H/O: GI bleed   . Hyperlipidemia   . Schizoaffective disorder   . Seizures ([place])     Past Surgical History:  Procedure Laterality Date  . ESOPHAGOGASTRODUODENOSCOPY  11/01/2011   Procedure: ESOPHAGOGASTRODUODENOSCOPY (EGD);  Surgeon: Beryle Beams;  Location: [facility];  Service: Endoscopy;  Laterality: N/A;  . MOLE REMOVAL    . WISDOM TOOTH EXTRACTION     Family History:  Family History  Problem Relation Age of Onset  . Adopted: Yes   Family Psychiatric  History: see HPI Social History:  History  Alcohol Use No     History  Drug Use No    Social History   Social History  . Marital status: Single    Spouse name: N/A  . Number of children: 0  . Years of education: College   Occupational History  .  Other   Social History Main Topics  . Smoking status: Never Smoker  . Smokeless tobacco: Never Used  . Alcohol use No  . Drug use: No  . Sexual activity: No   Other Topics Concern  . None   Social History Narrative   Patient lives in [place].   Right handed.   Education  Two years of college.Caffeine Use: 2 cups daily  Coke cola.    Additional Social History:    Allergies:   Allergies  Allergen Reactions  . Risperidone And Related Other (See Comments)    Pt. States head feels like a rock    Labs:  Results for orders placed or performed during the  hospital encounter of 01/08/17 (from the past 48 hour(s))  Basic metabolic panel     Status: Abnormal   Collection Time: 01/10/17  5:50 AM  Result Value Ref Range   Sodium 141 135 - 145 mmol/L   Potassium 3.5 3.5 - 5.1 mmol/L   Chloride 108 101 - 111 mmol/L   CO2 25 22 - 32 mmol/L   Glucose, Bld 126 (H) 65 - 99 mg/dL   BUN 12 6 - 20 mg/dL   Creatinine, Ser 1.33 (H) 0.61 - 1.24 mg/dL   Calcium 8.6 (L) 8.9 - 10.3 mg/dL   GFR calc non Af Amer >60 >60 mL/min   GFR calc Af Amer >60 >60 mL/min    Comment: (NOTE) The eGFR has been calculated using the CKD EPI equation. This calculation has not been validated in all clinical situations. eGFR's persistently <60 mL/min signify possible Chronic Kidney Disease.    Anion gap 8 5 - 15    Current Facility-Administered Medications  Medication Dose Route Frequency Provider Last Rate  Last Dose  . aspirin chewable tablet 81 mg  81 mg Oral Daily Dorie Rank, MD   81 mg at 01/11/17 0954  . atorvastatin (LIPITOR) tablet 20 mg  20 mg Oral QHS Dorie Rank, MD   20 mg at 01/10/17 2210  . benztropine (COGENTIN) tablet 2 mg  2 mg Oral QHS Dorie Rank, MD   2 mg at 01/10/17 2156  . clonazePAM (KLONOPIN) tablet 0.5 mg  0.5 mg Oral QHS Dorie Rank, MD   0.5 mg at 01/10/17 2156  . cloZAPine (CLOZARIL) tablet 100 mg  100 mg Oral BID WC Dorie Rank, MD   100 mg at 01/11/17 1210  . cloZAPine (CLOZARIL) tablet 200 mg  200 mg Oral QHS Dorie Rank, MD   200 mg at 01/10/17 2210  . FLUoxetine (PROZAC) capsule 20 mg  20 mg Oral Daily Dorie Rank, MD   20 mg at 01/11/17 0954  . folic acid (FOLVITE) tablet 1 mg  1 mg Oral Daily Dorie Rank, MD   1 mg at 01/11/17 0954  . levETIRAcetam (KEPPRA XR) 24 hr tablet 500 mg  500 mg Oral BID Dorie Rank, MD   500 mg at 01/11/17 0954  . levothyroxine (SYNTHROID, LEVOTHROID) tablet 50 mcg  50 mcg Oral QAC breakfast Dorie Rank, MD   50 mcg at 01/11/17 0849  . pantoprazole (PROTONIX) EC tablet 80 mg  80 mg Oral Daily Dorie Rank, MD   80 mg at 01/11/17 2542    Current Outpatient Prescriptions  Medication Sig Dispense Refill  . acetaminophen (TYLENOL) 500 MG tablet Take 500-1,000 mg by mouth every 4 (four) hours as needed (pain).    Marland Kitchen aspirin 81 MG chewable tablet Chew 1 tablet (81 mg total) by mouth daily.    Marland Kitchen atorvastatin (LIPITOR) 20 MG tablet Take 1 tablet (20 mg total) by mouth at bedtime. 30 tablet 1  . benztropine (COGENTIN) 2 MG tablet Take 1 tablet (2 mg total) by mouth at bedtime. 30 tablet 0  . clonazePAM (KLONOPIN) 0.5 MG tablet Take 1 tablet (0.5 mg total) by mouth at bedtime. 30 tablet 0  . cloZAPine (CLOZARIL) 100 MG tablet Take 1 tablet (100 mg total) by mouth 2 (two) times daily. 60 tablet 0  . clozapine (CLOZARIL) 200 MG tablet Take 1 tablet (200 mg total) by mouth at bedtime. 30 tablet 0  . docusate sodium (COLACE) 100 MG capsule Take 100 mg by mouth 2 (two) times daily.    Marland Kitchen FLUoxetine (PROZAC) 20 MG capsule Take 1 capsule (20 mg total) by mouth daily. 30 capsule 0  . folic acid (FOLVITE) 1 MG tablet Take 1 tablet (1 mg total) by mouth daily. 30 tablet 1  . levETIRAcetam (KEPPRA XR) 500 MG 24 hr tablet Take 1 tablet (500 mg total) by mouth 2 (two) times daily. 60 tablet 12  . levothyroxine (SYNTHROID, LEVOTHROID) 50 MCG tablet Take 1 tablet (50 mcg total) by mouth daily before breakfast. For underactive thyroid 30 tablet 0  . omeprazole (PRILOSEC) 40 MG capsule Take 1 capsule (40 mg total) by mouth daily. 30 capsule 1  . polyethylene glycol (MIRALAX / GLYCOLAX) packet Take 17 g by mouth daily.      Musculoskeletal: Strength & Muscle Tone: within normal limits Gait & Station: normal Patient leans: N/A  Psychiatric Specialty Exam: Physical Exam  Nursing note and vitals reviewed. Constitutional: He is oriented to person, place, and time.  Neck: Normal range of motion.  Respiratory: Effort normal.  Musculoskeletal: Normal range  of motion.  Neurological: He is alert and oriented to person, place, and time.    Review of  Systems  Psychiatric/Behavioral: Negative for depression, hallucinations (denies at this time), substance abuse and suicidal ideas. The patient is not nervous/anxious and does not have insomnia.   All other systems reviewed and are negative.   Blood pressure 121/85, pulse 98, temperature 97.4 F (36.3 C), temperature source Oral, resp. rate 18, height _0  (1.93 m), weight 108.9 kg (240 lb), SpO2 97 %.Body mass index is 29.21 kg/m.  General Appearance: Casual  Eye Contact:  Good  Speech:  Clear and Coherent  Volume:  Normal  Mood:  "Good; much better"  Affect:  Appropriate  Thought Process:  Goal Directed  Orientation:  Full (Time, Place, and Person)  Thought Content:  Logical  Suicidal Thoughts:  No  Homicidal Thoughts:  No  Memory:  Immediate;   Good Recent;   Good Remote;   Fair  Judgement:  Fair  Insight:  Fair  Psychomotor Activity:  Normal  Concentration:  Concentration: Fair and Attention Span: Fair  Recall:  Good  Fund of Knowledge:  Good  Language:  Good  Akathisia:  No  Handed:  Right  AIMS (if indicated):     Assets:  Desire for Improvement Resilience Social Support  ADL's:  Intact  Cognition:  WNL  Sleep:  better   Treatment Plan Summary: Plan Discharge back to facility.    Disposition: No evidence of imminent risk to self or others at present.   Patient does not meet criteria for psychiatric inpatient admission. [facility] to [facility].  Keep scheduled appointment with [name] for medication management  Earleen Newport, NP [facility] 01/11/2017 1:15 PM   Patient seen face-to-face for this evaluation, case discussed with treatment team and physician extender. Formulated treatment plan. Reviewed the information documented and agree with the treatment plan.  Licia Harl 01/11/2017 5:18 PM

## 2017-01-11 NOTE — ED Notes (Signed)
Patient's mother at bedside visiting.

## 2017-01-11 NOTE — [facility] Suicide Risk Assessment (Cosign Needed)
Suicide Risk Assessment  Discharge Assessment   [facility] Discharge Suicide Risk Assessment   Principal Problem: Schizoaffective disorder, depressive type [facility]) Discharge Diagnoses:  Patient Active Problem List   Diagnosis Date Noted  . Elevated CK [R74.8] 04/23/2014  . Seizure disorder ([place]) [id number] 04/23/2014  . CKD (chronic kidney disease), stage III [N18.3] 04/23/2014  . 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 Alcohol abuse [F10.10] 04/21/2014  . Low back pain [M54.5] 03/18/2014  . Encephalopathy [G93.40] 01/17/2014  . Anemia [D64.9] 01/14/2014  . Status epilepticus ([place]) [G40.901] 01/02/2014  . Altered mental status [R41.82] 01/02/2014  . Metabolic acidosis [id number] 01/02/2014  . Acute respiratory failure ([place]) [J96.00] 01/02/2014  . Schizoaffective disorder, depressive type ([place]) [F25.1] 10/31/2011  . Acute renal failure ([place]) [N17.9] 10/31/2011  . Dyslipidemia [E78.5] 10/31/2011    Total Time spent with patient: 30 minutes  Musculoskeletal: Strength & Muscle Tone: within normal limits Gait & Station: normal Patient leans: N/A  Psychiatric Specialty Exam: Physical Exam  Nursing note and vitals reviewed. Constitutional: He is oriented to person, place, and time.  Neck: Normal range of motion.  Respiratory: Effort normal.  Musculoskeletal: Normal range of motion.  Neurological: He is alert and oriented to person, place, and time.    Review of Systems  Psychiatric/Behavioral: Negative for depression, hallucinations (denies at this time), substance abuse and suicidal ideas. The patient is not nervous/anxious and does not have insomnia.   All other systems reviewed and are negative.   Blood pressure 121/85, pulse 98, temperature 97.4 F (36.3 C), temperature source Oral, resp. rate 18, height 6\' 4"  (1.93 m), weight 108.9 kg (240 lb), SpO2 97 %.Body mass index is 29.21 kg/m.  General Appearance: Casual  Eye Contact:  Good   Speech:  Clear and Coherent  Volume:  Normal  Mood:  "Good; much better"  Affect:  Appropriate  Thought Process:  Goal Directed  Orientation:  Full (Time, Place, and Person)  Thought Content:  Logical  Suicidal Thoughts:  No  Homicidal Thoughts:  No  Memory:  Immediate;   Good Recent;   Good Remote;   Fair  Judgement:  Fair  Insight:  Fair  Psychomotor Activity:  Normal  Concentration:  Concentration: Fair and Attention Span: Fair  Recall:  Good  Fund of Knowledge:  Good  Language:  Good  Akathisia:  No  Handed:  Right  AIMS (if indicated):     Assets:  Desire for Improvement Resilience Social Support  ADL's:  Intact  Cognition:  WNL  Sleep:  better      Mental Status Per Nursing Assessment::   On Admission:     Demographic Factors:  Caucasian  Loss Factors: None  Historical Factors: Impulsivity and Living in Nursing Facility  Risk Reduction Factors:   Positive social support and Positive therapeutic relationship  Continued Clinical Symptoms:  Previous Psychiatric Diagnoses and Treatments  Cognitive Features That Contribute To Risk:  Loss of executive function    Suicide Risk:  Minimal: No identifiable suicidal ideation.  Patients presenting with no risk factors but with morbid ruminations; may be classified as minimal risk based on the severity of the depressive symptoms    Plan Of Care/Follow-up recommendations:  Activity:  As tolerated Diet:  As tolerated Other:  Follow up with Dr. Mahalia Longest, Delphia Grates, NP 01/11/2017, 2:11 PM

## 2017-09-10 ENCOUNTER — Encounter: Payer: Self-pay | Admitting: Diagnostic Neuroimaging

## 2017-09-10 ENCOUNTER — Ambulatory Visit (INDEPENDENT_AMBULATORY_CARE_PROVIDER_SITE_OTHER): Payer: Medicare Other | Admitting: Diagnostic Neuroimaging

## 2017-09-10 VITALS — BP 110/83 | HR 111 | Wt 240.0 lb

## 2017-09-10 DIAGNOSIS — R569 Unspecified convulsions: Secondary | ICD-10-CM | POA: Diagnosis not present

## 2017-09-10 MED ORDER — LEVETIRACETAM ER 500 MG PO TB24: 500.0000 mg | ORAL_TABLET | Freq: Two times a day (BID) | ORAL | 4 refills | Status: DC

## 2017-09-10 NOTE — Progress Notes (Signed)
[facility]  PATIENT: Brady Hoffman DOB: 17-Jan-1966  REFERRING CLINICIAN:  HISTORY FROM: patient and mother REASON FOR VISIT: follow up   HISTORICAL  CHIEF COMPLAINT:  Chief Complaint  Patient presents with  . Seizures    rm 7, mother- Brady Hoffman, living at [facility], "no seizure activity"  . Follow-up    one year    HISTORY OF PRESENT ILLNESS:   UPDATE (09/10/17, VRP): Since last visit, doing well. Tolerating LEV. No alleviating or aggravating factors. Had a fall 6 months ago (tripped). Also with in hospital in feb 2018 for hallucinations and agitation. Now stable.   UPDATE 09/04/15 (VRP): Since last visit, doing well. No sz. Pt came here with aunt (who is in the car).   UPDATE 08/31/15 (MM): He returns today for follow-up. She is currently taking Keppra 500 mg twice a day. Patient states that he has not had any additional seizure events. His mother is with him today. Patient is able to complete all ADLs independently. He does not operate a motor vehicle. Denies any new neurological symptoms. He returns today for an evaluation.  UPDATE 08/31/14 (LL): Patient returns for seizure follow up, accompanied by group home worker.  No further seizures, no problems with medication.  No complaints.  PRIOR HPI (03/01/14): 51 year old male with schizoaffective disorder, anxiety, here for evaluation of seizure. 01/02/14 patient was at group home, had not been feeling well for a few weeks. He stood up, lost his balance fell and struck his head. Apparently he hit his head lost consciousness and had a seizure. The exact sequence of events is not clear. Patient was taken to the hospital, intubated, treated for seizure disorder and also found to be in acute renal failure. Patient gradually recovered. He was started on levetiracetam 500 mg twice a day. His renal function gradually improve. Patient went to inpatient rehabilitation at L2 date back to his group home. Since that time no  further seizures. Patient did have a "febrile seizure" at age 44 months old. Apparently he had a "drop attack", in the setting of high fever. He was treated with antiseizure medication at that time. Patient's family history is unknown as he is adopted. Patient never had meningitis or encephalitis. Patient is feeling back to himself. No further seizures since starting antiseizure medication. Patient's mother is here with him and is asking about other movements consisting of rocking, restless, panic attack type movements.   REVIEW OF SYSTEMS: Full 14 system review of systems performed and negative with exception of: fatigue daytime sleepiness.  ALLERGIES: Allergies  Allergen Reactions  . Risperidone And Related Other (See Comments)    Pt. States head feels like a rock    HOME MEDICATIONS: Outpatient Medications Prior to Visit  Medication Sig Dispense Refill  . acetaminophen (TYLENOL) 500 MG tablet Take 500-1,000 mg by mouth every 4 (four) hours as needed (pain).    Marland Kitchen aspirin 81 MG chewable tablet Chew 1 tablet (81 mg total) by mouth daily.    Marland Kitchen atorvastatin (LIPITOR) 20 MG tablet Take 1 tablet (20 mg total) by mouth at bedtime. 30 tablet 1  . benztropine (COGENTIN) 2 MG tablet Take 1 tablet (2 mg total) by mouth at bedtime. 30 tablet 0  . clonazePAM (KLONOPIN) 0.5 MG tablet Take 1 tablet (0.5 mg total) by mouth at bedtime. 30 tablet 0  . cloZAPine (CLOZARIL) 100 MG tablet Take 1 tablet (100 mg total) by mouth 2 (two) times daily. 60 tablet 0  . clozapine (CLOZARIL) 200  MG tablet Take 1 tablet (200 mg total) by mouth at bedtime. 30 tablet 0  . docusate sodium (COLACE) 100 MG capsule Take 100 mg by mouth 2 (two) times daily.    Marland Kitchen FLUoxetine (PROZAC) 20 MG capsule Take 1 capsule (20 mg total) by mouth daily. 30 capsule 0  . folic acid (FOLVITE) 1 MG tablet Take 1 tablet (1 mg total) by mouth daily. 30 tablet 1  . levETIRAcetam (KEPPRA XR) 500 MG 24 hr tablet Take 1 tablet (500 mg total) by mouth  2 (two) times daily. 60 tablet 12  . levothyroxine (SYNTHROID, LEVOTHROID) 50 MCG tablet Take 1 tablet (50 mcg total) by mouth daily before breakfast. For underactive thyroid 30 tablet 0  . omeprazole (PRILOSEC) 40 MG capsule Take 1 capsule (40 mg total) by mouth daily. 30 capsule 1  . polyethylene glycol (MIRALAX / GLYCOLAX) packet Take 17 g by mouth daily.     No facility-administered medications prior to visit.     PAST MEDICAL HISTORY: Past Medical History:  Diagnosis Date  . Anemia 01/14/2014  . Anxiety attack   . Chronic kidney disease   . Depression   . H/O: GI bleed   . Hyperlipidemia   . 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 Schizoaffective disorder   . Seizures ([place])     PAST SURGICAL HISTORY: Past Surgical History:  Procedure Laterality Date  . ESOPHAGOGASTRODUODENOSCOPY  11/01/2011   Procedure: ESOPHAGOGASTRODUODENOSCOPY (EGD);  Surgeon: Beryle Beams;  Location: [facility] ENDOSCOPY;  Service: Endoscopy;  Laterality: N/A;  . MOLE REMOVAL    . WISDOM TOOTH EXTRACTION      FAMILY HISTORY: Family History  Problem Relation Age of Onset  . Adopted: Yes    SOCIAL HISTORY:  Social History   Social History  . Marital status: Single    Spouse name: N/A  . Number of children: 0  . Years of education: College   Occupational History  .  Other   Social History Main Topics  . Smoking status: Never Smoker  . Smokeless tobacco: Never Used  . Alcohol use No  . Drug use: No  . Sexual activity: No   Other Topics Concern  . Not on file   Social History Narrative   Patient lives in [place].   Right handed.   Education  Two years of college.Caffeine Use: 2 cups daily  Coke cola.      PHYSICAL EXAM  GENERAL EXAM/CONSTITUTIONAL: Vitals:  Vitals:   09/10/17 0922  BP: 110/83  Pulse: (!) 111  Weight: 240 lb (108.9 kg)   Body mass index is 29.21 kg/m. No exam data present  Patient is in no distress; well developed,  nourished and groomed; neck is supple  CARDIOVASCULAR:  Examination of carotid arteries is normal; no carotid bruits  TACHYCARDIA, REGULAR rhythm, no murmurs  Examination of peripheral vascular system by observation and palpation is normal  EYES:  Ophthalmoscopic exam of optic discs and posterior segments is normal; no papilledema or hemorrhages  MUSCULOSKELETAL:  Gait, strength, tone, movements noted in Neurologic exam below  NEUROLOGIC: MENTAL STATUS:  No flowsheet data found.  awake, alert, oriented to person, place and time  [facility] EYE CONTACT  recent and remote memory intact  normal attention and concentration  SLOW SPEECH; language fluent, comprehension intact, naming intact,   fund of knowledge appropriate  CRANIAL NERVE:   2nd - no papilledema on fundoscopic exam  2nd, 3rd, 4th, 6th - pupils  equal and reactive to light, visual fields full to confrontation, extraocular muscles intact, no nystagmus  5th - facial sensation symmetric  7th - facial strength symmetric  8th - hearing intact  9th - palate elevates symmetrically, uvula midline  11th - shoulder shrug symmetric  12th - tongue protrusion midline  MOTOR:   normal bulk and tone, full strength in the BUE, BLE  SENSORY:   normal and symmetric to light touch, temperature, vibration  COORDINATION:   finger-nose-finger, fine finger movements normal  REFLEXES:   deep tendon reflexes present and symmetric  GAIT/STATION:   narrow based gait    DIAGNOSTIC DATA (LABS, IMAGING, TESTING) - I reviewed patient records, labs, notes, testing and imaging myself where available.  Lab Results  Component Value Date   WBC 7.6 01/08/2017   HGB 13.4 01/08/2017   HCT 37.9 (L) 01/08/2017   MCV 85.4 01/08/2017   PLT 164 01/08/2017      Component Value Date/Time   NA 141 01/10/2017 0550   K 3.5 01/10/2017 0550   CL 108 01/10/2017 0550   CO2 25 01/10/2017 0550   GLUCOSE 126 (H) 01/10/2017 0550     BUN 12 01/10/2017 0550   CREATININE 1.33 (H) 01/10/2017 0550   CREATININE 1.36 (H) 02/25/2014 1109   CALCIUM 8.6 (L) 01/10/2017 0550   PROT 7.3 01/08/2017 1844   ALBUMIN 4.6 01/08/2017 1844   AST 52 (H) 01/08/2017 1844   ALT 25 01/08/2017 1844   ALKPHOS 110 01/08/2017 1844   BILITOT 0.8 01/08/2017 1844   GFRNONAA >60 01/10/2017 0550   GFRAA >60 01/10/2017 0550   Lab Results  Component Value Date   CHOL 139 10/20/2009   HDL 37 (L) 10/20/2009   LDLCALC 74 10/20/2009   TRIG 176 (H) 01/06/2014   CHOLHDL 3.8 Ratio 10/20/2009   Lab Results  Component Value Date   HGBA1C 5.6 02/25/2014   No results found for: VITAMINB12 Lab Results  Component Value Date   TSH 3.822 01/08/2017    01/03/14 EEG  - normal asleep  01/07/14 MRI brain  - No acute or focal intracranial finding. Brain atrophy premature for age.     ASSESSMENT AND PLAN  51 y.o. year old male here with schizoaffective disorder, h/o febrile seizure at age 76 months, with new onset possible seizure (2015) in setting of acute renal failure. Now doing well with gradually improving renal function. Tolerating LEV XR 500mg  BID and no further seizures.  Dx:  Seizures ([place])    PLAN:  I spent 15 minutes of face to face time with patient. Greater than 50% of time was spent in counseling and coordination of care with patient. In summary we discussed:    - continue LEV XR 500mg  twice a day  - may follow up with PCP for future refills (if they agree)  Meds ordered this encounter  Medications  . levETIRAcetam (KEPPRA XR) 500 MG 24 hr tablet    Sig: Take 1 tablet (500 mg total) by mouth 2 (two) times daily.    Dispense:  180 tablet    Refill:  4   Return if symptoms worsen or fail to improve, for return to PCP.    Penni Bombard, MD 59/56/3875, 6:43 AM Certified in Neurology, Neurophysiology and Neuroimaging  [facility] [address] [place], [place] [address] [phone]

## 2017-09-10 NOTE — Patient Instructions (Signed)
-   continue levetiracetam XR 500mg  twice a day   - may follow up with PCP for future refills

## 2019-01-16 ENCOUNTER — Other Ambulatory Visit: Payer: Self-pay

## 2019-01-16 ENCOUNTER — Emergency Department (HOSPITAL_COMMUNITY): Payer: Medicare Other

## 2019-01-16 ENCOUNTER — Emergency Department (HOSPITAL_COMMUNITY)
Admission: EM | Admit: 2019-01-16 | Discharge: 2019-01-17 | Disposition: A | Discharge status: Home / Self Care | Payer: Medicare Other | Attending: Emergency Medicine | Admitting: Emergency Medicine

## 2019-01-16 ENCOUNTER — Encounter (HOSPITAL_COMMUNITY): Payer: Self-pay | Admitting: *Deleted

## 2019-01-16 DIAGNOSIS — K5900 Constipation, unspecified: Secondary | ICD-10-CM | POA: Diagnosis not present

## 2019-01-16 DIAGNOSIS — Z79899 Other long term (current) drug therapy: Secondary | ICD-10-CM | POA: Diagnosis not present

## 2019-01-16 DIAGNOSIS — F329 Major depressive disorder, single episode, unspecified: Secondary | ICD-10-CM | POA: Insufficient documentation

## 2019-01-16 DIAGNOSIS — R1084 Generalized abdominal pain: Secondary | ICD-10-CM

## 2019-01-16 DIAGNOSIS — F259 Schizoaffective disorder, unspecified: Secondary | ICD-10-CM | POA: Diagnosis not present

## 2019-01-16 DIAGNOSIS — N183 Chronic kidney disease, stage 3 (moderate): Secondary | ICD-10-CM | POA: Insufficient documentation

## 2019-01-16 DIAGNOSIS — F1010 Alcohol abuse, uncomplicated: Secondary | ICD-10-CM | POA: Diagnosis not present

## 2019-01-16 LAB — COMPREHENSIVE METABOLIC PANEL
ALT: 41 U/L (ref 0–44)
AST: 104 U/L — ABNORMAL HIGH (ref 15–41)
Albumin: 4.3 g/dL (ref 3.5–5.0)
Alkaline Phosphatase: 100 U/L (ref 38–126)
Anion gap: 11 (ref 5–15)
BUN: 25 mg/dL — ABNORMAL HIGH (ref 6–20)
CO2: 21 mmol/L — ABNORMAL LOW (ref 22–32)
Calcium: 8.5 mg/dL — ABNORMAL LOW (ref 8.9–10.3)
Chloride: 110 mmol/L (ref 98–111)
Creatinine, Ser: 1.56 mg/dL — ABNORMAL HIGH (ref 0.61–1.24)
GFR calc Af Amer: 58 mL/min — ABNORMAL LOW (ref >60)
GFR calc non Af Amer: 50 mL/min — ABNORMAL LOW (ref >60)
Glucose, Bld: 159 mg/dL — ABNORMAL HIGH (ref 70–99)
Potassium: 4.2 mmol/L (ref 3.5–5.1)
Sodium: 142 mmol/L (ref 135–145)
Total Bilirubin: 0.7 mg/dL (ref 0.3–1.2)
Total Protein: 7 g/dL (ref 6.5–8.1)

## 2019-01-16 LAB — URINALYSIS, ROUTINE W REFLEX MICROSCOPIC
Bilirubin Urine: NEGATIVE
Glucose, UA: NEGATIVE mg/dL
Ketones, ur: 20 mg/dL — AB
Leukocytes,Ua: NEGATIVE
Nitrite: NEGATIVE
Protein, ur: 100 mg/dL — AB
Specific Gravity, Urine: 1.029 (ref 1.005–1.030)
pH: 5 (ref 5.0–8.0)

## 2019-01-16 LAB — CBC
HCT: 36.9 % — ABNORMAL LOW (ref 39.0–52.0)
Hemoglobin: 12.3 g/dL — ABNORMAL LOW (ref 13.0–17.0)
MCH: 30.3 pg (ref 26.0–34.0)
MCHC: 33.3 g/dL (ref 30.0–36.0)
MCV: 90.9 fL (ref 80.0–100.0)
Platelets: 148 10*3/uL — ABNORMAL LOW (ref 150–400)
RBC: 4.06 MIL/uL — ABNORMAL LOW (ref 4.22–5.81)
RDW: 12.6 % (ref 11.5–15.5)
WBC: 12.5 10*3/uL — ABNORMAL HIGH (ref 4.0–10.5)
nRBC: 0 % (ref 0.0–0.2)

## 2019-01-16 LAB — LIPASE, BLOOD: Lipase: 25 U/L (ref 11–51)

## 2019-01-16 MED ORDER — POLYETHYLENE GLYCOL 3350 17 G PO PACK: 17.0000 g | PACK | Freq: Every day | ORAL | 0 refills | Status: DC

## 2019-01-16 MED ORDER — SODIUM CHLORIDE (PF) 0.9 % IJ SOLN
INTRAMUSCULAR | Status: AC
  Filled 2019-01-16: qty 50

## 2019-01-16 MED ORDER — SODIUM CHLORIDE 0.9 % IV BOLUS
1000.0000 mL | Freq: Once | INTRAVENOUS | Status: AC
  Administered 2019-01-16: 1000 mL via INTRAVENOUS

## 2019-01-16 MED ORDER — IOPAMIDOL (ISOVUE-300) INJECTION 61%
INTRAVENOUS | Status: AC
  Filled 2019-01-16: qty 100

## 2019-01-16 MED ORDER — IOPAMIDOL (ISOVUE-300) INJECTION 61%
100.0000 mL | Freq: Once | INTRAVENOUS | Status: AC | PRN
  Administered 2019-01-16: 100 mL via INTRAVENOUS

## 2019-01-16 MED ORDER — MAGNESIUM CITRATE PO SOLN
1.0000 | Freq: Once | ORAL | Status: AC
  Administered 2019-01-17: 1 via ORAL
  Filled 2019-01-16: qty 296

## 2019-01-16 MED ORDER — SODIUM CHLORIDE 0.9% FLUSH: 3.0000 mL | Freq: Once | INTRAVENOUS | Status: DC

## 2019-01-16 NOTE — Discharge Instructions (Signed)
CT scan shows constipation but no other acute problems or blockages.  Tomorrow please follow directions on packaging and take magnesium citrate that should help you to have a bowel movement and clear out the large amount of stool noted in your colon.  After that please begin using 1 packet of MiraLAX and a clear liquid once daily to help ensure that your bowels continue to move regularly.  If you are still unable to have a bowel movement follow-up with your primary care doctor or return to the emergency department, return if you develop fevers, blood in your stools, worsening abdominal pain, vomiting or any other new or concerning symptoms.

## 2019-01-16 NOTE — ED Notes (Signed)
Pt in CT.

## 2019-01-16 NOTE — ED Notes (Signed)
[facility]  [phone]  ---- Mother Jana Half located in [place]  [phone]

## 2019-01-16 NOTE — ED Provider Notes (Signed)
[facility] DEPT Provider Note   CSN: [id number] Arrival date & time: 01/16/19  1853     History   Chief Complaint Chief Complaint  Patient presents with  . Constipation    HPI Brady Hoffman is a 53 y.o. male.  Brady Hoffman is a 53 y.o. male with a history of schizoaffective disorder, seizures, chronic kidney disease, hyperlipidemia and depression, who presents to the emergency department via EMS from group home for evaluation of constipation.  EMS reported that patient was here for evaluation of diarrhea, and facility reported that patient was going to the bathroom frequently, but patient reports to me that he has been going to the bathroom frequently to try but has been unable to pass a bowel movement for the past 4 days.  He reports some associated lower abdominal discomfort.  Prior to this was having relatively normal stools.  He denies any melena or hematochezia.  He has not had any abdominal distention.  Denies nausea or vomiting.  No fevers or chills.  No vomiting or pain with urination.  No pain in his chest or shortness of breath.  He has not taken anything to treat the symptoms prior to arrival.  Chart review shows that patient was previously on MiraLAX but is no longer taking this.  He denies history of abdominal surgeries.  Denies any other aggravating or alleviating factors     Past Medical History:  Diagnosis Date  . Anemia 01/14/2014  . Anxiety attack   . Chronic kidney disease   . Depression   . H/O: GI bleed   . Hyperlipidemia   . 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 Schizoaffective disorder   . Seizures [facility])     Patient Active Problem List   Diagnosis Date Noted  . Hallucinations   . Elevated CK 04/23/2014  . Seizure disorder ([place]) 04/23/2014  . CKD (chronic kidney disease), stage III ([place]) 04/23/2014  . 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 Alcohol abuse  04/21/2014  . Low back pain 03/18/2014  . Encephalopathy 01/17/2014  . Anemia 01/14/2014  . Status epilepticus ([place]) 01/02/2014  . Altered mental status 01/02/2014  . Metabolic acidosis 07/15/4817  . Acute respiratory failure ([place]) 01/02/2014  . Schizoaffective disorder, depressive type ([place]) 10/31/2011  . Acute renal failure ([place]) 10/31/2011  . Dyslipidemia 10/31/2011    Past Surgical History:  Procedure Laterality Date  . ESOPHAGOGASTRODUODENOSCOPY  11/01/2011   Procedure: ESOPHAGOGASTRODUODENOSCOPY (EGD);  Surgeon: Beryle Beams;  Location: [facility] ENDOSCOPY;  Service: Endoscopy;  Laterality: N/A;  . MOLE REMOVAL    . WISDOM TOOTH EXTRACTION          Home Medications    Prior to Admission medications   Medication Sig Start Date End Date Taking? Authorizing Provider  acetaminophen (TYLENOL) 500 MG tablet Take 500-1,000 mg by mouth every 4 (four) hours as needed (pain).    [provider]  aspirin 81 MG chewable tablet Chew 1 tablet (81 mg total) by mouth daily. Patient not taking: Reported on 09/10/2017 05/04/14   Niel Hummer, NP  atorvastatin (LIPITOR) 20 MG tablet Take 1 tablet (20 mg total) by mouth at bedtime. 05/04/14   Niel Hummer, NP  benztropine (COGENTIN) 2 MG tablet Take 1 tablet (2 mg total) by mouth at bedtime. 05/04/14   Niel Hummer, NP  clonazePAM (KLONOPIN) 0.5 MG tablet Take 1 tablet (0.5 mg total) by mouth at bedtime. 05/04/14  Niel Hummer, NP  cloZAPine (CLOZARIL) 100 MG tablet Take 1 tablet (100 mg total) by mouth 2 (two) times daily. 01/11/17   Rankin, Shuvon B, NP  clozapine (CLOZARIL) 200 MG tablet Take 1 tablet (200 mg total) by mouth at bedtime. 05/04/14   Niel Hummer, NP  docusate sodium (COLACE) 100 MG capsule Take 100 mg by mouth 2 (two) times daily.    [provider]  FLUoxetine (PROZAC) 20 MG capsule Take 1 capsule (20 mg total) by mouth daily. 05/04/14   Niel Hummer, NP  folic acid (FOLVITE) 1 MG tablet Take 1 tablet (1 mg  total) by mouth daily. 05/04/14   Niel Hummer, NP  levETIRAcetam (KEPPRA XR) 500 MG 24 hr tablet Take 1 tablet (500 mg total) by mouth 2 (two) times daily. 09/10/17   Penumalli, Earlean Polka, MD  levothyroxine (SYNTHROID, LEVOTHROID) 50 MCG tablet Take 1 tablet (50 mcg total) by mouth daily before breakfast. For underactive thyroid 05/04/14   Niel Hummer, NP  omeprazole (PRILOSEC) 40 MG capsule Take 1 capsule (40 mg total) by mouth daily. 05/04/14   Niel Hummer, NP  polyethylene glycol (MIRALAX / GLYCOLAX) packet Take 17 g by mouth daily. 01/16/19   Jacqlyn Larsen, PA-C    Family History Family History  Adopted: Yes    Social History Social History   Tobacco Use  . Smoking status: Never Smoker  . Smokeless tobacco: Never Used  Substance Use Topics  . Alcohol use: No  . Drug use: No     Allergies   Risperidone and related   Review of Systems Review of Systems  Constitutional: Negative for chills and fever.  HENT: Negative.   Eyes: Negative for visual disturbance.  Respiratory: Negative for cough and shortness of breath.   Cardiovascular: Negative for chest pain.  Gastrointestinal: Positive for abdominal pain and constipation. Negative for abdominal distention, blood in stool, diarrhea, nausea and vomiting.  Genitourinary: Negative for dysuria, flank pain and frequency.  Musculoskeletal: Negative for arthralgias and myalgias.  Skin: Negative for color change and rash.  Neurological: Negative for dizziness, syncope and light-headedness.     Physical Exam Updated Vital Signs BP (!) 142/89 (BP Location: Right Arm)   Pulse (!) 120   Temp 98.9 F (37.2 C) (Oral)   Resp 16   Ht 6' (1.829 m)   Wt 90.7 kg   SpO2 95%   BMI 27.12 kg/m   Physical Exam Vitals signs and nursing note reviewed.  Constitutional:      General: He is not in acute distress.    Appearance: Normal appearance. He is well-developed. He is not ill-appearing or diaphoretic.  HENT:     Head:  Normocephalic and atraumatic.     Mouth/Throat:     Mouth: Mucous membranes are moist.     Pharynx: Oropharynx is clear.  Eyes:     General:        Right eye: No discharge.        Left eye: No discharge.     Pupils: Pupils are equal, round, and reactive to light.  Neck:     Musculoskeletal: Neck supple.  Cardiovascular:     Rate and Rhythm: Regular rhythm. Tachycardia present.     Pulses: Normal pulses.     Heart sounds: Normal heart sounds. No murmur. No friction rub. No gallop.   Pulmonary:     Effort: Pulmonary effort is normal. No respiratory distress.     Breath sounds:  Normal breath sounds. No wheezing or rales.     Comments: Respirations equal and unlabored, patient able to speak in full sentences, lungs clear to auscultation bilaterally Abdominal:     General: Bowel sounds are normal. There is no distension.     Palpations: Abdomen is soft. There is no mass.     Tenderness: There is abdominal tenderness. There is no guarding.     Comments: Abdomen is soft and nondistended, bowel sounds present throughout, patient is endorsing tenderness with palpation throughout the lower abdomen, but there is no guarding, rebound tenderness or rigidity.  Musculoskeletal:        General: No deformity.  Skin:    General: Skin is warm and dry.     Capillary Refill: Capillary refill takes less than 2 seconds.  Neurological:     Mental Status: He is alert.     Coordination: Coordination normal.     Comments: Speech is clear, able to follow commands Moves extremities without ataxia, coordination intact  Psychiatric:        Attention and Perception: Attention normal.        Mood and Affect: Mood is anxious. Affect is flat.        Speech: Speech normal.        Behavior: Behavior normal. Behavior is cooperative.      ED Treatments / Results  Labs (all labs ordered are listed, but only abnormal results are displayed) Labs Reviewed  COMPREHENSIVE METABOLIC PANEL - Abnormal; Notable for  the following components:      Result Value   CO2 21 (*)    Glucose, Bld 159 (*)    BUN 25 (*)    Creatinine, Ser 1.56 (*)    Calcium 8.5 (*)    AST 104 (*)    GFR calc non Af Amer 50 (*)    GFR calc Af Amer 58 (*)    All other components within normal limits  CBC - Abnormal; Notable for the following components:   WBC 12.5 (*)    RBC 4.06 (*)    Hemoglobin 12.3 (*)    HCT 36.9 (*)    Platelets 148 (*)    All other components within normal limits  URINALYSIS, ROUTINE W REFLEX MICROSCOPIC - Abnormal; Notable for the following components:   Hgb urine dipstick LARGE (*)    Ketones, ur 20 (*)    Protein, ur 100 (*)    Bacteria, UA RARE (*)    All other components within normal limits  LIPASE, BLOOD    EKG None  Radiology Ct Abdomen Pelvis W Contrast  Result Date: 01/16/2019 CLINICAL DATA:  Abdomen pain.  Assess for diverticulitis. EXAM: CT ABDOMEN AND PELVIS WITH CONTRAST TECHNIQUE: Multidetector CT imaging of the abdomen and pelvis was performed using the standard protocol following bolus administration of intravenous contrast. CONTRAST:  140mL ISOVUE-300 IOPAMIDOL (ISOVUE-300) INJECTION 61% COMPARISON:  January 02, 2014 FINDINGS: Lower chest: There is mild atelectasis of the bilateral posterior lung bases. The heart size is upper limits are normal. Hepatobiliary: Diffuse low density of the liver is identified. 2 mm low-density lesion is identified in the right lobe liver. No other focal liver lesion is identified. Gallstones are noted in the gallbladder. No inflammation is noted around gallbladder. The biliary tree is normal. Pancreas: Unremarkable. Spleen: Unremarkable. Adrenals/Urinary Tract: The bilateral adrenal glands are normal. Tiny cysts are noted in both kidneys. There is no hydronephrosis bilaterally. The bladder is normal. Stomach/Bowel: Stomach is within normal limits. Appendix appears  normal. No evidence of bowel wall thickening, distention, or inflammatory changes.  Extensive bowel content is identified throughout colon. Vascular/Lymphatic: Aortic atherosclerosis. No enlarged abdominal or pelvic lymph nodes. Reproductive: Prostate calcifications are noted. Other: None. Musculoskeletal: Degenerative joint changes of the spine are noted. Chronic compression deformity of T12 is noted. IMPRESSION: No evidence of bowel obstruction or diverticulitis. Extensive bowel content is identified throughout colon which can be seen in constipation. Fatty infiltration of liver. Cholelithiasis. Electronically Signed   By: Abelardo Diesel M.D.   On: 01/16/2019 21:46    Procedures Procedures (including critical care time)  Medications Ordered in ED Medications  sodium chloride flush (NS) 0.9 % injection 3 mL (has no administration in time range)  iopamidol (ISOVUE-300) 61 % injection (has no administration in time range)  sodium chloride (PF) 0.9 % injection (has no administration in time range)  magnesium citrate solution 1 Bottle (has no administration in time range)  sodium chloride 0.9 % bolus 1,000 mL (0 mLs Intravenous Stopped 01/16/19 2330)  iopamidol (ISOVUE-300) 61 % injection 100 mL (100 mLs Intravenous Contrast Given 01/16/19 2130)     Initial Impression / Assessment and Plan / ED Course  I have reviewed the triage vital signs and the nursing notes.  Pertinent labs & imaging results that were available during my care of the patient were reviewed by me and considered in my medical decision making (see chart for details).  53 year old male presents from group home for evaluation of constipation.  Patient had been sent from facility with concern for diarrhea as patient had been getting up and going to the bathroom frequently he reports he that he has not been able to pass stool well and has some lower abdominal pain associated with this.  No fevers or chills, no nausea or vomiting.  Patient does not have any abdominal distention on exam but does have some tenderness  without guarding across the lower abdomen.  No other focal symptoms.  On arrival patient is tachycardic to 120, does appear anxious, on chart review patient is tachycardic during all previous emergency department encounters and I suspect this is his baseline, although he may be a bit dehydrated will give IV fluids.  Given patient's constipation and abdominal tenderness, will get CT abdomen pelvis to rule out diverticulitis as cause for patient's constipation or obstruction.  Will also get basic abdominal labs.  Labs show mild leukocytosis of 12.5 with stable hemoglobin, creatinine slightly increased from previous, 1.56 today was previously 1.38.  Patient has mild hyperglycemia of 159, no other acute electrolyte derangements requiring intervention, normal liver function and normal lipase.  Urinalysis without signs of infection.  CT shows no evidence of obstruction or diverticulitis, there is increased stool content throughout the colon suggestive of constipation but no signs of colitis, uncomplicated cholelithiasis noted as well as fatty infiltration of the liver, no other significant findings.  I suspect constipation as cause of patient's pain.  Will have patient begin on his MiraLAX again, and will also give patient bottle of mag citrate to complete at home to acutely relieve constipation.  Heart rate has improved with IV fluids, on my exam prior to discharge patient calm, abdomen without tenderness, heart rate 104.  At this time he is stable for discharge back to his facility, I have provided instructions for appropriate bowel regimen.  PCP follow-up encouraged.  Return precautions discussed.  Patient expresses understanding and agreement with plan.  Final Clinical Impressions(s) / ED Diagnoses   Final diagnoses:  Constipation,  unspecified constipation type  Generalized abdominal pain    ED Discharge Orders         Ordered    polyethylene glycol (MIRALAX / GLYCOLAX) packet  Daily     01/16/19 2309             Jacqlyn Larsen, [place] 01/20/19 1656    Virgel Manifold, MD 01/20/19 2052

## 2019-01-16 NOTE — ED Triage Notes (Addendum)
EMS states pt is from [facility]) has had diarrhea for 4 days, ? Seen a few weeks ago for similar. 180/120-140-- IV 20 L Hand 700cc NS and 4 mg Zofran, 138/80-116 CBG [address] [place] is address of Group Home

## 2019-01-16 NOTE — ED Notes (Addendum)
Pt ambulated to BR and back with no assistance. Tolerated well

## 2019-01-17 DIAGNOSIS — K5900 Constipation, unspecified: Secondary | ICD-10-CM | POA: Diagnosis not present

## 2019-02-04 ENCOUNTER — Encounter (HOSPITAL_COMMUNITY): Payer: Self-pay | Admitting: Emergency Medicine

## 2019-02-04 ENCOUNTER — Emergency Department (HOSPITAL_COMMUNITY)
Admission: EM | Admit: 2019-02-04 | Discharge: 2019-02-04 | Payer: Medicare Other | Attending: Emergency Medicine | Admitting: Emergency Medicine

## 2019-02-04 DIAGNOSIS — R39198 Other difficulties with micturition: Secondary | ICD-10-CM | POA: Diagnosis present

## 2019-02-04 DIAGNOSIS — Z79899 Other long term (current) drug therapy: Secondary | ICD-10-CM | POA: Insufficient documentation

## 2019-02-04 DIAGNOSIS — N183 Chronic kidney disease, stage 3 (moderate): Secondary | ICD-10-CM | POA: Diagnosis not present

## 2019-02-04 HISTORY — DX: Pervasive developmental disorder, unspecified: F84.9

## 2019-02-04 LAB — COMPREHENSIVE METABOLIC PANEL
ALBUMIN: 4.3 g/dL (ref 3.5–5.0)
ALK PHOS: 117 U/L (ref 38–126)
ALT: 25 U/L (ref 0–44)
AST: 31 U/L (ref 15–41)
Anion gap: 15 (ref 5–15)
BUN: 20 mg/dL (ref 6–20)
CO2: 21 mmol/L — AB (ref 22–32)
Calcium: 9.6 mg/dL (ref 8.9–10.3)
Chloride: 107 mmol/L (ref 98–111)
Creatinine, Ser: 1.73 mg/dL — ABNORMAL HIGH (ref 0.61–1.24)
GFR calc Af Amer: 51 mL/min — ABNORMAL LOW (ref >60)
GFR calc non Af Amer: 44 mL/min — ABNORMAL LOW (ref >60)
Glucose, Bld: 110 mg/dL — ABNORMAL HIGH (ref 70–99)
POTASSIUM: 3.8 mmol/L (ref 3.5–5.1)
SODIUM: 143 mmol/L (ref 135–145)
Total Bilirubin: 0.7 mg/dL (ref 0.3–1.2)
Total Protein: 7.4 g/dL (ref 6.5–8.1)

## 2019-02-04 LAB — CBC WITH DIFFERENTIAL/PLATELET
Abs Immature Granulocytes: 0.02 10*3/uL (ref 0.00–0.07)
BASOS ABS: 0 10*3/uL (ref 0.0–0.1)
Basophils Relative: 0 %
Eosinophils Absolute: 0 10*3/uL (ref 0.0–0.5)
Eosinophils Relative: 0 %
HCT: 41.9 % (ref 39.0–52.0)
HEMOGLOBIN: 14.2 g/dL (ref 13.0–17.0)
IMMATURE GRANULOCYTES: 0 %
LYMPHS ABS: 1.3 10*3/uL (ref 0.7–4.0)
Lymphocytes Relative: 19 %
MCH: 30.2 pg (ref 26.0–34.0)
MCHC: 33.9 g/dL (ref 30.0–36.0)
MCV: 89.1 fL (ref 80.0–100.0)
Monocytes Absolute: 0.6 10*3/uL (ref 0.1–1.0)
Monocytes Relative: 9 %
NEUTROS PCT: 72 %
NRBC: 0 % (ref 0.0–0.2)
Neutro Abs: 4.8 10*3/uL (ref 1.7–7.7)
Platelets: 164 10*3/uL (ref 150–400)
RBC: 4.7 MIL/uL (ref 4.22–5.81)
RDW: 12.2 % (ref 11.5–15.5)
WBC: 6.8 10*3/uL (ref 4.0–10.5)

## 2019-02-04 LAB — URINALYSIS, ROUTINE W REFLEX MICROSCOPIC
Bilirubin Urine: NEGATIVE
Glucose, UA: NEGATIVE mg/dL
Hgb urine dipstick: NEGATIVE
Ketones, ur: 20 mg/dL — AB
LEUKOCYTE UA: NEGATIVE
Nitrite: NEGATIVE
PH: 5 (ref 5.0–8.0)
Protein, ur: NEGATIVE mg/dL
SPECIFIC GRAVITY, URINE: 1.028 (ref 1.005–1.030)

## 2019-02-04 LAB — LIPASE, BLOOD: Lipase: 18 U/L (ref 11–51)

## 2019-02-04 MED ORDER — CLONAZEPAM 1 MG PO TBDP
2.0000 mg | ORAL_TABLET | Freq: Two times a day (BID) | ORAL | Status: DC
  Administered 2019-02-04: 2 mg via ORAL
  Filled 2019-02-04: qty 2

## 2019-02-04 MED ORDER — SODIUM CHLORIDE 0.9 % IV BOLUS
1000.0000 mL | Freq: Once | INTRAVENOUS | Status: AC
  Administered 2019-02-04: 1000 mL via INTRAVENOUS

## 2019-02-04 NOTE — ED Notes (Signed)
[facility] called @ 1727-per RN-called by Levada Dy

## 2019-02-04 NOTE — ED Notes (Signed)
Gave pt coke and [place] sandwich

## 2019-02-04 NOTE — ED Notes (Signed)
Gave report to Freda Munro, the Teaching laboratory technician at [facility]. Pt will be returning via [facility]

## 2019-02-04 NOTE — ED Triage Notes (Signed)
Pt arrives via EMS from group home. Pt complains of generalized abd pain. LBM yesterday. Pt diaphoretic per EMS. Denies N/VD. Pt has not wanted to eat much yesterday and today. BP 120/70, HR 118, CBG 122. Pt is at mental baseline

## 2019-02-04 NOTE — ED Provider Notes (Addendum)
[facility] EMERGENCY DEPARTMENT Provider Note   CSN: [id number] Arrival date & time: 02/04/19  1325    History   Chief Complaint Chief Complaint  Patient presents with  . Abdominal Pain    HPI Brady Hoffman is a 53 y.o. male with h/o schizoaffective disorder, anxiety, seizures is brought to ER by EMS from [facility] group home for evaluation of abdominal pain. He points at suprapubic area.  Cramping, 8/10, constant.  Associated with "pain in rectum", difficulty urinating and decreased urine output.  States the last 2 days he has been pushing to void urine, last void was earlier this morning.  Also reports being in ER 2 weeks ago for constipation. He took miralax for a few days and ran out of this medicine.  He is having small, soft, non bloody BMs daily with last one earlier today.  Normal flatus.  States one time he had similar difficulty urinating but can't remember details.  Denies fevers, chills, nausea, vomiting, CP, hematuria, dysuria, testicular pain.  Cramping 8/10.  No interventions. No alleviating/aggravating factors. Per triage note pt has been eating less than normal at group home.       HPI  Past Medical History:  Diagnosis Date  . Anemia 01/14/2014  . Anxiety attack   . Chronic kidney disease   . Depression   . H/O: GI bleed   . Hyperlipidemia   . 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 Pervasive developmental disorder   . Schizoaffective disorder   . Seizures [facility])     Patient Active Problem List   Diagnosis Date Noted  . Hallucinations   . Elevated CK 04/23/2014  . Seizure disorder ([place]) 04/23/2014  . CKD (chronic kidney disease), stage III ([place]) 04/23/2014  . 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 Alcohol abuse 04/21/2014  . Low back pain 03/18/2014  . Encephalopathy 01/17/2014  . Anemia 01/14/2014  . Status epilepticus ([place]) 01/02/2014  . Altered mental status 01/02/2014  .  Metabolic acidosis 14/48/1856  . Acute respiratory failure ([place]) 01/02/2014  . Schizoaffective disorder, depressive type ([place]) 10/31/2011  . Acute renal failure ([place]) 10/31/2011  . Dyslipidemia 10/31/2011    Past Surgical History:  Procedure Laterality Date  . ESOPHAGOGASTRODUODENOSCOPY  11/01/2011   Procedure: ESOPHAGOGASTRODUODENOSCOPY (EGD);  Surgeon: Beryle Beams;  Location: [facility] ENDOS[facility];  Service: Endoscopy;  Laterality: N/A;  . MOLE REMOVAL    . WISDOM TOOTH EXTRACTION          Home Medications    Prior to Admission medications   Medication Sig Start Date End Date Taking? Authorizing Provider  acetaminophen (TYLENOL) 500 MG tablet Take 500-1,000 mg by mouth every 4 (four) hours as needed (pain).    [provider]  aspirin 81 MG chewable tablet Chew 1 tablet (81 mg total) by mouth daily. Patient not taking: Reported on 09/10/2017 05/04/14   Niel Hummer, NP  atorvastatin (LIPITOR) 20 MG tablet Take 1 tablet (20 mg total) by mouth at bedtime. 05/04/14   Niel Hummer, NP  benztropine (COGENTIN) 2 MG tablet Take 1 tablet (2 mg total) by mouth at bedtime. 05/04/14   Niel Hummer, NP  clonazePAM (KLONOPIN) 0.5 MG tablet Take 1 tablet (0.5 mg total) by mouth at bedtime. 05/04/14   Niel Hummer, NP  cloZAPine (CLOZARIL) 100 MG tablet Take 1 tablet (100 mg total) by mouth 2 (two) times daily. 01/11/17   Rankin, Mercy Moore, NP  clozapine (CLOZARIL) 200 MG tablet Take 1 tablet (200 mg total) by mouth at bedtime. 05/04/14   Niel Hummer, NP  docusate sodium (COLACE) 100 MG capsule Take 100 mg by mouth 2 (two) times daily.    [provider]  FLUoxetine (PROZAC) 20 MG capsule Take 1 capsule (20 mg total) by mouth daily. 05/04/14   Niel Hummer, NP  folic acid (FOLVITE) 1 MG tablet Take 1 tablet (1 mg total) by mouth daily. 05/04/14   Niel Hummer, NP  levETIRAcetam (KEPPRA XR) 500 MG 24 hr tablet Take 1 tablet (500 mg total) by mouth 2 (two) times daily. 09/10/17    Penumalli, Earlean Polka, MD  levothyroxine (SYNTHROID, LEVOTHROID) 50 MCG tablet Take 1 tablet (50 mcg total) by mouth daily before breakfast. For underactive thyroid 05/04/14   Niel Hummer, NP  omeprazole (PRILOSEC) 40 MG capsule Take 1 capsule (40 mg total) by mouth daily. 05/04/14   Niel Hummer, NP  polyethylene glycol (MIRALAX / GLYCOLAX) packet Take 17 g by mouth daily. 01/16/19   Jacqlyn Larsen, PA-C    Family History Family History  Adopted: Yes    Social History Social History   Tobacco Use  . Smoking status: Never Smoker  . Smokeless tobacco: Never Used  Substance Use Topics  . Alcohol use: No  . Drug use: No     Allergies   Risperidone and related   Review of Systems Review of Systems  Gastrointestinal: Positive for abdominal pain and rectal pain.  Genitourinary: Positive for difficulty urinating.  All other systems reviewed and are negative.    Physical Exam Updated Vital Signs BP (!) 149/105 (BP Location: Right Arm)   Pulse (!) 101   Temp (!) 97.5 F (36.4 C) (Oral)   Resp 15   Ht 6\' 3"  (1.905 m)   Wt 108.9 kg   SpO2 99%   BMI 30.00 kg/m   Physical Exam Vitals signs and nursing note reviewed.  Constitutional:      Appearance: He is well-developed.     Comments: Odd affect.  Poor eye contact.  HENT:     Head: Normocephalic and atraumatic.     Nose: Nose normal.     Mouth/Throat:     Comments: Dry lips, moist mucous membranes.  Poor dentition. Eyes:     Conjunctiva/sclera: Conjunctivae normal.     Pupils: Pupils are equal, round, and reactive to light.  Neck:     Musculoskeletal: Normal range of motion.  Cardiovascular:     Rate and Rhythm: Normal rate and regular rhythm.     Heart sounds: Normal heart sounds.  Pulmonary:     Effort: Pulmonary effort is normal.     Breath sounds: Normal breath sounds.  Abdominal:     General: Bowel sounds are normal.     Palpations: Abdomen is soft.     Tenderness: There is abdominal tenderness in the  suprapubic area.     Comments: 170 cc in bladder scan prior to I/O.  Mild very focal suprapubic tenderness.  No distention. No G/R/R. No CVA tenderness. Negative Murphy's and McBurney's.  Lower quadrants with normal bowel sounds.  No pulsatility.  Genitourinary:    Penis: Hypospadias present.      Comments:  Exam performed with ER RN/tech at bedside. Hypospadias noted, foreskin needs to be retracted to expose urethra.  No obvious surrounding urethral irritation, drainage, bleeding.  No groin lymphadenopathy. Glans and shaft smooth without tenderness, lesions, masses or deformity.  Scrotum without tenderness, lesions or edema.  No obvious inguinal hernia/bulging.  No perianal skin lesions, fissures or external hemorrhoids.  Good rectal tone.  None palpable versus not enlarged prostate.  No obvious discomfort with DRE. Musculoskeletal: Normal range of motion.  Skin:    General: Skin is warm and dry.     Capillary Refill: Capillary refill takes less than 2 seconds.  Neurological:     Mental Status: He is alert and oriented to person, place, and time.  Psychiatric:        Behavior: Behavior normal.        Thought Content: Thought content normal.        Judgment: Judgment normal.      ED Treatments / Results  Labs (all labs ordered are listed, but only abnormal results are displayed) Labs Reviewed  COMPREHENSIVE METABOLIC PANEL - Abnormal; Notable for the following components:      Result Value   CO2 21 (*)    Glucose, Bld 110 (*)    Creatinine, Ser 1.73 (*)    GFR calc non Af Amer 44 (*)    GFR calc Af Amer 51 (*)    All other components within normal limits  URINALYSIS, ROUTINE W REFLEX MICROSCOPIC - Abnormal; Notable for the following components:   Ketones, ur 20 (*)    All other components within normal limits  URINE CULTURE  CBC WITH DIFFERENTIAL/PLATELET  LIPASE, BLOOD    EKG None  Radiology No results found.  Procedures Procedures (including critical care  time)  Medications Ordered in ED Medications  clonazePAM (KLONOPIN) disintegrating tablet 2 mg (2 mg Oral Given 02/04/19 1836)  sodium chloride 0.9 % bolus 1,000 mL (0 mLs Intravenous Stopped 02/04/19 1548)     Initial Impression / Assessment and Plan / ED Course  I have reviewed the triage vital signs and the nursing notes.  Pertinent labs & imaging results that were available during my care of the patient were reviewed by me and considered in my medical decision making (see chart for details).  Clinical Course as of Feb 03 2022  Thu Mar 05, 632  163 53 year old male here by ambulance from group home for evaluation of abdominal and rectal pain.  He said he has had this before and it is usually constipation.  He said he tried some medication for it but it did not help.  No known fevers but he said he has been sweaty.  His abdominal exam is soft without any masses guarding or rebound.   [MB]  7741 Ketones, ur(!): 20 [CG]  2004 Creatinine(!): 1.73 [CG]  2004 GFR, Est Non African American(!): 44 [CG]    Clinical Course User Index [CG] Kinnie Feil, PA-C [MB] Hayden Rasmussen, MD     Recent ER visit 2 weeks ago for generalized abdominal pain, CT A/P at that time reviewed, showed extensive stool burden, cholelithiasis and prostate calcifications.  Reports improvement in constipation with MiraLAX over the last 2 weeks now daily BMs, soft and painless.  No hematochezia, melena, n/v, fevers.  Given schizophrenia, report of pain in his rectum, difficulty voiding, DRE was performed today that was unremarkable.  No overtly prominent/nontender prostate on exam.  GU exam reveals hypospadias and I wonder if his anatomy is contributing to his difficulty voiding.  UA is not frankly infected. No leukocytosis.  His abdominal discomfort significantly improved after I/O and subsequent spontaneous urine output on his own.    Given exam, recent benign CT scan, improvement in  constipation and improvement  in tenderness and urine output I will defer emergent imaging here.  He is voiding on his own and I don't think we need to dc with foley.  Creatinine 1.73/GFR 44, slightly elevated compared to previous but creatinine appears to be slowly worsening over time.  Group home reported decreased oral intake and I suspect this is from mild dehydration.  1 L IV fluids given.  He is tolerating fluids and eating in the ER without difficulty.  Patient is considered appropriate for discharge.  Urinary urgency may be from BPH vs UTI.  Urine cx sent, he has no other sxs of UTI so will defer abx today.  I called group home and spoke to [place].  I recommended close monitoring of patient's symptoms and to follow-up with PCP and psychiatrist over the next 48 hours.  I will include this and return precautions in his AVS.    1830:Pt started becoming restless while waiting for PTAR.  Frequent standing up, sitting down, repositioning in hall bed.  Pt reports this is not new. He takes klonopin.  I confirmed this with Hassan Rowan (group home staff) who reports pt has been doing this for 1 month.  Primary psych team aware.  No signs of acute psychosis, cooperative and redirectable.  Awaiting PTAR.   Final Clinical Impressions(s) / ED Diagnoses   2015: re-evaluated pt. Laying comfortable in hall bed, no distress.  Final diagnoses:  Difficulty urinating    ED Discharge Orders    None       Arlean Hopping 02/04/19 2023    Hayden Rasmussen, MD 02/05/19 [phone]

## 2019-02-04 NOTE — Discharge Instructions (Addendum)
You were seen in the ER for difficulty urinating.  Labs today and urine look okay.  You were able to void with an in and out catheter.  You may have some burning with urination over the next 24 to 48 hours.  Stay well-hydrated.  Return to the ER if there is worsening abdominal pain, difficulty urinating, blood in your urine, flank pain, fevers, chills.  Return if you are not able to void urine over the next 12 hours.  Resume taking MiraLAX powder every morning.    You may take Tylenol for pain.  Call your primary care doctor today and make an appointment for 1 week for further evaluation of your symptoms.

## 2019-02-05 ENCOUNTER — Encounter (HOSPITAL_COMMUNITY): Payer: Self-pay | Admitting: *Deleted

## 2019-02-05 ENCOUNTER — Other Ambulatory Visit: Payer: Self-pay

## 2019-02-05 ENCOUNTER — Emergency Department (HOSPITAL_COMMUNITY): Payer: Medicare Other

## 2019-02-05 ENCOUNTER — Emergency Department (HOSPITAL_COMMUNITY)
Admission: EM | Admit: 2019-02-05 | Discharge: 2019-02-06 | Disposition: A | Discharge status: Home / Self Care | Payer: Medicare Other | Attending: Emergency Medicine | Admitting: Emergency Medicine

## 2019-02-05 DIAGNOSIS — R451 Restlessness and agitation: Secondary | ICD-10-CM | POA: Diagnosis present

## 2019-02-05 DIAGNOSIS — Z79899 Other long term (current) drug therapy: Secondary | ICD-10-CM | POA: Diagnosis not present

## 2019-02-05 DIAGNOSIS — N183 Chronic kidney disease, stage 3 (moderate): Secondary | ICD-10-CM | POA: Diagnosis not present

## 2019-02-05 DIAGNOSIS — F329 Major depressive disorder, single episode, unspecified: Secondary | ICD-10-CM | POA: Diagnosis not present

## 2019-02-05 DIAGNOSIS — R1084 Generalized abdominal pain: Secondary | ICD-10-CM | POA: Insufficient documentation

## 2019-02-05 DIAGNOSIS — F1010 Alcohol abuse, uncomplicated: Secondary | ICD-10-CM | POA: Insufficient documentation

## 2019-02-05 DIAGNOSIS — F259 Schizoaffective disorder, unspecified: Secondary | ICD-10-CM | POA: Insufficient documentation

## 2019-02-05 LAB — I-STAT TROPONIN, ED: TROPONIN I, POC: 0 ng/mL (ref 0.00–0.08)

## 2019-02-05 LAB — CBC WITH DIFFERENTIAL/PLATELET
Abs Immature Granulocytes: 0.02 10*3/uL (ref 0.00–0.07)
BASOS ABS: 0.1 10*3/uL (ref 0.0–0.1)
Basophils Relative: 1 %
EOS ABS: 0.1 10*3/uL (ref 0.0–0.5)
EOS PCT: 1 %
HCT: 41.1 % (ref 39.0–52.0)
Hemoglobin: 13.4 g/dL (ref 13.0–17.0)
Immature Granulocytes: 0 %
LYMPHS PCT: 23 %
Lymphs Abs: 1.9 10*3/uL (ref 0.7–4.0)
MCH: 30 pg (ref 26.0–34.0)
MCHC: 32.6 g/dL (ref 30.0–36.0)
MCV: 91.9 fL (ref 80.0–100.0)
MONO ABS: 1 10*3/uL (ref 0.1–1.0)
Monocytes Relative: 11 %
NRBC: 0 % (ref 0.0–0.2)
Neutro Abs: 5.5 10*3/uL (ref 1.7–7.7)
Neutrophils Relative %: 64 %
Platelets: 180 10*3/uL (ref 150–400)
RBC: 4.47 MIL/uL (ref 4.22–5.81)
RDW: 12.5 % (ref 11.5–15.5)
WBC: 8.6 10*3/uL (ref 4.0–10.5)

## 2019-02-05 LAB — COMPREHENSIVE METABOLIC PANEL
ALBUMIN: 4.6 g/dL (ref 3.5–5.0)
ALT: 28 U/L (ref 0–44)
AST: 43 U/L — AB (ref 15–41)
Alkaline Phosphatase: 113 U/L (ref 38–126)
Anion gap: 15 (ref 5–15)
BUN: 21 mg/dL — ABNORMAL HIGH (ref 6–20)
CHLORIDE: 104 mmol/L (ref 98–111)
CO2: 20 mmol/L — ABNORMAL LOW (ref 22–32)
Calcium: 8.4 mg/dL — ABNORMAL LOW (ref 8.9–10.3)
Creatinine, Ser: 1.6 mg/dL — ABNORMAL HIGH (ref 0.61–1.24)
GFR calc Af Amer: 57 mL/min — ABNORMAL LOW (ref >60)
GFR calc non Af Amer: 49 mL/min — ABNORMAL LOW (ref >60)
GLUCOSE: 127 mg/dL — AB (ref 70–99)
POTASSIUM: 3.2 mmol/L — AB (ref 3.5–5.1)
Sodium: 139 mmol/L (ref 135–145)
Total Bilirubin: 0.7 mg/dL (ref 0.3–1.2)
Total Protein: 7.6 g/dL (ref 6.5–8.1)

## 2019-02-05 LAB — URINE CULTURE: Culture: NO GROWTH

## 2019-02-05 LAB — LIPASE, BLOOD: Lipase: 21 U/L (ref 11–51)

## 2019-02-05 LAB — D-DIMER, QUANTITATIVE: D-Dimer, Quant: 0.46 ug/mL-FEU (ref 0.00–0.50)

## 2019-02-05 NOTE — ED Notes (Signed)
Pt observed walking to and from the bathroom without incident.

## 2019-02-05 NOTE — ED Provider Notes (Signed)
[facility] DEPT Provider Note   CSN: [id number] Arrival date & time: 02/05/19  1809    History   Chief Complaint Chief Complaint  Patient presents with  . Panic Attack    HPI Brady Hoffman is a 53 y.o. male.     53 yo M with significant psych history comes in with a chief complaint of agitation.  Patient says is been going on for the past few weeks.  Is not sure what makes it better or worse.  Has a history of panic attacks and thinks this feels the same.  Started earlier this evening and has not changed.  Has not improved.  Describes the feeling of shortness of breath.  Denies cough congestion or fever denies abdominal pain denies history of PE or DVT denies unilateral lower extremity edema denies hemoptysis denies recent surgery or immobilization.  Denies history of MI.  He told me he was seen here yesterday for the same.  The history is provided by the patient.  Illness  Severity:  Moderate Onset quality:  Sudden Duration:  2 days Timing:  Constant Progression:  Worsening Chronicity:  New Associated symptoms: shortness of breath   Associated symptoms: no abdominal pain, no chest pain, no congestion, no diarrhea, no fever, no headaches, no myalgias, no rash and no vomiting     Past Medical History:  Diagnosis Date  . Anemia 01/14/2014  . Anxiety attack   . Chronic kidney disease   . Depression   . H/O: GI bleed   . Hyperlipidemia   . 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 Pervasive developmental disorder   . Schizoaffective disorder   . Seizures [facility])     Patient Active Problem List   Diagnosis Date Noted  . Hallucinations   . Elevated CK 04/23/2014  . Seizure disorder ([place]) 04/23/2014  . CKD (chronic kidney disease), stage III ([place]) 04/23/2014  . 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 Alcohol abuse 04/21/2014  . Low back pain 03/18/2014  . Encephalopathy 01/17/2014  .  Anemia 01/14/2014  . Status epilepticus ([place]) 01/02/2014  . Altered mental status 01/02/2014  . Metabolic acidosis 98/92/1194  . Acute respiratory failure ([place]) 01/02/2014  . Schizoaffective disorder, depressive type ([place]) 10/31/2011  . Acute renal failure ([place]) 10/31/2011  . Dyslipidemia 10/31/2011    Past Surgical History:  Procedure Laterality Date  . ESOPHAGOGASTRODUODENOSCOPY  11/01/2011   Procedure: ESOPHAGOGASTRODUODENOSCOPY (EGD);  Surgeon: Beryle Beams;  Location: [facility] ENDOSCOPY;  Service: Endoscopy;  Laterality: N/A;  . MOLE REMOVAL    . WISDOM TOOTH EXTRACTION          Home Medications    Prior to Admission medications   Medication Sig Start Date End Date Taking? Authorizing Provider  acetaminophen (TYLENOL) 500 MG tablet Take 500-1,000 mg by mouth every 4 (four) hours as needed (pain).    [provider]  aspirin 81 MG chewable tablet Chew 1 tablet (81 mg total) by mouth daily. Patient not taking: Reported on 09/10/2017 05/04/14   Niel Hummer, NP  atorvastatin (LIPITOR) 20 MG tablet Take 1 tablet (20 mg total) by mouth at bedtime. 05/04/14   Niel Hummer, NP  benztropine (COGENTIN) 2 MG tablet Take 1 tablet (2 mg total) by mouth at bedtime. 05/04/14   Niel Hummer, NP  clonazePAM (KLONOPIN) 0.5 MG tablet Take 1 tablet (0.5 mg total) by mouth at bedtime. 05/04/14   Niel Hummer, NP  cloZAPine (CLOZARIL) 100 MG tablet Take 1 tablet (100 mg total) by mouth 2 (two) times daily. 01/11/17   Rankin, Shuvon B, NP  clozapine (CLOZARIL) 200 MG tablet Take 1 tablet (200 mg total) by mouth at bedtime. 05/04/14   Niel Hummer, NP  docusate sodium (COLACE) 100 MG capsule Take 100 mg by mouth 2 (two) times daily.    [provider]  FLUoxetine (PROZAC) 20 MG capsule Take 1 capsule (20 mg total) by mouth daily. 05/04/14   Niel Hummer, NP  folic acid (FOLVITE) 1 MG tablet Take 1 tablet (1 mg total) by mouth daily. 05/04/14   Niel Hummer, NP  levETIRAcetam (KEPPRA  XR) 500 MG 24 hr tablet Take 1 tablet (500 mg total) by mouth 2 (two) times daily. 09/10/17   Penumalli, Earlean Polka, MD  levothyroxine (SYNTHROID, LEVOTHROID) 50 MCG tablet Take 1 tablet (50 mcg total) by mouth daily before breakfast. For underactive thyroid 05/04/14   Niel Hummer, NP  omeprazole (PRILOSEC) 40 MG capsule Take 1 capsule (40 mg total) by mouth daily. 05/04/14   Niel Hummer, NP  polyethylene glycol (MIRALAX / GLYCOLAX) packet Take 17 g by mouth daily. 01/16/19   Jacqlyn Larsen, PA-C    Family History Family History  Adopted: Yes    Social History Social History   Tobacco Use  . Smoking status: Never Smoker  . Smokeless tobacco: Never Used  Substance Use Topics  . Alcohol use: No  . Drug use: No     Allergies   Risperidone and related   Review of Systems Review of Systems  Constitutional: Negative for chills and fever.  HENT: Negative for congestion and facial swelling.   Eyes: Negative for discharge and visual disturbance.  Respiratory: Positive for shortness of breath.   Cardiovascular: Negative for chest pain and palpitations.  Gastrointestinal: Negative for abdominal pain, diarrhea and vomiting.  Musculoskeletal: Negative for arthralgias and myalgias.  Skin: Negative for color change and rash.  Neurological: Negative for tremors, syncope and headaches.  Psychiatric/Behavioral: Negative for confusion and dysphoric mood.     Physical Exam Updated Vital Signs BP (!) 141/104 (BP Location: Right Arm)   Pulse (!) 123   Temp 98.8 F (37.1 C) (Oral)   Resp (!) 22   SpO2 95%   Physical Exam Vitals signs and nursing note reviewed.  Constitutional:      Appearance: He is well-developed.  HENT:     Head: Normocephalic and atraumatic.  Eyes:     Pupils: Pupils are equal, round, and reactive to light.  Neck:     Musculoskeletal: Normal range of motion and neck supple.     Vascular: No JVD.  Cardiovascular:     Rate and Rhythm: Regular rhythm.  Tachycardia present.     Heart sounds: No murmur. No friction rub. No gallop.   Pulmonary:     Effort: No respiratory distress.     Breath sounds: No wheezing.  Abdominal:     General: There is no distension.     Tenderness: There is no guarding or rebound.  Musculoskeletal: Normal range of motion.  Skin:    Coloration: Skin is not pale.     Findings: No rash.  Neurological:     Mental Status: He is alert and oriented to person, place, and time.  Psychiatric:        Behavior: Behavior normal.      ED Treatments / Results  Labs (all labs ordered are listed,  but only abnormal results are displayed) Labs Reviewed  COMPREHENSIVE METABOLIC PANEL - Abnormal; Notable for the following components:      Result Value   Potassium 3.2 (*)    CO2 20 (*)    Glucose, Bld 127 (*)    BUN 21 (*)    Creatinine, Ser 1.60 (*)    Calcium 8.4 (*)    AST 43 (*)    GFR calc non Af Amer 49 (*)    GFR calc Af Amer 57 (*)    All other components within normal limits  CBC WITH DIFFERENTIAL/PLATELET  LIPASE, BLOOD  D-DIMER, QUANTITATIVE (NOT AT [facility])  I-STAT TROPONIN, ED    EKG EKG Interpretation  Date/Time:  Friday February 05 2019 19:33:55 EST Ventricular Rate:  110 PR Interval:    QRS Duration: 86 QT Interval:  364 QTC Calculation: 493 R Axis:   58 Text Interpretation:  Sinus tachycardia Borderline T abnormalities, lateral leads Borderline prolonged QT interval No significant change since last tracing Confirmed by Deno Etienne [phone]) on 02/05/2019 8:11:55 PM   Radiology Dg Chest 2 View  Result Date: 02/05/2019 CLINICAL DATA:  Shortness of breath EXAM: CHEST - 2 VIEW COMPARISON:  None. FINDINGS: The heart size and mediastinal contours are within normal limits. Both lungs are clear. The visualized skeletal structures are unremarkable. IMPRESSION: No active cardiopulmonary disease. Electronically Signed   By: Ulyses Jarred M.D.   On: 02/05/2019 20:14   Ct Renal Stone Study  Result Date:  02/05/2019 CLINICAL DATA:  Flank pain, stone disease suspected EXAM: CT ABDOMEN AND PELVIS WITHOUT CONTRAST TECHNIQUE: Multidetector CT imaging of the abdomen and pelvis was performed following the standard protocol without IV contrast. COMPARISON:  Contrast-enhanced CT 01/16/2019 FINDINGS: Lower chest: The lung bases are clear. Hepatobiliary: Hepatic steatosis with subcentimeter hypodensity in the right lobe of the liver, unchanged from prior but too small to characterize. Calcified gallstones in the gallbladder. No CT findings of gallbladder inflammation. No biliary dilatation. Pancreas: Parenchymal atrophy. No ductal dilatation or inflammation. Spleen: Normal in size without focal abnormality. Adrenals/Urinary Tract: Normal adrenal glands. No renal stones. No hydronephrosis or perinephric edema. Both ureters are decompressed without stones along the course. Urinary bladder is physiologically distended. No bladder stone or wall thickening. Stomach/Bowel: Stomach is within normal limits. Appendix appears normal. No evidence of bowel wall thickening, distention, or inflammatory changes. Large volume of stool in the proximal colon. No colonic wall thickening or inflammatory change. Vascular/Lymphatic: Aortic atherosclerosis without aneurysm. No enlarged lymph nodes in the abdomen or pelvis. Reproductive: Prostate is unremarkable. Other: No free air, free fluid, or intra-abdominal fluid collection. Small fat containing umbilical hernia. Musculoskeletal: Chronic T12 superior endplate deformity is unchanged from prior. There are no acute or suspicious osseous abnormalities. IMPRESSION: 1. No renal stones or obstructive uropathy. No acute abnormality in the abdomen/pelvis. 2. Incidental note of gallstones and hepatic steatosis. Aortic Atherosclerosis (ICD10-I70.0). Electronically Signed   By: Keith Rake M.D.   On: 02/05/2019 22:41    Procedures Procedures (including critical care time)  Medications Ordered in  ED Medications - No data to display   Initial Impression / Assessment and Plan / ED Course  I have reviewed the triage vital signs and the nursing notes.  Pertinent labs & imaging results that were available during my care of the patient were reviewed by me and considered in my medical decision making (see chart for details).        53 yo M with a significant psychiatric history  comes here with a chief complaint of what he calls a panic attack.  This been going on for weeks but worsening over the past couple days.  States he was seen here in the ED yesterday for the same, it looks like he complained more about lower abdominal pain and constipation yesterday.  I wonder if the patient has some sort of pathology that is hidden by his schizophrenia.  Will obtain a repeat laboratory evaluation including a d-dimer and an EKG chest x-ray reassess.  CT with large stool burden as viewed by me.  No acute finding per radiology.  We will have the patient trial a cleanout at home.  PCP follow-up.  10:59 PM:  I have discussed the diagnosis/risks/treatment options with the patient and believe the pt to be eligible for discharge home to follow-up with PCP. We also discussed returning to the ED immediately if new or worsening sx occur. We discussed the sx which are most concerning (e.g., sudden worsening pain, fever, inability to tolerate by mouth) that necessitate immediate return. Medications administered to the patient during their visit and any new prescriptions provided to the patient are listed below.  Medications given during this visit Medications - No data to display   The patient appears reasonably screen and/or stabilized for discharge and I doubt any other medical condition or other [facility] requiring further screening, evaluation, or treatment in the ED at this time prior to discharge.    Final Clinical Impressions(s) / ED Diagnoses   Final diagnoses:  Generalized abdominal pain    ED  Discharge Orders    None       Deno Etienne, DO 02/05/19 2259

## 2019-02-05 NOTE — ED Triage Notes (Signed)
Pt states he feels "sweaty and can't stop moving around". Pt feels like he is having a panic attack. Staff from group home called 911.

## 2019-02-05 NOTE — ED Notes (Signed)
Bed: [facility] Expected date:  Expected time:  Means of arrival:  Comments: 

## 2019-02-05 NOTE — Discharge Instructions (Signed)
Take 8 scoops of miralax in 32oz of whatever you would like to drink.(Gatorade comes in this size) You can also use a fleets enema which you can buy over the counter at the pharmacy.  Return for worsening abdominal pain, vomiting or fever. ? ?

## 2019-02-05 NOTE — ED Notes (Signed)
[facility] called for transport.  

## 2019-09-28 ENCOUNTER — Emergency Department (HOSPITAL_COMMUNITY)
Admission: EM | Admit: 2019-09-28 | Discharge: 2019-09-29 | Disposition: A | Discharge status: Home / Self Care | Payer: Medicare Other | Attending: Emergency Medicine | Admitting: Emergency Medicine

## 2019-09-28 ENCOUNTER — Other Ambulatory Visit: Payer: Self-pay

## 2019-09-28 ENCOUNTER — Encounter (HOSPITAL_COMMUNITY): Payer: Self-pay | Admitting: Emergency Medicine

## 2019-09-28 DIAGNOSIS — R4689 Other symptoms and signs involving appearance and behavior: Secondary | ICD-10-CM

## 2019-09-28 DIAGNOSIS — F919 Conduct disorder, unspecified: Secondary | ICD-10-CM | POA: Insufficient documentation

## 2019-09-28 DIAGNOSIS — F410 Panic disorder [episodic paroxysmal anxiety] without agoraphobia: Secondary | ICD-10-CM | POA: Diagnosis not present

## 2019-09-28 DIAGNOSIS — R443 Hallucinations, unspecified: Secondary | ICD-10-CM | POA: Diagnosis present

## 2019-09-28 DIAGNOSIS — Z046 Encounter for general psychiatric examination, requested by authority: Secondary | ICD-10-CM | POA: Diagnosis not present

## 2019-09-28 DIAGNOSIS — N189 Chronic kidney disease, unspecified: Secondary | ICD-10-CM | POA: Insufficient documentation

## 2019-09-28 DIAGNOSIS — Z79899 Other long term (current) drug therapy: Secondary | ICD-10-CM | POA: Insufficient documentation

## 2019-09-28 LAB — RAPID URINE DRUG SCREEN, HOSP PERFORMED
Amphetamines: NOT DETECTED
Barbiturates: NOT DETECTED
Benzodiazepines: POSITIVE — AB
Cocaine: NOT DETECTED
Opiates: NOT DETECTED
Tetrahydrocannabinol: NOT DETECTED

## 2019-09-28 LAB — COMPREHENSIVE METABOLIC PANEL
ALT: 20 U/L (ref 0–44)
AST: 37 U/L (ref 15–41)
Albumin: 4.3 g/dL (ref 3.5–5.0)
Alkaline Phosphatase: 101 U/L (ref 38–126)
Anion gap: 13 (ref 5–15)
BUN: 19 mg/dL (ref 6–20)
CO2: 22 mmol/L (ref 22–32)
Calcium: 8.8 mg/dL — ABNORMAL LOW (ref 8.9–10.3)
Chloride: 102 mmol/L (ref 98–111)
Creatinine, Ser: 1.58 mg/dL — ABNORMAL HIGH (ref 0.61–1.24)
GFR calc Af Amer: 57 mL/min — ABNORMAL LOW (ref >60)
GFR calc non Af Amer: 49 mL/min — ABNORMAL LOW (ref >60)
Glucose, Bld: 141 mg/dL — ABNORMAL HIGH (ref 70–99)
Potassium: 3.5 mmol/L (ref 3.5–5.1)
Sodium: 137 mmol/L (ref 135–145)
Total Bilirubin: 1 mg/dL (ref 0.3–1.2)
Total Protein: 6.9 g/dL (ref 6.5–8.1)

## 2019-09-28 LAB — CBC
HCT: 38.8 % — ABNORMAL LOW (ref 39.0–52.0)
Hemoglobin: 13.5 g/dL (ref 13.0–17.0)
MCH: 31.4 pg (ref 26.0–34.0)
MCHC: 34.8 g/dL (ref 30.0–36.0)
MCV: 90.2 fL (ref 80.0–100.0)
Platelets: 124 10*3/uL — ABNORMAL LOW (ref 150–400)
RBC: 4.3 MIL/uL (ref 4.22–5.81)
RDW: 12.3 % (ref 11.5–15.5)
WBC: 8.4 10*3/uL (ref 4.0–10.5)
nRBC: 0 % (ref 0.0–0.2)

## 2019-09-28 LAB — ETHANOL: Alcohol, Ethyl (B): <10 mg/dL (ref <10)

## 2019-09-28 NOTE — ED Triage Notes (Signed)
Patient arrived with 2 [employer] officers with IVC papers staff at group home reported patient having auditory hallucinations/bizarre behavior today , family concerned about his safety . Patient denies suicidal ideation .

## 2019-09-29 DIAGNOSIS — F919 Conduct disorder, unspecified: Secondary | ICD-10-CM | POA: Diagnosis not present

## 2019-09-29 LAB — CBC WITH DIFFERENTIAL/PLATELET
Abs Immature Granulocytes: 0.01 10*3/uL (ref 0.00–0.07)
Basophils Absolute: 0 10*3/uL (ref 0.0–0.1)
Basophils Relative: 0 %
Eosinophils Absolute: 0.1 10*3/uL (ref 0.0–0.5)
Eosinophils Relative: 2 %
HCT: 41.1 % (ref 39.0–52.0)
Hemoglobin: 13.5 g/dL (ref 13.0–17.0)
Immature Granulocytes: 0 %
Lymphocytes Relative: 33 %
Lymphs Abs: 1.8 10*3/uL (ref 0.7–4.0)
MCH: 30.4 pg (ref 26.0–34.0)
MCHC: 32.8 g/dL (ref 30.0–36.0)
MCV: 92.6 fL (ref 80.0–100.0)
Monocytes Absolute: 0.5 10*3/uL (ref 0.1–1.0)
Monocytes Relative: 10 %
Neutro Abs: 2.9 10*3/uL (ref 1.7–7.7)
Neutrophils Relative %: 55 %
Platelets: 128 10*3/uL — ABNORMAL LOW (ref 150–400)
RBC: 4.44 MIL/uL (ref 4.22–5.81)
RDW: 12.4 % (ref 11.5–15.5)
WBC: 5.4 10*3/uL (ref 4.0–10.5)
nRBC: 0 % (ref 0.0–0.2)

## 2019-09-29 MED ORDER — LEVOTHYROXINE SODIUM 50 MCG PO TABS
50.0000 ug | ORAL_TABLET | Freq: Every day | ORAL | Status: DC
  Administered 2019-09-29: 50 ug via ORAL
  Filled 2019-09-29: qty 1

## 2019-09-29 MED ORDER — ATORVASTATIN CALCIUM 10 MG PO TABS: 20.0000 mg | ORAL_TABLET | Freq: Every day | ORAL | Status: DC

## 2019-09-29 MED ORDER — DOCUSATE SODIUM 100 MG PO CAPS
100.0000 mg | ORAL_CAPSULE | Freq: Two times a day (BID) | ORAL | Status: DC
  Administered 2019-09-29: 100 mg via ORAL
  Filled 2019-09-29: qty 1

## 2019-09-29 MED ORDER — BENZTROPINE MESYLATE 1 MG PO TABS: 1.0000 mg | ORAL_TABLET | Freq: Every day | ORAL | Status: DC

## 2019-09-29 MED ORDER — CLOZAPINE 100 MG PO TABS
300.0000 mg | ORAL_TABLET | Freq: Every day | ORAL | Status: DC
Start: 2019-09-29 — End: 2019-09-29
  Filled 2019-09-29: qty 3

## 2019-09-29 MED ORDER — POLYETHYLENE GLYCOL 3350 17 G PO PACK
17.0000 g | PACK | Freq: Every day | ORAL | Status: DC
  Administered 2019-09-29: 17 g via ORAL
  Filled 2019-09-29: qty 1

## 2019-09-29 MED ORDER — FLUOXETINE HCL 20 MG PO CAPS
20.0000 mg | ORAL_CAPSULE | Freq: Every day | ORAL | Status: DC
  Administered 2019-09-29: 20 mg via ORAL
  Filled 2019-09-29 (×2): qty 1

## 2019-09-29 MED ORDER — CLOZAPINE 100 MG PO TABS
200.0000 mg | ORAL_TABLET | Freq: Every day | ORAL | Status: DC
  Filled 2019-09-29: qty 2

## 2019-09-29 MED ORDER — FOLIC ACID 1 MG PO TABS
1.0000 mg | ORAL_TABLET | Freq: Every day | ORAL | Status: DC
  Administered 2019-09-29: 1 mg via ORAL
  Filled 2019-09-29: qty 1

## 2019-09-29 MED ORDER — ACETAMINOPHEN 500 MG PO TABS: 1000.0000 mg | ORAL_TABLET | Freq: Four times a day (QID) | ORAL | Status: DC | PRN

## 2019-09-29 MED ORDER — ASPIRIN 81 MG PO CHEW
81.0000 mg | CHEWABLE_TABLET | Freq: Every day | ORAL | Status: DC
  Administered 2019-09-29: 81 mg via ORAL
  Filled 2019-09-29: qty 1

## 2019-09-29 MED ORDER — PANTOPRAZOLE SODIUM 40 MG PO TBEC
80.0000 mg | DELAYED_RELEASE_TABLET | Freq: Every day | ORAL | Status: DC
  Administered 2019-09-29: 80 mg via ORAL
  Filled 2019-09-29: qty 2

## 2019-09-29 MED ORDER — CLONAZEPAM 0.5 MG PO TABS
0.5000 mg | ORAL_TABLET | Freq: Every day | ORAL | Status: DC
Start: 2019-09-29 — End: 2019-09-29

## 2019-09-29 MED ORDER — LEVETIRACETAM ER 500 MG PO TB24
500.0000 mg | ORAL_TABLET | Freq: Two times a day (BID) | ORAL | Status: DC
  Filled 2019-09-29 (×3): qty 1

## 2019-09-29 NOTE — ED Provider Notes (Signed)
TIME SEEN: 3:11 AM  CHIEF COMPLAINT: IVC  HPI: Patient is a 53 year old male with history of developmental disorder, schizoaffective disorder, seizures who presents to the emergency department from his group home under involuntary commitment.  Per IVC paperwork patient has had bizarre behavior and they are concerned he is a danger to himself and others.  States that he has been having panic attacks and been locking himself in the bathroom for hours.  Patient is unable to tell me why he is here today.  He denies SI, HI or hallucinations.  No fevers, cough, vomiting or diarrhea.  ROS: See HPI Constitutional: no fever  Eyes: no drainage  ENT: no runny nose   Cardiovascular:  no chest pain  Resp: no SOB  GI: no vomiting GU: no dysuria Integumentary: no rash  Allergy: no hives  Musculoskeletal: no leg swelling  Neurological: no slurred speech ROS otherwise negative  PAST MEDICAL HISTORY/PAST SURGICAL HISTORY:  Past Medical History:  Diagnosis Date  . Anemia 01/14/2014  . Anxiety attack   . Chronic kidney disease   . Depression   . H/O: GI bleed   . Hyperlipidemia   . 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 Pervasive developmental disorder   . Schizoaffective disorder   . Seizures ([place])     MEDICATIONS:  Prior to Admission medications   Medication Sig Start Date End Date Taking? Authorizing Provider  acetaminophen (TYLENOL) 500 MG tablet Take 500-1,000 mg by mouth every 4 (four) hours as needed (pain).    [provider]  aspirin 81 MG chewable tablet Chew 1 tablet (81 mg total) by mouth daily. Patient not taking: Reported on 09/10/2017 05/04/14   Niel Hummer, NP  atorvastatin (LIPITOR) 20 MG tablet Take 1 tablet (20 mg total) by mouth at bedtime. 05/04/14   Niel Hummer, NP  benztropine (COGENTIN) 2 MG tablet Take 1 tablet (2 mg total) by mouth at bedtime. 05/04/14   Niel Hummer, NP  clonazePAM (KLONOPIN) 0.5 MG tablet Take 1 tablet (0.5 mg total)  by mouth at bedtime. 05/04/14   Niel Hummer, NP  cloZAPine (CLOZARIL) 100 MG tablet Take 1 tablet (100 mg total) by mouth 2 (two) times daily. 01/11/17   Rankin, Shuvon B, NP  clozapine (CLOZARIL) 200 MG tablet Take 1 tablet (200 mg total) by mouth at bedtime. 05/04/14   Niel Hummer, NP  docusate sodium (COLACE) 100 MG capsule Take 100 mg by mouth 2 (two) times daily.    [provider]  FLUoxetine (PROZAC) 20 MG capsule Take 1 capsule (20 mg total) by mouth daily. 05/04/14   Niel Hummer, NP  folic acid (FOLVITE) 1 MG tablet Take 1 tablet (1 mg total) by mouth daily. 05/04/14   Niel Hummer, NP  levETIRAcetam (KEPPRA XR) 500 MG 24 hr tablet Take 1 tablet (500 mg total) by mouth 2 (two) times daily. 09/10/17   Penumalli, Earlean Polka, MD  levothyroxine (SYNTHROID, LEVOTHROID) 50 MCG tablet Take 1 tablet (50 mcg total) by mouth daily before breakfast. For underactive thyroid 05/04/14   Niel Hummer, NP  omeprazole (PRILOSEC) 40 MG capsule Take 1 capsule (40 mg total) by mouth daily. 05/04/14   Niel Hummer, NP  polyethylene glycol (MIRALAX / GLYCOLAX) packet Take 17 g by mouth daily. 01/16/19   Jacqlyn Larsen, PA-C    ALLERGIES:  Allergies  Allergen Reactions  . Risperidone And Related Other (See Comments)    Pt.  States head feels like a rock    SOCIAL HISTORY:  Social History   Tobacco Use  . Smoking status: Never Smoker  . Smokeless tobacco: Never Used  Substance Use Topics  . Alcohol use: No    FAMILY HISTORY: Family History  Adopted: Yes    EXAM: BP 105/89   Pulse (!) 104   Temp 97.6 F (36.4 C) (Oral)   Resp 16   SpO2 100%  CONSTITUTIONAL: Alert and oriented and responds appropriately to most questions.  Patient appears to have some cognitive delay. HEAD: Normocephalic EYES: Conjunctivae clear, pupils appear equal, EOMI ENT: normal nose; moist mucous membranes NECK: Supple, no meningismus, no nuchal rigidity, no LAD  CARD: Regular and minimally tachycardic; S1  and S2 appreciated; no murmurs, no clicks, no rubs, no gallops RESP: Normal chest excursion without splinting or tachypnea; breath sounds clear and equal bilaterally; no wheezes, no rhonchi, no rales, no hypoxia or respiratory distress, speaking full sentences ABD/GI: Normal bowel sounds; non-distended; soft, non-tender, no rebound, no guarding, no peritoneal signs, no hepatosplenomegaly BACK:  The back appears normal and is non-tender to palpation, there is no CVA tenderness EXT: Normal ROM in all joints; non-tender to palpation; no edema; normal capillary refill; no cyanosis, no calf tenderness or swelling    SKIN: Normal color for age and race; warm; no rash NEURO: Moves all extremities equally PSYCH: Calm and cooperative.  Denies SI, HI or hallucinations.  MEDICAL DECISION MAKING: Patient here under IVC from his group home.  Screening labs unremarkable other than creatinine of 1.58 which is patient's baseline.  Drug screen positive for benzodiazepines but patient takes Klonopin regularly.  Currently medically cleared and awaiting TTS evaluation.  I have completed the first examination paperwork.  ED PROGRESS: Medically cleared and awaiting psychiatric evaluation for further disposition.   I reviewed all nursing notes and pertinent previous records as available.  I have interpreted any EKGs, lab and urine results, imaging (as available).   Brady Hoffman was evaluated in Emergency Department on 09/29/2019 for the symptoms described in the history of present illness. He was evaluated in the context of the global COVID-19 pandemic, which necessitated consideration that the patient might be at risk for infection with the SARS-CoV-2 virus that causes COVID-19. Institutional protocols and algorithms that pertain to the evaluation of patients at risk for COVID-19 are in a state of rapid change based on information released by regulatory bodies including the CDC and federal and state organizations.  These policies and algorithms were followed during the patient's care in the ED.    Bobbie Virden, Delice Bison, DO 09/29/19 [phone]

## 2019-09-29 NOTE — BH Assessment (Signed)
Pt is not currently roomed so is not able to participate in a [facility] Assessment at this time due to a lack of privacy.

## 2019-09-29 NOTE — ED Notes (Signed)
Pt not in room. Pt is in a hallway in another pod.

## 2019-09-29 NOTE — Progress Notes (Signed)
CSW spoke with [name] at [facility] and informed her of pt's disposition. She will call her administrator and have her call CSW back to discuss options to get pt back to the group home.   Audree Camel, LCSW, [place] Disposition [facility]/TTS [phone]

## 2019-09-29 NOTE — BH Assessment (Signed)
Tele Assessment Note   Patient Name: Brady Hoffman MRN: [id number] Referring Physician: Raliegh Ip. Ward, DO Location of Patient: [facility] Location of Provider: Behavioral Health TTS Department  Brady Hoffman is a 53 y.o. male who presented to [facility] on involuntary basis (petitioner is Pt's group home, [facility]) due to increased anxiety/panic, as well as ongoing auditory hallucinations.  Pt was last assessed by TTS in 2015, and before that, 2013 for Schizoaffective symptoms.  Pt is followed by [name].  Pt was assessed.  He stated that he has felt anxious lately, and he endorsed a history of panic attacks.  Pt denied suicidal ideation, homicidal ideation, visual hallucination, and substance use concerns.  Pt stated that he has ongoing auditory hallucinations -- voices telling him that he is going to die.  Pt reported that while these voices represent his baseline, they have increased recently.  Pt endorsed a history of cutting, bu the last instance was 30 years ago.  Pt stated that he is followed by [name], and that [name] changed his medication recently.  Pt endorsed good sleep and fair appetite. ''I'm not that hungry.''  Chief Strategy Officer also spoke with [name] at [facility].  [name] stated that Pt has had an increase of anxiety and panic recently.  The staff petitioned for IVC because they are concerned for Pt's physical health -- the last time he had a panic attack, he fell and suffered head trauma.  Per [name], [name] increased Pt's dosage of Clozaril.    During assessment, Pt presented as alert and oriented.  He had good eye contact and was cooperative.  Pt was dressed in street clothes, and he appeared appropriately groomed.  Pt's demeanor was pleasant.  Mood was anxious.  Affect was slightly anxious.  Pt's speech was normal in rate, rhythm, and volume.  Thought processes were within normal range, and thought content was logical and goal-oriented.  There is no evidence  of delusion.  Pt's memory and concentration were fair.  Insight, judgment, and impulse control were fair to good.  Consulted with [name], NP, who determined that Pt is psych-cleared as Pt denies suicidal ideation, past suicide attempts, homicidal ideation, and his hallucinations are baseline.  Diagnosis: Schizoaffective Disorder; Anxiety; PDD  Past Medical History:  Past Medical History:  Diagnosis Date  . Anemia 01/14/2014  . Anxiety attack   . Chronic kidney disease   . Depression   . H/O: GI bleed   . Hyperlipidemia   . 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 Pervasive developmental disorder   . Schizoaffective disorder   . Seizures ([place])     Past Surgical History:  Procedure Laterality Date  . ESOPHAGOGASTRODUODENOSCOPY  11/01/2011   Procedure: ESOPHAGOGASTRODUODENOSCOPY (EGD);  Surgeon: Beryle Beams;  Location: [facility] ENDOSCOPY;  Service: Endoscopy;  Laterality: N/A;  . MOLE REMOVAL    . WISDOM TOOTH EXTRACTION      Family History:  Family History  Adopted: Yes    Social History:  reports that he has never smoked. He has never used smokeless tobacco. He reports that he does not drink alcohol or use drugs.  Additional Social History:  Alcohol / Drug Use Pain Medications: See MAR Prescriptions: See MAR Over the Counter: See MAR History of alcohol / drug use?: No history of alcohol / drug abuse  CIWA: CIWA-Ar BP: 105/89 Pulse Rate: (!) 104 COWS:    Allergies:  Allergies  Allergen Reactions  . Risperidone And  Related Other (See Comments)    Pt. States head feels like a rock    Home Medications: (Not in a hospital admission)   OB/GYN Status:  No LMP for male patient.  General Assessment Data Assessment unable to be completed: Yes Reason for not completing assessment: Pt is not currently in a room in which his [place] Assessment can be completed Location of Assessment: [facility] ED TTS Assessment: In system Is this a Tele or Face-to-Face  Assessment?: Tele Assessment Is this an Initial Assessment or a Re-assessment for this encounter?: Initial Assessment Patient Accompanied by:: N/A Language Other than English: No Living Arrangements: In Group Home: (Comment: Name of Group Home)([facility]) What gender do you identify as?: Male Marital status: Single Pregnancy Status: No Living Arrangements: Group Home([facility]) Can pt return to current living arrangement?: No Admission Status: Involuntary Petitioner: Other(Group home) Is patient capable of signing voluntary admission?: No Referral Source: Other(Group home) Insurance type: [place] [employer]     Crisis Care Plan Living Arrangements: Group Home([facility]) Name of Psychiatrist: [name]     Risk to self with the past 6 months Suicidal Ideation: No Has patient been a risk to self within the past 6 months prior to admission? : No Suicidal Intent: No Has patient had any suicidal intent within the past 6 months prior to admission? : No Is patient at risk for suicide?: No Suicidal Plan?: No Has patient had any suicidal plan within the past 6 months prior to admission? : No Access to Means: No What has been your use of drugs/alcohol within the last 12 months?: Denied Previous Attempts/Gestures: No Intentional Self Injurious Behavior: Cutting Comment - Self Injurious Behavior: Hx of cutting, but not recently Family Suicide History: Unknown Recent stressful life event(s): Other (Comment)(Panic attack) Persecutory voices/beliefs?: Yes Depression: Yes Depression Symptoms: Despondent, Feeling worthless/self pity Substance abuse history and/or treatment for substance abuse?: No Suicide prevention information given to non-admitted patients: Not applicable  Risk to Others within the past 6 months Homicidal Ideation: No Does patient have any lifetime risk of violence toward others beyond the six months prior to admission? : No Thoughts of Harm to Others: No Current  Homicidal Intent: No Current Homicidal Plan: No Access to Homicidal Means: No History of harm to others?: No Assessment of Violence: None Noted Criminal Charges Pending?: No Does patient have a court date: No Is patient on probation?: Unknown  Psychosis Hallucinations: Auditory(Voices telling him that he is going to die -- baseline) Delusions: None noted  Mental Status Report Appearance/Hygiene: Unremarkable, Other (Comment)(street clothes) Eye Contact: Good Motor Activity: Freedom of movement, Unremarkable Speech: Logical/coherent Level of Consciousness: Alert Mood: Anxious, Pleasant Affect: Anxious Anxiety Level: Panic Attacks Panic attack frequency: Episodic (No cue) Most recent panic attack: 09/28/2019 Thought Processes: Coherent, Relevant Orientation: Person, Place, Time, Situation Obsessive Compulsive Thoughts/Behaviors: None  Cognitive Functioning Concentration: Normal Memory: Remote Intact, Recent Intact Is patient IDD: (Per history, Pt has IDD) Insight: Poor Impulse Control: Fair Appetite: Fair Have you had any weight changes? : Loss Amount of the weight change? (lbs): (Not sure) Sleep: No Change Total Hours of Sleep: 8  ADLScreening [facility] Assessment Services) Patient's cognitive ability adequate to safely complete daily activities?: Yes Patient able to express need for assistance with ADLs?: Yes Independently performs ADLs?: Yes (appropriate for developmental age)  Prior Inpatient Therapy Prior Inpatient Therapy: Yes Prior Therapy Dates: 2015 and other Prior Therapy Facilty/Provider(s): [facility], [facility] and others Reason for Treatment: Schizoaffective Disorder  Prior Outpatient Therapy Prior Outpatient  Therapy: Yes Prior Therapy Dates: Ongoing Prior Therapy Facilty/Provider(s): [name] Reason for Treatment: Schizoaffective D/O Does patient have an ACCT team?: No Does patient have Intensive In-House Services?  : No Does patient have [facility] services?  : No Does patient have P4CC services?: No  ADL Screening (condition at time of admission) Patient's cognitive ability adequate to safely complete daily activities?: Yes Is the patient deaf or have difficulty hearing?: No Does the patient have difficulty seeing, even when wearing glasses/contacts?: No Does the patient have difficulty concentrating, remembering, or making decisions?: No Patient able to express need for assistance with ADLs?: Yes Does the patient have difficulty dressing or bathing?: No Independently performs ADLs?: Yes (appropriate for developmental age) Does the patient have difficulty walking or climbing stairs?: No Weakness of Legs: None Weakness of Arms/Hands: None  Home Assistive Devices/Equipment Home Assistive Devices/Equipment: None  Therapy Consults (therapy consults require a physician order) PT Evaluation Needed: No OT Evalulation Needed: No SLP Evaluation Needed: No Abuse/Neglect Assessment (Assessment to be complete while patient is alone) Abuse/Neglect Assessment Can Be Completed: Yes Physical Abuse: Denies Verbal Abuse: Denies Sexual Abuse: Denies Exploitation of patient/patient's resources: Denies Self-Neglect: Denies Values / Beliefs Cultural Requests During Hospitalization: None Spiritual Requests During Hospitalization: None Consults Spiritual Care Consult Needed: No Social Work Consult Needed: No Regulatory affairs officer (For Healthcare) Does Patient Have a Medical Advance Directive?: No          Disposition:  Disposition Initial Assessment Completed for this Encounter: Yes  This service was provided via telemedicine using a 2-way, interactive audio and Radiographer, therapeutic.  Names of all persons participating in this telemedicine service and their role in this encounter. Name: Mal Amabile Role: Patient  Name: [name] Role: Cowan T Kae Lauman 09/29/2019 8:25 AM

## 2019-09-29 NOTE — ED Notes (Signed)
Pt ambulated to restroom. 

## 2019-09-29 NOTE — ED Notes (Signed)
Biagio Borg-- Caregiver from group home (worked with pt for 21yrs) -- who took IVC papers out -- would like to be called with update from counselor-- [phone].

## 2019-09-29 NOTE — ED Notes (Signed)
Staff from group home spoke to this RN, staff member states that she is upset that pt is being discharged. She wants the pt admitted. This RN reiterated that the pt has been psychiatrically cleared and needs to be picked up. Staff member adamant about pt being admitted, this RN referred staff member to [facility].

## 2019-09-29 NOTE — ED Notes (Signed)
Belongings returned to pt. Pts AVS given to staff member from group home. Pt taken back to group home by staff via their transport.

## 2019-09-29 NOTE — ED Provider Notes (Signed)
Pt cleared by psych and no longer thought to be a threat to himself or others.  His ride for the group home is here and pt d/ced with outpt resources.   Blanchie Dessert, MD 09/29/19 1428

## 2019-09-29 NOTE — ED Notes (Signed)
Pts mother called, requesting updates. Mother wanting pt to stay in the hospital. This RN advised mother to speak with [facility].

## 2019-09-29 NOTE — ED Notes (Signed)
Regular Diet was ordered for Lunch. 

## 2019-09-29 NOTE — ED Notes (Signed)
Patient refused a Snack. 

## 2021-10-02 ENCOUNTER — Other Ambulatory Visit: Payer: Self-pay | Admitting: Internal Medicine

## 2021-10-03 LAB — COMPLETE METABOLIC PANEL WITHOUT GFR
AG Ratio: 2 (calc) (ref 1.0–2.5)
ALT: 14 U/L (ref 9–46)
AST: 15 U/L (ref 10–35)
Albumin: 4.6 g/dL (ref 3.6–5.1)
Alkaline phosphatase (APISO): 90 U/L (ref 35–144)
BUN: 18 mg/dL (ref 7–25)
CO2: 25 mmol/L (ref 20–32)
Calcium: 8.7 mg/dL (ref 8.6–10.3)
Chloride: 105 mmol/L (ref 98–110)
Creat: 1.25 mg/dL (ref 0.70–1.30)
Globulin: 2.3 g/dL (ref 1.9–3.7)
Glucose, Bld: 83 mg/dL (ref 65–99)
Potassium: 4.4 mmol/L (ref 3.5–5.3)
Sodium: 141 mmol/L (ref 135–146)
Total Bilirubin: 0.4 mg/dL (ref 0.2–1.2)
Total Protein: 6.9 g/dL (ref 6.1–8.1)
eGFR: 68 mL/min/{1.73_m2}

## 2021-10-03 LAB — CBC
HCT: 41 % (ref 38.5–50.0)
Hemoglobin: 13.9 g/dL (ref 13.2–17.1)
MCH: 31.3 pg (ref 27.0–33.0)
MCHC: 33.9 g/dL (ref 32.0–36.0)
MCV: 92.3 fL (ref 80.0–100.0)
MPV: 12 fL (ref 7.5–12.5)
Platelets: 133 10*3/uL — ABNORMAL LOW (ref 140–400)
RBC: 4.44 10*6/uL (ref 4.20–5.80)
RDW: 12.5 % (ref 11.0–15.0)
WBC: 5.3 10*3/uL (ref 3.8–10.8)

## 2021-10-03 LAB — LIPID PANEL
Cholesterol: 120 mg/dL (ref <200)
HDL: 39 mg/dL — ABNORMAL LOW (ref >=40)
LDL Cholesterol (Calc): 59 mg/dL (calc)
Non-HDL Cholesterol (Calc): 81 mg/dL (calc) (ref <130)
Total CHOL/HDL Ratio: 3.1 (calc) (ref <5.0)
Triglycerides: 134 mg/dL (ref <150)

## 2021-10-03 LAB — FOLATE: Folate: >24 ng/mL

## 2021-10-03 LAB — VITAMIN B12: Vitamin B-12: 277 pg/mL (ref 200–1100)

## 2021-10-03 LAB — VITAMIN D 25 HYDROXY (VIT D DEFICIENCY, FRACTURES): Vit D, 25-Hydroxy: 18 ng/mL — ABNORMAL LOW (ref 30–100)

## 2021-10-03 LAB — TSH: TSH: 4.65 m[IU]/L — ABNORMAL HIGH (ref 0.40–4.50)

## 2021-10-03 LAB — PSA: PSA: 0.76 ng/mL

## 2021-10-03 LAB — T4, FREE: Free T4: 1.1 ng/dL (ref 0.8–1.8)

## 2023-07-03 ENCOUNTER — Other Ambulatory Visit: Payer: Self-pay | Admitting: Internal Medicine

## 2023-09-19 ENCOUNTER — Emergency Department (EMERGENCY_DEPARTMENT_HOSPITAL)
Admission: EM | Admit: 2023-09-19 | Discharge: 2023-09-21 | Disposition: A | Discharge status: Other Acute Inpatient Hospital | Payer: Medicare Other | Source: Home / Self Care | Attending: Emergency Medicine | Admitting: Emergency Medicine

## 2023-09-19 ENCOUNTER — Emergency Department (HOSPITAL_COMMUNITY): Payer: Medicare Other

## 2023-09-19 DIAGNOSIS — R519 Headache, unspecified: Secondary | ICD-10-CM | POA: Insufficient documentation

## 2023-09-19 DIAGNOSIS — Y92199 Unspecified place in other specified residential institution as the place of occurrence of the external cause: Secondary | ICD-10-CM | POA: Insufficient documentation

## 2023-09-19 DIAGNOSIS — F1310 Sedative, hypnotic or anxiolytic abuse, uncomplicated: Secondary | ICD-10-CM | POA: Insufficient documentation

## 2023-09-19 DIAGNOSIS — S60221A Contusion of right hand, initial encounter: Secondary | ICD-10-CM | POA: Insufficient documentation

## 2023-09-19 DIAGNOSIS — R000 Tachycardia, unspecified: Secondary | ICD-10-CM | POA: Insufficient documentation

## 2023-09-19 DIAGNOSIS — W01190A Fall on same level from slipping, tripping and stumbling with subsequent striking against furniture, initial encounter: Secondary | ICD-10-CM | POA: Insufficient documentation

## 2023-09-19 DIAGNOSIS — Y900 Blood alcohol level of less than 20 mg/100 ml: Secondary | ICD-10-CM | POA: Insufficient documentation

## 2023-09-19 DIAGNOSIS — F251 Schizoaffective disorder, depressive type: Secondary | ICD-10-CM | POA: Insufficient documentation

## 2023-09-19 DIAGNOSIS — F419 Anxiety disorder, unspecified: Secondary | ICD-10-CM | POA: Insufficient documentation

## 2023-09-19 DIAGNOSIS — N189 Chronic kidney disease, unspecified: Secondary | ICD-10-CM | POA: Insufficient documentation

## 2023-09-19 DIAGNOSIS — R296 Repeated falls: Secondary | ICD-10-CM | POA: Diagnosis not present

## 2023-09-19 DIAGNOSIS — Z7982 Long term (current) use of aspirin: Secondary | ICD-10-CM | POA: Insufficient documentation

## 2023-09-19 DIAGNOSIS — R443 Hallucinations, unspecified: Secondary | ICD-10-CM | POA: Diagnosis present

## 2023-09-19 LAB — COMPREHENSIVE METABOLIC PANEL
ALT: 19 U/L (ref 0–44)
AST: 21 U/L (ref 15–41)
Albumin: 3.8 g/dL (ref 3.5–5.0)
Alkaline Phosphatase: 80 U/L (ref 38–126)
Anion gap: 8 (ref 5–15)
BUN: 24 mg/dL — ABNORMAL HIGH (ref 6–20)
CO2: 24 mmol/L (ref 22–32)
Calcium: 8.3 mg/dL — ABNORMAL LOW (ref 8.9–10.3)
Chloride: 110 mmol/L (ref 98–111)
Creatinine, Ser: 1.52 mg/dL — ABNORMAL HIGH (ref 0.61–1.24)
GFR, Estimated: 53 mL/min — ABNORMAL LOW (ref >60)
Glucose, Bld: 131 mg/dL — ABNORMAL HIGH (ref 70–99)
Potassium: 3.6 mmol/L (ref 3.5–5.1)
Sodium: 142 mmol/L (ref 135–145)
Total Bilirubin: 0.8 mg/dL (ref 0.3–1.2)
Total Protein: 7.1 g/dL (ref 6.5–8.1)

## 2023-09-19 LAB — ETHANOL: Alcohol, Ethyl (B): <10 mg/dL (ref <10)

## 2023-09-19 NOTE — ED Notes (Signed)
PT states his unable to stand for the orthostatic vitals , pt said his unable to provide urine at the moment

## 2023-09-19 NOTE — ED Provider Notes (Incomplete)
[place] EMERGENCY DEPARTMENT AT [facility] Provider Note   CSN: [id number] Arrival date & time: 09/19/23  2053     History {Add pertinent medical, surgical, social history, OB history to HPI:1} Chief Complaint  Patient presents with   Fall   Anxiety    Brady Hoffman is a 57 y.o. male.  The history is provided by the patient and medical records.  Fall  Anxiety   57 year old male with history of dyslipidemia, alcohol abuse, anemia, CKD, schizoaffective disorder, seizure disorder, developmental delay, presenting to the ED with multiple falls.  Reports over the past 6 months he has been falling repeatedly.  He does admit that occasionally he feels dizzy prior to falling, other times he seems to be falling for no reason.  He fell once again today, struck his hand on the wall and fell to the floor.  He does report striking his head but denies any loss of consciousness.  He states he has hit his head quite often recently.  He denies any nausea or vomiting.  Denies any frank confusion.  He is not having any focal numbness or weakness.  No blurred vision.  Home Medications Prior to Admission medications   Medication Sig Start Date End Date Taking? Authorizing Provider  acetaminophen (TYLENOL) 500 MG tablet Take 500-1,000 mg by mouth every 4 (four) hours as needed (pain).    [provider]  aspirin 81 MG chewable tablet Chew 1 tablet (81 mg total) by mouth daily. 05/04/14   Thermon Leyland, NP  atorvastatin (LIPITOR) 20 MG tablet Take 1 tablet (20 mg total) by mouth at bedtime. 05/04/14   Thermon Leyland, NP  clonazePAM (KLONOPIN) 0.5 MG tablet Take 1 tablet (0.5 mg total) by mouth at bedtime. 05/04/14   Thermon Leyland, NP  cloZAPine (CLOZARIL) 100 MG tablet Take 1 tablet (100 mg total) by mouth 2 (two) times daily. Patient taking differently: Take 200-300 mg by mouth See admin instructions. Take 200mg  in the morning and 300mg  at bedtime 01/11/17   Rankin, Shuvon B, NP   docusate sodium (COLACE) 100 MG capsule Take 100 mg by mouth daily.     [provider]  FLUoxetine (PROZAC) 20 MG capsule Take 1 capsule (20 mg total) by mouth daily. Patient taking differently: Take 20 mg by mouth at bedtime.  05/04/14   Thermon Leyland, NP  folic acid (FOLVITE) 400 MCG tablet Take 1,000 mcg by mouth daily.    [provider]  levETIRAcetam (KEPPRA XR) 500 MG 24 hr tablet Take 1 tablet (500 mg total) by mouth 2 (two) times daily. 09/10/17   Penumalli, Glenford Bayley, MD  levothyroxine (SYNTHROID, LEVOTHROID) 50 MCG tablet Take 1 tablet (50 mcg total) by mouth daily before breakfast. For underactive thyroid 05/04/14   Thermon Leyland, NP  omeprazole (PRILOSEC) 40 MG capsule Take 1 capsule (40 mg total) by mouth daily. 05/04/14   Thermon Leyland, NP      Allergies    Risperidone and related    Review of Systems   Review of Systems  Musculoskeletal:  Positive for arthralgias.  All other systems reviewed and are negative.   Physical Exam Updated Vital Signs BP 127/83   Pulse (!) 107   Temp 98.2 F (36.8 C) (Oral)   Resp 18   Ht 6\' 3"  (1.905 m)   Wt 97.5 kg   SpO2 98%   BMI 26.87 kg/m   Physical Exam Vitals and nursing note reviewed.  Constitutional:  Appearance: He is well-developed.  HENT:     Head: Normocephalic and atraumatic.     Comments: No visible head trauma Eyes:     Conjunctiva/sclera: Conjunctivae normal.     Pupils: Pupils are equal, round, and reactive to light.  Cardiovascular:     Rate and Rhythm: Normal rate and regular rhythm.     Heart sounds: Normal heart sounds.  Pulmonary:     Effort: Pulmonary effort is normal.     Breath sounds: Normal breath sounds.  Abdominal:     General: Bowel sounds are normal.     Palpations: Abdomen is soft.  Musculoskeletal:        General: Normal range of motion.     Cervical back: Normal range of motion.     Comments: Bruising right dorsal hand, mildly tender, no significant swelling, no  deformities, radial pulse intact, normal grip strength, normal ROM  Skin:    General: Skin is warm and dry.  Neurological:     Mental Status: He is alert and oriented to person, place, and time.     Comments: AAOx3, answering questions and following commands appropriately; equal strength UE and LE bilaterally; CN grossly intact; moves all extremities appropriately without ataxia; no focal neuro deficits or facial asymmetry appreciated  Psychiatric:     Comments: Odd affect     ED Results / Procedures / Treatments   Labs (all labs ordered are listed, but only abnormal results are displayed) Labs Reviewed - No data to display  EKG None  Radiology No results found.  Procedures Procedures  {Document cardiac monitor, telemetry assessment procedure when appropriate:1}  Medications Ordered in ED Medications - No data to display  ED Course/ Medical Decision Making/ A&P   {   Click here for ABCD2, HEART and other calculatorsREFRESH Note before signing :1}                              Medical Decision Making Amount and/or Complexity of Data Reviewed Labs: ordered. Radiology: ordered. ECG/medicine tests: ordered.   ***  {Document critical care time when appropriate:1} {Document review of labs and clinical decision tools ie heart score, Chads2Vasc2 etc:1}  {Document your independent review of radiology images, and any outside records:1} {Document your discussion with family members, caretakers, and with consultants:1} {Document social determinants of health affecting pt's care:1} {Document your decision making why or why not admission, treatments were needed:1} Final Clinical Impression(s) / ED Diagnoses Final diagnoses:  None    Rx / DC Orders ED Discharge Orders     None

## 2023-09-19 NOTE — ED Triage Notes (Addendum)
Patient in today reporting ongoing falls since March at the group home. Visiting mother this weekend whom brought him here - lives in [place]. Reports ongoing anxiety with repetitive behaviors. Larey Seat today into chair and injured right hand. Redness noted. Patient able to answer questions.

## 2023-09-20 ENCOUNTER — Encounter (HOSPITAL_COMMUNITY): Payer: Self-pay

## 2023-09-20 ENCOUNTER — Other Ambulatory Visit: Payer: Self-pay

## 2023-09-20 DIAGNOSIS — F251 Schizoaffective disorder, depressive type: Secondary | ICD-10-CM

## 2023-09-20 LAB — RAPID URINE DRUG SCREEN, HOSP PERFORMED
Amphetamines: NOT DETECTED
Barbiturates: NOT DETECTED
Benzodiazepines: POSITIVE — AB
Cocaine: NOT DETECTED
Opiates: NOT DETECTED
Tetrahydrocannabinol: NOT DETECTED

## 2023-09-20 LAB — CBC WITH DIFFERENTIAL/PLATELET
Abs Immature Granulocytes: 0.01 10*3/uL (ref 0.00–0.07)
Basophils Absolute: 0 10*3/uL (ref 0.0–0.1)
Basophils Relative: 0 %
Eosinophils Absolute: 0.1 10*3/uL (ref 0.0–0.5)
Eosinophils Relative: 1 %
HCT: 35.1 % — ABNORMAL LOW (ref 39.0–52.0)
Hemoglobin: 11.9 g/dL — ABNORMAL LOW (ref 13.0–17.0)
Immature Granulocytes: 0 %
Lymphocytes Relative: 17 %
Lymphs Abs: 1.3 10*3/uL (ref 0.7–4.0)
MCH: 31.9 pg (ref 26.0–34.0)
MCHC: 33.9 g/dL (ref 30.0–36.0)
MCV: 94.1 fL (ref 80.0–100.0)
Monocytes Absolute: 0.9 10*3/uL (ref 0.1–1.0)
Monocytes Relative: 12 %
Neutro Abs: 5.3 10*3/uL (ref 1.7–7.7)
Neutrophils Relative %: 70 %
Platelets: 122 10*3/uL — ABNORMAL LOW (ref 150–400)
RBC: 3.73 MIL/uL — ABNORMAL LOW (ref 4.22–5.81)
RDW: 12.3 % (ref 11.5–15.5)
WBC: 7.5 10*3/uL (ref 4.0–10.5)
nRBC: 0 % (ref 0.0–0.2)

## 2023-09-20 LAB — URINALYSIS, W/ REFLEX TO CULTURE (INFECTION SUSPECTED)
Bacteria, UA: NONE SEEN
Bilirubin Urine: NEGATIVE
Glucose, UA: 50 mg/dL — AB
Hgb urine dipstick: NEGATIVE
Ketones, ur: NEGATIVE mg/dL
Leukocytes,Ua: NEGATIVE
Nitrite: NEGATIVE
Protein, ur: 100 mg/dL — AB
Specific Gravity, Urine: 1.032 — ABNORMAL HIGH (ref 1.005–1.030)
pH: 5 (ref 5.0–8.0)

## 2023-09-20 LAB — MAGNESIUM: Magnesium: 2 mg/dL (ref 1.7–2.4)

## 2023-09-20 MED ORDER — LORAZEPAM 0.5 MG PO TABS: 0.5000 mg | ORAL_TABLET | Freq: Three times a day (TID) | ORAL | Status: DC | PRN

## 2023-09-20 MED ORDER — LEVETIRACETAM 500 MG PO TABS
500.0000 mg | ORAL_TABLET | Freq: Two times a day (BID) | ORAL | Status: DC
  Administered 2023-09-20 – 2023-09-21 (×3): 500 mg via ORAL
  Filled 2023-09-20 (×3): qty 1

## 2023-09-20 MED ORDER — PANTOPRAZOLE SODIUM 40 MG PO TBEC
80.0000 mg | DELAYED_RELEASE_TABLET | Freq: Every day | ORAL | Status: DC
  Administered 2023-09-20 – 2023-09-21 (×2): 80 mg via ORAL
  Filled 2023-09-20 (×2): qty 2

## 2023-09-20 MED ORDER — ASPIRIN 81 MG PO CHEW
81.0000 mg | CHEWABLE_TABLET | Freq: Every day | ORAL | Status: DC
  Administered 2023-09-20 – 2023-09-21 (×2): 81 mg via ORAL
  Filled 2023-09-20 (×2): qty 1

## 2023-09-20 MED ORDER — LEVOTHYROXINE SODIUM 50 MCG PO TABS
50.0000 ug | ORAL_TABLET | Freq: Every day | ORAL | Status: DC
  Administered 2023-09-21: 50 ug via ORAL
  Filled 2023-09-20: qty 1

## 2023-09-20 MED ORDER — ATORVASTATIN CALCIUM 10 MG PO TABS
20.0000 mg | ORAL_TABLET | Freq: Every day | ORAL | Status: DC
  Administered 2023-09-20: 20 mg via ORAL
  Filled 2023-09-20: qty 2

## 2023-09-20 MED ORDER — FLUOXETINE HCL 20 MG PO CAPS
40.0000 mg | ORAL_CAPSULE | Freq: Every day | ORAL | Status: DC
  Administered 2023-09-20 – 2023-09-21 (×2): 40 mg via ORAL
  Filled 2023-09-20 (×2): qty 2

## 2023-09-20 MED ORDER — DOCUSATE SODIUM 100 MG PO CAPS
100.0000 mg | ORAL_CAPSULE | Freq: Every day | ORAL | Status: DC
  Administered 2023-09-20 – 2023-09-21 (×2): 100 mg via ORAL
  Filled 2023-09-20 (×2): qty 1

## 2023-09-20 MED ORDER — CLOZAPINE 100 MG PO TABS: 300.0000 mg | ORAL_TABLET | Freq: Two times a day (BID) | ORAL | Status: DC

## 2023-09-20 NOTE — ED Notes (Signed)
This RN tried to call pt mother but with no success, Tried to call group home as per staff I need to call her back after 10 minutes for answer.

## 2023-09-20 NOTE — Consult Note (Signed)
[facility] ED ASSESSMENT   Reason for Consult:  Psychiatry evaluation Referring Physician:  ER Physician Patient Identification: Brady Hoffman MRN:  [id number] ED Chief Complaint: Schizoaffective disorder, depressive type (HCC)  Diagnosis:  Principal Problem:   Schizoaffective disorder, depressive type (HCC) Active Problems:   Hallucinations   ED Assessment Time Calculation: Start Time: 1225 Stop Time: 1257 Total Time in Minutes (Assessment Completion): 32   Subjective:   Brady Hoffman is a 57 y.o. male patient admitted with previous hx of Schizoaffective disorder, seizure disorder, developmental delay and Alcohol abuse  was brought to the ER by his mother after a fall.  Patient has long hx of Chronic Mental illness but with limited Inpatient Psychiatry hospitalization due to adequate outpatient Mental health care.  HPI:  Patient was seen awake, alert and oriented x4.  Patient engaged fully well in the evaluation.  Patient states that he has been fall at the group home where he stays since March and nobody knows why.  He also states he has been dealing with anxiety for a while and have tried Medications but cannot feel better.  This patient is on Clozaril 300 mg twice a day also takes Keppra 500 mg twice a day.  He states his gait started getting unsteady and that he does not know why.  Patient was seen in the stretcher lying down with both arms shaking.  Patient had blood work on arrival which seems to be WNL although we need Clozaril and Keepra levels and same are ordered.  He had CT Head done which came normal.  Patient states he used to use much alcohol but is no longer drinking.  He reports that until he started falling he used to walk about and took care of all of his ADLS.   For his anxiety, his Prozac is decreased to 40 mg daily instead of twice a day.  Patient reports good sleep and appetite. Provider called Roena Malady, group home supervisor who stated that patient has been with the  group home for almost 20 years and that the fall they are witnessing is new in the past 4-6 months.  She states patient gets very anxious and Medications tried could not control the anxiety.  Patient has been on trial of Ativan for anxiety and he only gets relief for an hour and he gets anxious again. DR Jannifer Franklin who has been this patient's Psychiatrist states that he has been taking care of this patient since 2013 and that he has never seen the symptoms patient is presenting now.  He also added that patient has been stable for a while and avoided inpatient Hospitalization.  He starongly believe that patient need to be inpatient Psychiatry for medication adjustment and further evaluation.  We will seek bed placement while we have resumed his Home Medications.  Clozaril and Marthann Schiller level are also ordered.  Patient denies SI/HI/AVH.   Past Psychiatric History: hx of Schizoaffective disorder, seizure disorder, developmental delay and Alcohol abuse.  DR Jannifer Franklin. Limited inpatient Hospitalization noted as he has been stable in outpatient setting.  Risk to Self or Others: Is the patient at risk to self? No Has the patient been a risk to self in the past 6 months? No Has the patient been a risk to self within the distant past? No Is the patient a risk to others? No Has the patient been a risk to others in the past 6 months? No Has the patient been a risk to others within the distant past? 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 Social History:  Social History   Substance and Sexual Activity  Alcohol Use No     Social History   Substance and Sexual Activity  Drug Use No    Social History   Socioeconomic History   Marital status: Single    Spouse name: Not on file   Number of children: 0   Years of education: College   Highest education level: Not on file  Occupational History    Employer: OTHER  Tobacco Use   Smoking status: Never   Smokeless tobacco: Never  Substance and Sexual Activity   Alcohol use: No   Drug use: No   Sexual activity: Never  Other Topics Concern   Not on file  Social History Narrative   Patient lives in [facility].   Right handed.   Education  Two years of college.Caffeine Use: 2 cups daily  Coke cola.    Social Determinants of Health   Financial Resource Strain: Not on file  Food Insecurity: Not on file  Transportation Needs: Not on file  Physical Activity: Not on file  Stress: Not on file  Social Connections: Not on file   Additional Social History:    Allergies:   Allergies  Allergen Reactions   Risperidone And Related Other (See Comments)    Pt. States head feels like a rock    Labs:  Results for orders placed or performed during the hospital encounter of 09/19/23 (from the past 48 hour(s))  CBC with Differential     Status: Abnormal   Collection Time: 09/19/23 10:59 PM  Result Value Ref Range   WBC 7.5 4.0 - 10.5 K/uL   RBC 3.73 (L) 4.22 - 5.81 MIL/uL   Hemoglobin 11.9 (L) 13.0 - 17.0 g/dL   HCT 16.1 (L) 09.6 - 04.5 %   MCV 94.1 80.0 - 100.0 fL   MCH  31.9 26.0 - 34.0 pg   MCHC 33.9 30.0 - 36.0 g/dL   RDW 40.9 81.1 - 91.4 %   Platelets 122 (L) 150 - 400 K/uL   nRBC 0.0 0.0 - 0.2 %   Neutrophils Relative % 70 %   Neutro Abs 5.3 1.7 - 7.7 K/uL   Lymphocytes Relative 17 %   Lymphs Abs 1.3 0.7 - 4.0 K/uL   Monocytes Relative 12 %   Monocytes Absolute 0.9 0.1 - 1.0 K/uL   Eosinophils Relative 1 %   Eosinophils Absolute 0.1  0.0 - 0.5 K/uL   Basophils Relative 0 %   Basophils Absolute 0.0 0.0 - 0.1 K/uL   Immature Granulocytes 0 %   Abs Immature Granulocytes 0.01 0.00 - 0.07 K/uL    Comment: Performed at [facility], [address] [place], [place] [address]  Comprehensive metabolic panel     Status: Abnormal   Collection Time: 09/19/23 10:59 PM  Result Value Ref Range   Sodium 142 135 - 145 mmol/L   Potassium 3.6 3.5 - 5.1 mmol/L   Chloride 110 98 - 111 mmol/L   CO2 24 22 - 32 mmol/L   Glucose, Bld 131 (H) 70 - 99 mg/dL    Comment: Glucose reference range applies only to samples taken after fasting for at least 8 hours.   BUN 24 (H) 6 - 20 mg/dL   Creatinine, Ser 2.13 (H) 0.61 - 1.24 mg/dL   Calcium 8.3 (L) 8.9 - 10.3 mg/dL   Total Protein 7.1 6.5 - 8.1 g/dL   Albumin 3.8 3.5 - 5.0 g/dL   AST 21 15 - 41 U/L   ALT 19 0 - 44 U/L   Alkaline Phosphatase 80 38 - 126 U/L   Total Bilirubin 0.8 0.3 - 1.2 mg/dL   GFR, Estimated 53 (L) >60 mL/min    Comment: (NOTE) Calculated using the CKD-EPI Creatinine Equation (2021)    Anion gap 8 5 - 15    Comment: Performed at [facility], [address] [place], [place] [address]  Ethanol     Status: None   Collection Time: 09/19/23 10:59 PM  Result Value Ref Range   Alcohol, Ethyl (B) <10 <10 mg/dL    Comment: (NOTE) Lowest detectable limit for serum alcohol is 10 mg/dL.  For medical purposes only. Performed at [facility], [address] [place], [place] [address]   Magnesium     Status: None   Collection Time: 09/19/23  10:59 PM  Result Value Ref Range   Magnesium 2.0 1.7 - 2.4 mg/dL    Comment: Performed at [facility], [address] [place], [place] [address]  Rapid urine drug screen (hospital performed)     Status: Abnormal   Collection Time: 09/20/23  3:05 AM  Result Value Ref Range   Opiates NONE DETECTED NONE DETECTED   Cocaine NONE DETECTED NONE DETECTED   Benzodiazepines POSITIVE (A) NONE DETECTED   Amphetamines NONE DETECTED NONE DETECTED   Tetrahydrocannabinol NONE DETECTED NONE DETECTED   Barbiturates NONE DETECTED NONE DETECTED    Comment: (NOTE) DRUG SCREEN FOR MEDICAL PURPOSES ONLY.  IF CONFIRMATION IS NEEDED FOR ANY PURPOSE, NOTIFY LAB WITHIN 5 DAYS.  LOWEST DETECTABLE LIMITS FOR URINE DRUG SCREEN Drug Class                     Cutoff (ng/mL) Amphetamine and metabolites    1000 Barbiturate and metabolites    200 Benzodiazepine                 200 Opiates and metabolites        300 Cocaine and metabolites        300 THC                            50 Performed at [facility], [address], [place], [place] [address]   Urinalysis, w/ Reflex to Culture (Infection Suspected) -Urine, Clean Catch  Status: Abnormal   Collection Time: 09/20/23  3:05 AM  Result Value Ref Range   Specimen Source URINE, CLEAN CATCH    Color, Urine YELLOW YELLOW   APPearance CLEAR CLEAR   Specific Gravity, Urine 1.032 (H) 1.005 - 1.030   pH 5.0 5.0 - 8.0   Glucose, UA 50 (A) NEGATIVE mg/dL   Hgb urine dipstick NEGATIVE NEGATIVE   Bilirubin Urine NEGATIVE NEGATIVE   Ketones, ur NEGATIVE NEGATIVE mg/dL   Protein, ur 573 (A) NEGATIVE mg/dL   Nitrite NEGATIVE NEGATIVE   Leukocytes,Ua NEGATIVE NEGATIVE   RBC / HPF 0-5 0 - 5 RBC/hpf   WBC, UA 0-5 0 - 5 WBC/hpf    Comment:        Reflex urine culture not performed if WBC <=10, OR if Squamous epithelial cells >5. If Squamous epithelial cells >5 suggest recollection.    Bacteria, UA NONE SEEN NONE SEEN    Squamous Epithelial / HPF 0-5 0 - 5 /HPF   Mucus PRESENT     Comment: Performed at [facility], [address] [place], [place] [address]    Current Facility-Administered Medications  Medication Dose Route Frequency Provider Last Rate Last Admin   aspirin chewable tablet 81 mg  81 mg Oral Daily Dahlia Byes C, NP   81 mg at 09/20/23 1218   atorvastatin (LIPITOR) tablet 20 mg  20 mg Oral QHS Earney Navy, NP       [START ON 09/23/2023] cloZAPine (CLOZARIL) tablet 300 mg  300 mg Oral BID Dahlia Byes C, NP       docusate sodium (COLACE) capsule 100 mg  100 mg Oral Daily Welford Roche, Celesta Funderburk C, NP   100 mg at 09/20/23 1218   FLUoxetine (PROZAC) capsule 40 mg  40 mg Oral Daily Dahlia Byes C, NP       levETIRAcetam (KEPPRA) tablet 500 mg  500 mg Oral BID Dahlia Byes C, NP   500 mg at 09/20/23 1218   [START ON 09/21/2023] levothyroxine (SYNTHROID) tablet 50 mcg  50 mcg Oral Q0600 Dahlia Byes C, NP       LORazepam (ATIVAN) tablet 0.5 mg  0.5 mg Oral Q8H PRN Dahlia Byes C, NP       pantoprazole (PROTONIX) EC tablet 80 mg  80 mg Oral Daily Dahlia Byes C, NP   80 mg at 09/20/23 1218   Current Outpatient Medications  Medication Sig Dispense Refill   acetaminophen (TYLENOL) 500 MG tablet Take 500-1,000 mg by mouth every 4 (four) hours as needed (pain).     aspirin 81 MG chewable tablet Chew 1 tablet (81 mg total) by mouth daily.     atorvastatin (LIPITOR) 20 MG tablet Take 1 tablet (20 mg total) by mouth at bedtime. 30 tablet 1   cloZAPine (CLOZARIL) 100 MG tablet Take 1 tablet (100 mg total) by mouth 2 (two) times daily. (Patient taking differently: Take 300 mg by mouth See admin instructions. Take 300mg  in the morning and 300mg  at bedtime) 60 tablet 0   docusate sodium (COLACE) 100 MG capsule Take 100 mg by mouth daily.      FLUoxetine (PROZAC) 40 MG capsule Take 40 mg by mouth 2 (two) times daily.     folic acid (FOLVITE) 400 MCG tablet  Take 1,000 mcg by mouth daily.     levETIRAcetam (KEPPRA) 500 MG tablet Take 500 mg by mouth 2 (two) times daily.     levothyroxine (SYNTHROID, LEVOTHROID) 50 MCG tablet Take 1 tablet (  50 mcg total) by mouth daily before breakfast. For underactive thyroid 30 tablet 0   LORazepam (ATIVAN) 1 MG tablet Take 1 mg by mouth 2 (two) times daily as needed.     omeprazole (PRILOSEC) 40 MG capsule Take 1 capsule (40 mg total) by mouth daily. 30 capsule 1   clonazePAM (KLONOPIN) 0.5 MG tablet Take 1 tablet (0.5 mg total) by mouth at bedtime. (Patient not taking: Reported on 09/20/2023) 30 tablet 0   QUEtiapine (SEROQUEL) 100 MG tablet Take 100 mg by mouth at bedtime. (Patient not taking: Reported on 09/20/2023)      Musculoskeletal: Strength & Muscle Tone:  in stretcher lying down Gait & Station:  in stretcher lying down Patient leans:  see above.   Psychiatric Specialty Exam: Presentation  General Appearance:  Casual  Eye Contact: Good  Speech: Clear and Coherent; Slow  Speech Volume: Decreased  Handedness: Right   Mood and Affect  Mood: Anxious  Affect: Congruent   Thought Process  Thought Processes: Coherent  Descriptions of Associations:Intact  Orientation:Partial  Thought Content:No data recorded History of Schizophrenia/Schizoaffective disorder:No data recorded Duration of Psychotic Symptoms:No data recorded Hallucinations:No data recorded Ideas of Reference:None  Suicidal Thoughts:Suicidal Thoughts: No  Homicidal Thoughts:Homicidal Thoughts: No   Sensorium  Memory: Immediate Good; Recent Good; Remote Good  Judgment: Fair  Insight: Good   Executive Functions  Concentration: Good  Attention Span: Good  Recall: Good  Fund of Knowledge: Good  Language: Good   Psychomotor Activity  Psychomotor Activity: Psychomotor Activity: Tremor   Assets  Assets: Communication Skills; Housing; Desire for Improvement; Social Support    Sleep   Sleep: Sleep: Fair   Physical Exam: Physical Exam Vitals and nursing note reviewed.  Constitutional:      Appearance: Normal appearance. He is obese.  HENT:     Nose: Nose normal.  Cardiovascular:     Rate and Rhythm: Tachycardia present.  Pulmonary:     Effort: Pulmonary effort is normal.  Musculoskeletal:     Comments: Unsteady gait.  Skin:    General: Skin is dry.  Neurological:     Mental Status: He is alert and oriented to person, place, and time.  Psychiatric:        Attention and Perception: Attention and perception normal.        Mood and Affect: Mood is anxious.        Speech: Speech normal.        Behavior: Behavior normal. Behavior is cooperative.        Thought Content: Thought content normal.    Review of Systems  Constitutional: Negative.   HENT: Negative.    Eyes: Negative.   Respiratory: Negative.    Cardiovascular: Negative.   Gastrointestinal: Negative.   Genitourinary: Negative.   Musculoskeletal:        Recent falls, unsteady gaits.  We will obtain Clozaril level to r/o medication induced falls.  Patient is on high dose Clozaril.  Skin: Negative.   Neurological:  Positive for seizures.  Endo/Heme/Allergies: Negative.   Psychiatric/Behavioral:  Positive for depression. The patient is nervous/anxious.    Blood pressure 115/66, pulse (!) 115, temperature 98 F (36.7 C), temperature source Oral, resp. rate 18, height 6\' 3"  (1.905 m), weight 97.5 kg, SpO2 98%. Body mass index is 26.87 kg/m.  Medical Decision Making: Patient meets criteria for inpatient Psychiatry hospitalization for Medication adjustment and monitoring.  With high dose Clozaril we need level to r/o medication induced falls and also r/o side effects  related to high dose.  We will fax out records to facilities with available bed.    Problem 1: Schizoaffective disorder, Depressed type  Problem 2: Hallucination  Disposition:  Admit, seek placement  Earney Navy,  NP-PMHNP-BC 09/20/2023 1:21 PM

## 2023-09-20 NOTE — ED Notes (Signed)
Pt has been changed into burgundy scrubs. Pt has 2 belongings bags, 1 bag with clothes and a belt and 1bag containing boots.

## 2023-09-20 NOTE — ED Notes (Signed)
Pt was able to stand up on the side of the bed, unable to urinate, requesting water to drink

## 2023-09-20 NOTE — ED Notes (Signed)
Assessed pt anxiety level,  pt reports increasing anxiety but refused anxiety meds

## 2023-09-20 NOTE — ED Notes (Signed)
Pt group home administrator called back and stated that the pt had seen his psychiatrist yesterday and he is "needs to be hospitalized and not stable" to go back to the facility and needs his medications adjusted. PA lisa notified of the same, can call Geralynn Rile who is administrator at facility at [phone] for collateral information.

## 2023-09-20 NOTE — ED Notes (Signed)
Pt stood up on side of bed to urinate. Specimen sent. Pt given additional drink

## 2023-09-20 NOTE — Discharge Instructions (Addendum)
Labs and imaging today are reassuring.  No acute findings to explain frequent falls. Recommend to continue medications and follow-up closely with PCP. Return here for new concerns.

## 2023-09-20 NOTE — ED Notes (Signed)
Pt reports he is unable to provide urine specimen at this time. Pt informed this is all that is needed to complete his assessment tonight, and asked to inform staff as soon as he is able to provide urine.

## 2023-09-20 NOTE — ED Notes (Signed)
This RN followed up transport from the facility. As per Cline Crock from the facility someone will come and pick the pt in the next hour.

## 2023-09-20 NOTE — ED Notes (Signed)
As per staff of the group home, she's stated that she's waiting for the owner to call her back.

## 2023-09-21 ENCOUNTER — Encounter (HOSPITAL_COMMUNITY): Payer: Self-pay | Admitting: Nurse Practitioner

## 2023-09-21 ENCOUNTER — Other Ambulatory Visit: Payer: Self-pay

## 2023-09-21 ENCOUNTER — Inpatient Hospital Stay (HOSPITAL_COMMUNITY)
Admission: AD | Admit: 2023-09-21 | Discharge: 2023-10-16 | DRG: 885 | Disposition: A | Discharge status: Home / Self Care | Payer: Medicare Other | Source: Intra-hospital | Attending: Psychiatry | Admitting: Psychiatry

## 2023-09-21 DIAGNOSIS — F1010 Alcohol abuse, uncomplicated: Secondary | ICD-10-CM | POA: Diagnosis present

## 2023-09-21 DIAGNOSIS — F411 Generalized anxiety disorder: Secondary | ICD-10-CM | POA: Diagnosis present

## 2023-09-21 DIAGNOSIS — Z79899 Other long term (current) drug therapy: Secondary | ICD-10-CM

## 2023-09-21 DIAGNOSIS — G40909 Epilepsy, unspecified, not intractable, without status epilepticus: Secondary | ICD-10-CM | POA: Diagnosis present

## 2023-09-21 DIAGNOSIS — F251 Schizoaffective disorder, depressive type: Principal | ICD-10-CM | POA: Diagnosis present

## 2023-09-21 DIAGNOSIS — Z560 Unemployment, unspecified: Secondary | ICD-10-CM | POA: Diagnosis not present

## 2023-09-21 DIAGNOSIS — Z888 Allergy status to other drugs, medicaments and biological substances status: Secondary | ICD-10-CM | POA: Diagnosis not present

## 2023-09-21 DIAGNOSIS — I951 Orthostatic hypotension: Secondary | ICD-10-CM | POA: Diagnosis not present

## 2023-09-21 DIAGNOSIS — F429 Obsessive-compulsive disorder, unspecified: Secondary | ICD-10-CM | POA: Diagnosis present

## 2023-09-21 DIAGNOSIS — E785 Hyperlipidemia, unspecified: Secondary | ICD-10-CM | POA: Diagnosis present

## 2023-09-21 DIAGNOSIS — Z79890 Hormone replacement therapy (postmenopausal): Secondary | ICD-10-CM | POA: Diagnosis not present

## 2023-09-21 DIAGNOSIS — G252 Other specified forms of tremor: Secondary | ICD-10-CM | POA: Diagnosis present

## 2023-09-21 DIAGNOSIS — R6250 Unspecified lack of expected normal physiological development in childhood: Secondary | ICD-10-CM | POA: Diagnosis present

## 2023-09-21 DIAGNOSIS — F4001 Agoraphobia with panic disorder: Secondary | ICD-10-CM | POA: Diagnosis present

## 2023-09-21 DIAGNOSIS — F849 Pervasive developmental disorder, unspecified: Secondary | ICD-10-CM | POA: Diagnosis present

## 2023-09-21 DIAGNOSIS — R296 Repeated falls: Secondary | ICD-10-CM | POA: Diagnosis present

## 2023-09-21 DIAGNOSIS — Z7982 Long term (current) use of aspirin: Secondary | ICD-10-CM

## 2023-09-21 DIAGNOSIS — F061 Catatonic disorder due to known physiological condition: Secondary | ICD-10-CM | POA: Diagnosis not present

## 2023-09-21 DIAGNOSIS — K117 Disturbances of salivary secretion: Secondary | ICD-10-CM | POA: Diagnosis present

## 2023-09-21 DIAGNOSIS — N189 Chronic kidney disease, unspecified: Secondary | ICD-10-CM | POA: Diagnosis present

## 2023-09-21 LAB — TSH: TSH: 3.267 u[IU]/mL (ref 0.350–4.500)

## 2023-09-21 MED ORDER — DIPHENHYDRAMINE HCL 25 MG PO CAPS
50.0000 mg | ORAL_CAPSULE | Freq: Three times a day (TID) | ORAL | Status: DC | PRN
  Administered 2023-09-27: 50 mg via ORAL
  Filled 2023-09-21: qty 2

## 2023-09-21 MED ORDER — PANTOPRAZOLE SODIUM 40 MG PO TBEC
80.0000 mg | DELAYED_RELEASE_TABLET | Freq: Every day | ORAL | Status: DC
  Administered 2023-09-22 – 2023-10-16 (×25): 80 mg via ORAL
  Filled 2023-09-21 (×27): qty 2

## 2023-09-21 MED ORDER — DOCUSATE SODIUM 100 MG PO CAPS
100.0000 mg | ORAL_CAPSULE | Freq: Every day | ORAL | Status: DC
  Administered 2023-09-22 – 2023-10-16 (×25): 100 mg via ORAL
  Filled 2023-09-21 (×28): qty 1

## 2023-09-21 MED ORDER — ATORVASTATIN CALCIUM 20 MG PO TABS
20.0000 mg | ORAL_TABLET | Freq: Every day | ORAL | Status: DC
  Administered 2023-09-21 – 2023-10-15 (×25): 20 mg via ORAL
  Filled 2023-09-21: qty 2
  Filled 2023-09-21 (×13): qty 1
  Filled 2023-09-21: qty 2
  Filled 2023-09-21 (×14): qty 1

## 2023-09-21 MED ORDER — INFLUENZA VIRUS VACC SPLIT PF (FLUZONE) 0.5 ML IM SUSY
0.5000 mL | PREFILLED_SYRINGE | INTRAMUSCULAR | Status: DC
  Filled 2023-09-21: qty 0.5

## 2023-09-21 MED ORDER — HALOPERIDOL LACTATE 5 MG/ML IJ SOLN: 5.0000 mg | Freq: Three times a day (TID) | INTRAMUSCULAR | Status: DC | PRN

## 2023-09-21 MED ORDER — LORAZEPAM 1 MG PO TABS
2.0000 mg | ORAL_TABLET | Freq: Three times a day (TID) | ORAL | Status: DC | PRN
  Administered 2023-09-27: 2 mg via ORAL
  Filled 2023-09-21: qty 2

## 2023-09-21 MED ORDER — ALUM & MAG HYDROXIDE-SIMETH 200-200-20 MG/5ML PO SUSP: 30.0000 mL | ORAL | Status: DC | PRN

## 2023-09-21 MED ORDER — LEVETIRACETAM 500 MG PO TABS
500.0000 mg | ORAL_TABLET | Freq: Two times a day (BID) | ORAL | Status: DC
Start: 2023-09-21 — End: 2023-10-16
  Administered 2023-09-21 – 2023-10-16 (×50): 500 mg via ORAL
  Filled 2023-09-21 (×55): qty 1

## 2023-09-21 MED ORDER — MAGNESIUM HYDROXIDE 400 MG/5ML PO SUSP: 30.0000 mL | Freq: Every day | ORAL | Status: DC | PRN

## 2023-09-21 MED ORDER — FLUOXETINE HCL 20 MG PO CAPS
40.0000 mg | ORAL_CAPSULE | Freq: Every day | ORAL | Status: DC
  Administered 2023-09-22 – 2023-09-25 (×4): 40 mg via ORAL
  Filled 2023-09-21 (×6): qty 2

## 2023-09-21 MED ORDER — DIPHENHYDRAMINE HCL 50 MG/ML IJ SOLN
50.0000 mg | Freq: Three times a day (TID) | INTRAMUSCULAR | Status: DC | PRN
  Administered 2023-10-01 – 2023-10-13 (×7): 50 mg via INTRAMUSCULAR
  Filled 2023-09-21 (×7): qty 1

## 2023-09-21 MED ORDER — HALOPERIDOL 5 MG PO TABS
5.0000 mg | ORAL_TABLET | Freq: Three times a day (TID) | ORAL | Status: DC | PRN
  Administered 2023-09-27: 5 mg via ORAL
  Filled 2023-09-21: qty 1

## 2023-09-21 MED ORDER — LORAZEPAM 2 MG/ML IJ SOLN
2.0000 mg | Freq: Three times a day (TID) | INTRAMUSCULAR | Status: DC | PRN
Start: 2023-09-21 — End: 2023-10-14
  Administered 2023-10-01 – 2023-10-13 (×7): 2 mg via INTRAMUSCULAR
  Filled 2023-09-21 (×7): qty 1

## 2023-09-21 MED ORDER — LEVOTHYROXINE SODIUM 50 MCG PO TABS
50.0000 ug | ORAL_TABLET | Freq: Every day | ORAL | Status: DC
  Administered 2023-09-22 – 2023-10-16 (×25): 50 ug via ORAL
  Filled 2023-09-21 (×27): qty 1
  Filled 2023-09-21: qty 2

## 2023-09-21 MED ORDER — LORAZEPAM 0.5 MG PO TABS
0.5000 mg | ORAL_TABLET | Freq: Three times a day (TID) | ORAL | Status: DC | PRN
  Administered 2023-09-21 – 2023-10-16 (×5): 0.5 mg via ORAL
  Filled 2023-09-21 (×5): qty 1

## 2023-09-21 MED ORDER — ACETAMINOPHEN 325 MG PO TABS
650.0000 mg | ORAL_TABLET | Freq: Four times a day (QID) | ORAL | Status: DC | PRN
  Administered 2023-10-01: 650 mg via ORAL
  Filled 2023-09-21: qty 2

## 2023-09-21 MED ORDER — ASPIRIN 81 MG PO CHEW
81.0000 mg | CHEWABLE_TABLET | Freq: Every day | ORAL | Status: DC
  Administered 2023-09-22 – 2023-10-16 (×25): 81 mg via ORAL
  Filled 2023-09-21 (×28): qty 1

## 2023-09-21 MED ORDER — CLOZAPINE 100 MG PO TABS
300.0000 mg | ORAL_TABLET | Freq: Two times a day (BID) | ORAL | Status: DC
  Filled 2023-09-21 (×3): qty 3

## 2023-09-21 NOTE — ED Provider Notes (Addendum)
Accepted to behavioral health for admission.  Paperwork completed.  It is [facility].  No EMTALA needed.   Vanetta Mulders, MD 09/21/23 1505    Vanetta Mulders, MD 09/21/23 [phone]

## 2023-09-21 NOTE — Progress Notes (Signed)
[facility] LCSW Progress Note   09/21/2023    2:36 PM  Brady Hoffman   [id number]   Type of Contact and Topic:  Psychiatric Bed Placement   Pt accepted to [facility] 504-01   Patient meets inpatient criteria per Katherine Mantle, NP    The attending provider will be Dr. Sherron Flemings  Call report to [phone]    Carleene Overlie, Paramedic @ [facility] ED notified.     Pt scheduled  to arrive at [facility] TODAY.   Damita Dunnings, MSW, LCSW-A  2:37 PM 09/21/2023

## 2023-09-21 NOTE — ED Notes (Signed)
Have tried calling report twice to [facility]  can't get anyone to answer phone    [phone]

## 2023-09-21 NOTE — Progress Notes (Signed)
Admission Note:  Patient is a 57 y.o. male presenting voluntarily from [facility] due to frequent falls recently at the group home where he resides. Additionally he has been exhibiting anxiety and repetitive behaviors. Per patient's mother, his medications need to be assessed and possibly changed. Patient's UDS was positive for benzodiazepines.  On admission patient denies SI, HI and AVH. Patient endorses "a little" anxiety. Patient is flat and forwards little, calm and cooperative. Patient has a slow, shuffling, careful gait and declined the use of a walker. Skin assessment was performed with Mia, MHT and was WNL with the exception of toenails separating from nail bed, and brown discoloration. Patient was provided packet, forms were signed and patient was oriented to the unit. Q 15 minute safety checks were initiated.

## 2023-09-21 NOTE — Tx Team (Signed)
Initial Treatment Plan 09/21/2023 5:19 PM Brady Hoffman ZOX:[id number]    PATIENT STRESSORS: Medication change or noncompliance     PATIENT STRENGTHS: Supportive family/friends    PATIENT IDENTIFIED PROBLEMS: "I keep falling"  "I don't remember when I fell last"                   DISCHARGE CRITERIA:  Ability to meet basic life and health needs Motivation to continue treatment in a less acute level of care  PRELIMINARY DISCHARGE PLAN: Outpatient therapy Return to previous living arrangement  PATIENT/FAMILY INVOLVEMENT: This treatment plan has been presented to and reviewed with the patient, Brady Hoffman.  The patient and family have been given the opportunity to ask questions and make suggestions.  Karn Pickler, RN 09/21/2023, 5:19 PM

## 2023-09-21 NOTE — Group Note (Signed)
Date:  09/21/2023 Time:  8:28 PM  Group Topic/Focus:  Wrap-Up Group:   The focus of this group is to help patients review their daily goal of treatment and discuss progress on daily workbooks.    Participation Level:  Did Not Attend  Participation Quality:   Did Not Attend  Affect:   Did Not Attend  Cognitive:   Did Not Attend  Insight: None  Engagement in Group:   Did Not Attend  Modes of Intervention:   Did Not Attend  Additional Comments:  Pt was encouraged to attend wrap up group but did not attend.  Felipa Furnace 09/21/2023, 8:28 PM

## 2023-09-21 NOTE — ED Notes (Signed)
hey Brady Hoffman     this is all I have on this patient   nowhere in the notes is there any other information on this patient being transferred today    I have faxed over the voluntary form to [facility]   other than that I dont have anything else to go on until I get a reply from someone

## 2023-09-21 NOTE — Progress Notes (Signed)
   09/21/23 1700  Psych Admission Type (Psych Patients Only)  Admission Status Voluntary  Psychosocial Assessment  Patient Complaints Confusion (episodes of falling)  Eye Contact Brief  Facial Expression Blank  Affect Flat  Speech Soft  Interaction Childlike;Forwards little  Motor Activity Shuffling;Slow;Tremors  Appearance/Hygiene Disheveled;Body odor;Poor hygiene  Behavior Characteristics Cooperative;Calm  Mood Pleasant  Thought Process  Coherency Concrete thinking  Content WDL  Delusions None reported or observed  Perception WDL  Hallucination None reported or observed  Judgment Poor  Confusion Mild  Danger to Self  Current suicidal ideation? Denies  Danger to Others  Danger to Others None reported or observed

## 2023-09-21 NOTE — Plan of Care (Signed)
  Problem: Education: Goal: Knowledge of [facility] Education information/materials will improve Outcome: Progressing Goal: Emotional status will improve Outcome: Progressing Goal: Mental status will improve Outcome: Progressing Goal: Verbalization of understanding the information provided will improve Outcome: Progressing   Problem: Activity: Goal: Interest or engagement in activities will improve Outcome: Progressing   

## 2023-09-22 ENCOUNTER — Encounter (HOSPITAL_COMMUNITY): Payer: Self-pay

## 2023-09-22 DIAGNOSIS — F251 Schizoaffective disorder, depressive type: Secondary | ICD-10-CM | POA: Diagnosis not present

## 2023-09-22 MED ORDER — CLOZAPINE 100 MG PO TABS
300.0000 mg | ORAL_TABLET | Freq: Two times a day (BID) | ORAL | Status: DC
  Filled 2023-09-22 (×2): qty 3

## 2023-09-22 NOTE — Group Note (Signed)
Recreation Therapy Group Note   Group Topic:Coping Skills  Group Date: 09/22/2023 Start Time: 1015 End Time: 1045 Facilitators: Jakirah Zaun-McCall, LRT,CTRS Location: [address] Dayroom   Group Topic: Coping Skills   Goal Area(s) Addresses: Patient will define what a coping skill is. Patient will create a list of healthy coping skills beginning with each letter of the alphabet. Patient will successfully identify positive coping skills they can use post d/c.   Group Description: Coping A to Z. Patient asked to identify what a coping skill is and when they use them. Patients with Clinical research associate discussed healthy versus unhealthy coping skills. Next patients were given a blank worksheet titled "Coping Skills A-Z". Patients were instructed to come up with at least one positive coping skill per letter of the alphabet. Patients were given 15 minutes to brainstorm before ideas were presented to the large group. Patients and LRT debriefed on the importance of coping skill selection based on situation and back-up plans when a skill tried is not effective. At the end of group, patients were given an handout of alphabetized strategies to keep for future reference.   Education: Pharmacologist, Scientist, physiological, Discharge Planning.    Education Outcome: Acknowledges education/Verbalizes understanding/In group clarification offered/Additional education needed   Affect/Mood: N/A   Participation Level: Did not attend    Clinical Observations/Individualized Feedback:     Plan: Continue to engage patient in RT group sessions 2-3x/week.   Seraj Dunnam-McCall, LRT,CTRS 09/22/2023 11:41 AM

## 2023-09-22 NOTE — H&P (Signed)
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  After medical evaluation/stabilization & clearance, he was transferred to the [facility] for further psychiatric evaluation & treatments.   During this evaluation, [name] reports that he has mental illness that started 30 years ago.  He reports his last hospitalization was at [facility] 5 years ago, and he has maintained his mental health symptoms adequately with outpatient therapy with Dr. Jannifer Franklin.  Reports for the past 6 months he has been falling frequently due to occasional dizziness and sometimes no dizziness prior to falling.  Denies history of orthostatic blood pressure.  Reports occasionally he hits his head when he falls and recently injured his right arm.  Added, they did a CT of the head and chest in the hospital with all negative results.  Denies loss of consciousness or confusion during these fall episode.  Denies history of nausea, vomiting,diarrhea, numbness, weakness, or blurry vision.  Reports he has lived in the group home for the past 30 years, and prior to that he lived with his mom and sister.  Further reports he has been single all his life never married  and no children.  Evaluation: [name] is seen and examined in his room on [address] sitting up in his bed.  He is alert, calm, cooperative, and oriented to person, place, time, and situation.  Chart reviewed and findings shared with the treatment team and consult with attending psychiatrist.  Able to maintain fair eye contact with this provider, and speech clear and coherent with normal volume and pattern.  Observed with akathisia to both hands, which could have been caused by past antipsychotic medications for the past 10 years.  Thought content and thought process coherent and relevant.  Patient denies delusional thinking or paranoia.  He endorses auditory hallucination with the voices telling him to go to the bathroom.  Reports going to the bathroom at least 10 times a day.  Endorses history of OCD with symptoms of counting, and handwashing.  Denies history of constipation, last BM today.  He denies SI, HI, or VH.  Patient further denies history of mania, PTSD, or abuse.  Vital signs reviewed without critical values.  Labs and EKG reviewed as indicated in the treatment plan.  PT consult ordered for patient.  Patient is admitted for stabilization, medication management, and safety.  Mode of transport to Hospital: Safe transport Current Outpatient (Home) Medication List: See home medication listing PRN medication prior to evaluation: See home medication listing  ED course: Labs and EKG obtained and analyzed.  Patient was dispositioned to [facility] for further psychiatric evaluation and treatment Collateral Information: None obtained at this time POA/Legal Guardian:  Past Psychiatric Hx: Previous Psych Diagnoses: Panic disorder with agoraphobia, history of seizures, history of alcohol use disorder, schizoaffective disorder Prior inpatient treatment: Was admitted to [facility] 5 years ago Current/prior outpatient treatment: Yes with  Dr. Jannifer Franklin Prior rehab hx: Denies Psychotherapy hx: Yes History of suicide:  Denies History of homicide or aggression: Denies Psychiatric medication history: Patient has been on trial Seroquel, Prolixin, Effexor, and Haldol Psychiatric medication compliance history: Compliance Neuromodulation history: Denies Current Psychiatrist: Dr. Jannifer Franklin Current therapist: Dr. Jannifer Franklin  Substance Abuse Hx: Alcohol: Drinks 1-2 beers every 2 weeks Tobacco: Denies Illicit drugs: Denies Rx drug abuse: Denies Rehab hx: Denies  Past Medical History: Medical Diagnoses: Hyperlipidemia, anemia, encephalopathy, low back pain, history of seizures, history of acute renal failure. Home Rx: Yes Prior Hosp: Denies Prior Surgeries/Trauma: Denies Head trauma, LOC, concussions, seizures: History of seizures Allergies: Risperidone And Related  Other (See Comments) Not Specified  11/01/2011  Pt. 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 [employer]  Associated Signs/Symptoms: Depression Symptoms:  depressed mood, anhedonia, insomnia, psychomotor agitation, fatigue, feelings of worthlessness/guilt, difficulty concentrating, hopelessness, anxiety, panic attacks, loss of energy/fatigue, disturbed sleep,  (Hypo) Manic Symptoms:  Hallucinations,  Anxiety Symptoms:  Agoraphobia, Excessive Worry, Panic Symptoms, Obsessive Compulsive Symptoms:   Checking, Counting, Handwashing,, Social  Anxiety,  Psychotic Symptoms:  Hallucinations: Auditory  PTSD Symptoms: NA  Total Time spent with patient: 1 hour  Is the patient at risk to self? No.  Has the patient been a risk to self in the past 6 months? No.  Has the patient been a risk to self within the distant past? No.  Is the patient a risk to others? No.  Has the patient been a risk to others in the past 6 months? No.  Has the patient been a risk to others within the distant past? No.   [place] Scale:  Flowsheet Row [facility]) [facility] from [facility]19/2023 in [facility] Department at [facility]  C-SSRS RISK CATEGORY No Risk No Risk       Alcohol Screening: 1. How often do you have a drink containing alcohol?: Never 2. How many drinks containing alcohol do you have on a typical day when you are drinking?: 1 or 2 3. How often do you have six or more drinks on one occasion?: Never AUDIT-C Score: 0 4. How often during the last year have you found that you were not able to stop drinking once you had started?: Never 5. How often during the last year have you failed to do what was normally expected from you because of drinking?: Never 6. How often during the last year have you needed a first drink in the morning to get yourself going after a heavy drinking session?: Never 7. How often during the last year have you had a feeling of guilt of remorse after drinking?: Never 8. How often during the last year have you been unable to remember what happened the night before because you had been drinking?: Never 9. Have you or someone else been injured as a result of your drinking?: No 10. Has a relative or friend or a doctor or another health worker been concerned about your  drinking or suggested you cut down?: No Alcohol Use Disorder Identification Test Final Score (AUDIT): 0  Substance Abuse History in the last 12 months:  No.  Consequences of  Substance Abuse: Discussed with patient during this admission evaluation. Medical Consequences:  Liver damage, Possible death by overdose Legal Consequences:  Arrests, jail time, Loss of driving privilege. Family Consequences:  Family discord, divorce and or separation.  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  No value filed. Counseled patient on smoking cessation:  No value filed. Reason Tobacco Screening Not Completed: No value filed.    Social History:  Social History   Substance and Sexual Activity  Alcohol Use No     Social History   Substance and Sexual Activity  Drug Use No    Additional Social History:  Allergies:   Allergies  Allergen Reactions   Risperidone And Related Other (See Comments)    Pt. States head feels like a rock   Lab Results:  Results for orders placed or performed during the hospital encounter of 09/21/23 (from the past 48 hour(s))  TSH     Status: None   Collection Time: 09/21/23  7:00 PM  Result Value Ref Range   TSH 3.267 0.350 - 4.500 uIU/mL    Comment: Performed by a 3rd  Generation assay with a functional sensitivity of <=0.01 uIU/mL. Performed at [facility], [address] [place], [place] [address]    Blood Alcohol level:  Lab Results  Component Value Date   [facility] <10 09/19/2023   ETH <10 09/28/2019   Metabolic Disorder Labs:  Lab Results  Component Value Date   HGBA1C 5.6 02/25/2014   MPG 114 02/25/2014   No results found for: "PROLACTIN" Lab Results  Component Value Date   CHOL 144 07/03/2023   TRIG 152 (H) 07/03/2023   HDL 37 (L) 07/03/2023   CHOLHDL 3.9 07/03/2023   VLDL 28 10/20/2009   LDLCALC 82 07/03/2023   LDLCALC 59 10/02/2021   Current Medications: Current Facility-Administered Medications  Medication Dose Route Frequency Provider Last Rate Last Admin   acetaminophen (TYLENOL) tablet 650 mg  650 mg Oral Q6H PRN Dahlia Byes C, NP       alum & mag hydroxide-simeth (MAALOX/MYLANTA) 200-200-20 MG/5ML suspension 30 mL  30 mL Oral Q4H PRN Dahlia Byes C, NP       aspirin chewable tablet 81 mg  81 mg Oral Daily Onuoha, Josephine C, NP   81 mg at 09/22/23 0801   atorvastatin (LIPITOR) tablet 20 mg  20  mg Oral QHS Dahlia Byes C, NP   20 mg at 09/21/23 2131   [START ON 09/23/2023] cloZAPine (CLOZARIL) tablet 300 mg  300 mg Oral BID Dahlia Byes C, NP       diphenhydrAMINE (BENADRYL) capsule 50 mg  50 mg Oral TID PRN Earney Navy, NP       Or   diphenhydrAMINE (BENADRYL) injection 50 mg  50 mg Intramuscular TID PRN Earney Navy, NP       docusate sodium (COLACE) capsule 100 mg  100 mg Oral Daily Dahlia Byes C, NP   100 mg at 09/22/23 0801   FLUoxetine (PROZAC) capsule 40 mg  40 mg Oral Daily Dahlia Byes C, NP   40 mg at 09/22/23 0801   haloperidol (HALDOL) tablet 5 mg  5 mg Oral TID PRN Earney Navy, NP       Or   haloperidol lactate (HALDOL) injection 5 mg  5 mg Intramuscular TID PRN Dahlia Byes C, NP       influenza vac split trivalent PF (FLULAVAL)  injection 0.5 mL  0.5 mL Intramuscular Tomorrow-1000 [place]ill, Harrold Donath, MD       levETIRAcetam (KEPPRA) tablet 500 mg  500 mg Oral BID Dahlia Byes C, NP   500 mg at 09/22/23 0801   levothyroxine (SYNTHROID) tablet 50 mcg  50 mcg Oral Q0600 Dahlia Byes C, NP   50 mcg at 09/22/23 4098   LORazepam (ATIVAN) tablet 2 mg  2 mg Oral TID PRN Earney Navy, NP       Or   LORazepam (ATIVAN) injection 2 mg  2 mg Intramuscular TID PRN Earney Navy, NP       LORazepam (ATIVAN) tablet 0.5 mg  0.5 mg Oral Q8H PRN Dahlia Byes C, NP   0.5 mg at 09/21/23 2131   magnesium hydroxide (MILK OF MAGNESIA) suspension 30 mL  30 mL Oral Daily PRN Earney Navy, NP       pantoprazole (PROTONIX) EC tablet 80 mg  80 mg Oral Daily Dahlia Byes C, NP   80 mg at 09/22/23 0801   PTA Medications: Medications Prior to Admission  Medication Sig Dispense Refill Last Dose   acetaminophen (TYLENOL) 500 MG tablet Take 500-1,000 mg by mouth every 4 (four) hours as needed (pain).      aspirin 81 MG chewable tablet Chew 1 tablet (81 mg total) by mouth daily.      atorvastatin (LIPITOR) 20 MG tablet Take 1 tablet (20 mg total) by mouth at bedtime. 30 tablet 1    clonazePAM (KLONOPIN) 0.5 MG tablet Take 1 tablet (0.5 mg total) by mouth at bedtime. (Patient not taking: Reported on 09/20/2023) 30 tablet 0    cloZAPine (CLOZARIL) 100 MG tablet Take 1 tablet (100 mg total) by mouth 2 (two) times daily. (Patient taking differently: Take 300 mg by mouth See admin instructions. Take 300mg  in the morning and 300mg  at bedtime) 60 tablet 0    docusate sodium (COLACE) 100 MG capsule Take 100 mg by mouth daily.       FLUoxetine (PROZAC) 40 MG capsule Take 40 mg by mouth 2 (two) times daily.      folic acid (FOLVITE) 400 MCG tablet Take 1,000 mcg by mouth daily.      levETIRAcetam (KEPPRA) 500 MG tablet Take 500 mg by mouth 2 (two) times daily.      levothyroxine (SYNTHROID, LEVOTHROID) 50 MCG tablet Take 1  tablet (50 mcg total) by mouth daily  before breakfast. For underactive thyroid 30 tablet 0    LORazepam (ATIVAN) 1 MG tablet Take 1 mg by mouth 2 (two) times daily as needed.      omeprazole (PRILOSEC) 40 MG capsule Take 1 capsule (40 mg total) by mouth daily. 30 capsule 1    QUEtiapine (SEROQUEL) 100 MG tablet Take 100 mg by mouth at bedtime. (Patient not taking: Reported on 09/20/2023)      Musculoskeletal: Strength & Muscle Tone: abnormal Gait & Station: unsteady Patient leans: N/A  Psychiatric Specialty Exam:  Presentation  General Appearance:  Casual; Fairly Groomed  Eye Contact: Fair  Speech: Clear and Coherent  Speech Volume: Normal  Handedness: Right  Mood and Affect  Mood: Anxious; Depressed  Affect: Congruent  Thought Process  Thought Processes: Coherent  Duration of Psychotic Symptoms: 30 years ago Past Diagnosis of Schizophrenia or Psychoactive disorder: 30 years ago Descriptions of Associations:Intact  Orientation:Full (Time, Place and Person)  Thought Content:Logical  Hallucinations:Hallucinations: Auditory Description of Auditory Hallucinations: Hearing voices telling him to go to the bathroom  Ideas of Reference:None  Suicidal Thoughts:Suicidal Thoughts: No  Homicidal Thoughts:Homicidal Thoughts: No  Sensorium  Memory: Immediate Fair; Recent Fair  Judgment: Fair  Insight: Fair  Art therapist  Concentration: Fair  Attention Span: Good  Recall: Good  Fund of Knowledge: Fair  Language: Good  Psychomotor Activity  Psychomotor Activity:Psychomotor Activity: Mannerisms; Tremor Extrapyramidal Side Effects (EPS): Akathisia AIMS Completed?: No  Assets  Assets: Communication Skills; Desire for Improvement; Housing; Physical Health; Resilience; Social Support  Sleep  Sleep:Sleep: Good Number of Hours of Sleep: 10  Physical Exam: Physical Exam Vitals and nursing note reviewed.  HENT:     Head: Normocephalic.      Nose: Nose normal.     Mouth/Throat:     Mouth: Mucous membranes are moist.  Eyes:     Extraocular Movements: Extraocular movements intact.  Cardiovascular:     Rate and Rhythm: Tachycardia present.  Pulmonary:     Effort: Pulmonary effort is normal.  Abdominal:     Comments: Deferred  Genitourinary:    Comments: Deferred Musculoskeletal:     Cervical back: Normal range of motion.     Comments: History of Acathisia and frequent falls  Skin:    General: Skin is warm.  Neurological:     General: No focal deficit present.     Mental Status: He is alert and oriented to person, place, and time.     Coordination: Coordination abnormal.  Psychiatric:        Mood and Affect: Mood normal.        Behavior: Behavior normal.    Review of Systems  Constitutional:  Negative for chills and fever.  HENT:  Negative for sore throat.   Eyes:  Negative for blurred vision.  Respiratory:  Negative for cough, sputum production, shortness of breath and wheezing.   Cardiovascular:  Negative for chest pain and palpitations.  Gastrointestinal:  Negative for abdominal pain, diarrhea, heartburn, nausea and vomiting.  Genitourinary:  Negative for dysuria, frequency and urgency.  Musculoskeletal:  Positive for falls (History of frequent falls).       History of acathisia and frequent falls  Skin:  Negative for itching and rash.  Neurological:  Positive for seizures (History of seizures and on Keppra). Negative for dizziness, tingling, tremors, sensory change, speech change and headaches.  Endo/Heme/Allergies:        See allergy listing  Psychiatric/Behavioral:  Positive for depression. The patient is nervous/anxious.  Blood pressure 134/88, pulse (!) 109, temperature 97.7 F (36.5 C), temperature source Oral, resp. rate 18, height 6\' 3"  (1.905 m), weight 94.8 kg, SpO2 97%. Body mass index is 26.12 kg/m.  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 twice daily for psychosis Continue Prozac 40 mg p.o. daily for depression Continue Ativan tablet 0.5 mg p.o. every 8 hours as needed for anxiety  Medications for other medical problems: Continue aspirin chewable tablet 81 mg p.o. daily Continue Lipitor tablets 20 mg p.o. daily for hyperlipidemia Continue Colace capsule 100 mg p.o. daily for constipation Continue Keppra tablet 500 mg p.o. 2 times daily for seizures prevention Continue Synthroid tablet 50 mcg p.o. daily for hypothyroidism Continue Protonix EC tablet 80 mg p.o. daily for GERD  Agitation protocol: Benadryl capsule 50 mg p.o. or IM 3 times daily as needed agitation   Haldol tablets 5 mg po IM 3 times daily as needed agitation   Lorazepam tablet 2 mg p.o. or IM 3 times daily as needed agitation    Other PRN Medications -Acetaminophen 650 mg every 6 as needed/mild pain -Maalox 30 mL oral every 4 as needed/digestion -Magnesium hydroxide 30 mL daily as needed/mild constipation  -- The risks/benefits/side-effects/alternatives to this medication were discussed in detail with the patient and time was given for questions. The patient consents to medication trial.  -- Metabolic profile and EKG monitoring obtained while on an atypical antipsychotic (BMI: Lipid Panel: HbgA1c: QTc:)  -- Encouraged patient to participate in unit milieu and in scheduled group therapies   Labs reviewed: CMP: Glucose 131 high, BUN 24 high, creatinine 1.52 high, GFR estimated 53 low, calcium 8.3 low.  Lipid panel: HDL 37 low, triglyceride 152 hide.  Otherwise normal.  CBC with differential: RBC 3.73 low, hemoglobin 11.9 low, HCT 35.1 low, platelets 122  low.  Otherwise normal.  Absolute neutrophils 5.3 WNL.  UDS: Positive for benzodiazepine(prescribed).  Labs ordered: Hemoglobin A1c, TSH, vitamin D 25-hydroxy  EKG reviewed: NSR, ventricular rate 96, QT/QTc 400/505   Safety and Monitoring: Voluntary admission to inpatient psychiatric unit for safety, stabilization and treatment Daily contact with patient to assess and evaluate symptoms and progress in treatment Patient's case to be discussed in multi-disciplinary team meeting Observation Level : q15 minute checks Vital signs: q12 hours Precautions: suicide, but pt currently verbally contracts for safety on unit    Discharge Planning: Social work and case management to assist with discharge planning and identification of hospital follow-up needs prior to discharge Estimated LOS: 5-7 days Discharge Concerns: Need to establish a safety plan; Medication compliance and effectiveness Discharge Goals: Return home with outpatient referrals for mental health follow-up including medication management/psychotherapy.  Long Term Goal(s): Improvement in symptoms so as ready for discharge  Short Term Goals: Ability to identify changes in lifestyle to reduce recurrence of condition will improve, Ability to verbalize feelings will improve, Ability to disclose and discuss suicidal ideas, Ability to demonstrate self-control will improve, Ability to identify and develop effective coping behaviors will improve, Ability to maintain clinical measurements within normal limits will improve, Compliance with prescribed medications will improve, and Ability to identify triggers associated with substance abuse/mental health issues will improve  Physician Treatment Plan for Secondary Diagnosis: Principal Problem:  Schizoaffective disorder, depressive type (HCC)   I certify that inpatient services furnished can reasonably be expected to improve the patient's condition.    Cecilie Lowers, FNP 10/21/202412:09 PM

## 2023-09-22 NOTE — Progress Notes (Signed)
Recreation Therapy Notes  INPATIENT RECREATION THERAPY ASSESSMENT  Patient Details Name: Brady Hoffman MRN: [id number] DOB: 1966-10-10 Today's Date: 09/22/2023       Information Obtained From: Patient  Able to Participate in Assessment/Interview: Yes  Patient Presentation: Responsive, Alert  Reason for Admission (Per Patient): Other (Comments) ("falling")  Patient Stressors: Other (Comment) (None identified)  Coping Skills:   Isolation, TV, Sports, Exercise, Meditate, Deep Breathing, Music, Talk, Prayer, Avoidance  Leisure Interests (2+):  Individual - TV  Frequency of Recreation/Participation: Other (Comment) (Daily)  Awareness of Community Resources:  No  Expressed Interest in [employer] Information: No  County of Residence:  [place]  Patient Main Form of Transportation: Other (Comment) ("my mother")  Patient Strengths:  Patience  Patient Identified Areas of Improvement:  "going to bathroom"  Patient Goal for Hospitalization:  "get on right medicine"  Current SI (including self-harm):  No  Current HI:  No  Current AVH: No  Staff Intervention Plan: Group Attendance, Collaborate with Interdisciplinary Treatment Team  Consent to Intern Participation: N/A   Kelin Borum-McCall, LRT,CTRS Shawnique Mariotti A Erine Phenix-McCall 09/22/2023, 1:38 PM

## 2023-09-22 NOTE — Group Note (Signed)
Date:  09/22/2023 Time:  8:27 PM  Group Topic/Focus:  Wrap-Up Group:   The focus of this group is to help patients review their daily goal of treatment and discuss progress on daily workbooks.    Participation Level:  Did Not Attend  Participation Quality:   Did Not Attend  Affect:  Did Not Attend  Cognitive:  Did Not Attend  Insight: None  Engagement in Group:  Did Not Attend  Modes of Intervention:  Did Not Attend  Additional Comments:  Pt was encouraged to attend wrap up group but did not attend.  Felipa Furnace 09/22/2023, 8:27 PM

## 2023-09-22 NOTE — [facility] Suicide Risk Assessment (Signed)
Suicide Risk Assessment  Admission Assessment    [facility] Admission Suicide Risk Assessment   Nursing information obtained from:  Patient Demographic factors:  Male, Age 57 or older, Caucasian Current Mental Status:  NA Loss Factors:  NA Historical Factors:  NA Risk Reduction Factors:  Positive social support  Total Time spent with patient: 30 minutes  Principal Problem: Schizoaffective disorder, depressive type (HCC) Diagnosis:  Principal Problem:   Schizoaffective disorder, depressive type (HCC)  Subjective Data: Brady Hoffman is a 57 year old Caucasian male with prior psychiatric history significant for schizoaffective disorder, seizure disorder, developmental delay, alcohol use disorder, who presents voluntarily [facility] from [facility] at [facility] for evaluation for his mental health illness and frequent incidence of falls.  After medical evaluation/stabilization & clearance, he was transferred to the [facility] for further psychiatric evaluation & treatments.   Continued Clinical Symptoms:  Alcohol Use Disorder Identification Test Final Score (AUDIT): 0 The "Alcohol Use Disorders Identification Test", Guidelines for Use in Primary Care, Second Edition.  World Science writer [facility]). Score between 0-7:  no or low risk or alcohol related problems. Score between 8-15:  moderate risk of alcohol related problems. Score between 16-19:  high risk of alcohol related problems. Score 20 or above:  warrants further diagnostic evaluation for alcohol dependence and treatment.  CLINICAL FACTORS:   Severe Anxiety and/or Agitation Depression:   Anhedonia Comorbid alcohol abuse/dependence Hopelessness Insomnia Severe Schizophrenia:   Depressive state Epilepsy More than one psychiatric diagnosis Currently Psychotic Previous Psychiatric Diagnoses and Treatments Medical Diagnoses and Treatments/Surgeries   Musculoskeletal: Strength & Muscle Tone: within normal limits Gait & Station:  unsteady Patient leans: N/A  Psychiatric Specialty Exam:  Presentation  General Appearance:  Casual  Eye Contact: Good  Speech: Clear and Coherent; Slow  Speech Volume: Decreased  Handedness: Right  Mood and Affect  Mood: Anxious  Affect: Congruent  Thought Process  Thought Processes: Coherent  Descriptions of Associations:Intact  Orientation:Partial  Thought Content:No data recorded History of Schizophrenia/Schizoaffective disorder:No data recorded Duration of Psychotic Symptoms:No data recorded Hallucinations:No data recorded Ideas of Reference:None  Suicidal Thoughts:No data recorded Homicidal Thoughts:No data recorded  Sensorium  Memory: Immediate Good; Recent Good; Remote Good  Judgment: Fair  Insight: Good  Executive Functions  Concentration: Good  Attention Span: Good  Recall: Good  Fund of Knowledge: Good  Language: Good  Psychomotor Activity  Psychomotor Activity:No data recorded  Assets  Assets: Communication Skills; Housing; Desire for Improvement; Social Support  Sleep  Sleep:No data recorded  Physical Exam: Physical Exam Vitals and nursing note reviewed.  HENT:     Head: Normocephalic.     Nose: Nose normal.     Mouth/Throat:     Mouth: Mucous membranes are moist.  Eyes:     Extraocular Movements: Extraocular movements intact.  Cardiovascular:     Rate and Rhythm: Tachycardia present.  Pulmonary:     Effort: Pulmonary effort is normal.  Abdominal:     Comments: Deferred  Genitourinary:    Comments: Deferred Musculoskeletal:     Cervical back: Normal range of motion.     Comments: History of the Akathisia and frequent falls.  Skin:    General: Skin is warm.  Neurological:     General: No focal deficit present.     Mental Status: He is alert and oriented to person, place, and time.     Coordination: Coordination abnormal.  Psychiatric:        Mood and Affect: Mood normal.  Behavior: Behavior  normal.    Review of Systems  Constitutional:  Negative for chills and fever.  HENT:  Negative for sore throat.   Eyes:  Negative for blurred vision.  Respiratory:  Negative for cough, sputum production, shortness of breath and wheezing.   Cardiovascular:  Negative for chest pain and palpitations.  Gastrointestinal:  Negative for abdominal pain, constipation, diarrhea, heartburn, nausea and vomiting.  Genitourinary: Negative.   Musculoskeletal:  Positive for falls (History of frequent falls).  Skin:  Negative for itching and rash.  Neurological:  Positive for seizures. Negative for dizziness, tingling, tremors, sensory change, speech change and headaches.  Endo/Heme/Allergies:        See allergy listing  Psychiatric/Behavioral:  Positive for depression. The patient is nervous/anxious.    Blood pressure 134/88, pulse (!) 109, temperature 97.7 F (36.5 C), temperature source Oral, resp. rate 18, height 6\' 3"  (1.905 m), weight 94.8 kg, SpO2 97%. Body mass index is 26.12 kg/m.  COGNITIVE FEATURES THAT CONTRIBUTE TO RISK:  Polarized thinking    SUICIDE RISK:   Moderate:  Frequent suicidal ideation with limited intensity, and duration, some specificity in terms of plans, no associated intent, good self-control, limited dysphoria/symptomatology, some risk factors present, and identifiable protective factors, including available and accessible social support.  PLAN OF CARE: Physician Treatment Plan for Primary Diagnosis:  Assessment: Schizoaffective disorder, depressive type (HCC) Alcohol use disorder GAD with panic attacks History of agoraphobia Developmental delay  Plan: Medication: Continue Clozapine tablets 300 mg p.o. twice daily for psychosis Continue Prozac 40 mg p.o. daily for depression Continue Ativan tablet 0.5 mg p.o. every 8 hours as needed for anxiety  Medications for other medical problems: Continue aspirin chewable tablet 81 mg p.o. daily Continue Lipitor tablets  20 mg p.o. daily for hyperlipidemia Continue Colace capsule 100 mg p.o. daily for constipation Continue Keppra tablet 500 mg p.o. 2 times daily for seizures prevention Continue Synthroid tablet 50 mcg p.o. daily for hypothyroidism Continue Protonix EC tablet 80 mg p.o. daily for GERD  Agitation protocol: Benadryl capsule 50 mg p.o. or IM 3 times daily as needed agitation   Haldol tablets 5 mg po IM 3 times daily as needed agitation   Lorazepam tablet 2 mg p.o. or IM 3 times daily as needed agitation    Other PRN Medications -Acetaminophen 650 mg every 6 as needed/mild pain -Maalox 30 mL oral every 4 as needed/digestion -Magnesium hydroxide 30 mL daily as needed/mild constipation  -- The risks/benefits/side-effects/alternatives to this medication were discussed in detail with the patient and time was given for questions. The patient consents to medication trial.  -- Metabolic profile and EKG monitoring obtained while on an atypical antipsychotic (BMI: Lipid Panel: HbgA1c: QTc:)  -- Encouraged patient to participate in unit milieu and in scheduled group therapies   Labs reviewed: CMP: Glucose 131 high, BUN 24 high, creatinine 1.52 high, GFR estimated 53 low, calcium 8.3 low.  Lipid panel: HDL 37 low, triglyceride 152 hide.  Otherwise normal.  CBC with differential: RBC 3.73 low, hemoglobin 11.9 low, HCT 35.1 low, platelets 122 low.  Otherwise normal.  Absolute neutrophils 5.3 WNL.  UDS: Positive for benzodiazepine(prescribed).  Labs ordered: Hemoglobin A1c, TSH, vitamin D 25-hydroxy  EKG reviewed: NSR, ventricular rate 96, QT/QTc 400/505   Safety and Monitoring: Voluntary admission to inpatient psychiatric unit for safety, stabilization and treatment Daily contact with patient to assess and evaluate symptoms and progress in treatment Patient's case to be discussed in multi-disciplinary team meeting Observation Level :  q15 minute checks Vital signs: q12 hours Precautions: suicide, but pt  currently verbally contracts for safety on unit    Discharge Planning: Social work and case management to assist with discharge planning and identification of hospital follow-up needs prior to discharge Estimated LOS: 5-7 days Discharge Concerns: Need to establish a safety plan; Medication compliance and effectiveness Discharge Goals: Return home with outpatient referrals for mental health follow-up including medication management/psychotherapy.  Long Term Goal(s): Improvement in symptoms so as ready for discharge  Short Term Goals: Ability to identify changes in lifestyle to reduce recurrence of condition will improve, Ability to verbalize feelings will improve, Ability to disclose and discuss suicidal ideas, Ability to demonstrate self-control will improve, Ability to identify and develop effective coping behaviors will improve, Ability to maintain clinical measurements within normal limits will improve, Compliance with prescribed medications will improve, and Ability to identify triggers associated with substance abuse/mental health issues will improve  Physician Treatment Plan for Secondary Diagnosis: Principal Problem:   Schizoaffective disorder, depressive type (HCC)  I certify that inpatient services furnished can reasonably be expected to improve the patient's condition.   Cecilie Lowers, FNP 09/22/2023, 10:16 AM

## 2023-09-22 NOTE — BH IP Treatment Plan (Signed)
Interdisciplinary Treatment and Diagnostic Plan Update  09/22/2023 Time of Session: 11:05am Brady Hoffman MRN: [id number]  Principal Diagnosis: Schizoaffective disorder, depressive type [facility])  Secondary Diagnoses: Principal Problem:   Schizoaffective disorder, depressive type (HCC)   Current Medications:  Current Facility-Administered Medications  Medication Dose Route Frequency Provider Last Rate Last Admin   acetaminophen (TYLENOL) tablet 650 mg  650 mg Oral Q6H PRN Earney Navy, NP       alum & mag hydroxide-simeth (MAALOX/MYLANTA) 200-200-20 MG/5ML suspension 30 mL  30 mL Oral Q4H PRN Dahlia Byes C, NP       aspirin chewable tablet 81 mg  81 mg Oral Daily Dahlia Byes C, NP   81 mg at 09/22/23 0801   atorvastatin (LIPITOR) tablet 20 mg  20 mg Oral QHS Dahlia Byes C, NP   20 mg at 09/21/23 2131   [START ON 09/23/2023] cloZAPine (CLOZARIL) tablet 300 mg  300 mg Oral Q12H Massengill, Harrold Donath, MD       diphenhydrAMINE (BENADRYL) capsule 50 mg  50 mg Oral TID PRN Earney Navy, NP       Or   diphenhydrAMINE (BENADRYL) injection 50 mg  50 mg Intramuscular TID PRN Dahlia Byes C, NP       docusate sodium (COLACE) capsule 100 mg  100 mg Oral Daily Dahlia Byes C, NP   100 mg at 09/22/23 0801   FLUoxetine (PROZAC) capsule 40 mg  40 mg Oral Daily Dahlia Byes C, NP   40 mg at 09/22/23 0801   haloperidol (HALDOL) tablet 5 mg  5 mg Oral TID PRN Earney Navy, NP       Or   haloperidol lactate (HALDOL) injection 5 mg  5 mg Intramuscular TID PRN Dahlia Byes C, NP       influenza vac split trivalent PF (FLULAVAL) injection 0.5 mL  0.5 mL Intramuscular Tomorrow-1000 Massengill, Harrold Donath, MD       levETIRAcetam (KEPPRA) tablet 500 mg  500 mg Oral BID Dahlia Byes C, NP   500 mg at 09/22/23 0801   levothyroxine (SYNTHROID) tablet 50 mcg  50 mcg Oral Q0600 Dahlia Byes C, NP   50 mcg at 09/22/23 7829   LORazepam (ATIVAN) tablet 2 mg   2 mg Oral TID PRN Earney Navy, NP       Or   LORazepam (ATIVAN) injection 2 mg  2 mg Intramuscular TID PRN Earney Navy, NP       LORazepam (ATIVAN) tablet 0.5 mg  0.5 mg Oral Q8H PRN Dahlia Byes C, NP   0.5 mg at 09/21/23 2131   magnesium hydroxide (MILK OF MAGNESIA) suspension 30 mL  30 mL Oral Daily PRN Earney Navy, NP       pantoprazole (PROTONIX) EC tablet 80 mg  80 mg Oral Daily Dahlia Byes C, NP   80 mg at 09/22/23 0801   PTA Medications: Medications Prior to Admission  Medication Sig Dispense Refill Last Dose   acetaminophen (TYLENOL) 500 MG tablet Take 500-1,000 mg by mouth every 4 (four) hours as needed (pain).      aspirin 81 MG chewable tablet Chew 1 tablet (81 mg total) by mouth daily.      atorvastatin (LIPITOR) 20 MG tablet Take 1 tablet (20 mg total) by mouth at bedtime. 30 tablet 1    clonazePAM (KLONOPIN) 0.5 MG tablet Take 1 tablet (0.5 mg total) by mouth at bedtime. (Patient not taking: Reported on 09/20/2023) 30 tablet 0  cloZAPine (CLOZARIL) 100 MG tablet Take 1 tablet (100 mg total) by mouth 2 (two) times daily. (Patient taking differently: Take 300 mg by mouth See admin instructions. Take 300mg  in the morning and 300mg  at bedtime) 60 tablet 0    docusate sodium (COLACE) 100 MG capsule Take 100 mg by mouth daily.       FLUoxetine (PROZAC) 40 MG capsule Take 40 mg by mouth 2 (two) times daily.      folic acid (FOLVITE) 400 MCG tablet Take 1,000 mcg by mouth daily.      levETIRAcetam (KEPPRA) 500 MG tablet Take 500 mg by mouth 2 (two) times daily.      levothyroxine (SYNTHROID, LEVOTHROID) 50 MCG tablet Take 1 tablet (50 mcg total) by mouth daily before breakfast. For underactive thyroid 30 tablet 0    LORazepam (ATIVAN) 1 MG tablet Take 1 mg by mouth 2 (two) times daily as needed.      omeprazole (PRILOSEC) 40 MG capsule Take 1 capsule (40 mg total) by mouth daily. 30 capsule 1    QUEtiapine (SEROQUEL) 100 MG tablet Take 100 mg by  mouth at bedtime. (Patient not taking: Reported on 09/20/2023)       Patient Stressors: Medication change or noncompliance    Patient Strengths: Supportive family/friends   Treatment Modalities: Medication Management, Group therapy, Case management,  1 to 1 session with clinician, Psychoeducation, Recreational therapy.   Physician Treatment Plan for Primary Diagnosis: Schizoaffective disorder, depressive type (HCC) Long Term Goal(s): Improvement in symptoms so as ready for discharge   Short Term Goals: Ability to identify changes in lifestyle to reduce recurrence of condition will improve Ability to verbalize feelings will improve Ability to disclose and discuss suicidal ideas Ability to demonstrate self-control will improve Ability to identify and develop effective coping behaviors will improve Ability to maintain clinical measurements within normal limits will improve Compliance with prescribed medications will improve Ability to identify triggers associated with substance abuse/mental health issues will improve  Medication Management: Evaluate patient's response, side effects, and tolerance of medication regimen.  Therapeutic Interventions: 1 to 1 sessions, Unit Group sessions and Medication administration.  Evaluation of Outcomes: Not Progressing  Physician Treatment Plan for Secondary Diagnosis: Principal Problem:   Schizoaffective disorder, depressive type (HCC)  Long Term Goal(s): Improvement in symptoms so as ready for discharge   Short Term Goals: Ability to identify changes in lifestyle to reduce recurrence of condition will improve Ability to verbalize feelings will improve Ability to disclose and discuss suicidal ideas Ability to demonstrate self-control will improve Ability to identify and develop effective coping behaviors will improve Ability to maintain clinical measurements within normal limits will improve Compliance with prescribed medications will  improve Ability to identify triggers associated with substance abuse/mental health issues will improve     Medication Management: Evaluate patient's response, side effects, and tolerance of medication regimen.  Therapeutic Interventions: 1 to 1 sessions, Unit Group sessions and Medication administration.  Evaluation of Outcomes: Not Progressing   RN Treatment Plan for Primary Diagnosis: Schizoaffective disorder, depressive type (HCC) Long Term Goal(s): Knowledge of disease and therapeutic regimen to maintain health will improve  Short Term Goals: Ability to remain free from injury will improve, Ability to verbalize frustration and anger appropriately will improve, Ability to demonstrate self-control, Ability to participate in decision making will improve, Ability to verbalize feelings will improve, Ability to disclose and discuss suicidal ideas, Ability to identify and develop effective coping behaviors will improve, and Compliance with prescribed medications will  improve  Medication Management: RN will administer medications as ordered by provider, will assess and evaluate patient's response and provide education to patient for prescribed medication. RN will report any adverse and/or side effects to prescribing provider.  Therapeutic Interventions: 1 on 1 counseling sessions, Psychoeducation, Medication administration, Evaluate responses to treatment, Monitor vital signs and CBGs as ordered, Perform/monitor CIWA, COWS, AIMS and Fall Risk screenings as ordered, Perform wound care treatments as ordered.  Evaluation of Outcomes: Not Progressing   LCSW Treatment Plan for Primary Diagnosis: Schizoaffective disorder, depressive type (HCC) Long Term Goal(s): Safe transition to appropriate next level of care at discharge, Engage patient in therapeutic group addressing interpersonal concerns.  Short Term Goals: Engage patient in aftercare planning with referrals and resources, Increase social  support, Increase ability to appropriately verbalize feelings, Increase emotional regulation, Facilitate acceptance of mental health diagnosis and concerns, Facilitate patient progression through stages of change regarding substance use diagnoses and concerns, Identify triggers associated with mental health/substance abuse issues, and Increase skills for wellness and recovery  Therapeutic Interventions: Assess for all discharge needs, 1 to 1 time with Social worker, Explore available resources and support systems, Assess for adequacy in community support network, Educate family and significant other(s) on suicide prevention, Complete Psychosocial Assessment, Interpersonal group therapy.  Evaluation of Outcomes: Not Progressing   Progress in Treatment: Attending groups: No. Participating in groups: No. Taking medication as prescribed: Yes. Toleration medication: Yes. Family/Significant other contact made: No, will contact:  Consent Pending Patient understands diagnosis: No. Discussing patient identified problems/goals with staff: Yes. Medical problems stabilized or resolved: Yes. Denies suicidal/homicidal ideation: Yes. Issues/concerns per patient self-inventory: No.   New problem(s) identified: No, Describe:  none reported  New Short Term/Long Term Goal(s): medication stabilization, elimination of SI thoughts, development of comprehensive mental wellness plan.    Patient Goals:  "Get my medications right"  Discharge Plan or Barriers: Patient recently admitted. CSW will continue to follow and assess for appropriate referrals and possible discharge planning.    Reason for Continuation of Hospitalization: Medication stabilization  Estimated Length of Stay: 5-7 days  Last 3 [place] Suicide Severity Risk Score: Flowsheet Row [facility]19/[facility]23 in [facility] Department at [facility]   C-SSRS RISK CATEGORY No Risk No Risk       Last PHQ 2/9 Scores:     No data to display          Scribe for Treatment Team: Kathi Der, LCSWA 09/22/2023 1:51 PM

## 2023-09-22 NOTE — Plan of Care (Signed)
  Problem: Safety: Goal: Periods of time without injury will increase Outcome: Progressing   

## 2023-09-22 NOTE — Group Note (Signed)
Date:  09/22/2023 Time:  9:40 AM  Group Topic/Focus:  Goals Group:   The focus of this group is to help patients establish daily goals to achieve during treatment and discuss how the patient can incorporate goal setting into their daily lives to aide in recovery.    Participation Level:  Did Not Attend    Donell Beers 09/22/2023, 9:40 AM

## 2023-09-22 NOTE — Progress Notes (Signed)
Pharmacy: clozapine  Pt currently enrolled on clozapine rems require monthly cbc with dif  Rems ID [id number]  RDA obtained [id number]  Peggye Fothergill, Pharm D

## 2023-09-22 NOTE — Progress Notes (Signed)
   09/21/23 2135  Psych Admission Type (Psych Patients Only)  Admission Status Voluntary  Psychosocial Assessment  Patient Complaints Anxiety  Eye Contact Brief  Facial Expression Flat  Affect Appropriate to circumstance;Blunted  Speech Soft  Interaction No initiation;Minimal;Isolative  Motor Activity Shuffling;Slow;Unsteady;Tremors  Appearance/Hygiene [employer];Appropriate to situation;Anxious  Mood Anxious;Pleasant  Thought Process  Coherency Concrete thinking  Content WDL  Delusions None reported or observed  Perception WDL  Hallucination None reported or observed  Judgment WDL  Confusion None  Danger to Self  Current suicidal ideation? Denies  Danger to Others  Danger to Others None reported or observed

## 2023-09-22 NOTE — Progress Notes (Signed)
   09/22/23 0801  Psych Admission Type (Psych Patients Only)  Admission Status Voluntary  Psychosocial Assessment  Patient Complaints Shakiness  Eye Contact Brief  Facial Expression Flat  Affect Blunted  Speech Soft;Slow  Interaction Childlike;Isolative;Minimal;No initiation  Motor Activity Tremors;Shuffling;Unsteady;Slow  Appearance/Hygiene Disheveled  Behavior Characteristics Cooperative;Guarded  Mood Anxious;Pleasant  Thought Process  Coherency Concrete thinking  Content WDL  Delusions None reported or observed  Perception WDL  Hallucination None reported or observed  Judgment Limited  Confusion None  Danger to Self  Current suicidal ideation? Denies  Danger to Others  Danger to Others None reported or observed

## 2023-09-22 NOTE — Group Note (Signed)
[facility] LCSW Group Therapy Note    Group Date: 09/22/2023 Start Time: 1300 End Time: 1400  Type of Therapy and Topic:  Group Therapy:  Overcoming Obstacles  Participation Level:  [facility] PARTICIPATION LEVEL: Did Not Attend  Mood: N/A  Description of Group:   In this group patients will be encouraged to explore what they see as obstacles to their own wellness and recovery. They will be guided to discuss their thoughts, feelings, and behaviors related to these obstacles. The group will process together ways to cope with barriers, with attention given to specific choices patients can make. Each patient will be challenged to identify changes they are motivated to make in order to overcome their obstacles. This group will be process-oriented, with patients participating in exploration of their own experiences as well as giving and receiving support and challenge from other group members.  Therapeutic Goals: 1. Patient will identify personal and current obstacles as they relate to admission. 2. Patient will identify barriers that currently interfere with their wellness or overcoming obstacles.  3. Patient will identify feelings, thought process and behaviors related to these barriers. 4. Patient will identify two changes they are willing to make to overcome these obstacles:    Summary of Patient Progress Pt did not attend.     Therapeutic Modalities:   Cognitive Behavioral Therapy Solution Focused Therapy Motivational Interviewing Relapse Prevention Therapy   Jeiry Birnbaum S Chavez Rosol, LCSW

## 2023-09-23 DIAGNOSIS — F251 Schizoaffective disorder, depressive type: Secondary | ICD-10-CM | POA: Diagnosis not present

## 2023-09-23 LAB — HEMOGLOBIN A1C
Hgb A1c MFr Bld: 5.7 % — ABNORMAL HIGH (ref 4.8–5.6)
Mean Plasma Glucose: 116.89 mg/dL

## 2023-09-23 MED ORDER — LORAZEPAM 2 MG/ML IJ SOLN
2.0000 mg | INTRAMUSCULAR | Status: AC
  Administered 2023-09-23: 2 mg via INTRAMUSCULAR
  Filled 2023-09-23: qty 1

## 2023-09-23 MED ORDER — LORAZEPAM 1 MG PO TABS
1.0000 mg | ORAL_TABLET | Freq: Three times a day (TID) | ORAL | Status: DC
  Administered 2023-09-23 – 2023-09-24 (×3): 1 mg via ORAL
  Filled 2023-09-23 (×3): qty 1

## 2023-09-23 MED ORDER — SULFACETAMIDE SODIUM 10 % OP SOLN
1.0000 [drp] | OPHTHALMIC | Status: AC
  Administered 2023-09-23 – 2023-09-25 (×13): 1 [drp] via OPHTHALMIC
  Filled 2023-09-23: qty 15

## 2023-09-23 MED ORDER — CLOZAPINE 100 MG PO TABS
300.0000 mg | ORAL_TABLET | Freq: Two times a day (BID) | ORAL | Status: DC
  Administered 2023-09-23 – 2023-09-25 (×5): 300 mg via ORAL
  Filled 2023-09-23 (×9): qty 3

## 2023-09-23 NOTE — Plan of Care (Signed)
  Problem: Safety: Goal: Periods of time without injury will increase Outcome: Progressing   

## 2023-09-23 NOTE — Progress Notes (Signed)
   09/22/23 2100  Psych Admission Type (Psych Patients Only)  Admission Status Voluntary  Psychosocial Assessment  Patient Complaints None  Eye Contact Fair  Facial Expression Flat  Affect Blunted  Speech Soft;Slow  Interaction Childlike;Minimal  Motor Activity Tremors;Unsteady;Slow  Appearance/Hygiene Disheveled  Behavior Characteristics Cooperative;Guarded  Mood Anxious;Pleasant  Thought Process  Coherency Concrete thinking  Content WDL  Delusions None reported or observed  Perception WDL  Hallucination None reported or observed  Judgment Limited  Confusion None  Danger to Self  Current suicidal ideation? Denies  Danger to Others  Danger to Others None reported or observed

## 2023-09-23 NOTE — Plan of Care (Signed)
  Problem: Activity: Goal: Sleeping patterns will improve Outcome: Progressing   Problem: Coping: Goal: Ability to demonstrate self-control will improve Outcome: Progressing   Problem: Safety: Goal: Periods of time without injury will increase Outcome: Progressing   Problem: Education: Goal: Emotional status will improve Outcome: Not Progressing Goal: Mental status will improve Outcome: Not Progressing   Problem: Activity: Goal: Interest or engagement in activities will improve Outcome: Not Progressing

## 2023-09-23 NOTE — Progress Notes (Signed)
   09/23/23 2015  Psych Admission Type (Psych Patients Only)  Admission Status Voluntary  Psychosocial Assessment  Patient Complaints None  Eye Contact Avertive  Facial Expression Flat  Affect Appropriate to circumstance  Speech Soft  Interaction Cautious;Guarded  Motor Activity Restless;Fidgety  Appearance/Hygiene Disheveled  Behavior Characteristics Cooperative  Mood Anxious;Pleasant  Aggressive Behavior  Effect No apparent injury  Thought Administrator, sports thinking  Content WDL  Delusions WDL  Perception WDL  Hallucination None reported or observed  Judgment Limited  Confusion WDL  Danger to Self  Current suicidal ideation? Denies

## 2023-09-23 NOTE — Group Note (Signed)
Recreation Therapy Group Note   Group Topic:Health and Wellness  Group Date: 09/23/2023 Start Time: 1100 End Time: 1125 Facilitators: Burnis Halling-McCall, LRT,CTRS Location: [address] Dayroom   Group Topic: Wellness  Goal Area(s) Addresses:  Patient will define components of whole wellness. Patient will verbalize benefit of whole wellness.  Group Description. Exercise. LRT and patients talked about the importance of physical wellness in self care. Patients took turns leading the group in the exercises and stretches of their choosing. LRT explained to peers they could take breaks or get water as needed.   Education: Wellness, Building control surveyor.   Education Outcome: Acknowledges education/In group clarification offered/Needs additional education.    Affect/Mood: N/A   Participation Level: Did not attend    Clinical Observations/Individualized Feedback:     Plan: Continue to engage patient in RT group sessions 2-3x/week.   Brady Hoffman, LRT,CTRS 09/23/2023 1:36 PM

## 2023-09-23 NOTE — Progress Notes (Signed)
   09/23/23 0600  15 Minute Checks  Location Bedroom  Visual Appearance Calm  Behavior Sleeping  Sleep (Behavioral Health Patients Only)  Calculate sleep? (Click Yes once per 24 hr at 0600 safety check) Yes  Documented sleep last 24 hours 7.5

## 2023-09-23 NOTE — Progress Notes (Signed)
[facility] MD Progress Note  09/23/2023 3:05 PM Brady Hoffman  MRN:  [id number]  Subjective:    Brady Hoffman is a 57 year old Caucasian male with prior psychiatric history significant for schizoaffective disorder, seizure disorder, developmental delay, alcohol use disorder, who presents voluntarily [facility] from [facility] health ED at [facility] for evaluation for his mental health illness and frequent incidence of falls.  After medical evaluation/stabilization & clearance, he was transferred to the [facility] for further psychiatric evaluation & treatments.   Yesterday the psychiatry team made the following recommendations: Continue Clozapine tablets 300 mg p.o. twice daily for psychosis Continue Prozac 40 mg p.o. daily for depression Continue Ativan tablet 0.5 mg p.o. every 8 hours as needed for anxiety  On exam this morning, the pt was mostly mute, immobile, stuporous, mannerisms, rigidity, waxy flexibility, and not eating. Tremor was noticeably less on today's exam this morning.   In the afternoon, after receiving 2 mg IM ativan dose, for ativan challenge, and the pt within about 10 minutes, started to talk, was more mobile, eating, more interactive, less stuporous and negativism. Pt continued to have significant waxy flexibly on exam.  PT HAD POSITIVE RESPONSE TO ATIVAN CHALLENGE. On exam after ativan challenge, he was A&Ox3, more clear attention and concentration, and able to spell world forwards and backwards.   Overall, he is flat and reports feeling down. Reports anxiety level is moderate. Reports sleep is okay. Appetite was poor, not eating dinner, breakfast or lunch, but started eating after ativan challenge. Denying SI, HI, AVH.   Clozapine was restarted this morning - unclear why order was changed to start 10-22 evening.    Principal Problem: Schizoaffective disorder, depressive type (HCC) Diagnosis: Principal Problem:   Schizoaffective disorder, depressive type (HCC)  Total Time  spent with patient: 20 minutes  Past Psychiatric History:  Previous Psych Diagnoses: Panic disorder with agoraphobia, history of seizures, history of alcohol use disorder, schizoaffective disorder Prior inpatient treatment: Was admitted to [facility] 5 years ago Current/prior outpatient treatment: Yes with Dr. Jannifer Franklin Prior rehab hx: Denies Psychotherapy hx: Yes History of suicide: Denies History of homicide or aggression: Denies Psychiatric medication history: Patient has been on trial Seroquel, Prolixin, Effexor, and Haldol Psychiatric medication compliance history: Compliance Neuromodulation history: Denies Current Psychiatrist: Dr. Jannifer Franklin Current therapist: Dr. 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    Pervasive developmental disorder    Schizoaffective disorder    Seizures (HCC)     Past Surgical History:  Procedure Laterality Date   ESOPHAGOGASTRODUODENOSCOPY  11/01/2011   Procedure: ESOPHAGOGASTRODUODENOSCOPY (EGD);  Surgeon: Theda Belfast;  Location: [facility] END[facility]COPY;  Service: Endoscopy;  Laterality: N/A;   MOLE REMOVAL     WISDOM TOOTH EXTRACTION     Family History:  Family History  Adopted: Yes   Family Psychiatric  History: See H&P   Social History:  Social History   Substance and Sexual Activity  Alcohol Use No     Social History   Substance and Sexual Activity  Drug Use No    Social History   Socioeconomic History   Marital status: Single    Spouse name: Not on file   Number of children: 0   Years of education: College   Highest education level:  Not on file  Occupational History    Employer: OTHER  Tobacco Use   Smoking status: Never   Smokeless tobacco: Never  Substance and Sexual Activity   Alcohol use: No   Drug use: No   Sexual  activity: Never  Other Topics Concern   Not on file  Social History Narrative   Patient lives in [facility].   Right handed.   Education  Two years of college.Caffeine Use: 2 cups daily  Coke cola.    Social Determinants of Health   Financial Resource Strain: Not on file  Food Insecurity: No Food Insecurity (09/21/2023)   Hunger Vital Sign    Worried About Running Out of Food in the Last Year: Never true    Ran Out of Food in the Last Year: Never true  Transportation Needs: No Transportation Needs (09/21/2023)   PRAPARE - Administrator, Civil Service (Medical): No    Lack of Transportation (Non-Medical): No  Physical Activity: Not on file  Stress: Not on file  Social Connections: Not on file   Additional Social History:                           Current Medications: Current Facility-Administered Medications  Medication Dose Route Frequency Provider Last Rate Last Admin   acetaminophen (TYLENOL) tablet 650 mg  650 mg Oral Q6H PRN Dahlia Byes C, NP       alum & mag hydroxide-simeth (MAALOX/MYLANTA) 200-200-20 MG/5ML suspension 30 mL  30 mL Oral Q4H PRN Dahlia Byes C, NP       aspirin chewable tablet 81 mg  81 mg Oral Daily Onuoha, Josephine C, NP   81 mg at 09/23/23 0850   atorvastatin (LIPITOR) tablet 20 mg  20 mg Oral QHS Dahlia Byes C, NP   20 mg at 09/22/23 2048   cloZAPine (CLOZARIL) tablet 300 mg  300 mg Oral Q12H Tkai Serfass, Harrold Donath, MD   300 mg at 09/23/23 0851   diphenhydrAMINE (BENADRYL) capsule 50 mg  50 mg Oral TID PRN Earney Navy, NP       Or   diphenhydrAMINE (BENADRYL) injection 50 mg  50 mg Intramuscular TID PRN Dahlia Byes C, NP       docusate sodium (COLACE) capsule 100 mg  100 mg Oral Daily Dahlia Byes C, NP   100 mg at 09/23/23 0850   FLUoxetine (PROZAC) capsule 40 mg  40 mg Oral Daily Dahlia Byes C, NP   40 mg at 09/23/23 0851   haloperidol (HALDOL) tablet 5 mg  5 mg Oral TID PRN  Dahlia Byes C, NP       Or   haloperidol lactate (HALDOL) injection 5 mg  5 mg Intramuscular TID PRN Dahlia Byes C, NP       influenza vac split trivalent PF (FLULAVAL) injection 0.5 mL  0.5 mL Intramuscular Tomorrow-1000 Sanjay Broadfoot, Harrold Donath, MD       levETIRAcetam (KEPPRA) tablet 500 mg  500 mg Oral BID Dahlia Byes C, NP   500 mg at 09/23/23 0851   levothyroxine (SYNTHROID) tablet 50 mcg  50 mcg Oral Q0600 Dahlia Byes C, NP   50 mcg at 09/23/23 0636   LORazepam (ATIVAN) tablet 2 mg  2 mg Oral TID PRN Dahlia Byes C, NP       Or   LORazepam (ATIVAN) injection 2 mg  2 mg Intramuscular TID PRN Earney Navy, NP  LORazepam (ATIVAN) tablet 0.5 mg  0.5 mg Oral Q8H PRN Dahlia Byes C, NP   0.5 mg at 09/21/23 2131   LORazepam (ATIVAN) tablet 1 mg  1 mg Oral Q8H Ival Basquez, MD       magnesium hydroxide (MILK OF MAGNESIA) suspension 30 mL  30 mL Oral Daily PRN Dahlia Byes C, NP       pantoprazole (PROTONIX) EC tablet 80 mg  80 mg Oral Daily Onuoha, Josephine C, NP   80 mg at 09/23/23 2956    Lab Results:  Results for orders placed or performed during the hospital encounter of 09/21/23 (from the past 48 hour(s))  TSH     Status: None   Collection Time: 09/21/23  7:00 PM  Result Value Ref Range   TSH 3.267 0.350 - 4.500 uIU/mL    Comment: Performed by a 3rd Generation assay with a functional sensitivity of <=0.01 uIU/mL. Performed at [facility], [address] [place], [place] [address]   Hemoglobin A1c     Status: Abnormal   Collection Time: 09/23/23  6:39 AM  Result Value Ref Range   Hgb A1c MFr Bld 5.7 (H) 4.8 - 5.6 %    Comment: (NOTE) Pre diabetes:          5.7%-6.4%  Diabetes:              >6.4%  Glycemic control for   <7.0% adults with diabetes    Mean Plasma Glucose 116.89 mg/dL    Comment: Performed at [facility], [address] [place], [place] [address]    Blood Alcohol level:  Lab  Results  Component Value Date   [facility] <10 09/19/2023   ETH <10 09/28/2019    Metabolic Disorder Labs: Lab Results  Component Value Date   HGBA1C 5.7 (H) 09/23/2023   MPG 116.89 09/23/2023   MPG 114 02/25/2014   No results found for: "PROLACTIN" Lab Results  Component Value Date   CHOL 144 07/03/2023   TRIG 152 (H) 07/03/2023   HDL 37 (L) 07/03/2023   CHOLHDL 3.9 07/03/2023   VLDL 28 10/20/2009   LDLCALC 82 07/03/2023   LDLCALC 59 10/02/2021    Physical Findings: AIMS:  , ,  ,  ,    CIWA:    COWS:     Musculoskeletal: Strength & Muscle Tone: increased Gait & Station: unsteady Patient leans: N/A  Psychiatric Specialty Exam:  Presentation  General Appearance:  Bizarre; Disheveled  Eye Contact: Poor  Speech: Blocked; Slow  Speech Volume: Decreased  Handedness: Right   Mood and Affect  Mood: Anxious  Affect: Flat   Thought Process  Thought Processes: -- (halted)  Descriptions of Associations:Intact  Orientation:Full (Time, Place and Person)  Thought Content:Logical  History of Schizophrenia/Schizoaffective disorder:No data recorded Duration of Psychotic Symptoms:No data recorded Hallucinations:Hallucinations: None Description of Auditory Hallucinations: Hearing voices telling him to go to the bathroom  Ideas of Reference:None  Suicidal Thoughts:Suicidal Thoughts: No  Homicidal Thoughts:Homicidal Thoughts: No   Sensorium  Memory: Immediate Fair; Recent Fair; Remote Fair  Judgment: Intact  Insight: Lacking   Executive Functions  Concentration: Poor  Attention Span: Poor  Recall: Good  Fund of Knowledge: Fair  Language: Good   Psychomotor Activity  Psychomotor Activity: Psychomotor Activity: Normal Extrapyramidal Side Effects (EPS): Akathisia AIMS Completed?: No   Assets  Assets: Communication Skills; Desire for Improvement; Housing; Physical Health; Resilience; Social Support   Sleep  Sleep: Sleep:  Fair Number of Hours  of Sleep: 10    Physical Exam: Physical Exam Pulmonary:     Effort: Pulmonary effort is normal.  Neurological:     Motor: Weakness present.     Gait: Gait abnormal.    Review of Systems  Neurological:  Positive for tremors.  Psychiatric/Behavioral:  The patient is nervous/anxious.    Blood pressure 134/88, pulse (!) 109, temperature 97.7 F (36.5 C), temperature source Oral, resp. rate 18, height 6\' 3"  (1.905 m), weight 94.8 kg, SpO2 97%. Body mass index is 26.12 kg/m.   Treatment Plan Summary: Daily contact with patient to assess and evaluate symptoms and progress in treatment and Medication management     ASSESSMENT:  Diagnoses / Active Problems: Schizoaffective disorder, depressive type, with catatonic features  GAD with panic attacks and agoraphobia Alcohol use d/o - ?in remission? Developmental delay    PLAN: Safety and Monitoring:  --  Voluntary admission to inpatient psychiatric unit for safety, stabilization and treatment  -- Daily contact with patient to assess and evaluate symptoms and progress in treatment  -- Patient's case to be discussed in multi-disciplinary team meeting  -- Observation Level : q15 minute checks  -- Vital signs:  q12 hours  -- Precautions: suicide, elopement, and assault  2. Psychiatric Diagnoses and Treatment:    -Ativan 2 mg IM administered for ativan challenge for catatonia - had positive response  -Start ativan 1 mg q8H for catatonic features of presumably psychosis (could also be due to substance withdrawal or underlying medical or neurological condition).   - We will consider MRI, it is also a concern the pt had some neurological change (falls, unsteadiness, that accompanied worsening psychosis/ocd - r/o previous CVA - ie - vascular process superimposed on severe long term schizophrenia )   -Continue Clozapine tablets 300 mg p.o. twice daily for psychosis (re-started this morning 10-22)  -Continue Prozac  40 mg p.o. daily for depression -Continue Ativan tablet 0.5 mg p.o. every 8 hours as needed for anxiety   --  The risks/benefits/side-effects/alternatives to this medication were discussed in detail with the patient and time was given for questions. The patient consents to medication trial.    -- Metabolic profile and EKG monitoring obtained while on an atypical antipsychotic (BMI: Lipid Panel: HbgA1c: QTc:) 505  -- Encouraged patient to participate in unit milieu and in scheduled group therapies        3. Medical Issues Being Addressed:    Continue aspirin chewable tablet 81 mg p.o. daily Continue Lipitor tablets 20 mg p.o. daily for hyperlipidemia Continue Colace capsule 100 mg p.o. daily for constipation Continue Keppra tablet 500 mg p.o. 2 times daily for seizures prevention Continue Synthroid tablet 50 mcg p.o. daily for hypothyroidism Continue Protonix EC tablet 80 mg p.o. daily for GERD   4. Discharge Planning:   -- Social work and case management to assist with discharge planning and identification of hospital follow-up needs prior to discharge  -- Estimated LOS: 5-7 days  -- Discharge Concerns: Need to establish a safety plan; Medication compliance and effectiveness  -- Discharge Goals: Return home with outpatient referrals for mental health follow-up including medication management/psychotherapy  Phineas Inches, MD 09/23/2023, 3:05 PM  Total Time Spent in Direct Patient Care:  I personally spent 50 minutes on the unit in direct patient care. The direct patient care time included face-to-face time with the patient, reviewing the patient's chart, communicating with other professionals, and coordinating care. Greater than 50% of this time was spent in counseling or coordinating  care with the patient regarding goals of hospitalization, psycho-education, and discharge planning needs.   Phineas Inches, MD Psychiatrist

## 2023-09-23 NOTE — [facility] Suicide Risk Assessment (Signed)
[facility] INPATIENT:  Family/Significant Other Suicide Prevention Education  Suicide Prevention Education:  Education Completed; Roena Malady Chief Strategy Officer ) [phone],  (name of family member/significant other) has been identified by the patient as the family member/significant other with whom the patient will be residing, and identified as the person(s) who will aid the patient in the event of a mental health crisis (suicidal ideations/suicide attempt).  With written consent from the patient, the family member/significant other has been provided the following suicide prevention education, prior to the and/or following the discharge of the patient.  CSW spoke with Ms. Freida Busman the Quarry manager at [employer] and completed safety planning. Ms. Freida Busman shared that patient has bad anxiety and at time does not know how to control his anxiety, but did not want him to start to harm himself. Ms. Freida Busman continued on saying that patient would pull his pants up and down or fall hard wherever he is sitting. Ms. Freida Busman stated that she is unsure if patient current medications he was taking at home PRN were not in his system or just not working but mentioned that patient psychitrist suggested for him to come for medication management before he got worse. Ms. Freida Busman shared that patient mother can to visit and told her that patient did not look to good. Ms. Freida Busman said that what patient mother described was not his baseline and denied patient having access to any guns or weapons when asking; patient will return back to the group home once ready for DC.   The suicide prevention education provided includes the following: Suicide risk factors Suicide prevention and interventions National Suicide Hotline telephone number [facility] assessment telephone number [facility] Emergency Assistance 911 [facility] and/or Residential Mobile Crisis Unit telephone number  Request made of family/significant  other to: Remove weapons (e.g., guns, rifles, knives), all items previously/currently identified as safety concern.   Remove drugs/medications (over-the-counter, prescriptions, illicit drugs), all items previously/currently identified as a safety concern.  The family member/significant other verbalizes understanding of the suicide prevention education information provided.  The family member/significant other agrees to remove the items of safety concern listed above.  Isabella Bowens 09/23/2023, 1:27 PM

## 2023-09-23 NOTE — Progress Notes (Signed)
Pt got peanut butter and jelly sandwich for lunch with juice after he refused served lunch. Compliant with Ativan 2 mg IM without issues.  Remains on 1:1 precaution as ordered with assigned MHT staff in attendance without falls or seizure activity to note at this time.

## 2023-09-23 NOTE — Progress Notes (Signed)
Pt visible in bathroom on commode on initial interactions X1 refusing to get out of bathroom when approached. Flooded his commode lots of tissues and paper towels upon rising up from it. Presents with blunted affect, avertive eye contact, however, he was logical on assessment with delayed but accurate responses. Denies SI, HI, AVH and pain when assessed. Picked at his breakfast tray but really did not eat "I want to warm it up" but he still did not eat. Tolerated medications and fluids well. Observed to be very restless / hyperactive, unable to sit still in bed or on bench in his room. Noted walking without his walker as ordered despite multiple prompts as well. Placed on 1:1 observation for safety (falls and seizures) as ordered without incident to note thus far.

## 2023-09-23 NOTE — BHH Counselor (Signed)
Adult Comprehensive Assessment  Patient ID: Brady Hoffman, male   DOB: Apr 08, 1966, 57 y.o.   MRN: [id number]  Information Source: Information source: Patient  Current Stressors:  Patient states their primary concerns and needs for treatment are:: " I don't remember but I was in a car when I am  " Patient states their goals for this hospitilization and ongoing recovery are:: Medication management Educational / Learning stressors: None reported Employment / Job issues: None reported Family Relationships: None reported Surveyor, quantity / Lack of resources (include bankruptcy): None reported Housing / Lack of housing: None reported Physical health (include injuries & life threatening diseases): " yes " but pt did not go into details Social relationships: None reported Substance abuse: None reported Bereavement / Loss: None reported  Living/Environment/Situation:  Living Arrangements: Group Home Living conditions (as described by patient or guardian): Pt stays in a house Who else lives in the home?: Pt lives with other in the group home How long has patient lived in current situation?: " 20 years " What is atmosphere in current home: Comfortable, Supportive  Family History:  Marital status: Single Are you sexually active?: No What is your sexual orientation?: DNA Has your sexual activity been affected by drugs, alcohol, medication, or emotional stress?: DNA Does patient have children?: No  Childhood History:  By whom was/is the patient raised?: Mother Description of patient's relationship with caregiver when they were a child: " fine " Patient's description of current relationship with people who raised him/her: " it is better now " How were you disciplined when you got in trouble as a child/adolescent?: " yelled at " Does patient have siblings?: Yes Number of Siblings: 1 Description of patient's current relationship with siblings: " not close at all " Did patient suffer any  verbal/emotional/physical/sexual abuse as a child?: No Did patient suffer from severe childhood neglect?: No Has patient ever been sexually abused/assaulted/raped as an adolescent or adult?: No Was the patient ever a victim of a crime or a disaster?: No Witnessed domestic violence?: No Has patient been affected by domestic violence as an adult?: No  Education:  Highest grade of school patient has completed: Some college Currently a Consulting civil engineer?: No Learning disability?: No  Employment/Work Situation:   Employment Situation: On disability Why is Patient on Disability: mental health How Long has Patient Been on Disability: 15 years Patient's Job has Been Impacted by Current Illness: No What is the Longest Time Patient has Held a Job?: 1 year Where was the Patient Employed at that Time?: " [employer] working with horses " Has Patient ever Been in the [employer]?: No  Financial Resources:   Surveyor, quantity resources: [employer], [employer], Insurance claims handler Does patient have a Lawyer or guardian?: Yes Name of representative payee or guardian: Pt states that his mom Brady Hoffman is POA  Alcohol/Substance Abuse:   What has been your use of drugs/alcohol within the last 12 months?: Pt states that he drinks beer every 2 weeks If attempted suicide, did drugs/alcohol play a role in this?: No Alcohol/Substance Abuse Treatment Hx: Denies past history Has alcohol/substance abuse ever caused legal problems?: No  Social Support System:   Forensic psychologist System: None Describe Community Support System: " the government supports me " Type of faith/religion: " I keep that to myself " How does patient's faith help to cope with current illness?: None reported  Leisure/Recreation:      Strengths/Needs:   What is the patient's perception of their strengths?: " music and arts" Patient states they  can use these personal strengths during their treatment to contribute to their recovery: Listening  to music or doing arts Patient states these barriers may affect/interfere with their treatment: None reported Patient states these barriers may affect their return to the community: None reported Other important information patient would like considered in planning for their treatment: N/A  Discharge Plan:   Currently receiving community mental health services: Yes (From Whom) (Pt sees Doctor Akintayo for medication management) Patient states concerns and preferences for aftercare planning are: Pt states that he will like to continue with his psychiatrist Patient states they will know when they are safe and ready for discharge when: Pt did not say because he could not remember why he was here Does patient have access to transportation?: Yes Does patient have financial barriers related to discharge medications?: No Will patient be returning to same living situation after discharge?: Yes  Summary/Recommendations:   Summary and Recommendations (to be completed by the evaluator): Brady Hoffman is a 57 y/o male who states that he could not remember why he was admitted. Patient during assessment was sweating, antsy, and taping his finger tips together. Patient denied any stressors other than his physical health but did not go into details. Patient was very pleasant during assessment but was responding real slow to CSW questions or saying " what" to forgetting what was beign asked. Patient sees Brady Hoffman in [place] and the plans are for him to return back to his group home.While here, Brady Hoffman can benefit from crisis stabilization, medication management, therapeutic milieu, and referrals for services.   Brady Hoffman. 09/23/2023

## 2023-09-23 NOTE — BHH Group Notes (Signed)
Adult Psychoeducational Group Note  Date:  09/23/2023 Time:  10:02 AM  Group Topic/Focus:  Goals Group:   The focus of this group is to help patients establish daily goals to achieve during treatment and discuss how the patient can incorporate goal setting into their daily lives to aide in recovery.  Participation Level:  Did Not Attend  Participation Quality:    Affect:    Cognitive:    Insight:   Engagement in Group:    Modes of Intervention:    Additional Comments:    Sheran Lawless 09/23/2023, 10:02 AM

## 2023-09-23 NOTE — B[facility] Group Notes (Signed)
Pt did not attend wrap-up group   

## 2023-09-23 NOTE — Plan of Care (Addendum)
Problem: Coping: Goal: Ability to verbalize frustrations and anger appropriately will improve Outcome: Progressing   Problem: Health Behavior/Discharge Planning: Goal: Compliance with treatment plan for underlying cause of condition will improve Outcome: Progressing   Problem: Safety: Goal: Periods of time without injury will increase Outcome: Progressing   Observed with spontaneous speech and was more cooperative with verbal redirections when reassessed at 1450 post Ativan 2 mg IM.  Pt tolerated supper and fluids well this evening. Took scheduled evening medications. Denies concerns at this time. Remains on 1:1 precaution as ordered with assigned MHT staff in attendance without falls or seizure activity to note at this time.

## 2023-09-24 DIAGNOSIS — F251 Schizoaffective disorder, depressive type: Secondary | ICD-10-CM | POA: Diagnosis not present

## 2023-09-24 LAB — CBC WITH DIFFERENTIAL/PLATELET
Abs Immature Granulocytes: 0.01 10*3/uL (ref 0.00–0.07)
Basophils Absolute: 0 10*3/uL (ref 0.0–0.1)
Basophils Relative: 0 %
Eosinophils Absolute: 0.1 10*3/uL (ref 0.0–0.5)
Eosinophils Relative: 3 %
HCT: 35 % — ABNORMAL LOW (ref 39.0–52.0)
Hemoglobin: 11.8 g/dL — ABNORMAL LOW (ref 13.0–17.0)
Immature Granulocytes: 0 %
Lymphocytes Relative: 26 %
Lymphs Abs: 1.4 10*3/uL (ref 0.7–4.0)
MCH: 31.1 pg (ref 26.0–34.0)
MCHC: 33.7 g/dL (ref 30.0–36.0)
MCV: 92.3 fL (ref 80.0–100.0)
Monocytes Absolute: 0.5 10*3/uL (ref 0.1–1.0)
Monocytes Relative: 10 %
Neutro Abs: 3.2 10*3/uL (ref 1.7–7.7)
Neutrophils Relative %: 61 %
Platelets: 155 10*3/uL (ref 150–400)
RBC: 3.79 MIL/uL — ABNORMAL LOW (ref 4.22–5.81)
RDW: 12.3 % (ref 11.5–15.5)
WBC: 5.3 10*3/uL (ref 4.0–10.5)
nRBC: 0 % (ref 0.0–0.2)

## 2023-09-24 LAB — BASIC METABOLIC PANEL
Anion gap: 9 (ref 5–15)
BUN: 22 mg/dL — ABNORMAL HIGH (ref 6–20)
CO2: 24 mmol/L (ref 22–32)
Calcium: 8.3 mg/dL — ABNORMAL LOW (ref 8.9–10.3)
Chloride: 106 mmol/L (ref 98–111)
Creatinine, Ser: 1.05 mg/dL (ref 0.61–1.24)
GFR, Estimated: >60 mL/min (ref >60)
Glucose, Bld: 117 mg/dL — ABNORMAL HIGH (ref 70–99)
Potassium: 3.7 mmol/L (ref 3.5–5.1)
Sodium: 139 mmol/L (ref 135–145)

## 2023-09-24 LAB — TSH: TSH: 2.765 u[IU]/mL (ref 0.350–4.500)

## 2023-09-24 MED ORDER — LORAZEPAM 0.5 MG PO TABS
0.5000 mg | ORAL_TABLET | Freq: Three times a day (TID) | ORAL | Status: DC
  Administered 2023-09-24 – 2023-09-30 (×17): 0.5 mg via ORAL
  Filled 2023-09-24 (×17): qty 1

## 2023-09-24 NOTE — Progress Notes (Addendum)
[facility] MD Progress Note  09/24/2023 3:34 PM Dezmend Goralczyk  MRN:  [id number]  Subjective:    Thoren Koby is a 57 year old Caucasian male with prior psychiatric history significant for schizoaffective disorder, seizure disorder, developmental delay, alcohol use disorder, who presents voluntarily [facility] from [facility] health ED at [facility] for evaluation for his mental health illness and frequent incidence of falls.  After medical evaluation/stabilization & clearance, he was transferred to the [facility] for further psychiatric evaluation & treatments.   Yesterday the psychiatry team made the following recommendations: Continue Clozapine tablets 300 mg p.o. twice daily for psychosis Continue Prozac 40 mg p.o. 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  According to the one-to-one sitter, the patient has been doing better, taking his medications, talking more clearly, he eating every meal that is brought to him, and only going to the bathroom once, and then returning.  Overall this seems to be a very significant improvement in the patient's functioning.  As evidenced by him not obsessing about having to use the restroom, spending hours in the restroom, there is an improvement of his speech/mutism, oral intake, negativism, and withdrawal.  They have after symptoms assessed the patient again and he did awaken for this interview.  He is more interactive, his thoughts were more logical and linear.  His speech is more clear.  There is almost no appreciable tremor, either resting, postural, or action.  He is A and O x 3.  Denies any AH or VH.  Denies any SI or HI.  Reports that mood is less anxious.  Denies any side effects to current psychiatric medications.  Review of symptoms, specific for clozapine: Malaise/Sedation: Yes, this could be due to Ativan Chest pain: Denies Shortness of  breath:Denies Exertional capacity:Denies Tachycardia:Denies Cough:Denies Sore Throat:Denies Fever:Denies Orthostatic hypotension (dizziness with standing): Reports dizzy with standing Hypersalivation: Denies Constipation:Denies Symptoms of GERD:Denies Nausea:Denies Nocturnal enuresis:Denies    Principal Problem: Schizoaffective disorder, depressive type (HCC) Diagnosis: Principal Problem:   Schizoaffective disorder, depressive type (HCC)  Total Time spent with patient: 10 minutes  Past Psychiatric History:  Previous Psych Diagnoses: Panic disorder with agoraphobia, history of seizures, history of alcohol use disorder, schizoaffective disorder Prior inpatient treatment: Was admitted to [facility] years [facility] Current/prior outpatient treatment: Yes with Dr. Jannifer Franklin Prior rehab hx: Denies Psychotherapy hx: Yes History of suicide: Denies History of homicide or aggression: Denies Psychiatric medication history: Patient has been on trial Seroquel, Prolixin, Effexor, and Haldol Psychiatric medication compliance history: Compliance Neuromodulation history: Denies Current Psychiatrist: Dr. Jannifer Franklin Current therapist: Dr. 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    Pervasive developmental disorder    Schizoaffective disorder    Seizures (HCC)     Past Surgical History:  Procedure Laterality Date   ESOPHAGOGASTRODUODENOSCOPY  11/01/2011   Procedure: ESOPHAGOGASTRODUODENOSCOPY (EGD);  Surgeon: Theda Belfast;  Location: [facility] ENDOSCOPY;  Service: Endoscopy;  Laterality: N/A;   MOLE REMOVAL     WISDOM TOOTH EXTRACTION     Family History:  Family History  Adopted: Yes   Family Psychiatric  History: See H&P   Social History:  Social History  Substance and Sexual Activity   Alcohol Use No     Social History   Substance and Sexual Activity  Drug Use No    Social History   Socioeconomic History   Marital status: Single    Spouse name: Not on file   Number of children: 0   Years of education: College   Highest education level: Not on file  Occupational History    Employer: OTHER  Tobacco Use   Smoking status: Never   Smokeless tobacco: Never  Substance and Sexual Activity   Alcohol use: No   Drug use: No   Sexual activity: Never  Other Topics Concern   Not on file  Social History Narrative   Patient lives in [facility].   Right handed.   Education  Two years of college.Caffeine Use: 2 cups daily  Coke cola.    Social Determinants of Health   Financial Resource Strain: Not on file  Food Insecurity: No Food Insecurity (09/21/2023)   Hunger Vital Sign    Worried About Running Out of Food in the Last Year: Never true    Ran Out of Food in the Last Year: Never true  Transportation Needs: No Transportation Needs (09/21/2023)   PRAPARE - Administrator, Civil Service (Medical): No    Lack of Transportation (Non-Medical): No  Physical Activity: Not on file  Stress: Not on file  Social Connections: Not on file   Additional Social History:                           Current Medications: Current Facility-Administered Medications  Medication Dose Route Frequency Provider Last Rate Last Admin   acetaminophen (TYLENOL) tablet 650 mg  650 mg Oral Q6H PRN Dahlia Byes C, NP       alum & mag hydroxide-simeth (MAALOX/MYLANTA) 200-200-20 MG/5ML suspension 30 mL  30 mL Oral Q4H PRN Dahlia Byes C, NP       aspirin chewable tablet 81 mg  81 mg Oral Daily Onuoha, Josephine C, NP   81 mg at 09/24/23 0854   atorvastatin (LIPITOR) tablet 20 mg  20 mg Oral QHS Onuoha, Josephine C, NP   20 mg at 09/23/23 2055   cloZAPine (CLOZARIL) tablet 300 mg  300 mg Oral Q12H Karla Vines, Harrold Donath, MD   300 mg at 09/24/23 0853    diphenhydrAMINE (BENADRYL) capsule 50 mg  50 mg Oral TID PRN Earney Navy, NP       Or   diphenhydrAMINE (BENADRYL) injection 50 mg  50 mg Intramuscular TID PRN Dahlia Byes C, NP       docusate sodium (COLACE) capsule 100 mg  100 mg Oral Daily Dahlia Byes C, NP   100 mg at 09/24/23 0853   FLUoxetine (PROZAC) capsule 40 mg  40 mg Oral Daily Dahlia Byes C, NP   40 mg at 09/24/23 4098   haloperidol (HALDOL) tablet 5 mg  5 mg Oral TID PRN Dahlia Byes C, NP       Or   haloperidol lactate (HALDOL) injection 5 mg  5 mg Intramuscular TID PRN Dahlia Byes C, NP       influenza vac split trivalent PF (FLULAVAL) injection 0.5 mL  0.5 mL Intramuscular Tomorrow-1000 Ezell Poke, Harrold Donath, MD       levETIRAcetam (KEPPRA) tablet 500 mg  500 mg Oral BID Dahlia Byes C, NP   500 mg at 09/24/23 0854   levothyroxine (SYNTHROID) tablet  50 mcg  50 mcg Oral Q0600 Dahlia Byes C, NP   50 mcg at 09/24/23 1610   LORazepam (ATIVAN) tablet 2 mg  2 mg Oral TID PRN Earney Navy, NP       Or   LORazepam (ATIVAN) injection 2 mg  2 mg Intramuscular TID PRN Earney Navy, NP       LORazepam (ATIVAN) tablet 0.5 mg  0.5 mg Oral Q8H PRN Dahlia Byes C, NP   0.5 mg at 09/21/23 2131   LORazepam (ATIVAN) tablet 0.5 mg  0.5 mg Oral Q8H Phung Kotas, MD       magnesium hydroxide (MILK OF MAGNESIA) suspension 30 mL  30 mL Oral Daily PRN Earney Navy, NP       pantoprazole (PROTONIX) EC tablet 80 mg  80 mg Oral Daily Dahlia Byes C, NP   80 mg at 09/24/23 0853   sulfacetamide (BLEPH-10) 10 % ophthalmic solution 1 drop  1 drop Both Eyes Q3H while awake Lacresha Fusilier, Harrold Donath, MD   1 drop at 09/24/23 1200    Lab Results:  Results for orders placed or performed during the hospital encounter of 09/21/23 (from the past 48 hour(s))  Hemoglobin A1c     Status: Abnormal   Collection Time: 09/23/23  6:39 AM  Result Value Ref Range   Hgb A1c MFr Bld 5.7 (H) 4.8 - 5.6 %     Comment: (NOTE) Pre diabetes:          5.7%-6.4%  Diabetes:              >6.4%  Glycemic control for   <7.0% adults with diabetes    Mean Plasma Glucose 116.89 mg/dL    Comment: Performed at [facility], [address], [place], [place] [address]  Basic metabolic panel     Status: Abnormal   Collection Time: 09/24/23  6:52 AM  Result Value Ref Range   Sodium 139 135 - 145 mmol/L   Potassium 3.7 3.5 - 5.1 mmol/L   Chloride 106 98 - 111 mmol/L   CO2 24 22 - 32 mmol/L   Glucose, Bld 117 (H) 70 - 99 mg/dL    Comment: Glucose reference range applies only to samples taken after fasting for at least 8 hours.   BUN 22 (H) 6 - 20 mg/dL   Creatinine, Ser 4.09 0.61 - 1.24 mg/dL   Calcium 8.3 (L) 8.9 - 10.3 mg/dL   GFR, Estimated >81 >19 mL/min    Comment: (NOTE) Calculated using the CKD-EPI Creatinine Equation (2021)    Anion gap 9 5 - 15    Comment: Performed at [facility], [address], [place], [place] [address]  TSH     Status: None   Collection Time: 09/24/23  6:52 AM  Result Value Ref Range   TSH 2.765 0.350 - 4.500 uIU/mL    Comment: Performed by a 3rd Generation assay with a functional sensitivity of <=0.01 uIU/mL. Performed at [facility], [address], [place], [place] [address]   CBC with Differential/Platelet     Status: Abnormal   Collection Time: 09/24/23  6:52 AM  Result Value Ref Range   WBC 5.3 4.0 - 10.5 K/uL   RBC 3.79 (L) 4.22 - 5.81 MIL/uL   Hemoglobin 11.8 (L) 13.0 - 17.0 g/dL   HCT 30.8 (L) 65.7 - 84.6 %   MCV 92.3 80.0 - 100.0 fL   MCH 31.1 26.0 - 34.0 pg  MCHC 33.7 30.0 - 36.0 g/dL   RDW 11.9 14.7 - 82.9 %   Platelets 155 150 - 400 K/uL   nRBC 0.0 0.0 - 0.2 %   Neutrophils Relative % 61 %   Neutro Abs 3.2 1.7 - 7.7 K/uL   Lymphocytes Relative 26 %   Lymphs Abs 1.4 0.7 - 4.0 K/uL   Monocytes Relative 10 %   Monocytes Absolute 0.5 0.1 - 1.0 K/uL   Eosinophils Relative 3 %   Eosinophils Absolute  0.1 0.0 - 0.5 K/uL   Basophils Relative 0 %   Basophils Absolute 0.0 0.0 - 0.1 K/uL   Immature Granulocytes 0 %   Abs Immature Granulocytes 0.01 0.00 - 0.07 K/uL    Comment: Performed at [facility], [address] [place], [place] [address]    Blood Alcohol level:  Lab Results  Component Value Date   [facility] <10 09/19/2023   ETH <10 09/28/2019    Metabolic Disorder Labs: Lab Results  Component Value Date   HGBA1C 5.7 (H) 09/23/2023   MPG 116.89 09/23/2023   MPG 114 02/25/2014   No results found for: "PROLACTIN" Lab Results  Component Value Date   CHOL 144 07/03/2023   TRIG 152 (H) 07/03/2023   HDL 37 (L) 07/03/2023   CHOLHDL 3.9 07/03/2023   VLDL 28 10/20/2009   LDLCALC 82 07/03/2023   LDLCALC 59 10/02/2021    Physical Findings: AIMS:  , ,  ,  ,    CIWA:    COWS:     Musculoskeletal: Strength & Muscle Tone: increased Gait & Station: unsteady Patient leans: N/A  Psychiatric Specialty Exam: Patient is sleeping when I wake up in my interview.  His overall appearance is lying in bed, appropriately covered.  No eye contact.  No speech due to sleeping.  Mood is not able to be conveyed due to sleeping.  Affect not able to be evaluated.  Thought process and thought content not able to be evaluated due to sleeping.  Unable to comment on SI or HI.   Physical Exam: Physical Exam Constitutional:      General: He is not in acute distress.    Appearance: He is normal weight. He is not toxic-appearing.  Pulmonary:     Effort: Pulmonary effort is normal.  Neurological:     Mental Status: He is alert.     Motor: Weakness present.     Gait: Gait abnormal.    Review of Systems  Neurological:  Positive for tremors.  Psychiatric/Behavioral:  Negative for depression, hallucinations, memory loss, substance abuse and suicidal ideas. The patient is nervous/anxious. The patient does not have insomnia.    Blood pressure 139/74, pulse 69, temperature 97.7 F  (36.5 C), temperature source Oral, resp. rate 18, height 6\' 3"  (1.905 m), weight 94.8 kg, SpO2 97%. Body mass index is 26.12 kg/m.   Treatment Plan Summary: Daily contact with patient to assess and evaluate symptoms and progress in treatment and Medication management     ASSESSMENT:  Diagnoses / Active Problems: Schizoaffective disorder, depressive type, with catatonic features  GAD with panic attacks and agoraphobia Alcohol use d/o - ?in remission? Developmental delay    PLAN: Safety and Monitoring:  --  Voluntary admission to inpatient psychiatric unit for safety, stabilization and treatment  -- Daily contact with patient to assess and evaluate symptoms and progress in treatment  -- Patient's case to be discussed in multi-disciplinary team meeting  -- Observation Level : q15 minute checks  --  Vital signs:  q12 hours  -- Precautions: suicide, elopement, and assault  2. Psychiatric Diagnoses and Treatment:    -Decrease ativan from 1 mg q8H -> to 0.5 mg q8H - for catatonic features of presumably psychosis (could also be due to substance withdrawal or underlying medical or neurological condition).   - We will consider MRI, it is also a concern the pt had some neurological change (falls, unsteadiness, that accompanied worsening psychosis/ocd - r/o previous CVA - ie - vascular process superimposed on severe long term schizophrenia )   -Continue Clozapine tablets 300 mg p.o. twice daily for psychosis (re-started this morning 10-22)  -Continue Prozac 40 mg p.o. daily for depression  -Continue Ativan tablet 0.5 mg p.o. every 8 hours as needed for anxiety   --  The risks/benefits/side-effects/alternatives to this medication were discussed in detail with the patient and time was given for questions. The patient consents to medication trial.    -- Metabolic profile and EKG monitoring obtained while on an atypical antipsychotic (BMI: Lipid Panel: HbgA1c: QTc:) 505  -- Encouraged  patient to participate in unit milieu and in scheduled group therapies        3. Medical Issues Being Addressed:    Continue aspirin chewable tablet 81 mg p.o. daily Continue Lipitor tablets 20 mg p.o. daily for hyperlipidemia Continue Colace capsule 100 mg p.o. daily for constipation Continue Keppra tablet 500 mg p.o. 2 times daily for seizures prevention Continue Synthroid tablet 50 mcg p.o. daily for hypothyroidism Continue Protonix EC tablet 80 mg p.o. daily for GERD   4. Discharge Planning:   -- Social work and case management to assist with discharge planning and identification of hospital follow-up needs prior to discharge  -- Estimated LOS: 5-7 days  -- Discharge Concerns: Need to establish a safety plan; Medication compliance and effectiveness  -- Discharge Goals: Return home with outpatient referrals for mental health follow-up including medication management/psychotherapy  Phineas Inches, MD 09/24/2023, 3:34 PM  Total Time Spent in Direct Patient Care:  I personally spent 35 minutes on the unit in direct patient care. The direct patient care time included face-to-face time with the patient, reviewing the patient's chart, communicating with other professionals, and coordinating care. Greater than 50% of this time was spent in counseling or coordinating care with the patient regarding goals of hospitalization, psycho-education, and discharge planning needs.   Phineas Inches, MD Psychiatrist

## 2023-09-24 NOTE — Progress Notes (Signed)
Nursing 1:1 note D:Pt observed sitting in bed with eyes open. RR even and unlabored. No distress noted. A: 1:1 observation continues for safety  R: pt remains safe  

## 2023-09-24 NOTE — Progress Notes (Signed)
Nursing 1:1 note D:Pt observed sleeping in bed with eyes closed. RR even and unlabored. No distress noted. A: 1:1 observation continues for safety  R: pt remains safe  

## 2023-09-24 NOTE — Progress Notes (Signed)
Pt asleep in bed during safety rounds. Respirations noted and unlabored. Tolerated lunch and fluids well. Safety maintained on 1:1 observation as ordered without incident to note thus far. Assigned staff in attendance at all times.

## 2023-09-24 NOTE — Progress Notes (Signed)
Pt awake in bed eating dinner and conversing with staff. Took his scheduled Keppra this evening without issues. Continued support, encouragement and reassurance offered. 1:1 maintained in milieu for safety without incident.

## 2023-09-24 NOTE — Progress Notes (Addendum)
Pt awake in bed on initial interactions. Reports he slept well last night with good appetite. Pt remains guarded, isolative to room but is pleasant on interactions with avertive eye contact. Denies SI, HI, AVH, pain and anxiety, however, he did endorse being sadness and  depressed "I've been lot of trouble to everyone". Rated his depression 6/10 and anxiety 0/10 but observed to be restless, fidgety in bed in brief intervals of standing and sitting. Remains medication compliant, denies adverse drug reactions. Tolerates breakfast and fluids well. Assisted with toileting, ADLs and bed linen changed by assigned staff. 1:1 observation maintained for safety without falls or seizure activity to note thus far.

## 2023-09-24 NOTE — BHH Group Notes (Signed)
The focus of this group is to help patients establish daily goals to achieve during treatment and discuss how the patient can incorporate goal setting into their daily lives to aide in recovery.  Pt did not attend group 

## 2023-09-24 NOTE — Group Note (Signed)
Date:  09/24/2023 Time:  9:27 PM  Group Topic/Focus:  Wrap-Up Group:   The focus of this group is to help patients review their daily goal of treatment and discuss progress on daily workbooks.    Participation Level:  Active  Participation Quality:  Appropriate  Affect:  Appropriate  Cognitive:  Appropriate  Insight: Appropriate  Engagement in Group:  Engaged  Modes of Intervention:  Education and Exploration  Additional Comments:  Patient attended and participated in group tonight. He reports that highlight of his day was when his mother visited.  Lita Mains [facility] 09/24/2023, 9:27 PM

## 2023-09-25 ENCOUNTER — Inpatient Hospital Stay (HOSPITAL_COMMUNITY): Payer: Medicare Other

## 2023-09-25 DIAGNOSIS — F251 Schizoaffective disorder, depressive type: Secondary | ICD-10-CM | POA: Diagnosis not present

## 2023-09-25 LAB — CLOZAPINE (CLOZARIL)
Clozapine Lvl: 1723 ng/mL — ABNORMAL HIGH (ref 350–600)
NorClozapine: 535 ng/mL
Total(Cloz+Norcloz): 2258 ng/mL

## 2023-09-25 LAB — LEVETIRACETAM LEVEL: Levetiracetam Lvl: 6.5 ug/mL — ABNORMAL LOW (ref 10.0–40.0)

## 2023-09-25 MED ORDER — CLOZAPINE 100 MG PO TABS
200.0000 mg | ORAL_TABLET | Freq: Every day | ORAL | Status: DC
  Administered 2023-09-26: 200 mg via ORAL
  Filled 2023-09-25 (×3): qty 2

## 2023-09-25 MED ORDER — FLUOXETINE HCL 20 MG PO CAPS
60.0000 mg | ORAL_CAPSULE | Freq: Every day | ORAL | Status: DC
  Administered 2023-09-26 – 2023-10-07 (×12): 60 mg via ORAL
  Filled 2023-09-25 (×14): qty 3

## 2023-09-25 MED ORDER — CLOZAPINE 100 MG PO TABS
300.0000 mg | ORAL_TABLET | Freq: Every day | ORAL | Status: DC
  Administered 2023-09-25: 300 mg via ORAL
  Filled 2023-09-25 (×3): qty 3

## 2023-09-25 MED ORDER — GADOBUTROL 1 MMOL/ML IV SOLN
6.0000 mL | Freq: Once | INTRAVENOUS | Status: AC | PRN
  Administered 2023-09-25: 6 mL via INTRAVENOUS

## 2023-09-25 NOTE — Progress Notes (Signed)
[facility] LCSW Progress Note   09/25/2023    1:36 PM  Brady Hoffman      Type of Note: Visit with Mom   CSW met with patient mom in the lobby and accompanied her on the unit so she could visit her son. Mom during visit was asking patient how he was feeling and patient stated that he felt fine . Patient was laying in bed while his mom was visiting and after her visit she requested to speak with the doctor for a few and after speaking with the doctor she asked to go back to say goodbye to patient. CSW then walked mom out and she had left.     Signed:   Jacob Moores, MSW, [facility] 09/25/2023 1:36 PM

## 2023-09-25 NOTE — Progress Notes (Signed)
1:1 Note: Patient off the unit to [facility] for MRI appointment.

## 2023-09-25 NOTE — Plan of Care (Signed)
  Problem: Education: Goal: Knowledge of [facility] Education information/materials will improve Outcome: Progressing Goal: Emotional status will improve Outcome: Progressing Goal: Mental status will improve Outcome: Progressing Goal: Verbalization of understanding the information provided will improve Outcome: Progressing   Problem: Activity: Goal: Interest or engagement in activities will improve Outcome: Progressing Goal: Sleeping patterns will improve Outcome: Progressing   Problem: Coping: Goal: Ability to verbalize frustrations and anger appropriately will improve Outcome: Progressing Goal: Ability to demonstrate self-control will improve Outcome: Progressing   Problem: Health Behavior/Discharge Planning: Goal: Identification of resources available to assist in meeting health care needs will improve Outcome: Progressing Goal: Compliance with treatment plan for underlying cause of condition will improve Outcome: Progressing   Problem: Physical Regulation: Goal: Ability to maintain clinical measurements within normal limits will improve Outcome: Progressing   Problem: Safety: Goal: Periods of time without injury will increase Outcome: Progressing  Pt has been mostly in bed. Pt did go in day room with encouragement and showered with encouragement. Pt denies SI/HI/AVH. Pt reports feeling tired. Pts mother visited today. Pt remains 1:1 with staff for safety.

## 2023-09-25 NOTE — Progress Notes (Signed)
   09/25/23 2130  Psych Admission Type (Psych Patients Only)  Admission Status Voluntary  Psychosocial Assessment  Patient Complaints None  Eye Contact Avertive  Facial Expression Flat  Affect Appropriate to circumstance  Speech Soft  Interaction Cautious;Guarded  Motor Activity Restless;Fidgety  Appearance/Hygiene Disheveled  Behavior Characteristics Cooperative  Mood Pleasant  Aggressive Behavior  Effect No apparent injury  Thought Administrator, sports thinking  Content WDL  Delusions WDL  Perception WDL  Hallucination None reported or observed  Judgment Limited  Confusion WDL  Danger to Self  Current suicidal ideation? Denies

## 2023-09-25 NOTE — Group Note (Signed)
Date:  09/25/2023 Time:  9:17 PM  Group Topic/Focus:  Wrap-Up Group:   The focus of this group is to help patients review their daily goal of treatment and discuss progress on daily workbooks.    Participation Level:  Did Not Attend   Scot Dock 09/25/2023, 9:17 PM

## 2023-09-25 NOTE — Progress Notes (Signed)
   09/25/23 0030  Psych Admission Type (Psych Patients Only)  Admission Status Voluntary  Psychosocial Assessment  Patient Complaints None  Eye Contact Avertive  Facial Expression Flat  Affect Appropriate to circumstance  Speech Soft  Interaction Cautious;Guarded  Motor Activity Restless;Fidgety  Appearance/Hygiene Disheveled  Behavior Characteristics Cooperative  Mood Preoccupied;Anxious  Aggressive Behavior  Effect No apparent injury  Thought Process  Coherency Concrete thinking  Content WDL  Delusions WDL  Perception WDL  Hallucination None reported or observed  Judgment Limited  Confusion WDL  Danger to Self  Current suicidal ideation? Denies

## 2023-09-25 NOTE — Progress Notes (Signed)
Nursing 1:1 note D:Pt observed sleeping in bed with eyes closed. RR even and unlabored. No distress noted. A: 1:1 observation continues for safety  R: pt remains safe  

## 2023-09-25 NOTE — Progress Notes (Signed)
1:1 Note: Patient maintained on constant supervision for safety.  Patient up and ambulating with a walker on the unit.  Medications given as prescribed. No distress noted.  Patient is safe on the unit with supervision.

## 2023-09-25 NOTE — Plan of Care (Signed)
  Problem: Education: Goal: Emotional status will improve Outcome: Progressing Goal: Mental status will improve Outcome: Progressing   Problem: Activity: Goal: Sleeping patterns will improve Outcome: Progressing   Problem: Coping: Goal: Ability to demonstrate self-control will improve Outcome: Progressing   Problem: Activity: Goal: Interest or engagement in activities will improve Outcome: Not Progressing

## 2023-09-25 NOTE — Progress Notes (Signed)
[facility]U LCSW Progress Note   09/25/2023    9:32 AM  Brady Hoffman      Type of Note: Call with Mom    CSW reached out to mom on her cell phone and spoke with her briefly . Mom stated that she was on her way up here to " lay eyes" on her son and speak with the doctor and CSW. CSW explained to mom that the group home has already been contacted and as of now once patient is stable his follow up aftercare will be with his psychiatrist. Mom understood but stated, " I hope to see you when I come because I am not good with technology and I have questions for the doctor ". CSW will continue to assist.     Signed:   Jacob Moores, MSW, [facility] 09/25/2023 9:32 AM

## 2023-09-25 NOTE — [facility] Group Notes (Signed)
[facility] Group Notes:  (Nursing/MHT/Case Management/Adjunct)  Date:  09/25/2023  Time:  10:17 AM  Type of Therapy:  Psychoeducational Skills  Participation Level:  Did Not Attend  Brady Hoffman 09/25/2023, 10:17 AM

## 2023-09-25 NOTE — Progress Notes (Signed)
[facility] MD Progress Note  09/25/2023 2:31 PM Brady Hoffman  MRN:  [id number]  Subjective:    Brady Hoffman is a 57 year old Caucasian male with prior psychiatric history significant for schizoaffective disorder, seizure disorder, developmental delay, alcohol use disorder, who presents voluntarily [facility] from [facility] health ED at [facility] for evaluation for his mental health illness and frequent incidence of falls. After medical evaluation/stabilization & clearance, he was transferred to the [facility] for further psychiatric evaluation & treatments.   I talked in person with his mother today during visitation.  She states that was a steady decline medically (falls, dizziness, weakness) over the last few months. And psychiatrically he has more repetitive movements, obsession over bathrooms and germs has been more too. She is concerned he is overmedicated and also depressed.  The pt is speaking more, moving more easily with walking, less tremor, eating more. Using walker, more redirectable, spending much less time in the bathroom which is an improvement.  He otherwise is much less psychotic, disorganized. Denies AH, VH, SI, HI.   I discussed with pt and mother to incr prozac for depression and decl clozapine due to dizziness, weakness, overall blunting.   Review of symptoms, specific for clozapine: Malaise/Sedation: Yes, this could be due to Ativan Chest pain: Denies Shortness of breath:Denies Exertional capacity:Denies Tachycardia:Denies Cough:Denies Sore Throat:Denies Fever:Denies Orthostatic hypotension (dizziness with standing): Reports dizzy with standing Hypersalivation: Denies Constipation:Denies Symptoms of GERD:Denies Nausea:Denies Nocturnal enuresis:Denies    Principal Problem: Schizoaffective disorder, depressive type (HCC) Diagnosis: Principal Problem:   Schizoaffective disorder, depressive type (HCC)  Total Time spent with patient: 20 minutes  Past Psychiatric  History:  Previous Psych Diagnoses: Panic disorder with agoraphobia, history of seizures, history of alcohol use disorder, schizoaffective disorder Prior inpatient treatment: Was admitted to [facility] 5 years ago Current/prior outpatient treatment: Yes with Dr. Jannifer Franklin Prior rehab hx: Denies Psychotherapy hx: Yes History of suicide: Denies History of homicide or aggression: Denies Psychiatric medication history: Patient has been on trial Seroquel, Prolixin, Effexor, and Haldol Psychiatric medication compliance history: Compliance Neuromodulation history: Denies Current Psychiatrist: Dr. Jannifer Franklin Current therapist: Dr. 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    Pervasive developmental disorder    Schizoaffective disorder    Seizures (HCC)     Past Surgical History:  Procedure Laterality Date   ESOPHAGOGASTRODUODENOSCOPY  11/01/2011   Procedure: ESOPHAGOGASTRODUODENOSCOPY (EGD);  Surgeon: Theda Belfast;  Location: [facility] ENDOSCOPY;  Service: Endoscopy;  Laterality: N/A;   MOLE REMOVAL     WISDOM TOOTH EXTRACTION     Family History:  Family History  Adopted: Yes   Family Psychiatric  History: See H&P   Social History:  Social History   Substance and Sexual Activity  Alcohol Use No     Social History   Substance and Sexual Activity  Drug Use No    Social History   Socioeconomic History   Marital status: Single    Spouse name: Not on file   Number of children: 0   Years of education: College   Highest education level: Not on file  Occupational History    Employer: OTHER  Tobacco Use   Smoking status: Never   Smokeless tobacco: Never  Substance and Sexual Activity  Alcohol use: No   Drug use: No   Sexual activity: Never  Other Topics Concern   Not on file   Social History Narrative   Patient lives in [facility].   Right handed.   Education  Two years of college.Caffeine Use: 2 cups daily  Coke cola.    Social Determinants of Health   Financial Resource Strain: Not on file  Food Insecurity: No Food Insecurity (09/21/2023)   Hunger Vital Sign    Worried About Running Out of Food in the Last Year: Never true    Ran Out of Food in the Last Year: Never true  Transportation Needs: No Transportation Needs (09/21/2023)   PRAPARE - Administrator, Civil Service (Medical): No    Lack of Transportation (Non-Medical): No  Physical Activity: Not on file  Stress: Not on file  Social Connections: Not on file   Additional Social History:                           Current Medications: Current Facility-Administered Medications  Medication Dose Route Frequency Provider Last Rate Last Admin   acetaminophen (TYLENOL) tablet 650 mg  650 mg Oral Q6H PRN Dahlia Byes C, NP       alum & mag hydroxide-simeth (MAALOX/MYLANTA) 200-200-20 MG/5ML suspension 30 mL  30 mL Oral Q4H PRN Dahlia Byes C, NP       aspirin chewable tablet 81 mg  81 mg Oral Daily Dahlia Byes C, NP   81 mg at 09/25/23 0836   atorvastatin (LIPITOR) tablet 20 mg  20 mg Oral QHS Dahlia Byes C, NP   20 mg at 09/24/23 2038   [START ON 09/26/2023] cloZAPine (CLOZARIL) tablet 200 mg  200 mg Oral Daily Marcella Charlson, MD       cloZAPine (CLOZARIL) tablet 300 mg  300 mg Oral QHS Jeanny Rymer, MD       diphenhydrAMINE (BENADRYL) capsule 50 mg  50 mg Oral TID PRN Earney Navy, NP       Or   diphenhydrAMINE (BENADRYL) injection 50 mg  50 mg Intramuscular TID PRN Dahlia Byes C, NP       docusate sodium (COLACE) capsule 100 mg  100 mg Oral Daily Welford Roche, Josephine C, NP   100 mg at 09/25/23 0836   [START ON 09/26/2023] FLUoxetine (PROZAC) capsule 60 mg  60 mg Oral Daily Timtohy Broski, MD       haloperidol (HALDOL)  tablet 5 mg  5 mg Oral TID PRN Dahlia Byes C, NP       Or   haloperidol lactate (HALDOL) injection 5 mg  5 mg Intramuscular TID PRN Dahlia Byes C, NP       influenza vac split trivalent PF (FLULAVAL) injection 0.5 mL  0.5 mL Intramuscular Tomorrow-1000 Lizett Chowning, Harrold Donath, MD       levETIRAcetam (KEPPRA) tablet 500 mg  500 mg Oral BID Dahlia Byes C, NP   500 mg at 09/25/23 0836   levothyroxine (SYNTHROID) tablet 50 mcg  50 mcg Oral Q0600 Dahlia Byes C, NP   50 mcg at 09/25/23 0631   LORazepam (ATIVAN) tablet 2 mg  2 mg Oral TID PRN Earney Navy, NP       Or   LORazepam (ATIVAN) injection 2 mg  2 mg Intramuscular TID PRN Dahlia Byes C, NP       LORazepam (ATIVAN) tablet 0.5 mg  0.5 mg Oral Q8H PRN  Dahlia Byes C, NP   0.5 mg at 09/21/23 2131   LORazepam (ATIVAN) tablet 0.5 mg  0.5 mg Oral Q8H Sladen Plancarte, MD   0.5 mg at 09/25/23 1306   magnesium hydroxide (MILK OF MAGNESIA) suspension 30 mL  30 mL Oral Daily PRN Earney Navy, NP       pantoprazole (PROTONIX) EC tablet 80 mg  80 mg Oral Daily Dahlia Byes C, NP   80 mg at 09/25/23 0836   sulfacetamide (BLEPH-10) 10 % ophthalmic solution 1 drop  1 drop Both Eyes Q3H while awake Hiromi Knodel, Harrold Donath, MD   1 drop at 09/25/23 1306    Lab Results:  Results for orders placed or performed during the hospital encounter of 09/21/23 (from the past 48 hour(s))  Basic metabolic panel     Status: Abnormal   Collection Time: 09/24/23  6:52 AM  Result Value Ref Range   Sodium 139 135 - 145 mmol/L   Potassium 3.7 3.5 - 5.1 mmol/L   Chloride 106 98 - 111 mmol/L   CO2 24 22 - 32 mmol/L   Glucose, Bld 117 (H) 70 - 99 mg/dL    Comment: Glucose reference range applies only to samples taken after fasting for at least 8 hours.   BUN 22 (H) 6 - 20 mg/dL   Creatinine, Ser 1.61 0.61 - 1.24 mg/dL   Calcium 8.3 (L) 8.9 - 10.3 mg/dL   GFR, Estimated >09 >60 mL/min    Comment: (NOTE) Calculated using the  CKD-EPI Creatinine Equation (2021)    Anion gap 9 5 - 15    Comment: Performed at [facility], [address] [place], [place] [address]  TSH     Status: None   Collection Time: 09/24/23  6:52 AM  Result Value Ref Range   TSH 2.765 0.350 - 4.500 uIU/mL    Comment: Performed by a 3rd Generation assay with a functional sensitivity of <=0.01 uIU/mL. Performed at [facility], [address] [place], [place] [address]   CBC with Differential/Platelet     Status: Abnormal   Collection Time: 09/24/23  6:52 AM  Result Value Ref Range   WBC 5.3 4.0 - 10.5 K/uL   RBC 3.79 (L) 4.22 - 5.81 MIL/uL   Hemoglobin 11.8 (L) 13.0 - 17.0 g/dL   HCT 47.8 (L) 29.5 - 62.1 %   MCV 92.3 80.0 - 100.0 fL   MCH 31.1 26.0 - 34.0 pg   MCHC 33.7 30.0 - 36.0 g/dL   RDW 30.8 65.7 - 84.6 %   Platelets 155 150 - 400 K/uL   nRBC 0.0 0.0 - 0.2 %   Neutrophils Relative % 61 %   Neutro Abs 3.2 1.7 - 7.7 K/uL   Lymphocytes Relative 26 %   Lymphs Abs 1.4 0.7 - 4.0 K/uL   Monocytes Relative 10 %   Monocytes Absolute 0.5 0.1 - 1.0 K/uL   Eosinophils Relative 3 %   Eosinophils Absolute 0.1 0.0 - 0.5 K/uL   Basophils Relative 0 %   Basophils Absolute 0.0 0.0 - 0.1 K/uL   Immature Granulocytes 0 %   Abs Immature Granulocytes 0.01 0.00 - 0.07 K/uL    Comment: Performed at [facility], [address], [place], [place] [address]    Blood Alcohol level:  Lab Results  Component Value Date   [facility] <10 09/19/2023   ETH <10 09/28/2019    Metabolic Disorder Labs: Lab Results  Component Value Date  HGBA1C 5.7 (H) 09/23/2023   MPG 116.89 09/23/2023   MPG 114 02/25/2014   No results found for: "PROLACTIN" Lab Results  Component Value Date   CHOL 144 07/03/2023   TRIG 152 (H) 07/03/2023   HDL 37 (L) 07/03/2023   CHOLHDL 3.9 07/03/2023   VLDL 28 10/20/2009   LDLCALC 82 07/03/2023   LDLCALC 59 10/02/2021    Physical Findings: AIMS:  , ,  ,  ,     CIWA:    COWS:     Musculoskeletal: Strength & Muscle Tone: abnormal Gait & Station: unsteady Patient leans: Front  Psychiatric Specialty Exam:  Presentation  General Appearance:  Bizarre; Disheveled  Eye Contact: Poor  Speech: Blocked; Slow  Speech Volume: Decreased  Handedness: Right   Mood and Affect  Mood: Anxious  Affect: Flat   Thought Process  Thought Processes: -- (halted)  Descriptions of Associations:Intact  Orientation:Full (Time, Place and Person)  Thought Content:Logical  History of Schizophrenia/Schizoaffective disorder:No data recorded Duration of Psychotic Symptoms:No data recorded Hallucinations:No data recorded Ideas of Reference:None  Suicidal Thoughts:No data recorded Homicidal Thoughts:No data recorded  Sensorium  Memory: Immediate Fair; Recent Fair; Remote Fair  Judgment: Intact  Insight: Lacking   Executive Functions  Concentration: Poor  Attention Span: Poor  Recall: Good  Fund of Knowledge: Fair  Language: Good   Psychomotor Activity  Psychomotor Activity:No data recorded  Assets  Assets: Communication Skills; Desire for Improvement; Housing; Physical Health; Resilience; Social Support   Sleep  Sleep:No data recorded   Physical Exam: Physical Exam Vitals reviewed.  Constitutional:      General: He is not in acute distress.    Appearance: He is not toxic-appearing.  Pulmonary:     Effort: Pulmonary effort is normal. No respiratory distress.  Neurological:     Mental Status: He is alert.     Motor: Weakness present.     Gait: Gait abnormal.    Review of Systems  Constitutional:  Negative for chills and fever.  Cardiovascular:  Negative for chest pain and palpitations.  Gastrointestinal:  Negative for constipation.  Neurological:  Positive for tremors. Negative for dizziness, tingling and headaches.  Psychiatric/Behavioral:  Positive for depression. Negative for hallucinations,  memory loss, substance abuse and suicidal ideas. The patient is nervous/anxious. The patient does not have insomnia.   All other systems reviewed and are negative.  Blood pressure (!) 130/90, pulse 94, temperature 97.9 F (36.6 C), temperature source Oral, resp. rate 20, height 6\' 3"  (1.905 m), weight 94.8 kg, SpO2 99%. Body mass index is 26.12 kg/m.   Treatment Plan Summary: Daily contact with patient to assess and evaluate symptoms and progress in treatment and Medication management   ASSESSMENT:   Diagnoses / Active Problems: Schizoaffective disorder, depressive type, with catatonic features  GAD with panic attacks and agoraphobia Alcohol use d/o - ?in remission? Developmental delay      PLAN: Safety and Monitoring:             --  Voluntary admission to inpatient psychiatric unit for safety, stabilization and treatment             -- Daily contact with patient to assess and evaluate symptoms and progress in treatment             -- Patient's case to be discussed in multi-disciplinary team meeting             -- Observation Level : q15 minute checks             --  Vital signs:  q12 hours             -- Precautions: suicide, elopement, and assault   2. Psychiatric Diagnoses and Treatment:               -Continue ativan from 0.5 mg q8H - for catatonic features of presumably psychosis (could also be due to substance withdrawal or underlying medical or neurological condition).    - Order MRI - concern the pt had some neurological change (falls, unsteadiness, that accompanied worsening psychosis/ocd - r/o previous CVA - ie - vascular process superimposed on severe long term schizophrenia )    -Decrease Clozapine from 300 mg bid -> to 200 mg qam and 300 mg at bedtime - for psychosis (re-started this morning 10-22)   -Increase Prozac from 40 mg to 60 mg daily for depression   -Continue Ativan tablet 0.5 mg p.o. every 8 hours as needed for anxiety     --  The  risks/benefits/side-effects/alternatives to this medication were discussed in detail with the patient and time was given for questions. The patient consents to medication trial.                -- Metabolic profile and EKG monitoring obtained while on an atypical antipsychotic (BMI: Lipid Panel: HbgA1c: QTc:) 505             -- Encouraged patient to participate in unit milieu and in scheduled group therapies                                3. Medical Issues Being Addressed:               Continue aspirin chewable tablet 81 mg p.o. daily Continue Lipitor tablets 20 mg p.o. daily for hyperlipidemia Continue Colace capsule 100 mg p.o. daily for constipation Continue Keppra tablet 500 mg p.o. 2 times daily for seizures prevention Continue Synthroid tablet 50 mcg p.o. daily for hypothyroidism Continue Protonix EC tablet 80 mg p.o. daily for GERD     4. Discharge Planning:              -- Social work and case management to assist with discharge planning and identification of hospital follow-up needs prior to discharge             -- Estimated LOS: 5-7 days             -- Discharge Concerns: Need to establish a safety plan; Medication compliance and effectiveness             -- Discharge Goals: Return home with outpatient referrals for mental health follow-up including medication management/psychotherapy      Phineas Inches, MD 09/25/2023, 2:31 PM  Total Time Spent in Direct Patient Care:  I personally spent 35 minutes on the unit in direct patient care. The direct patient care time included face-to-face time with the patient, reviewing the patient's chart, communicating with other professionals, and coordinating care. Greater than 50% of this time was spent in counseling or coordinating care with the patient regarding goals of hospitalization, psycho-education, and discharge planning needs.   Phineas Inches, MD Psychiatrist

## 2023-09-25 NOTE — Progress Notes (Signed)
Nursing 1:1 note D:Pt observed sitting in bed with eyes open. RR even and unlabored. No distress noted. A: 1:1 observation continues for safety  R: pt remains safe  

## 2023-09-25 NOTE — Progress Notes (Signed)
1:1 Note: Patient maintained on constant supervision for safety.  Patient in bed resting with no acute respiratory distress.  Medication given as prescribed.  Routine safety checks maintained.  Patient ambulatory on the unit with walker.  Offered support and encouragement as needed.  Patient is safe on the unit.

## 2023-09-26 ENCOUNTER — Encounter (HOSPITAL_COMMUNITY): Payer: Self-pay

## 2023-09-26 MED ORDER — CLOZAPINE 25 MG PO TABS
150.0000 mg | ORAL_TABLET | Freq: Every day | ORAL | Status: DC
  Filled 2023-09-26: qty 2

## 2023-09-26 MED ORDER — POLYETHYLENE GLYCOL 3350 17 G PO PACK: 17.0000 g | PACK | Freq: Every day | ORAL | Status: DC | PRN

## 2023-09-26 MED ORDER — BISMUTH SUBSALICYLATE 262 MG PO CHEW: 524.0000 mg | CHEWABLE_TABLET | ORAL | Status: DC | PRN

## 2023-09-26 MED ORDER — SENNA 8.6 MG PO TABS: 1.0000 | ORAL_TABLET | Freq: Every evening | ORAL | Status: DC | PRN

## 2023-09-26 MED ORDER — ONDANSETRON HCL 4 MG PO TABS: 8.0000 mg | ORAL_TABLET | Freq: Three times a day (TID) | ORAL | Status: DC | PRN

## 2023-09-26 MED ORDER — ATROPINE SULFATE 1 % OP SOLN
1.0000 [drp] | Freq: Every day | OPHTHALMIC | Status: DC
  Administered 2023-09-26 – 2023-09-28 (×3): 1 [drp] via SUBLINGUAL
  Filled 2023-09-26: qty 2

## 2023-09-26 MED ORDER — CLOZAPINE 25 MG PO TABS
150.0000 mg | ORAL_TABLET | Freq: Two times a day (BID) | ORAL | Status: DC
  Administered 2023-09-27: 150 mg via ORAL
  Filled 2023-09-26 (×5): qty 2

## 2023-09-26 NOTE — Progress Notes (Signed)
1:1 Note: Patient maintained on constant supervision for safety.  Denies suicidal thoughts, auditory and visual hallucination.  Patient in his room resting on the bed.  Offered no complaint.  Patient is safe on the unit with supervision.

## 2023-09-26 NOTE — Progress Notes (Signed)
I called the group home manager Amma on Thursday. We discussed the patients psychiatric baseline. It seems that over the past few months, patient has become more obsessed with the bathroom, more concerned about terms. Seems like OCD is flaring. I'm unclear if psychosis is actually getting worse. She states that outpatient psychiatrist increased clozapine to address this.   She also reports patient some clonazepam for many years, but this was changed to Ativan, doses of the Ativan. Patient had increasing anxiety, tremors, sweats.  I discussed that overall patient has had significant improvement since admission. The group home manager and I both agreed that clozapine dose is probably too high. I discussed with her that we are decreasing the clozapine and also scheduling the Ativan doses.   On Friday, follow up MRI results. Over the next 5 to 7 days, monitor response to increasing Prozac for OCD and depression. Consider continuing to decrease the clozapine total daily dose of 200 mg per day.

## 2023-09-26 NOTE — Plan of Care (Signed)
  Problem: Education: Goal: Emotional status will improve Outcome: Progressing   Problem: Activity: Goal: Interest or engagement in activities will improve Outcome: Progressing   Problem: Coping: Goal: Ability to verbalize frustrations and anger appropriately will improve Outcome: Progressing   Problem: Safety: Goal: Periods of time without injury will increase Outcome: Progressing    Pt tolerated dinner and fluids well when offered. Cooperative with care this shift. Awake in room, conversing with staff. Denies concerns at this time. 1:1 observation maintained for safety without incident thus far.

## 2023-09-26 NOTE — BH IP Treatment Plan (Signed)
Interdisciplinary Treatment and Diagnostic Plan Update  09/26/2023 Time of Session: 9:45 AM ( UPDATE)  [place] MRN: [id number]  Principal Diagnosis: Schizoaffective disorder, depressive type (HCC)  Secondary Diagnoses: Principal Problem:   Schizoaffective disorder, depressive type (HCC)   Current Medications:  Current Facility-Administered Medications  Medication Dose Route Frequency Provider Last Rate Last Admin   acetaminophen (TYLENOL) tablet 650 mg  650 mg Oral Q6H PRN Earney Navy, NP       alum & mag hydroxide-simeth (MAALOX/MYLANTA) 200-200-20 MG/5ML suspension 30 mL  30 mL Oral Q4H PRN Dahlia Byes C, NP       aspirin chewable tablet 81 mg  81 mg Oral Daily Dahlia Byes C, NP   81 mg at 09/26/23 0843   atorvastatin (LIPITOR) tablet 20 mg  20 mg Oral QHS Dahlia Byes C, NP   20 mg at 09/25/23 2115   atropine 1 % ophthalmic solution 1 drop  1 drop Sublingual QHS Lance Muss, MD       [START ON 09/27/2023] cloZAPine (CLOZARIL) tablet 150 mg  150 mg Oral Q12H Kizzie Ide B, MD       diphenhydrAMINE (BENADRYL) capsule 50 mg  50 mg Oral TID PRN Earney Navy, NP       Or   diphenhydrAMINE (BENADRYL) injection 50 mg  50 mg Intramuscular TID PRN Dahlia Byes C, NP       docusate sodium (COLACE) capsule 100 mg  100 mg Oral Daily Dahlia Byes C, NP   100 mg at 09/26/23 0841   FLUoxetine (PROZAC) capsule 60 mg  60 mg Oral Daily Massengill, Harrold Donath, MD   60 mg at 09/26/23 0865   haloperidol (HALDOL) tablet 5 mg  5 mg Oral TID PRN Earney Navy, NP       Or   haloperidol lactate (HALDOL) injection 5 mg  5 mg Intramuscular TID PRN Dahlia Byes C, NP       influenza vac split trivalent PF (FLULAVAL) injection 0.5 mL  0.5 mL Intramuscular Tomorrow-1000 Massengill, Harrold Donath, MD       levETIRAcetam (KEPPRA) tablet 500 mg  500 mg Oral BID Dahlia Byes C, NP   500 mg at 09/26/23 7846   levothyroxine (SYNTHROID) tablet 50 mcg  50  mcg Oral Q0600 Dahlia Byes C, NP   50 mcg at 09/26/23 9629   LORazepam (ATIVAN) tablet 2 mg  2 mg Oral TID PRN Earney Navy, NP       Or   LORazepam (ATIVAN) injection 2 mg  2 mg Intramuscular TID PRN Earney Navy, NP       LORazepam (ATIVAN) tablet 0.5 mg  0.5 mg Oral Q8H PRN Dahlia Byes C, NP   0.5 mg at 09/21/23 2131   LORazepam (ATIVAN) tablet 0.5 mg  0.5 mg Oral Q8H Massengill, Nathan, MD   0.5 mg at 09/26/23 1356   magnesium hydroxide (MILK OF MAGNESIA) suspension 30 mL  30 mL Oral Daily PRN Earney Navy, NP       pantoprazole (PROTONIX) EC tablet 80 mg  80 mg Oral Daily Dahlia Byes C, NP   80 mg at 09/26/23 5284   PTA Medications: Medications Prior to Admission  Medication Sig Dispense Refill Last Dose   acetaminophen (TYLENOL) 500 MG tablet Take 500-1,000 mg by mouth every 4 (four) hours as needed (pain).      aspirin 81 MG chewable tablet Chew 1 tablet (81 mg total) by mouth daily.  atorvastatin (LIPITOR) 20 MG tablet Take 1 tablet (20 mg total) by mouth at bedtime. 30 tablet 1    clonazePAM (KLONOPIN) 0.5 MG tablet Take 1 tablet (0.5 mg total) by mouth at bedtime. (Patient not taking: Reported on 09/20/2023) 30 tablet 0    cloZAPine (CLOZARIL) 100 MG tablet Take 1 tablet (100 mg total) by mouth 2 (two) times daily. (Patient taking differently: Take 300 mg by mouth See admin instructions. Take 300mg  in the morning and 300mg  at bedtime) 60 tablet 0    docusate sodium (COLACE) 100 MG capsule Take 100 mg by mouth daily.       FLUoxetine (PROZAC) 40 MG capsule Take 40 mg by mouth 2 (two) times daily.      folic acid (FOLVITE) 400 MCG tablet Take 1,000 mcg by mouth daily.      levETIRAcetam (KEPPRA) 500 MG tablet Take 500 mg by mouth 2 (two) times daily.      levothyroxine (SYNTHROID, LEVOTHROID) 50 MCG tablet Take 1 tablet (50 mcg total) by mouth daily before breakfast. For underactive thyroid 30 tablet 0    LORazepam (ATIVAN) 1 MG tablet Take 1  mg by mouth 2 (two) times daily as needed.      omeprazole (PRILOSEC) 40 MG capsule Take 1 capsule (40 mg total) by mouth daily. 30 capsule 1    QUEtiapine (SEROQUEL) 100 MG tablet Take 100 mg by mouth at bedtime. (Patient not taking: Reported on 09/20/2023)       Patient Stressors: Medication change or noncompliance    Patient Strengths: Supportive family/friends   Treatment Modalities: Medication Management, Group therapy, Case management,  1 to 1 session with clinician, Psychoeducation, Recreational therapy.   Physician Treatment Plan for Primary Diagnosis: Schizoaffective disorder, depressive type (HCC) Long Term Goal(s): Improvement in symptoms so as ready for discharge   Short Term Goals: Ability to identify changes in lifestyle to reduce recurrence of condition will improve Ability to verbalize feelings will improve Ability to disclose and discuss suicidal ideas Ability to demonstrate self-control will improve Ability to identify and develop effective coping behaviors will improve Ability to maintain clinical measurements within normal limits will improve Compliance with prescribed medications will improve Ability to identify triggers associated with substance abuse/mental health issues will improve  Medication Management: Evaluate patient's response, side effects, and tolerance of medication regimen.  Therapeutic Interventions: 1 to 1 sessions, Unit Group sessions and Medication administration.  Evaluation of Outcomes: Progressing  Physician Treatment Plan for Secondary Diagnosis: Principal Problem:   Schizoaffective disorder, depressive type (HCC)  Long Term Goal(s): Improvement in symptoms so as ready for discharge   Short Term Goals: Ability to identify changes in lifestyle to reduce recurrence of condition will improve Ability to verbalize feelings will improve Ability to disclose and discuss suicidal ideas Ability to demonstrate self-control will improve Ability  to identify and develop effective coping behaviors will improve Ability to maintain clinical measurements within normal limits will improve Compliance with prescribed medications will improve Ability to identify triggers associated with substance abuse/mental health issues will improve     Medication Management: Evaluate patient's response, side effects, and tolerance of medication regimen.  Therapeutic Interventions: 1 to 1 sessions, Unit Group sessions and Medication administration.  Evaluation of Outcomes: Progressing   RN Treatment Plan for Primary Diagnosis: Schizoaffective disorder, depressive type (HCC) Long Term Goal(s): Knowledge of disease and therapeutic regimen to maintain health will improve  Short Term Goals: Ability to remain free from injury will improve, Ability to verbalize frustration  and anger appropriately will improve, Ability to participate in decision making will improve, Ability to verbalize feelings will improve, Ability to identify and develop effective coping behaviors will improve, and Compliance with prescribed medications will improve  Medication Management: RN will administer medications as ordered by provider, will assess and evaluate patient's response and provide education to patient for prescribed medication. RN will report any adverse and/or side effects to prescribing provider.  Therapeutic Interventions: 1 on 1 counseling sessions, Psychoeducation, Medication administration, Evaluate responses to treatment, Monitor vital signs and CBGs as ordered, Perform/monitor CIWA, COWS, AIMS and Fall Risk screenings as ordered, Perform wound care treatments as ordered.  Evaluation of Outcomes: Progressing   LCSW Treatment Plan for Primary Diagnosis: Schizoaffective disorder, depressive type (HCC) Long Term Goal(s): Safe transition to appropriate next level of care at discharge, Engage patient in therapeutic group addressing interpersonal concerns.  Short Term  Goals: Engage patient in aftercare planning with referrals and resources, Increase social support, Increase emotional regulation, Facilitate acceptance of mental health diagnosis and concerns, Identify triggers associated with mental health/substance abuse issues, and Increase skills for wellness and recovery  Therapeutic Interventions: Assess for all discharge needs, 1 to 1 time with Social worker, Explore available resources and support systems, Assess for adequacy in community support network, Educate family and significant other(s) on suicide prevention, Complete Psychosocial Assessment, Interpersonal group therapy.  Evaluation of Outcomes: Progressing   Progress in Treatment: Attending groups: No. Participating in groups: No. Taking medication as prescribed: Yes. Toleration medication: Yes. Family/Significant other contact made: Yes mom [name] and Roena Malady ( group home )  Patient understands diagnosis: No. Discussing patient identified problems/goals with staff: Yes. Medical problems stabilized or resolved: Yes. Denies suicidal/homicidal ideation: Yes. Issues/concerns per patient self-inventory: No.     New problem(s) identified: No, Describe:  none reported   New Short Term/Long Term Goal(s): medication stabilization, elimination of SI thoughts, development of comprehensive mental wellness plan.      Patient Goals:  "Get my medications right"   Discharge Plan or Barriers: Patient recently admitted. CSW will continue to follow and assess for appropriate referrals and possible discharge planning.      Reason for Continuation of Hospitalization: Medication stabilization   Estimated Length of Stay: 4-5 DAYS    Last 3 [place] Suicide Severity Risk Score: Flowsheet Row Ad[facility] (Current) [facility] from 09/19/2023 in [facility] at [facility]  C-SSRS RISK CATEGORY No Risk No Risk        Last PHQ 2/9 Scores:     No data to display          Scribe for Treatment Team: Beather Arbour 09/26/2023 3:24 PM

## 2023-09-26 NOTE — Group Note (Signed)
Date:  09/26/2023 Time:  9:01 PM  Group Topic/Focus:  Wrap-Up Group:   The focus of this group is to help patients review their daily goal of treatment and discuss progress on daily workbooks.    Participation Level:  Did Not Attend      Scot Dock 09/26/2023, 9:01 PM

## 2023-09-26 NOTE — Progress Notes (Signed)
   09/26/23 2130  Psych Admission Type (Psych Patients Only)  Admission Status Voluntary  Psychosocial Assessment  Patient Complaints None  Eye Contact Avertive  Facial Expression Flat  Affect Appropriate to circumstance  Speech Soft  Interaction Cautious;Guarded  Motor Activity Restless;Fidgety  Appearance/Hygiene Disheveled  Behavior Characteristics Cooperative  Mood Pleasant  Aggressive Behavior  Effect No apparent injury  Thought Administrator, sports thinking  Content WDL  Delusions WDL  Perception WDL  Hallucination None reported or observed  Judgment Limited  Confusion WDL  Danger to Self  Current suicidal ideation? Denies

## 2023-09-26 NOTE — Plan of Care (Signed)
  Problem: Education: Goal: Mental status will improve Outcome: Progressing   Problem: Activity: Goal: Interest or engagement in activities will improve Outcome: Progressing   

## 2023-09-26 NOTE — Progress Notes (Signed)
1:1 Note: Patient maintained on constant supervision for safety.  Medication given as prescribed.  Patient ambulating on the unit with a walker with steady gait.

## 2023-09-26 NOTE — Progress Notes (Signed)
Nursing 1:1 note D:Pt observed sleeping in bed with eyes closed. RR even and unlabored. No distress noted. A: 1:1 observation continues for safety  R: pt remains safe  

## 2023-09-26 NOTE — Progress Notes (Incomplete)
[facility] MD Progress Note  09/27/2023 12:59 PM Brady Hoffman  MRN:  [id number]  Principal Problem: Schizoaffective disorder, depressive type (HCC) Diagnosis: Principal Problem:   Schizoaffective disorder, depressive type (HCC)  Reason for Admission:  Ilir Guyot is a 57 year old Caucasian male with prior psychiatric history significant for schizoaffective disorder, seizure disorder, developmental delay, alcohol use disorder, who presents voluntarily [facility] from [facility] health ED at [facility] for evaluation for his mental health illness and frequent incidence of falls. After medical evaluation/stabilization & clearance, he was transferred to the [facility] for further psychiatric evaluation & treatments (admitted on 09/21/2023, total  LOS: 6 days )  Chart Review from last 24 hours:  The patient's chart was reviewed and nursing notes were reviewed. The patient's case was discussed in multidisciplinary team meeting.   - Overnight events to report per chart review / staff report:  per nursing sleeping 10.5 hours  - Patient received all scheduled medications - Patient did not receive any PRN medications  Information Obtained Today During Patient Interview: The patient was seen and evaluated on the unit. On assessment today the patient was seen to be repeatedly getting up from bed, pulling up his shirt, pulling down his pants, laying back down, and repeating the cycle again and again. When asked what is wrong, he says, "germs."  After speaking to nursing staff, it was discovered that patient began to behave this way after visitation with his mom. Because he was observed to be agitated, he was given diphenhydramine 50 mg, lorazepam 2 mg, and haloperidol 5 mg as part of his agitation protocol orders.  I requested Dr. Enedina Finner to come see patient with me to assess. Appropriate treatment was ordered. We came back multiple times afterwards to see patient to reassess response to treatment. Patient was noted  to be improving on prescribed psychotropics.  We came back again to see patient at 1415 and found him to be sitting comfortably in bed without agitation, cogwheeling, diaphoresis, or exhibiting extrapyramidal symptoms.  Patient denies SI and HI. He denies AVH.  Past Psychiatric History:  Previous Psych Diagnoses: Panic disorder with agoraphobia, history of seizures, history of alcohol use disorder, schizoaffective disorder Prior inpatient treatment: Was admitted to [facility] years ago Current/prior outpatient treatment: Yes with Dr. Jannifer Franklin Prior rehab hx: Denies Psychotherapy hx: Yes History of suicide: Denies History of homicide or aggression: Denies Psychiatric medication history: Patient has been on trial Seroquel, Prolixin, Effexor, and Haldol Psychiatric medication compliance history: Compliance Neuromodulation history: Denies Current Psychiatrist: Dr. Jannifer Franklin Current therapist: Dr. 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    Pervasive developmental disorder    Schizoaffective disorder    Seizures (HCC)    Family History: Medical: Patient unsure Psych: Patient unsure Psych Rx: Patient unsure SA/HA: Patient unsure Substance use family hx: Patient unsure  Social History:  Childhood (bring, raised, lives now, parents, siblings, schooling, education): Some college Abuse: Denies Marital Status: Denies Sexual orientation: Male from birth Children: No children Employment: Unemployed Peer Group: Denies peer group Housing: Lives at the group home Finances: Some financial difficulty Legal: Denies 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 [employer]  Current Medications: Current Facility-Administered Medications  Medication Dose Route Frequency Provider Last Rate Last Admin    acetaminophen (TYLENOL)  tablet 650 mg  650 mg Oral Q6H PRN Dahlia Byes C, NP       alum & mag hydroxide-simeth (MAALOX/MYLANTA) 200-200-20 MG/5ML suspension 30 mL  30 mL Oral Q4H PRN Dahlia Byes C, NP       aspirin chewable tablet 81 mg  81 mg Oral Daily Onuoha, Josephine C, NP   81 mg at 09/27/23 0834   atorvastatin (LIPITOR) tablet 20 mg  20 mg Oral QHS Dahlia Byes C, NP   20 mg at 09/26/23 2136   atropine 1 % ophthalmic solution 1 drop  1 drop Sublingual QHS Kizzie Ide B, MD   1 drop at 09/26/23 2138   benztropine (COGENTIN) tablet 2 mg  2 mg Oral BID Augusto Gamble, MD   2 mg at 09/27/23 1256   bismuth subsalicylate (PEPTO BISMOL) chewable tablet 524 mg  524 mg Oral Q3H PRN Augusto Gamble, MD       cloZAPine (CLOZARIL) tablet 150 mg  150 mg Oral Q12H Kizzie Ide B, MD   150 mg at 09/27/23 9147   diphenhydrAMINE (BENADRYL) capsule 50 mg  50 mg Oral TID PRN Dahlia Byes C, NP   50 mg at 09/27/23 1024   Or   diphenhydrAMINE (BENADRYL) injection 50 mg  50 mg Intramuscular TID PRN Earney Navy, NP       docusate sodium (COLACE) capsule 100 mg  100 mg Oral Daily Welford Roche, Josephine C, NP   100 mg at 09/27/23 0835   FLUoxetine (PROZAC) capsule 60 mg  60 mg Oral Daily Massengill, Harrold Donath, MD   60 mg at 09/27/23 0835   influenza vac split trivalent PF (FLULAVAL) injection 0.5 mL  0.5 mL Intramuscular Tomorrow-1000 Massengill, Harrold Donath, MD       levETIRAcetam (KEPPRA) tablet 500 mg  500 mg Oral BID Dahlia Byes C, NP   500 mg at 09/27/23 0835   levothyroxine (SYNTHROID) tablet 50 mcg  50 mcg Oral Q0600 Dahlia Byes C, NP   50 mcg at 09/27/23 8295   LORazepam (ATIVAN) tablet 2 mg  2 mg Oral TID PRN Dahlia Byes C, NP   2 mg at 09/27/23 1023   Or   LORazepam (ATIVAN) injection 2 mg  2 mg Intramuscular TID PRN Dahlia Byes C, NP       LORazepam (ATIVAN) tablet 0.5 mg  0.5 mg Oral Q8H PRN Dahlia Byes C, NP   0.5 mg at 09/21/23 2131   LORazepam (ATIVAN)  tablet 0.5 mg  0.5 mg Oral Q8H Massengill, Nathan, MD   0.5 mg at 09/27/23 6213   LORazepam (ATIVAN) tablet 1 mg  1 mg Oral Once Augusto Gamble, MD       ondansetron [facility]) tablet 8 mg  8 mg Oral Q8H PRN Augusto Gamble, MD       pantoprazole (PROTONIX) EC tablet 80 mg  80 mg Oral Daily Onuoha, Josephine C, NP   80 mg at 09/27/23 0834   polyethylene glycol (MIRALAX / GLYCOLAX) packet 17 g  17 g Oral Daily PRN Augusto Gamble, MD       senna (SENOKOT) tablet 8.6 mg  1 tablet Oral QHS PRN Augusto Gamble, MD        Lab Results: No results found for this or any previous visit (from the past 48 hour(s)).  Blood Alcohol level:  Lab Results  Component Value Date   [facility] <10 09/19/2023   ETH <10 09/28/2019    Metabolic Labs: Lab Results  Component Value Date   HGBA1C  5.7 (H) 09/23/2023   MPG 116.89 09/23/2023   MPG 114 02/25/2014   No results found for: "PROLACTIN" Lab Results  Component Value Date   CHOL 144 07/03/2023   TRIG 152 (H) 07/03/2023   HDL 37 (L) 07/03/2023   CHOLHDL 3.9 07/03/2023   VLDL 28 10/20/2009   LDLCALC 82 07/03/2023   LDLCALC 59 10/02/2021    Physical Findings: AIMS: AIMS: Facial and Oral Movements Muscles of Facial Expression: None Lips and Perioral Area: None Jaw: None Tongue: Mild,Extremity Movements Upper (arms, wrists, hands, fingers): Mild Lower (legs, knees, ankles, toes): None, Trunk Movements Neck, shoulders, hips: Minimal,may be extreme normal, Global Judgements Severity of abnormal movements overall : Mild Incapacitation due to abnormal movements: Severe Patient's awareness of abnormal movements: Aware, mild distress, Dental Status Current problems with teeth and/or dentures?: No Does patient usually wear dentures?: No Edentia?: No  Total score: 5  Tremors:Yes Cogwheeling:Yes Rigidity:Yes   CIWA:    COWS:     Psychiatric Specialty Exam: General Appearance:  Bizarre   Eye Contact:  Fleeting   Speech:  Clear and Coherent; Normal Rate    Volume:  Normal   Mood:  -- ("not too good")   Affect:  Appropriate; Congruent; Restricted (in distress)   Thought Content:  WDL   Suicidal Thoughts:  Suicidal Thoughts: No    Homicidal Thoughts:  Homicidal Thoughts: No    Thought Process:  Coherent; Linear (concrete)   Orientation:  Full (Time, Place and Person)     Memory:  Immediate Good   Judgment:  Fair   Insight:  Lacking   Concentration:  Good   Recall:  Good   Fund of Knowledge:  Good   Language:  Good   Psychomotor Activity:  Psychomotor Activity: Other (comment); Extrapyramidal Side Effects (EPS) Extrapyramidal Side Effects (EPS): Akathisia; Dystonia AIMS Completed?: Yes (see AIMS scoring)    Assets:  Resilience   Sleep:  Sleep: -- (not assessed)     Review of Systems Review of Systems  Reason unable to perform ROS: patient in moderate distress.  Psychiatric/Behavioral:         Psychiatric subjective data addressed in PSE or HPI / daily subjective report    Vital Signs: Blood pressure (!) 143/96, pulse (!) 128, temperature 97.8 F (36.6 C), temperature source Oral, resp. rate 18, height 6\' 3"  (1.905 m), weight 94.8 kg, SpO2 97%. Body mass index is 26.12 kg/m. Physical Exam Vitals and nursing note reviewed.  Constitutional:      General: He is in acute distress.     Appearance: He is diaphoretic.   See AIMS, tremors, cogwheeling, and rigidity scoring above  Assets  Assets: Resilience   Treatment Plan Summary: Daily contact with patient to assess and evaluate symptoms and progress in treatment and Medication management  Diagnoses / Active Problems: Schizoaffective disorder, depressive type (HCC) Principal Problem:   Schizoaffective disorder, depressive type (HCC)   ASSESSMENT: Terrill Makovec is a 57 year old Caucasian male with prior psychiatric history significant for schizoaffective disorder, seizure disorder, developmental delay, alcohol use disorder, who  presents voluntarily [facility] from [facility] health ED at [facility] for evaluation for his mental health illness and frequent incidence of falls. After medical evaluation/stabilization & clearance, he was transferred to the [facility] for further psychiatric evaluation & treatments  Patient developed worsening symptoms of OCD today and developed cholinergic symptoms suspected to be secondary to administration of haloperidol as part of agitation protocol superimposed on rapid decrease in clozapine dose  PLAN: Safety  and Monitoring:  -- Voluntary admission to inpatient psychiatric unit for safety, stabilization and treatment  -- Daily contact with patient to assess and evaluate symptoms and progress in treatment  -- Patient's case to be discussed in multi-disciplinary team meeting  -- Observation Level : q15 minute checks  -- Vital signs:  q12 hours  -- Precautions: suicide, elopement, and assault  2. Interventions (medications, psychoeducation, etc):   -- given one-time dose of clozapine 150 mg, 1 mg IV and 2 mg oral benztropine, and 1 mg lorazepam to treat cholinergic symptoms -- start benztropine 2 mg twice daily for cholinergic symptoms -- continue clozapine 150 mg every 12 hours for psychotic symptoms, last clozapine level on 09/20/2023 was 1,723  -- continue fluoxetine 60 mg daily for depressive symptoms, anxiety  -- continue lorazepam 0.5 mg every 8 hours for anxiety  -- removed haloperidol from agitation protocol due to worsening cholinergic symptoms  -- medical management: atropine at bedtime for drooling, aspirin, atorvastatin, docusate sodium, levetiracetam, levothyroxine, and pantoprazole  -- Patient does not need nicotine replacement  PRN medications for symptomatic management:              -- lorazepam 0.5 mg every 8 hours as needed for anxiety              -- continue acetaminophen 650 mg every 6 hours as needed for mild to moderate pain, fever, and headaches              -- continue  bismuth subsalicylate 524 mg oral chewable tablet every 3 hours as needed for diarrhea / loose stools              -- continue senna 8.6 mg oral at bedtime and polyethylene glycol 17 g oral daily as needed for mild to moderate constipation              -- continue ondansetron 8 mg every 8 hours as needed for nausea or vomiting              -- continue aluminum-magnesium hydroxide + simethicone 30 mL every 4 hours as needed for heartburn or indigestion  -- As needed agitation protocol in-place  The risks/benefits/side-effects/alternatives to the above medication were discussed in detail with the patient and time was given for questions. The patient consents to medication trial. FDA black box warnings, if present, were discussed.  The patient is agreeable with the medication plan, as above. We will monitor the patient's response to pharmacologic treatment, and adjust medications as necessary.  3. Routine and other pertinent labs:             -- Metabolic profile:  BMI: Body mass index is 26.12 kg/m.  Prolactin: No results found for: "PROLACTIN"  Lipid Panel: Lab Results  Component Value Date   CHOL 144 07/03/2023   TRIG 152 (H) 07/03/2023   HDL 37 (L) 07/03/2023   CHOLHDL 3.9 07/03/2023   VLDL 28 10/20/2009   LDLCALC 82 07/03/2023   LDLCALC 59 10/02/2021    HbgA1c: Hgb A1c MFr Bld (%)  Date Value  09/23/2023 5.7 (H)    TSH: TSH  Date Value  09/24/2023 2.765 uIU/mL  07/03/2023 4.03 mIU/L    EKG monitoring: QTc: 474 on 09/26/2023  4. Group Therapy:  -- Encouraged patient to participate in unit milieu and in scheduled group therapies   -- Short Term Goals: Ability to identify changes in lifestyle to reduce recurrence of condition, verbalize feelings, identify and develop effective coping  behaviors, maintain clinical measurements within normal limits, and identify triggers associated with substance abuse/mental health issues will improve. Improvement in ability to  demonstrate self-control and comply with prescribed medications.  -- Long Term Goals: Improvement in symptoms so as ready for discharge -- Patient is encouraged to participate in group therapy while admitted to the psychiatric unit. -- We will address other chronic and acute stressors, which contributed to the patient's Schizoaffective disorder, depressive type (HCC) in order to reduce the risk of self-harm at discharge.  5. Discharge Planning:   -- Social work and case management to assist with discharge planning and identification of hospital follow-up needs prior to discharge  -- Estimated LOS: 4 days  -- Discharge Concerns: Need to establish a safety plan; Medication compliance and effectiveness  -- Discharge Goals: Return home with outpatient referrals for mental health follow-up including medication management/psychotherapy  I certify that inpatient services furnished can reasonably be expected to improve the patient's condition.   Signed: Augusto Gamble, MD 09/27/2023, 12:59 PM

## 2023-09-26 NOTE — Progress Notes (Addendum)
[facility] MD Progress Note  09/26/2023 9:01 AM Brady Hoffman  MRN:  [id number]   Reason for admission:  Brady Hoffman is a 57 year old Caucasian male with prior psychiatric history significant for schizoaffective disorder, seizure disorder, developmental delay, alcohol use disorder, who presents voluntarily [facility] from [facility] health ED at [facility] for evaluation for his mental health illness and frequent incidence of falls. After medical evaluation/stabilization & clearance, he was transferred [facility] for further psychiatric evaluation & treatments.   Chart Review in last 24 hours: The patient's chart was reviewed and nursing notes were reviewed. Vitals signs: P 107, positive orthostatic hypotension. The patient's case was discussed in multidisciplinary team meeting. Per [facility], patient was taking medications appropriately. The following as needed medications were given: none. Per nursing, patient is on 1:1 and is restless and fidgety and attended no group sessions.    Information Obtained Today During Patient Interview: The patient was seen in his room, no acute distress. On assessment, the patient feels "fine" today. He reports coming back from the hospital to get his MRI in which he felt was uncomfortable but otherwise feels fine. When asked about speaking with family or friends, he reports his mother came to visit yesterday and felt the visit went well. Patient reports having good sleep and good appetite. Patient feels that the medications have not been very noticeable and reports adverse effects such as chest pain and some drooling. She reports the chest pain has been present for weeks and its left sided near his heart. He rates the severity a 5/10. I discussed how we will obtain a repeat EKG for the chest pain along with the most recent EKG showing prolonged qtc.   Denies SI, HI, AVH, and paranoia.    Review of symptoms, specific for clozapine: Malaise/Sedation: Yes, this could be due  to Ativan Chest pain: Yes Shortness of breath:Denies Exertional capacity:Denies Tachycardia:Denies Cough:Denies Sore Throat:Denies Fever:Denies Orthostatic hypotension (dizziness with standing): Reports dizzy with standing Hypersalivation: Yes, some Constipation:Denies Symptoms of GERD:Denies Nausea:Denies Nocturnal enuresis:Denies    Principal Problem: Schizoaffective disorder, depressive type (HCC) Diagnosis: Principal Problem:   Schizoaffective disorder, depressive type (HCC)  Total Time spent with patient: 20 minutes  Past Psychiatric History:  Previous Psych Diagnoses: Panic disorder with agoraphobia, history of seizures, history of alcohol use disorder, schizoaffective disorder Prior inpatient treatment: Was admitted to [facility] 5 years ago Current/prior outpatient treatment: Yes with Dr. Jannifer Franklin Prior rehab hx: Denies Psychotherapy hx: Yes History of suicide: Denies History of homicide or aggression: Denies Psychiatric medication history: Patient has been on trial Seroquel, Prolixin, Effexor, and Haldol Psychiatric medication compliance history: Compliance Neuromodulation history: Denies Current Psychiatrist: Dr. Jannifer Franklin Current therapist: Dr. 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    Pervasive developmental disorder    Schizoaffective disorder    Seizures (HCC)     Past Surgical History:  Procedure Laterality Date   ESOPHAGOGASTRODUODENOSCOPY  11/01/2011   Procedure: ESOPHAGOGASTRODUODENOSCOPY (EGD);  Surgeon: Theda Belfast;  Location: [facility]COPY;  Service: Endoscopy;  Laterality: N/A;   MOLE REMOVAL     WISDOM TOOTH EXTRACTION     Family History:  Family History  Adopted: Yes   Family Psychiatric  History: See H&P   Social  History:   Childhood (bring, raised, lives now, parents, siblings, schooling, education): Some college Abuse: Denies Marital Status: Denies Sexual orientation: Male from birth Children: No children Employment: Unemployed Peer Group: Denies peer group Housing: Lives at the group home Finances: Some financial difficulty Legal: Denies 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 [employer]  Current Medications: Current Facility-Administered Medications  Medication Dose Route Frequency Provider Last Rate Last Admin   acetaminophen (TYLENOL) tablet 650 mg  650 mg Oral Q6H PRN Earney Navy, NP       alum & mag hydroxide-simeth (MAALOX/MYLANTA) 200-200-20 MG/5ML suspension 30 mL  30 mL Oral Q4H PRN Dahlia Byes C, NP       aspirin chewable tablet 81 mg  81 mg Oral Daily Onuoha, Josephine C, NP   81 mg at 09/26/23 0843   atorvastatin (LIPITOR) tablet 20 mg  20 mg Oral QHS Onuoha, Josephine C, NP   20 mg at 09/25/23 2115   atropine 1 % ophthalmic solution 1 drop  1 drop Sublingual QHS Kizzie Ide B, MD       cloZAPine (CLOZARIL) tablet 200 mg  200 mg Oral Daily Massengill, Harrold Donath, MD   200 mg at 09/26/23 1610   cloZAPine (CLOZARIL) tablet 300 mg  300 mg Oral QHS Massengill, Harrold Donath, MD   300 mg at 09/25/23 2115   diphenhydrAMINE (BENADRYL) capsule 50 mg  50 mg Oral TID PRN Earney Navy, NP       Or   diphenhydrAMINE (BENADRYL) injection 50 mg  50 mg Intramuscular TID PRN Dahlia Byes C, NP       docusate sodium (COLACE) capsule 100 mg  100 mg Oral Daily Welford Roche, Josephine C, NP   100 mg at 09/26/23 0841   FLUoxetine (PROZAC) capsule 60 mg  60 mg Oral Daily Massengill, Harrold Donath, MD   60 mg at 09/26/23 9604   haloperidol (HALDOL) tablet 5 mg  5 mg Oral TID PRN Dahlia Byes C, NP       Or   haloperidol lactate (HALDOL) injection 5 mg  5 mg Intramuscular TID PRN Dahlia Byes C, NP       influenza vac split trivalent PF (FLULAVAL) injection 0.5 mL  0.5 mL Intramuscular Tomorrow-1000  Massengill, Harrold Donath, MD       levETIRAcetam (KEPPRA) tablet 500 mg  500 mg Oral BID Dahlia Byes C, NP   500 mg at 09/26/23 0842   levothyroxine (SYNTHROID) tablet 50 mcg  50 mcg Oral Q0600 Dahlia Byes C, NP   50 mcg at 09/26/23 5409   LORazepam (ATIVAN) tablet 2 mg  2 mg Oral TID PRN Earney Navy, NP       Or   LORazepam (ATIVAN) injection 2 mg  2 mg Intramuscular TID PRN Dahlia Byes C, NP       LORazepam (ATIVAN) tablet 0.5 mg  0.5 mg Oral Q8H PRN Dahlia Byes C, NP   0.5 mg at 09/21/23 2131   LORazepam (ATIVAN) tablet 0.5 mg  0.5 mg Oral Q8H Massengill, Nathan, MD   0.5 mg at 09/26/23 8119   magnesium hydroxide (MILK OF MAGNESIA) suspension 30 mL  30 mL Oral Daily PRN Dahlia Byes C, NP       pantoprazole (PROTONIX) EC tablet 80 mg  80 mg Oral Daily Onuoha, Josephine C, NP   80 mg at 09/26/23 1478    Lab Results:  No results found for this or any previous visit (from the past 48 hour(s)).   Blood Alcohol level:  Lab  Results  Component Value Date   ETH <10 09/19/2023   ETH <10 09/28/2019    Metabolic Disorder Labs: Lab Results  Component Value Date   HGBA1C 5.7 (H) 09/23/2023   MPG 116.89 09/23/2023   MPG 114 02/25/2014   No results found for: "PROLACTIN" Lab Results  Component Value Date   CHOL 144 07/03/2023   TRIG 152 (H) 07/03/2023   HDL 37 (L) 07/03/2023   CHOLHDL 3.9 07/03/2023   VLDL 28 10/20/2009   LDLCALC 82 07/03/2023   LDLCALC 59 10/02/2021    Physical Findings: AIMS:  , ,  ,  ,    CIWA:    COWS:     Musculoskeletal: Strength & Muscle Tone: abnormal Gait & Station: unsteady Patient leans: Front  Psychiatric Specialty Exam:  General Appearance: appears at stated age, casually dressed and fairly groomed   Behavior: pleasant and cooperative   Psychomotor Activity: no psychomotor agitation or retardation noted   Eye Contact: fair  Speech: normal amount, tone, volume and fluency    Mood: depressed  Affect:  restricted  Thought Process: fairly concrete responses  Descriptions of Associations: intact   Thought Content Hallucinations: denies AH, VH , does not appear responding to stimuli  Delusions: no paranoia, delusions of control, grandeur, ideas of reference, thought broadcasting, and magical thinking  Suicidal Thoughts: denies SI, intention, plan  Homicidal Thoughts: denies HI, intention, plan   Alertness/Orientation: alert and fully oriented   Insight: limited Judgment: fair  Memory: intact   Executive Functions  Concentration: intact  Attention Span: fair  Recall: intact  Fund of Knowledge: fair     Physical Exam: Physical Exam Vitals reviewed.  Constitutional:      General: He is not in acute distress.    Appearance: He is not toxic-appearing.  Pulmonary:     Effort: Pulmonary effort is normal. No respiratory distress.  Neurological:     Mental Status: He is alert.     Motor: Weakness present.     Gait: Gait abnormal.    Review of Systems  Constitutional:  Negative for chills and fever.  Cardiovascular:  Positive for chest pain. Negative for palpitations.  Gastrointestinal:  Negative for constipation.  Neurological:  Negative for dizziness, tingling, tremors, weakness and headaches.  Psychiatric/Behavioral:  Positive for depression. Negative for hallucinations, memory loss, substance abuse and suicidal ideas. The patient is nervous/anxious. The patient does not have insomnia.   All other systems reviewed and are negative.  Blood pressure 117/87, pulse (!) 106, temperature 98.4 F (36.9 C), temperature source Oral, resp. rate 20, height 6\' 3"  (1.905 m), weight 94.8 kg, SpO2 97%. Body mass index is 26.12 kg/m.   Treatment Plan Summary: Daily contact with patient to assess and evaluate symptoms and progress in treatment and Medication management   ASSESSMENT:   Diagnoses / Active Problems: Schizoaffective disorder, depressive type, with catatonic features   GAD with panic attacks and agoraphobia Alcohol use d/o - ?in remission? Developmental delay      PLAN: Safety and Monitoring:             --  Voluntary admission to inpatient psychiatric unit for safety, stabilization and treatment             -- Daily contact with patient to assess and evaluate symptoms and progress in treatment             -- Patient's case to be discussed in multi-disciplinary team meeting             --  Observation Level : q15 minute checks             -- Vital signs:  q12 hours             -- Precautions: suicide, elopement, and assault   2. Psychiatric Diagnoses and Treatment:  -Decrease Clozapine to 150 mg tonight (received 200 mg this AM) then 150 mg BID tomorrow (Decreased from home dose of 300 mg bid)   -Clozapine level on 10/19 HIGH at 1,723 -Start atropine 1% ophthalmic solution 1 drop sublingual at bedtime for drooling             -Continue ativan from 0.5 mg q8H - for catatonic features of presumably psychosis (could also be due to substance withdrawal or underlying medical or neurological condition)-improving, plan to stop on 10/29 (one week after catatonia) -Continue Prozac 60 mg daily for depression -Continue Ativan tablet 0.5 mg p.o. every 8 hours as needed for anxiety  --  The risks/benefits/side-effects/alternatives to this medication were discussed in detail with the patient and time was given for questions. The patient consents to medication trial.  -- Metabolic profile and EKG monitoring obtained while on an atypical antipsychotic (BMI: 26.12 Lipid Panel: triglycerides 152 HbgA1c: 5.7 QTc: 505 on 10/18; repeat EKG pending)  -- Encouraged patient to participate in unit milieu and in scheduled group therapies   3. Labs and Imaging  - MRI on 10/24 shows  1. No evidence of an acute intracranial abnormality. 2. Several small nonspecific T2 FLAIR hyperintense chronic insults within the cerebral white matter, new from the prior brain MRI of 01/07/2014.  Findings are nonspecific, but most often secondary to chronic small vessel ischemia. 3. Moderate generalized cerebral atrophy, greater than expected for age. 4. Paranasal sinus disease as described. 5. Trace left mastoid effusion.  CBC: RBC 3.79, Hgb 11.8, abs neutrophil count WNL BMP: gluc 117, GFR>60 TSH WNL Hgb A1c 5.7  Keppra level 6.5 Clozapine HIGH at 1,723              4. Medical Issues Being Addressed:               Continue aspirin chewable tablet 81 mg p.o. daily Continue Lipitor tablets 20 mg p.o. daily for hyperlipidemia Continue Colace capsule 100 mg p.o. daily for constipation Continue Keppra tablet 500 mg p.o. 2 times daily for seizures prevention Continue Synthroid tablet 50 mcg p.o. daily for hypothyroidism Continue Protonix EC tablet 80 mg p.o. daily for GERD     5. Discharge Planning:              -- Social work and case management to assist with discharge planning and identification of hospital follow-up needs prior to discharge             -- Estimated LOS: 5-7 days             -- Discharge Concerns: Need to establish a safety plan; Medication compliance and effectiveness             -- Discharge Goals: Return home with outpatient referrals for mental health follow-up including medication management/psychotherapy     Lance Muss, MD 09/26/2023, 9:01 AM  Total Time Spent in Direct Patient Care:  I personally spent 30 minutes on the unit in direct patient care. The direct patient care time included face-to-face time with the patient, reviewing the patient's chart, communicating with other professionals, and coordinating care. Greater than 50% of this time was spent in counseling or coordinating care  with the patient regarding goals of hospitalization, psycho-education, and discharge planning needs.

## 2023-09-26 NOTE — Group Note (Signed)
Recreation Therapy Group Note   Group Topic:Team Building  Group Date: 09/26/2023 Start Time: 1007 End Time: 1032 Facilitators: Nahome Bublitz-McCall, LRT,CTRS Location: [address] [employer]   Group Topic: Team Building, Problem Solving  Goal Area(s) Addresses:  Patient will effectively work with peer towards shared goal.  Patient will identify skills used to make activity successful.  Patient will identify how skills used during activity can be applied to reach post d/c goals.   Intervention: STEM Activity- Glass blower/designer  Group Description: Tallest Pharmacist, community. In teams of 5-6, patients were given 11 craft pipe cleaners. Using the materials provided, patients were instructed to compete again the opposing team(s) to build the tallest free-standing structure from floor level. The activity was timed; difficulty increased by Clinical research associate as Production designer, theatre/television/film continued.  Systematically resources were removed with additional directions for example, placing one arm behind their back, working in silence, and shape stipulations. LRT facilitated post-activity discussion reviewing team processes and necessary communication skills involved in completion. Patients were encouraged to reflect how the skills utilized, or not utilized, in this activity can be incorporated to positively impact support systems post discharge.  Education: Pharmacist, community, Scientist, physiological, Discharge Planning   Education Outcome: Acknowledges education/In group clarification offered/Needs additional education.    Affect/Mood: N/A   Participation Level: Did not attend    Clinical Observations/Individualized Feedback:     Plan: Continue to engage patient in RT group sessions 2-3x/week.   Caulder Wehner-McCall, LRT,CTRS 09/26/2023 1:00 PM

## 2023-09-26 NOTE — Progress Notes (Signed)
Pt visible in dayroom at the beginning of this shift. Observed with blunted affect, avertive to brief eye contact, pleasant with spontaneous speech on interactions. Pt is A & O to self, place, time and situation. He denies SI, HI, AVH and pain when assessed and did appear to be actively responding at the time. Compliant with medications when offered, denies adverse drug reactions when assessed. Tolerated breakfast well. Pt remains on 1:1 observation for safety (falls, seizures) as ordered without incident thus far. Support, encouragement and reassurance offered to pt.

## 2023-09-26 NOTE — Plan of Care (Signed)
  Problem: Education: Goal: Emotional status will improve Outcome: Progressing Goal: Mental status will improve Outcome: Progressing   Problem: Activity: Goal: Sleeping patterns will improve Outcome: Progressing   Problem: Health Behavior/Discharge Planning: Goal: Compliance with treatment plan for underlying cause of condition will improve Outcome: Progressing   

## 2023-09-26 NOTE — Progress Notes (Signed)
Pt cooperative with EKG procedure when approached.. Tolerated lunch and fluids well when offered. Cooperative with use of walker in milieu, room to bathroom without issues. Pleasant on interactions. Report he missed his room at the group home but has liked living there so far. Pt denies concerns at this time. 1:1 observation maintained for safety without incident thus far.

## 2023-09-26 NOTE — [facility] Group Notes (Signed)
[facility] Group Notes:  (Nursing/MHT/Case Management/Adjunct) Adult Psychoeducational Group Note  Date:  09/26/2023 Time:  10:33 AM  Group Topic/Focus:  Goals Group:   The focus of this group is to help patients establish daily goals to achieve during treatment and discuss how the patient can incorporate goal setting into their daily lives to aide in recovery. Orientation:   The focus of this group is to educate the patient on the purpose and policies of crisis stabilization and provide a format to answer questions about their admission.  The group details unit policies and expectations of patients while admitted.  Participation Level:  Did Not Attend    Additional Comments:  Did not attend  Brady Hoffman 09/26/2023, 10:33 AM

## 2023-09-26 NOTE — Progress Notes (Signed)
1:1 Note: Patient maintained on constant supervision for safety.  Patient is alert and oriented x 4.  Medication given as prescribed.  Routine safety checks maintained. Food and fluid intake adequate.  Ambulating on the unit without difficulty.

## 2023-09-27 MED ORDER — BENZTROPINE MESYLATE 1 MG PO TABS
2.0000 mg | ORAL_TABLET | Freq: Two times a day (BID) | ORAL | Status: DC
  Administered 2023-09-27 – 2023-09-29 (×5): 2 mg via ORAL
  Filled 2023-09-27 (×8): qty 2

## 2023-09-27 MED ORDER — LORAZEPAM 1 MG PO TABS
1.0000 mg | ORAL_TABLET | Freq: Once | ORAL | Status: AC
  Administered 2023-09-27: 1 mg via ORAL
  Filled 2023-09-27: qty 1

## 2023-09-27 MED ORDER — BENZTROPINE MESYLATE 1 MG/ML IJ SOLN
1.0000 mg | Freq: Once | INTRAMUSCULAR | Status: AC
  Administered 2023-09-27: 1 mg via INTRAMUSCULAR
  Filled 2023-09-27: qty 2
  Filled 2023-09-27: qty 1

## 2023-09-27 MED ORDER — CLOZAPINE 25 MG PO TABS
275.0000 mg | ORAL_TABLET | Freq: Every day | ORAL | Status: DC
  Administered 2023-09-28: 275 mg via ORAL
  Filled 2023-09-27 (×2): qty 3

## 2023-09-27 MED ORDER — CLOZAPINE 100 MG PO TABS
200.0000 mg | ORAL_TABLET | Freq: Every day | ORAL | Status: AC
  Administered 2023-09-27: 200 mg via ORAL
  Filled 2023-09-27: qty 2

## 2023-09-27 MED ORDER — CLOZAPINE 25 MG PO TABS
150.0000 mg | ORAL_TABLET | Freq: Once | ORAL | Status: AC
  Administered 2023-09-27: 150 mg via ORAL
  Filled 2023-09-27: qty 2

## 2023-09-27 MED ORDER — BENZTROPINE MESYLATE 1 MG PO TABS
1.0000 mg | ORAL_TABLET | Freq: Two times a day (BID) | ORAL | Status: DC
  Filled 2023-09-27 (×3): qty 1

## 2023-09-27 MED ORDER — CLOZAPINE 100 MG PO TABS
200.0000 mg | ORAL_TABLET | Freq: Every day | ORAL | Status: DC
  Administered 2023-09-28 – 2023-10-06 (×9): 200 mg via ORAL
  Filled 2023-09-27 (×11): qty 2

## 2023-09-27 NOTE — Progress Notes (Signed)
1:1 Note: Patient resting with no distress noted. EKG complete and on the chart. 1:1 monitoring continues. Patient remains safe at this time.

## 2023-09-27 NOTE — Plan of Care (Signed)
°  Problem: Education: °Goal: Emotional status will improve °Outcome: Progressing °Goal: Mental status will improve °Outcome: Progressing °Goal: Verbalization of understanding the information provided will improve °Outcome: Progressing °  °

## 2023-09-27 NOTE — Progress Notes (Signed)
Nursing 1:1 note D:Pt observed sleeping in bed with eyes closed. RR even and unlabored. No distress noted. A: 1:1 observation continues for safety  R: pt remains safe  

## 2023-09-27 NOTE — Progress Notes (Signed)
1:1 Note: During visitation with mother, patient became increasingly agitated and anxious, pulling his pants down and sitting and standing. Patient was unable to be verbally redirected and agitation protocol administered at 1024. Patient became diaphoretic, increasingly restless, tremulous, and anxious. Medications administered, per MAR. Patient calm at this time and resting in his room. 1:1 monitoring continues. Patient remains safe at this time.

## 2023-09-27 NOTE — Progress Notes (Signed)
1:1 Note: Patient visiting with mother.Patient preoccupied with clothing, taking off and putting them on. Patient concerned about germs. Patient offered emotional support. Scheduled meds administered, per MAR. 1:1 monitoring continues. Patient remains safe at this time.

## 2023-09-27 NOTE — Progress Notes (Signed)
   09/27/23 2123  Psych Admission Type (Psych Patients Only)  Admission Status Voluntary  Psychosocial Assessment  Patient Complaints Anxiety  Eye Contact Brief  Facial Expression Anxious  Affect Appropriate to circumstance  Speech Soft  Interaction Other (Comment) (pt mostly sleeping)  Motor Activity Other (Comment) (pt in bed)  Appearance/Hygiene Disheveled  Behavior Characteristics Cooperative  Mood Anxious  Thought Process  Coherency WDL  Content WDL  Delusions None reported or observed  Perception WDL  Hallucination None reported or observed  Judgment Impaired  Confusion None  Danger to Self  Current suicidal ideation? Denies  Danger to Others  Danger to Others None reported or observed   1:1 notes  09/27/2023 @ 2200  D: Patient sleeping since beginning of shift from medications given earlier. Woke pt up for medication. Pt denies pain and refused food offered.  A: Medication administered as prescribed.1:1 observation continue for safety R: Patient remains safe. Sitter at bedside. 1:1 continues

## 2023-09-27 NOTE — [facility] Group Notes (Signed)
[facility] Group Notes:  (Nursing/MHT/Case Management/Adjunct)  Date:  09/27/2023  Time:  8:46 PM  Type of Therapy:   Wrap-up group  Participation Level:  Did Not Attend  Participation Quality:    Affect:    Cognitive:    Insight:    Engagement in Group:    Modes of Intervention:    Summary of Progress/Problems: Pt refused to attend group. Writer provided him with a worksheet on stress.  Brady Hoffman 09/27/2023, 8:46 PM

## 2023-09-28 LAB — COMPREHENSIVE METABOLIC PANEL
ALT: 21 U/L (ref 0–44)
AST: 20 U/L (ref 15–41)
Albumin: 3.9 g/dL (ref 3.5–5.0)
Alkaline Phosphatase: 133 U/L — ABNORMAL HIGH (ref 38–126)
Anion gap: 10 (ref 5–15)
BUN: 16 mg/dL (ref 6–20)
CO2: 26 mmol/L (ref 22–32)
Calcium: 8.8 mg/dL — ABNORMAL LOW (ref 8.9–10.3)
Chloride: 103 mmol/L (ref 98–111)
Creatinine, Ser: 1.05 mg/dL (ref 0.61–1.24)
GFR, Estimated: >60 mL/min (ref >60)
Glucose, Bld: 115 mg/dL — ABNORMAL HIGH (ref 70–99)
Potassium: 4 mmol/L (ref 3.5–5.1)
Sodium: 139 mmol/L (ref 135–145)
Total Bilirubin: 0.4 mg/dL (ref 0.3–1.2)
Total Protein: 7 g/dL (ref 6.5–8.1)

## 2023-09-28 LAB — CBC WITH DIFFERENTIAL/PLATELET
Abs Immature Granulocytes: 0.01 10*3/uL (ref 0.00–0.07)
Basophils Absolute: 0 10*3/uL (ref 0.0–0.1)
Basophils Relative: 1 %
Eosinophils Absolute: 0.1 10*3/uL (ref 0.0–0.5)
Eosinophils Relative: 2 %
HCT: 40.3 % (ref 39.0–52.0)
Hemoglobin: 13.4 g/dL (ref 13.0–17.0)
Immature Granulocytes: 0 %
Lymphocytes Relative: 24 %
Lymphs Abs: 1.4 10*3/uL (ref 0.7–4.0)
MCH: 31.3 pg (ref 26.0–34.0)
MCHC: 33.3 g/dL (ref 30.0–36.0)
MCV: 94.2 fL (ref 80.0–100.0)
Monocytes Absolute: 0.5 10*3/uL (ref 0.1–1.0)
Monocytes Relative: 9 %
Neutro Abs: 3.6 10*3/uL (ref 1.7–7.7)
Neutrophils Relative %: 64 %
Platelets: 192 10*3/uL (ref 150–400)
RBC: 4.28 MIL/uL (ref 4.22–5.81)
RDW: 11.9 % (ref 11.5–15.5)
WBC: 5.7 10*3/uL (ref 4.0–10.5)
nRBC: 0 % (ref 0.0–0.2)

## 2023-09-28 LAB — CK: Total CK: 175 U/L (ref 49–397)

## 2023-09-28 NOTE — Progress Notes (Signed)
Pt remains 1:1 with staff for safety.  Pt did ambulate in hall with walker and staff. Pts gait was slow an d steady. Pt continues to isolate in his room. Pt has not showered. Pt did eat lunch.

## 2023-09-28 NOTE — Progress Notes (Addendum)
[facility] MD Progress Note  09/28/2023 8:45 AM [name]  MRN:  [id number]  Principal Problem: Schizoaffective disorder, depressive type (HCC) Diagnosis: Principal Problem:   Schizoaffective disorder, depressive type (HCC)  Reason for Admission:  Brady Hoffman is a 57 year old Caucasian male with prior psychiatric history significant for schizoaffective disorder, seizure disorder, developmental delay, alcohol use disorder, who presents voluntarily [facility] from [facility] health ED at [facility] for evaluation for his mental health illness and frequent incidence of falls. After medical evaluation/stabilization & clearance, he was transferred to the [facility] for further psychiatric evaluation & treatments (admitted on 09/21/2023, total  LOS: 7 days )  Chart Review from last 24 hours:  The patient's chart was reviewed and nursing notes were reviewed. The patient's case was discussed in multidisciplinary team meeting.   - Overnight events to report per chart review / staff report:  per nursing sleeping 11 hours  - Patient received all scheduled medications - Patient did not receive any PRN medications  Information Obtained Today During Patient Interview: The patient was seen and evaluated on the unit. On assessment today the patient was seen laying comfortably in bed. He shares he had an episode of thinking he had "germs" this morning, but not at present.  He denies feeling depressed or anxious. He endorses sleeping well and eating well.  Patient denies SI and HI. He denies AVH.  Past Psychiatric History:  Previous Psych Diagnoses: Panic disorder with agoraphobia, history of seizures, history of alcohol use disorder, schizoaffective disorder Prior inpatient treatment: Was admitted to [facility] 5 years ago Current/prior outpatient treatment: Yes with Dr. Jannifer Franklin Prior rehab hx: Denies Psychotherapy hx: Yes History of suicide: Denies History of homicide or aggression: Denies Psychiatric  medication history: Patient has been on trial Seroquel, Prolixin, Effexor, and Haldol Psychiatric medication compliance history: Compliance Neuromodulation history: Denies Current Psychiatrist: Dr. Jannifer Franklin Current therapist: Dr. 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    Pervasive developmental disorder    Schizoaffective disorder    Seizures (HCC)    Family History: Medical: Patient unsure Psych: Patient unsure Psych Rx: Patient unsure SA/HA: Patient unsure Substance use family hx: Patient unsure  Social History:  Childhood (bring, raised, lives now, parents, siblings, schooling, education): Some college Abuse: Denies Marital Status: Denies Sexual orientation: Male from birth Children: No children Employment: Unemployed Peer Group: Denies peer group Housing: Lives at the group home Finances: Some financial difficulty Legal: Denies 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 [employer]  Current Medications: Current Facility-Administered Medications  Medication Dose Route Frequency Provider Last Rate Last Admin   acetaminophen (TYLENOL) tablet 650 mg  650 mg Oral Q6H PRN Dahlia Byes C, NP       alum & mag hydroxide-simeth (MAALOX/MYLANTA) 200-200-20 MG/5ML suspension 30 mL  30 mL Oral Q4H PRN Dahlia Byes C, NP       aspirin chewable tablet 81 mg  81 mg Oral Daily Onuoha, Josephine C, NP   81 mg at 09/28/23 0842   atorvastatin (LIPITOR) tablet 20 mg  20 mg Oral QHS Onuoha, Josephine C, NP   20 mg at 09/27/23 2111   atropine 1 % ophthalmic solution 1 drop  1 drop Sublingual QHS Kizzie Ide B, MD   1 drop at 09/27/23 2122  benztropine (COGENTIN) tablet 2 mg  2 mg Oral BID Augusto Gamble, MD   2 mg at 09/28/23 1610   bismuth subsalicylate (PEPTO BISMOL) chewable tablet 524  mg  524 mg Oral Q3H PRN Augusto Gamble, MD       cloZAPine (CLOZARIL) tablet 200 mg  200 mg Oral Daily Rex Kras, MD   200 mg at 09/28/23 9604   cloZAPine (CLOZARIL) tablet 275 mg  275 mg Oral QHS Rex Kras, MD       diphenhydrAMINE (BENADRYL) capsule 50 mg  50 mg Oral TID PRN Dahlia Byes C, NP   50 mg at 09/27/23 1024   Or   diphenhydrAMINE (BENADRYL) injection 50 mg  50 mg Intramuscular TID PRN Earney Navy, NP       docusate sodium (COLACE) capsule 100 mg  100 mg Oral Daily Welford Roche, Josephine C, NP   100 mg at 09/28/23 0843   FLUoxetine (PROZAC) capsule 60 mg  60 mg Oral Daily Massengill, Harrold Donath, MD   60 mg at 09/28/23 0843   influenza vac split trivalent PF (FLULAVAL) injection 0.5 mL  0.5 mL Intramuscular Tomorrow-1000 Massengill, Harrold Donath, MD       levETIRAcetam (KEPPRA) tablet 500 mg  500 mg Oral BID Dahlia Byes C, NP   500 mg at 09/28/23 0843   levothyroxine (SYNTHROID) tablet 50 mcg  50 mcg Oral Q0600 Dahlia Byes C, NP   50 mcg at 09/28/23 0708   LORazepam (ATIVAN) tablet 2 mg  2 mg Oral TID PRN Dahlia Byes C, NP   2 mg at 09/27/23 1023   Or   LORazepam (ATIVAN) injection 2 mg  2 mg Intramuscular TID PRN Dahlia Byes C, NP       LORazepam (ATIVAN) tablet 0.5 mg  0.5 mg Oral Q8H PRN Dahlia Byes C, NP   0.5 mg at 09/21/23 2131   LORazepam (ATIVAN) tablet 0.5 mg  0.5 mg Oral Q8H Massengill, Nathan, MD   0.5 mg at 09/28/23 0708   ondansetron (ZOFRAN) tablet 8 mg  8 mg Oral Q8H PRN Augusto Gamble, MD       pantoprazole (PROTONIX) EC tablet 80 mg  80 mg Oral Daily Onuoha, Josephine C, NP   80 mg at 09/28/23 0843   polyethylene glycol (MIRALAX / GLYCOLAX) packet 17 g  17 g Oral Daily PRN Augusto Gamble, MD       senna (SENOKOT) tablet 8.6 mg  1 tablet Oral QHS PRN Augusto Gamble, MD        Lab Results: No results found for this or any previous visit (from the past 48 hour(s)).  Blood Alcohol level:  Lab Results  Component Value Date   ETH <10  09/19/2023   ETH <10 09/28/2019    Metabolic Labs: Lab Results  Component Value Date   HGBA1C 5.7 (H) 09/23/2023   MPG 116.89 09/23/2023   MPG 114 02/25/2014   No results found for: "PROLACTIN" Lab Results  Component Value Date   CHOL 144 07/03/2023   TRIG 152 (H) 07/03/2023   HDL 37 (L) 07/03/2023   CHOLHDL 3.9 07/03/2023   VLDL 28 10/20/2009   LDLCALC 82 07/03/2023   LDLCALC 59 10/02/2021    Physical Findings: AIMS: AIMS: Facial and Oral Movements Muscles of Facial Expression: None Lips and Perioral Area: None Jaw: None Tongue: None,Extremity Movements Upper (arms, wrists, hands, fingers): None Lower (legs, knees, ankles, toes): None, Trunk Movements Neck, shoulders, hips: None, Global Judgements Severity of abnormal movements overall : None  Incapacitation due to abnormal movements: None Patient's awareness of abnormal movements: No Awareness, Dental Status Current problems with teeth and/or dentures?: No Does patient usually wear dentures?: No Edentia?: No  Total score: 0  Tremors:No Cogwheeling:No Rigidity:No   CIWA:    COWS:     Psychiatric Specialty Exam: General Appearance:  Appropriate for Environment; Fairly Groomed   Eye Contact:  Good   Speech:  Clear and Coherent   Volume:  Normal   Mood:  -- ("good")   Affect:  Appropriate; Congruent; Flat   Thought Content:  WDL   Suicidal Thoughts:  Suicidal Thoughts: No    Homicidal Thoughts:  Homicidal Thoughts: No    Thought Process:  Coherent; Goal Directed; Linear   Orientation:  Full (Time, Place and Person)     Memory:  Immediate Good; Recent Good; Remote Good   Judgment:  Good   Insight:  Lacking   Concentration:  Good   Recall:  Good   Fund of Knowledge:  Good   Language:  Good   Psychomotor Activity:  Psychomotor Activity: Normal Extrapyramidal Side Effects (EPS): -- (none) AIMS Completed?: Yes    Assets:  Resilience   Sleep:  Sleep: Good      Review of Systems Review of Systems  Constitutional: Negative.   Respiratory: Negative.    Cardiovascular: Negative.   Gastrointestinal: Negative.   Genitourinary: Negative.   Psychiatric/Behavioral:         Psychiatric subjective data addressed in PSE or HPI / daily subjective report    Vital Signs: Blood pressure 115/83, pulse 90, temperature 97.8 F (36.6 C), temperature source Oral, resp. rate 18, height 6\' 3"  (1.905 m), weight 94.8 kg, SpO2 96%. Body mass index is 26.12 kg/m. Physical Exam Vitals and nursing note reviewed.  HENT:     Head: Normocephalic and atraumatic.  Pulmonary:     Effort: Pulmonary effort is normal.  Musculoskeletal:     Cervical back: Normal range of motion.  Neurological:     General: No focal deficit present.     Mental Status: He is alert.    Assets  Assets: Resilience   Treatment Plan Summary: Daily contact with patient to assess and evaluate symptoms and progress in treatment and Medication management  Diagnoses / Active Problems: Schizoaffective disorder, depressive type (HCC) Principal Problem:   Schizoaffective disorder, depressive type (HCC)   ASSESSMENT: Brady Hoffman is a 57 year old Caucasian male with prior psychiatric history significant for schizoaffective disorder, seizure disorder, developmental delay, alcohol use disorder, who presents voluntarily [facility] from [facility] health ED at [facility] for evaluation for his mental health illness and frequent incidence of falls. After medical evaluation/stabilization & clearance, he was transferred to the [facility] for further psychiatric evaluation & treatments  Patient developed worsening symptoms of OCD yesterday and developed cholinergic symptoms suspected to be secondary to administration of haloperidol as part of agitation protocol superimposed on rapid decrease in clozapine dose. Today, patient appears much improved with no cholinergic symptoms noted. No cogwheeling or rigidity  noted today.  PLAN: Safety and Monitoring:  -- Voluntary admission to inpatient psychiatric unit for safety, stabilization and treatment  -- Daily contact with patient to assess and evaluate symptoms and progress in treatment  -- Patient's case to be discussed in multi-disciplinary team meeting  -- Observation Level : q15 minute checks  -- Vital signs:  q12 hours  -- Precautions: suicide, elopement, and assault  2. Interventions (medications, psychoeducation, etc):  -- continue clozapine 200 mg daily in the  morning and 275 mg daily at bedtime -- continue benztropine 2 mg twice daily for cholinergic symptoms  -- continue fluoxetine 60 mg daily for depressive symptoms, anxiety  -- continue lorazepam 0.5 mg every 8 hours for anxiety  -- removed haloperidol from agitation protocol due to worsening cholinergic symptoms  -- medical management: atropine at bedtime for drooling, aspirin, atorvastatin, docusate sodium, levetiracetam, levothyroxine, and pantoprazole  -- Patient does not need nicotine replacement  PRN medications for symptomatic management:              -- lorazepam 0.5 mg every 8 hours as needed for anxiety              -- continue acetaminophen 650 mg every 6 hours as needed for mild to moderate pain, fever, and headaches              -- continue bismuth subsalicylate 524 mg oral chewable tablet every 3 hours as needed for diarrhea / loose stools              -- continue senna 8.6 mg oral at bedtime and polyethylene glycol 17 g oral daily as needed for mild to moderate constipation              -- continue ondansetron 8 mg every 8 hours as needed for nausea or vomiting              -- continue aluminum-magnesium hydroxide + simethicone 30 mL every 4 hours as needed for heartburn or indigestion  -- As needed agitation protocol in-place  The risks/benefits/side-effects/alternatives to the above medication were discussed in detail with the patient and time was given for questions.  The patient consents to medication trial. FDA black box warnings, if present, were discussed.  The patient is agreeable with the medication plan, as above. We will monitor the patient's response to pharmacologic treatment, and adjust medications as necessary.  3. Routine and other pertinent labs:             -- Metabolic profile:  BMI: Body mass index is 26.12 kg/m.  Prolactin: No results found for: "PROLACTIN"  Lipid Panel: Lab Results  Component Value Date   CHOL 144 07/03/2023   TRIG 152 (H) 07/03/2023   HDL 37 (L) 07/03/2023   CHOLHDL 3.9 07/03/2023   VLDL 28 10/20/2009   LDLCALC 82 07/03/2023   LDLCALC 59 10/02/2021    HbgA1c: Hgb A1c MFr Bld (%)  Date Value  09/23/2023 5.7 (H)    TSH: TSH  Date Value  09/24/2023 2.765 uIU/mL  07/03/2023 4.03 mIU/L    EKG monitoring: QTc: 474 on 09/26/2023  4. Group Therapy:  -- Encouraged patient to participate in unit milieu and in scheduled group therapies   -- Short Term Goals: Ability to identify changes in lifestyle to reduce recurrence of condition, verbalize feelings, identify and develop effective coping behaviors, maintain clinical measurements within normal limits, and identify triggers associated with substance abuse/mental health issues will improve. Improvement in ability to demonstrate self-control and comply with prescribed medications.  -- Long Term Goals: Improvement in symptoms so as ready for discharge -- Patient is encouraged to participate in group therapy while admitted to the psychiatric unit. -- We will address other chronic and acute stressors, which contributed to the patient's Schizoaffective disorder, depressive type (HCC) in order to reduce the risk of self-harm at discharge.  5. Discharge Planning:   -- Social work and case management to assist with discharge planning and identification of  hospital follow-up needs prior to discharge  -- Estimated LOS: 4 days  -- Discharge Concerns: Need to  establish a safety plan; Medication compliance and effectiveness  -- Discharge Goals: Return home with outpatient referrals for mental health follow-up including medication management/psychotherapy  I certify that inpatient services furnished can reasonably be expected to improve the patient's condition.   Signed: Augusto Gamble, MD 09/28/2023, 8:45 AM

## 2023-09-28 NOTE — Progress Notes (Signed)
Pt in bed this morning. Pt is medication compliant. Pt refusing to get out of bed at this time and walk halls with staff or attend group. Nurse educated pt on importance of mobility. Pt remains 1:1 with staff for safety. Pt mother visited this morning and is requesting to speak with provider. Pt presents with flat affect. Pt has slight bilateral hand tremor, no EPS noted. Pt denies SI/HI/AVH. Hygiene encouraged. Pt noted with bilateral big toes discolored. Pt denies pain. Big toe on left foot noted with malodorous drainage. Provider notified. Fluids and ambulation encouraged.

## 2023-09-28 NOTE — Plan of Care (Signed)
  Problem: Activity: Goal: Interest or engagement in activities will improve Outcome: Not Progressing Goal: Sleeping patterns will improve Outcome: Progressing

## 2023-09-28 NOTE — Progress Notes (Signed)
Patient ID: Brady Hoffman, male   DOB: [birth year], 57 y.o.   MRN: [id number] 1:1 notes  09/28/2023 @ 0200  D: Patient in bed sleeping. Respiration regular and unlabored. No sign of distress noted at this time A: 1:1 observation for safety R: Patient remains asleep. Sitter at bedside. 1:1 continues

## 2023-09-28 NOTE — Progress Notes (Signed)
Pt ate dinner and had blood drawn this evening. Pt layed in bed all day but did walk 2 more times with staff for a total of 3 times today with encouragement. Pt presents with sad affect, denies SI/HI/AVH. Isolative. Doesn't attend groups. Needs encouragement to perform ADLs. Fluids encouraged.

## 2023-09-28 NOTE — Group Note (Signed)
Date:  09/28/2023 Time:  1:25 PM  Group Topic/Focus:  Goals Group:   The focus of this group is to help patients establish daily goals to achieve during treatment and discuss how the patient can incorporate goal setting into their daily lives to aide in recovery.    Participation Level:  Did Not Attend  Additional Comments:  Did not attend.  Gwenevere Abbot Ramsay 09/28/2023, 1:25 PM

## 2023-09-28 NOTE — Plan of Care (Signed)
  Problem: Activity: Goal: Sleeping patterns will improve Outcome: Progressing   Problem: Safety: Goal: Periods of time without injury will increase Outcome: Progressing   Problem: Activity: Goal: Interest or engagement in activities will improve Outcome: Not Progressing

## 2023-09-28 NOTE — Progress Notes (Signed)
Nursing 1:1 note D:Pt observed sitting in bed with eyes open. RR even and unlabored. No distress noted. A: 1:1 observation continues for safety  R: pt remains safe  

## 2023-09-28 NOTE — Progress Notes (Signed)
Patient ID: Brady Hoffman, male   DOB: 12-May-1966, 57 y.o.   MRN: [id number] Woke pt up for breakfast. Pt appears calm but tremors. Pt stated he feel better. Pt ate about 50% of breakfast. Medication administered as prescribed. Pt expresses any other concern at this time.

## 2023-09-28 NOTE — B[facility] Group Notes (Signed)
Pt did not attend wrap-up group   

## 2023-09-28 NOTE — Progress Notes (Signed)
   09/28/23 2130  Psych Admission Type (Psych Patients Only)  Admission Status Voluntary  Psychosocial Assessment  Patient Complaints Anxiety  Eye Contact Fair  Facial Expression Sad;Flat  Affect Anxious  Speech Logical/coherent  Interaction Minimal  Motor Activity Tremors  Appearance/Hygiene Disheveled  Behavior Characteristics Resistant to care  Mood Anxious  Aggressive Behavior  Effect No apparent injury  Thought Process  Coherency WDL  Content WDL  Delusions WDL  Perception WDL  Hallucination None reported or observed  Judgment Impaired  Confusion WDL  Danger to Self  Current suicidal ideation? Denies

## 2023-09-28 NOTE — Progress Notes (Signed)
Patient ID: Brady Hoffman, male   DOB: 06/22/1966, 57 y.o.   MRN: [id number] 1:1 notes  09/28/2023 @ 0600  D: Patient in bed sleeping. Respiration regular and unlabored. No sign of distress noted at this time A: 1:1 observation for safety. R: Patient remains asleep. Sitter at bedside. 1:1 continues

## 2023-09-28 NOTE — Plan of Care (Signed)

## 2023-09-29 MED ORDER — CLOZAPINE 25 MG PO TABS
250.0000 mg | ORAL_TABLET | Freq: Every day | ORAL | Status: DC
  Administered 2023-09-29 – 2023-10-01 (×3): 250 mg via ORAL
  Filled 2023-09-29 (×4): qty 2

## 2023-09-29 MED ORDER — MUPIROCIN CALCIUM 2 % EX CREA
TOPICAL_CREAM | Freq: Every day | CUTANEOUS | Status: DC
  Administered 2023-09-29 – 2023-10-11 (×6): 1 via TOPICAL
  Filled 2023-09-29 (×2): qty 15

## 2023-09-29 MED ORDER — BENZTROPINE MESYLATE 1 MG PO TABS
1.0000 mg | ORAL_TABLET | Freq: Two times a day (BID) | ORAL | Status: DC
  Administered 2023-09-29 – 2023-10-01 (×4): 1 mg via ORAL
  Filled 2023-09-29 (×8): qty 1

## 2023-09-29 NOTE — Progress Notes (Signed)
   09/29/23 2045  Psych Admission Type (Psych Patients Only)  Admission Status Voluntary  Psychosocial Assessment  Patient Complaints Anxiety  Eye Contact Fair  Facial Expression Sad;Flat  Affect Anxious  Speech Logical/coherent  Interaction Minimal  Motor Activity Tremors  Appearance/Hygiene Disheveled  Behavior Characteristics Cooperative;Anxious  Mood Anxious  Aggressive Behavior  Effect No apparent injury  Thought Process  Coherency WDL  Content WDL  Delusions WDL  Perception WDL  Hallucination None reported or observed  Judgment Impaired  Confusion WDL  Danger to Self  Current suicidal ideation? Denies

## 2023-09-29 NOTE — [facility] Group Notes (Signed)
Pt refused to attend wrap up group 

## 2023-09-29 NOTE — Plan of Care (Signed)
°  Problem: Education: °Goal: Emotional status will improve °Outcome: Progressing °Goal: Mental status will improve °Outcome: Progressing °Goal: Verbalization of understanding the information provided will improve °Outcome: Progressing °  °

## 2023-09-29 NOTE — Group Note (Signed)
Recreation Therapy Group Note   Group Topic:Goal Setting  Group Date: 09/29/2023 Start Time: 1015 End Time: 1050 Facilitators: Jennie Bolar-McCall, LRT,CTRS Location: [address] Dayroom   Goal Area(s) Addresses:  Patient will effectively work with peer towards shared goal.  Patient will identify skills used to make activity successful.  Patient will identify how skills used during activity can be used to reach post d/c goals.   Intervention: STEM Activity  Group Description: Stage manager. In teams of 3-5, patients were given 12 plastic drinking straws and an equal length of masking tape. Using the materials provided, patients were asked to build a landing pad to catch a golf ball dropped from approximately 5 feet in the air. All materials were required to be used by the team in their design. LRT facilitated post-activity discussion.  Education: Pharmacist, community, Scientist, physiological, Discharge Planning   Education Outcome: Acknowledges education/In group clarification offered/Needs additional education.    Affect/Mood: N/A   Participation Level: Did not attend    Clinical Observations/Individualized Feedback:     Plan: Continue to engage patient in RT group sessions 2-3x/week.   Brady Hoffman, LRT,CTRS 09/29/2023 1:18 PM

## 2023-09-29 NOTE — Care Management Important Message (Signed)
Medicare IM given to Silas Flood, LCSW to give to the patient.

## 2023-09-29 NOTE — Progress Notes (Signed)
Nursing 1:1 note D:Pt observed sitting in bed with eyes open. RR even and unlabored. No distress noted. A: 1:1 observation continues for safety  R: pt remains safe  

## 2023-09-29 NOTE — Plan of Care (Signed)
  Problem: Education: Goal: Emotional status will improve Outcome: Progressing   Problem: Activity: Goal: Interest or engagement in activities will improve Outcome: Progressing Goal: Sleeping patterns will improve Outcome: Progressing   Problem: Safety: Goal: Periods of time without injury will increase Outcome: Progressing

## 2023-09-29 NOTE — Group Note (Signed)
LCSW Group Therapy Note  Group Date: 09/29/2023 Start Time: 1300 End Time: 1400   Type of Therapy and Topic:  Group Therapy - How To Cope with Nervousness about Discharge   Participation Level:  Did Not Attend   Description of Group This process group involved identification of patients' feelings about discharge. Some of them are scheduled to be discharged soon, while others are new admissions, but each of them was asked to share thoughts and feelings surrounding discharge from the hospital. One common theme was that they are excited at the prospect of going home, while another was that many of them are apprehensive about sharing why they were hospitalized. Patients were given the opportunity to discuss these feelings with their peers in preparation for discharge.  Therapeutic Goals  Patient will identify their overall feelings about pending discharge. Patient will think about how they might proactively address issues that they believe will once again arise once they get home (i.e. with parents). Patients will participate in discussion about having hope for change.   Summary of Patient Progress: Patient did not attend but was highly encouraged to attend   Therapeutic Modalities Cognitive Behavioral Therapy   Beather Arbour 09/29/2023  4:02 PM

## 2023-09-29 NOTE — Progress Notes (Signed)
[facility] MD Progress Note  09/29/2023 9:49 AM Brady Hoffman  MRN:  [id number]  Principal Problem: Schizoaffective disorder, depressive type (HCC) Diagnosis: Principal Problem:   Schizoaffective disorder, depressive type (HCC)  Reason for Admission:  Brady Hoffman is a 57 year old Caucasian male with prior psychiatric history significant for schizoaffective disorder, seizure disorder, developmental delay, alcohol use disorder, who presents voluntarily [facility] from [facility] health ED at [facility] for evaluation for his mental health illness and frequent incidence of falls. After medical evaluation/stabilization & clearance, he was transferred to the [facility] for further psychiatric evaluation & treatments (admitted on 09/21/2023, total  LOS: 8 days )  Chart Review from last 24 hours:  The patient's chart was reviewed and nursing notes were reviewed. Vitals signs: stable. The patient's case was discussed in multidisciplinary team meeting. Per [facility], patient was taking medications appropriately. The following as needed medications were given: none. Per nursing, patient is appropriate and attended no group sessions.    Information Obtained Today During Patient Interview: The patient was seen in his bed, no acute distress. On assessment, the patient feels "better" today. Patient reports not going to the group sessions due to having difficulty getting out of bed. When asked about speaking with family or friends, he reports speaking with his mother. Patient reports having fair sleep and fair appetite. He denies constipation and reports his most recent bowel movement being yesterday. He denies diaphoresis and dizziness. Patient feels that the medications have been helpful with the thoughts about germs. When asked about depression, patient reports it has been slightly improving. He reports his medications were being managed by the group home prior to him coming to [facility].  Denies SI, HI. He initially reported VH  and stated he sometimes feels people come into his room but is unsure if there are actual people who come into his room. He denies AH.  Past Psychiatric History:  Previous Psych Diagnoses: Panic disorder with agoraphobia, history of seizures, history of alcohol use disorder, schizoaffective disorder Prior inpatient treatment: Was admitted to [facility] 5 years ago Current/prior outpatient treatment: Yes with Dr. Jannifer Franklin Prior rehab hx: Denies Psychotherapy hx: Yes History of suicide: Denies History of homicide or aggression: Denies Psychiatric medication history: Patient has been on trial Seroquel, Prolixin, Effexor, and Haldol Psychiatric medication compliance history: Compliance Neuromodulation history: Denies Current Psychiatrist: Dr. Jannifer Franklin Current therapist: Dr. 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    Pervasive developmental disorder    Schizoaffective disorder    Seizures (HCC)    Family History: Medical: Patient unsure Psych: Patient unsure Psych Rx: Patient unsure SA/HA: Patient unsure Substance use family hx: Patient unsure  Social History:  Childhood (bring, raised, lives now, parents, siblings, schooling, education): Some college Abuse: Denies Marital Status: Denies Sexual orientation: Male from birth Children: No children Employment: Unemployed Peer Group: Denies peer group Housing: Lives at the group home Finances: Some financial difficulty Legal: Denies 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 [employer]  Current Medications: Current Facility-Administered Medications  Medication Dose Route Frequency Provider Last Rate Last Admin   acetaminophen (TYLENOL) tablet 650 mg  650 mg Oral Q6H PRN Dahlia Byes C, NP       alum & mag hydroxide-simeth (MAALOX/MYLANTA)  200-200-20 MG/5ML suspension 30 mL  30 mL Oral Q4H PRN Dahlia Byes C, NP       aspirin chewable tablet 81 mg  81 mg Oral Daily Dahlia Byes C, NP   81 mg at 09/29/23 0805   atorvastatin (LIPITOR) tablet 20 mg  20 mg Oral QHS Onuoha, Josephine C, NP   20 mg at 09/28/23 2132   atropine 1 % ophthalmic solution 1 drop  1 drop Sublingual QHS Kizzie Ide B, MD   1 drop at 09/28/23 2133   benztropine (COGENTIN) tablet 2 mg  2 mg Oral BID Augusto Gamble, MD   2 mg at 09/29/23 0805   bismuth subsalicylate (PEPTO BISMOL) chewable tablet 524 mg  524 mg Oral Q3H PRN Augusto Gamble, MD       cloZAPine (CLOZARIL) tablet 200 mg  200 mg Oral Daily Rex Kras, MD   200 mg at 09/29/23 0805   cloZAPine (CLOZARIL) tablet 275 mg  275 mg Oral QHS Rex Kras, MD   275 mg at 09/28/23 2132   diphenhydrAMINE (BENADRYL) capsule 50 mg  50 mg Oral TID PRN Earney Navy, NP   50 mg at 09/27/23 1024   Or   diphenhydrAMINE (BENADRYL) injection 50 mg  50 mg Intramuscular TID PRN Earney Navy, NP       docusate sodium (COLACE) capsule 100 mg  100 mg Oral Daily Welford Roche, Josephine C, NP   100 mg at 09/29/23 0805   FLUoxetine (PROZAC) capsule 60 mg  60 mg Oral Daily Massengill, Harrold Donath, MD   60 mg at 09/29/23 0805   influenza vac split trivalent PF (FLULAVAL) injection 0.5 mL  0.5 mL Intramuscular Tomorrow-1000 Massengill, Harrold Donath, MD       levETIRAcetam (KEPPRA) tablet 500 mg  500 mg Oral BID Dahlia Byes C, NP   500 mg at 09/29/23 0805   levothyroxine (SYNTHROID) tablet 50 mcg  50 mcg Oral Q0600 Dahlia Byes C, NP   50 mcg at 09/29/23 4098   LORazepam (ATIVAN) tablet 2 mg  2 mg Oral TID PRN Dahlia Byes C, NP   2 mg at 09/27/23 1023   Or   LORazepam (ATIVAN) injection 2 mg  2 mg Intramuscular TID PRN Earney Navy, NP       LORazepam (ATIVAN) tablet 0.5 mg  0.5 mg Oral Q8H PRN Dahlia Byes C, NP   0.5 mg at 09/21/23 2131   LORazepam (ATIVAN) tablet 0.5 mg  0.5 mg Oral Q8H  Massengill, Harrold Donath, MD   0.5 mg at 09/29/23 0638   ondansetron (ZOFRAN) tablet 8 mg  8 mg Oral Q8H PRN Augusto Gamble, MD       pantoprazole (PROTONIX) EC tablet 80 mg  80 mg Oral Daily Dahlia Byes C, NP   80 mg at 09/29/23 0805   polyethylene glycol (MIRALAX / GLYCOLAX) packet 17 g  17 g Oral Daily PRN Augusto Gamble, MD       senna Mancel Parsons) tablet 8.6 mg  1 tablet Oral QHS PRN Augusto Gamble, MD        Lab Results:  Results for orders placed or performed during the hospital encounter of 09/21/23 (from the past 48 hour(s))  CBC with Differential/Platelet     Status: None   Collection Time: 09/28/23  6:32 PM  Result Value Ref Range   WBC 5.7 4.0 - 10.5 K/uL   RBC 4.28 4.22 - 5.81 MIL/uL   Hemoglobin 13.4 13.0 - 17.0 g/dL   HCT 11.9 14.7 - 82.9 %   MCV 94.2  80.0 - 100.0 fL   MCH 31.3 26.0 - 34.0 pg   MCHC 33.3 30.0 - 36.0 g/dL   RDW 01.0 27.2 - 53.6 %   Platelets 192 150 - 400 K/uL   nRBC 0.0 0.0 - 0.2 %   Neutrophils Relative % 64 %   Neutro Abs 3.6 1.7 - 7.7 K/uL   Lymphocytes Relative 24 %   Lymphs Abs 1.4 0.7 - 4.0 K/uL   Monocytes Relative 9 %   Monocytes Absolute 0.5 0.1 - 1.0 K/uL   Eosinophils Relative 2 %   Eosinophils Absolute 0.1 0.0 - 0.5 K/uL   Basophils Relative 1 %   Basophils Absolute 0.0 0.0 - 0.1 K/uL   Immature Granulocytes 0 %   Abs Immature Granulocytes 0.01 0.00 - 0.07 K/uL    Comment: Performed at [facility], [address], [place], [place] [address]  CK     Status: None   Collection Time: 09/28/23  6:32 PM  Result Value Ref Range   Total CK 175 49 - 397 U/L    Comment: Performed at [facility], [address], [place], [place] [address]  Comprehensive metabolic panel     Status: Abnormal   Collection Time: 09/28/23  6:32 PM  Result Value Ref Range   Sodium 139 135 - 145 mmol/L   Potassium 4.0 3.5 - 5.1 mmol/L   Chloride 103 98 - 111 mmol/L   CO2 26 22 - 32 mmol/L   Glucose, Bld 115 (H) 70 - 99 mg/dL     Comment: Glucose reference range applies only to samples taken after fasting for at least 8 hours.   BUN 16 6 - 20 mg/dL   Creatinine, Ser 9.56 0.61 - 1.24 mg/dL   Calcium 8.8 (L) 8.9 - 10.3 mg/dL   Total Protein 7.0 6.5 - 8.1 g/dL   Albumin 3.9 3.5 - 5.0 g/dL   AST 20 15 - 41 U/L   ALT 21 0 - 44 U/L   Alkaline Phosphatase 133 (H) 38 - 126 U/L   Total Bilirubin 0.4 0.3 - 1.2 mg/dL   GFR, Estimated >38 >75 mL/min    Comment: (NOTE) Calculated using the CKD-EPI Creatinine Equation (2021)    Anion gap 10 5 - 15    Comment: Performed at [facility], [address] [place], [place] [address]    Blood Alcohol level:  Lab Results  Component Value Date   [facility] <10 09/19/2023   ETH <10 09/28/2019    Metabolic Labs: Lab Results  Component Value Date   HGBA1C 5.7 (H) 09/23/2023   MPG 116.89 09/23/2023   MPG 114 02/25/2014   No results found for: "PROLACTIN" Lab Results  Component Value Date   CHOL 144 07/03/2023   TRIG 152 (H) 07/03/2023   HDL 37 (L) 07/03/2023   CHOLHDL 3.9 07/03/2023   VLDL 28 10/20/2009   LDLCALC 82 07/03/2023   LDLCALC 59 10/02/2021    Physical Findings: AIMS: AIMS: Facial and Oral Movements Muscles of Facial Expression: None Lips and Perioral Area: None Jaw: None Tongue: None,Extremity Movements Upper (arms, wrists, hands, fingers): None Lower (legs, knees, ankles, toes): None, Trunk Movements Neck, shoulders, hips: None, Global Judgements Severity of abnormal movements overall : None Incapacitation due to abnormal movements: None Patient's awareness of abnormal movements: No Awareness, Dental Status Current problems with teeth and/or dentures?: No Does patient usually wear dentures?: No Edentia?: No  Total score: 0  Tremors:No Cogwheeling: yes on  right wrist Rigidity:No   CIWA:    COWS:     Psychiatric Specialty Exam:  General Appearance: appears at stated age, casually dressed and groomed   Behavior: pleasant and  cooperative   Psychomotor Activity: no psychomotor agitation or retardation noted   Eye Contact: fair  Speech: normal amount, tone, volume and fluency    Mood: euthymic  Affect: congruent, restrictive  Thought Process: linear,  concrete, no circumstantial or tangential thought process noted, no racing thoughts or flight of ideas  Descriptions of Associations: intact   Thought Content Hallucinations: denies AH; reports VH-people coming in his room but may be actual people, does not appear responding to stimuli  Delusions: no paranoia, delusions of control, grandeur, ideas of reference, thought broadcasting, and magical thinking  Suicidal Thoughts: denies SI, intention, plan  Homicidal Thoughts: denies HI, intention, plan   Alertness/Orientation: alert and fully oriented   Insight: limited Judgment: fair  Memory: impaired   Executive Functions  Concentration: intact  Attention Span: fair  Recall: intact  Fund of Knowledge: fair    Review of Systems Review of Systems  Constitutional: Negative.   Respiratory: Negative.    Cardiovascular: Negative.   Gastrointestinal: Negative.   Genitourinary: Negative.   Psychiatric/Behavioral:         Psychiatric subjective data addressed in PSE or HPI / daily subjective report    Vital Signs: Blood pressure 105/70, pulse 92, temperature 97.8 F (36.6 C), temperature source Oral, resp. rate 18, height 6\' 3"  (1.905 m), weight 94.8 kg, SpO2 96%. Body mass index is 26.12 kg/m. Physical Exam Vitals and nursing note reviewed.  HENT:     Head: Normocephalic and atraumatic.  Pulmonary:     Effort: Pulmonary effort is normal.  Musculoskeletal:     Cervical back: Normal range of motion.  Neurological:     General: No focal deficit present.     Mental Status: He is alert.    Assets  Assets: Resilience   Treatment Plan Summary: Daily contact with patient to assess and evaluate symptoms and progress in treatment and Medication  management  Diagnoses / Active Problems: Schizoaffective disorder, depressive type (HCC) Principal Problem:   Schizoaffective disorder, depressive type (HCC)   ASSESSMENT: Brady Hoffman is a 57 year old Caucasian male with prior psychiatric history significant for schizoaffective disorder, seizure disorder, developmental delay, alcohol use disorder, who presents voluntarily [facility] from [facility] health ED at [facility] for evaluation for his mental health illness and frequent incidence of falls. After medical evaluation/stabilization & clearance, he was transferred to the [facility] for further psychiatric evaluation & treatments  Patient developed worsening symptoms of OCD 2 days ago and developed cholinergic symptoms suspected to be secondary to administration of haloperidol as part of agitation protocol superimposed on rapid decrease in clozapine dose. Yesterday, patient appeared much improved with no cholinergic symptoms noted and no cogwheeling or rigidity noted.  PLAN: Safety and Monitoring:  -- Voluntary admission to inpatient psychiatric unit for safety, stabilization and treatment  -- Daily contact with patient to assess and evaluate symptoms and progress in treatment  -- Patient's case to be discussed in multi-disciplinary team meeting  -- Observation Level : q15 minute checks  -- Vital signs:  q12 hours  -- Precautions: suicide, elopement, and assault  2. Interventions (medications, psychoeducation, etc):  -- decrease clozapine to 200 mg daily in the morning and 250 mg daily at bedtime (was previously 275 mg at bedtime) --repeat clozapine level pending -- decrease benztropine to 1 mg twice  daily for cholinergic symptoms -- continue fluoxetine 60 mg daily for depressive symptoms, anxiety -- continue lorazepam 0.5 mg every 8 hours for recent catatonic episode earlier in the hospitalization -- removed haloperidol from agitation protocol due to worsening cholinergic symptoms  -- medical  management: atropine at bedtime for drooling, aspirin, atorvastatin, docusate sodium, levetiracetam, levothyroxine, and pantoprazole  -- Patient does not need nicotine replacement  PRN medications for symptomatic management:              -- lorazepam 0.5 mg every 8 hours as needed for anxiety              -- continue acetaminophen 650 mg every 6 hours as needed for mild to moderate pain, fever, and headaches              -- continue bismuth subsalicylate 524 mg oral chewable tablet every 3 hours as needed for diarrhea / loose stools              -- continue senna 8.6 mg oral at bedtime and polyethylene glycol 17 g oral daily as needed for mild to moderate constipation              -- continue ondansetron 8 mg every 8 hours as needed for nausea or vomiting              -- continue aluminum-magnesium hydroxide + simethicone 30 mL every 4 hours as needed for heartburn or indigestion  -- As needed agitation protocol in-place  The risks/benefits/side-effects/alternatives to the above medication were discussed in detail with the patient and time was given for questions. The patient consents to medication trial. FDA black box warnings, if present, were discussed.  The patient is agreeable with the medication plan, as above. We will monitor the patient's response to pharmacologic treatment, and adjust medications as necessary.  3. Routine and other pertinent labs:             -- Metabolic profile:  BMI: Body mass index is 26.12 kg/m.  Prolactin: No results found for: "PROLACTIN"  Lipid Panel: Lab Results  Component Value Date   CHOL 144 07/03/2023   TRIG 152 (H) 07/03/2023   HDL 37 (L) 07/03/2023   CHOLHDL 3.9 07/03/2023   VLDL 28 10/20/2009   LDLCALC 82 07/03/2023   LDLCALC 59 10/02/2021    HbgA1c: Hgb A1c MFr Bld (%)  Date Value  09/23/2023 5.7 (H)    TSH: TSH  Date Value  09/24/2023 2.765 uIU/mL  07/03/2023 4.03 mIU/L   Clozapine on 10/19 is 1723 EKG monitoring: QTc: 474  on 09/26/2023  4. Group Therapy:  -- Encouraged patient to participate in unit milieu and in scheduled group therapies   -- Short Term Goals: Ability to identify changes in lifestyle to reduce recurrence of condition, verbalize feelings, identify and develop effective coping behaviors, maintain clinical measurements within normal limits, and identify triggers associated with substance abuse/mental health issues will improve. Improvement in ability to demonstrate self-control and comply with prescribed medications.  -- Long Term Goals: Improvement in symptoms so as ready for discharge -- Patient is encouraged to participate in group therapy while admitted to the psychiatric unit. -- We will address other chronic and acute stressors, which contributed to the patient's Schizoaffective disorder, depressive type (HCC) in order to reduce the risk of self-harm at discharge.  5. Discharge Planning:   -- Social work and case management to assist with discharge planning and identification of hospital follow-up needs prior to  discharge  -- Estimated LOS: 4 days  -- Discharge Concerns: Need to establish a safety plan; Medication compliance and effectiveness  -- Discharge Goals: Return home with outpatient referrals for mental health follow-up including medication management/psychotherapy  I certify that inpatient services furnished can reasonably be expected to improve the patient's condition.   Signed: Lance Muss, MD 09/29/2023, 9:49 AM

## 2023-09-29 NOTE — Group Note (Signed)
Date:  09/29/2023 Time:  9:25 AM  Group Topic/Focus:  Goals Group:   The focus of this group is to help patients establish daily goals to achieve during treatment and discuss how the patient can incorporate goal setting into their daily lives to aide in recovery.    Participation Level:  Did Not Attend   Brady Hoffman 09/29/2023, 9:25 AM

## 2023-09-29 NOTE — Progress Notes (Signed)
1:1 Note: Patient in his room and in bed. No distress noted. Safety checks continue. Patient remains safe at this time.

## 2023-09-29 NOTE — Consult Note (Signed)
WOC Nurse Consult Note: Reason for Consult: Consult requested for bilat feet.  Pt has black eschar to both great toe nail beds since the nail beds have died.  There are currently no open wounds or drainage, but the nails are beginning to lift away from the skin and will eventually fall off and evolve into full thickness wounds.  Topical treatment will not be effective to promote healing but is directed towards providing antimicrobial benefits.  Topical treatment orders provided for bedside nurses to perform as follows: Apply Bactroban cream to bilat great toenail areas Q day, then cover with bandaids.  Pt may shower with bandaids off, then dry the toes and apply cream and bandaids each time. Bilat toenails will eventually fall off since they are dead.  Please re-consult if further assistance is needed.  Thank-you,  Cammie Mcgee MSN, RN, CWOCN, [place], CNS [phone]

## 2023-09-29 NOTE — Progress Notes (Signed)
1:1 Note: Patient in his bed resting comfortably. Patient ambulating halls during med pass. Denies any concerns. 1:1 monitoring continues. Patient remains safe at this time.

## 2023-09-29 NOTE — Progress Notes (Signed)
Nursing 1:1 Note:  Patient resting in his bed. Denies SI/HI/AVH. Endorsing anxiety. Patient walked the halls for medications. Will continue to encourage ambulation. 1:1 monitoring continues. Patient remains safe at this time.

## 2023-09-30 DIAGNOSIS — F251 Schizoaffective disorder, depressive type: Secondary | ICD-10-CM | POA: Diagnosis not present

## 2023-09-30 MED ORDER — LORAZEPAM 0.5 MG PO TABS
0.5000 mg | ORAL_TABLET | Freq: Two times a day (BID) | ORAL | Status: AC
  Administered 2023-09-30 – 2023-10-01 (×3): 0.5 mg via ORAL
  Filled 2023-09-30 (×3): qty 1

## 2023-09-30 NOTE — Progress Notes (Addendum)
[facility] MD Progress Note  09/30/2023 9:36 AM Brady Hoffman  MRN:  [id number]  Principal Problem: Schizoaffective disorder, depressive type (HCC) Diagnosis: Principal Problem:   Schizoaffective disorder, depressive type (HCC)  Reason for Admission:  Brady Hoffman is a 57 year old Caucasian male with prior psychiatric history significant for schizoaffective disorder, seizure disorder, developmental delay, alcohol use disorder, who presents voluntarily [facility] from [facility] health ED at [facility] for evaluation for his mental health illness and frequent incidence of falls. After medical evaluation/stabilization & clearance, he was transferred to the [facility] for further psychiatric evaluation & treatments (admitted on 09/21/2023, total  LOS: 9 days )  Chart Review from last 24 hours:  The patient's chart was reviewed and nursing notes were reviewed. Vitals signs: stable. The patient's case was discussed in multidisciplinary team meeting. Per MAR, patient was taking medications appropriately other than refusing atropine drops. The following as needed medications were given: none. Per nursing, patient is cooperative and anxious and attended no group sessions.    Information Obtained Today During Patient Interview: The patient was seen eating breakfast, no acute distress. On assessment, the patient feels "okay" today.  When asked about speaking with family or friends, he reports speaking to his mother and he feels that his mother feels that he is doing better. When asked how he feels he is doing, he states better and says he is able to get out more. Patient reports having good sleep and good appetite. Patient reports some dizziness but notes that that has improved compared to when he first came to the hospital. When asked about germs, patient reports it is still persistent. He reports this morning going to the bathroom and taking some times in there. He reports the distressing thoughts about germs is  slightly getting better. The nurse tech noted him being in the bathroom for about 15 minutes, repeatedly sitting and standing on the toilet.   Denies SI, HI, AVH.   Past Psychiatric History:  Previous Psych Diagnoses: Panic disorder with agoraphobia, history of seizures, history of alcohol use disorder, schizoaffective disorder Prior inpatient treatment: Was admitted to [facility] 5 years ago Current/prior outpatient treatment: Yes with Dr. Jannifer Franklin Prior rehab hx: Denies Psychotherapy hx: Yes History of suicide: Denies History of homicide or aggression: Denies Psychiatric medication history: Patient has been on trial Seroquel, Prolixin, Effexor, and Haldol Psychiatric medication compliance history: Compliance Neuromodulation history: Denies Current Psychiatrist: Dr. Jannifer Franklin Current therapist: Dr. 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    Pervasive developmental disorder    Schizoaffective disorder    Seizures (HCC)    Family History: Medical: Patient unsure Psych: Patient unsure Psych Rx: Patient unsure SA/HA: Patient unsure Substance use family hx: Patient unsure  Social History:  Childhood (bring, raised, lives now, parents, siblings, schooling, education): Some college Abuse: Denies Marital Status: Denies Sexual orientation: Male from birth Children: No children Employment: Unemployed Peer Group: Denies peer group Housing: Lives at the group home Finances: Some financial difficulty Legal: Denies 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 [employer]  Current Medications: Current Facility-Administered Medications  Medication Dose Route Frequency Provider Last Rate Last Admin   acetaminophen (TYLENOL) tablet 650 mg  650 mg Oral Q6H PRN Earney Navy, NP  alum & mag  hydroxide-simeth (MAALOX/MYLANTA) 200-200-20 MG/5ML suspension 30 mL  30 mL Oral Q4H PRN Dahlia Byes C, NP       aspirin chewable tablet 81 mg  81 mg Oral Daily Onuoha, Josephine C, NP   81 mg at 09/30/23 0816   atorvastatin (LIPITOR) tablet 20 mg  20 mg Oral QHS Onuoha, Josephine C, NP   20 mg at 09/29/23 2047   atropine 1 % ophthalmic solution 1 drop  1 drop Sublingual QHS Kizzie Ide B, MD   1 drop at 09/28/23 2133   benztropine (COGENTIN) tablet 1 mg  1 mg Oral BID Kizzie Ide B, MD   1 mg at 09/30/23 1610   bismuth subsalicylate (PEPTO BISMOL) chewable tablet 524 mg  524 mg Oral Q3H PRN Augusto Gamble, MD       cloZAPine (CLOZARIL) tablet 200 mg  200 mg Oral Daily Rex Kras, MD   200 mg at 09/30/23 0819   cloZAPine (CLOZARIL) tablet 250 mg  250 mg Oral QHS Kizzie Ide B, MD   250 mg at 09/29/23 2047   diphenhydrAMINE (BENADRYL) capsule 50 mg  50 mg Oral TID PRN Earney Navy, NP   50 mg at 09/27/23 1024   Or   diphenhydrAMINE (BENADRYL) injection 50 mg  50 mg Intramuscular TID PRN Earney Navy, NP       docusate sodium (COLACE) capsule 100 mg  100 mg Oral Daily Onuoha, Josephine C, NP   100 mg at 09/30/23 0818   FLUoxetine (PROZAC) capsule 60 mg  60 mg Oral Daily Massengill, Harrold Donath, MD   60 mg at 09/30/23 0816   influenza vac split trivalent PF (FLULAVAL) injection 0.5 mL  0.5 mL Intramuscular Tomorrow-1000 Massengill, Harrold Donath, MD       levETIRAcetam (KEPPRA) tablet 500 mg  500 mg Oral BID Dahlia Byes C, NP   500 mg at 09/30/23 0818   levothyroxine (SYNTHROID) tablet 50 mcg  50 mcg Oral Q0600 Dahlia Byes C, NP   50 mcg at 09/30/23 0700   LORazepam (ATIVAN) tablet 2 mg  2 mg Oral TID PRN Dahlia Byes C, NP   2 mg at 09/27/23 1023   Or   LORazepam (ATIVAN) injection 2 mg  2 mg Intramuscular TID PRN Dahlia Byes C, NP       LORazepam (ATIVAN) tablet 0.5 mg  0.5 mg Oral Q8H PRN Dahlia Byes C, NP   0.5 mg at 09/21/23 2131   LORazepam  (ATIVAN) tablet 0.5 mg  0.5 mg Oral Q8H Massengill, Nathan, MD   0.5 mg at 09/30/23 0700   mupirocin cream (BACTROBAN) 2 %   Topical Daily Massengill, Harrold Donath, MD   Given at 09/30/23 0819   ondansetron (ZOFRAN) tablet 8 mg  8 mg Oral Q8H PRN Augusto Gamble, MD       pantoprazole (PROTONIX) EC tablet 80 mg  80 mg Oral Daily Onuoha, Josephine C, NP   80 mg at 09/30/23 0818   polyethylene glycol (MIRALAX / GLYCOLAX) packet 17 g  17 g Oral Daily PRN Augusto Gamble, MD       senna Mancel Parsons) tablet 8.6 mg  1 tablet Oral QHS PRN Augusto Gamble, MD        Lab Results:  Results for orders placed or performed during the hospital encounter of 09/21/23 (from the past 48 hour(s))  CBC with Differential/Platelet     Status: None   Collection Time: 09/28/23  6:32 PM  Result Value Ref Range  WBC 5.7 4.0 - 10.5 K/uL   RBC 4.28 4.22 - 5.81 MIL/uL   Hemoglobin 13.4 13.0 - 17.0 g/dL   HCT 40.9 81.1 - 91.4 %   MCV 94.2 80.0 - 100.0 fL   MCH 31.3 26.0 - 34.0 pg   MCHC 33.3 30.0 - 36.0 g/dL   RDW 78.2 95.6 - 21.3 %   Platelets 192 150 - 400 K/uL   nRBC 0.0 0.0 - 0.2 %   Neutrophils Relative % 64 %   Neutro Abs 3.6 1.7 - 7.7 K/uL   Lymphocytes Relative 24 %   Lymphs Abs 1.4 0.7 - 4.0 K/uL   Monocytes Relative 9 %   Monocytes Absolute 0.5 0.1 - 1.0 K/uL   Eosinophils Relative 2 %   Eosinophils Absolute 0.1 0.0 - 0.5 K/uL   Basophils Relative 1 %   Basophils Absolute 0.0 0.0 - 0.1 K/uL   Immature Granulocytes 0 %   Abs Immature Granulocytes 0.01 0.00 - 0.07 K/uL    Comment: Performed at [facility], [address], [place], [place] [address]  CK     Status: None   Collection Time: 09/28/23  6:32 PM  Result Value Ref Range   Total CK 175 49 - 397 U/L    Comment: Performed at [facility], [address] [place], [place] [address]  Comprehensive metabolic panel     Status: Abnormal   Collection Time: 09/28/23  6:32 PM  Result Value Ref Range   Sodium 139 135 - 145  mmol/L   Potassium 4.0 3.5 - 5.1 mmol/L   Chloride 103 98 - 111 mmol/L   CO2 26 22 - 32 mmol/L   Glucose, Bld 115 (H) 70 - 99 mg/dL    Comment: Glucose reference range applies only to samples taken after fasting for at least 8 hours.   BUN 16 6 - 20 mg/dL   Creatinine, Ser 2.95 0.61 - 1.24 mg/dL   Calcium 8.8 (L) 8.9 - 10.3 mg/dL   Total Protein 7.0 6.5 - 8.1 g/dL   Albumin 3.9 3.5 - 5.0 g/dL   AST 20 15 - 41 U/L   ALT 21 0 - 44 U/L   Alkaline Phosphatase 133 (H) 38 - 126 U/L   Total Bilirubin 0.4 0.3 - 1.2 mg/dL   GFR, Estimated >28 >41 mL/min    Comment: (NOTE) Calculated using the CKD-EPI Creatinine Equation (2021)    Anion gap 10 5 - 15    Comment: Performed at [facility], [address] [place], [place] [address]    Blood Alcohol level:  Lab Results  Component Value Date   [facility] <10 09/19/2023   ETH <10 09/28/2019    Metabolic Labs: Lab Results  Component Value Date   HGBA1C 5.7 (H) 09/23/2023   MPG 116.89 09/23/2023   MPG 114 02/25/2014   No results found for: "PROLACTIN" Lab Results  Component Value Date   CHOL 144 07/03/2023   TRIG 152 (H) 07/03/2023   HDL 37 (L) 07/03/2023   CHOLHDL 3.9 07/03/2023   VLDL 28 10/20/2009   LDLCALC 82 07/03/2023   LDLCALC 59 10/02/2021    Physical Findings: AIMS: AIMS: Facial and Oral Movements Muscles of Facial Expression: None Lips and Perioral Area: None Jaw: None Tongue: None,Extremity Movements Upper (arms, wrists, hands, fingers): None Lower (legs, knees, ankles, toes): None, Trunk Movements Neck, shoulders, hips: None, Global Judgements Severity of abnormal movements overall : None Incapacitation due to abnormal movements:  None Patient's awareness of abnormal movements: No Awareness, Dental Status Current problems with teeth and/or dentures?: No Does patient usually wear dentures?: No Edentia?: No  Total score: 0  Tremors: present when eating  CIWA:    COWS:     Psychiatric  Specialty Exam:  General Appearance: appears at stated age, casually dressed and groomed   Behavior: pleasant and cooperative   Psychomotor Activity: some tremor when eating  Eye Contact: fair  Speech: normal amount, tone, volume and fluency    Mood: euthymic  Affect: congruent, pleasant   Thought Process: linear, concrete responses, no circumstantial or tangential thought process noted, no racing thoughts or flight of ideas  Descriptions of Associations: intact   Thought Content Hallucinations: denies AH, VH , does not appear responding to stimuli  Delusions: no paranoia, delusions of control, grandeur, ideas of reference, thought broadcasting, and magical thinking  Suicidal Thoughts: denies SI, intention, plan  Homicidal Thoughts: denies HI, intention, plan   Alertness/Orientation: alert and fully oriented   Insight: limited Judgment: limited  Memory: limited   Executive Functions  Concentration: intact  Attention Span: fair  Recall: intact  Fund of Knowledge: fair    Review of Systems Review of Systems  Constitutional: Negative.   Respiratory: Negative.    Cardiovascular: Negative.   Gastrointestinal: Negative.   Genitourinary: Negative.   Psychiatric/Behavioral:         Psychiatric subjective data addressed in PSE or HPI / daily subjective report    Vital Signs: Blood pressure 121/84, pulse 98, temperature 97.8 F (36.6 C), temperature source Oral, resp. rate 18, height 6\' 3"  (1.905 m), weight 94.8 kg, SpO2 96%. Body mass index is 26.12 kg/m. Physical Exam Vitals and nursing note reviewed.  HENT:     Head: Normocephalic and atraumatic.  Pulmonary:     Effort: Pulmonary effort is normal.  Musculoskeletal:     Cervical back: Normal range of motion.  Neurological:     General: No focal deficit present.     Mental Status: He is alert.    Assets  Assets: Resilience   Treatment Plan Summary: Daily contact with patient to assess and evaluate  symptoms and progress in treatment and Medication management  Diagnoses / Active Problems: Schizoaffective disorder, depressive type (HCC) Principal Problem:   Schizoaffective disorder, depressive type (HCC)   ASSESSMENT: Brady Hoffman is a 57 year old Caucasian male with prior psychiatric history significant for schizoaffective disorder, seizure disorder, developmental delay, alcohol use disorder, who presents voluntarily [facility] from [facility] health ED at [facility] for evaluation for his mental health illness and frequent incidence of falls. After medical evaluation/stabilization & clearance, he was transferred to the [facility] for further psychiatric evaluation & treatments   PLAN: Safety and Monitoring:  -- Voluntary admission to inpatient psychiatric unit for safety, stabilization and treatment  -- Daily contact with patient to assess and evaluate symptoms and progress in treatment  -- Patient's case to be discussed in multi-disciplinary team meeting  -- Observation Level : q15 minute checks  -- Vital signs:  q12 hours  -- Precautions: suicide, elopement, and assault  2. Interventions (medications, psychoeducation, etc):  -- continue clozapine 200 mg daily in the morning and 250 mg daily at bedtime (was previously 275 mg at bedtime) --repeat clozapine level pending -- continue benztropine 1 mg twice daily for cholinergic symptoms -- continue fluoxetine 60 mg daily for depressive symptoms, anxiety -- decrease lorazepam to 0.5 mg BID for recent catatonic episode earlier in the hospitalization (10/22)  --  removed haloperidol from agitation protocol due to worsening cholinergic symptoms -- medical management: atropine at bedtime for drooling, aspirin, atorvastatin, docusate sodium, levetiracetam, levothyroxine, and pantoprazole -- Patient does not need nicotine replacement  PRN medications for symptomatic management:              -- lorazepam 0.5 mg every 8 hours as needed for anxiety               -- continue acetaminophen 650 mg every 6 hours as needed for mild to moderate pain, fever, and headaches              -- continue bismuth subsalicylate 524 mg oral chewable tablet every 3 hours as needed for diarrhea / loose stools              -- continue senna 8.6 mg oral at bedtime and polyethylene glycol 17 g oral daily as needed for mild to moderate constipation              -- continue ondansetron 8 mg every 8 hours as needed for nausea or vomiting              -- continue aluminum-magnesium hydroxide + simethicone 30 mL every 4 hours as needed for heartburn or indigestion  -- As needed agitation protocol in-place  The risks/benefits/side-effects/alternatives to the above medication were discussed in detail with the patient and time was given for questions. The patient consents to medication trial. FDA black box warnings, if present, were discussed.  The patient is agreeable with the medication plan, as above. We will monitor the patient's response to pharmacologic treatment, and adjust medications as necessary.  3. Routine and other pertinent labs:             -- Metabolic profile:  BMI: Body mass index is 26.12 kg/m.  Prolactin: No results found for: "PROLACTIN"  Lipid Panel: Lab Results  Component Value Date   CHOL 144 07/03/2023   TRIG 152 (H) 07/03/2023   HDL 37 (L) 07/03/2023   CHOLHDL 3.9 07/03/2023   VLDL 28 10/20/2009   LDLCALC 82 07/03/2023   LDLCALC 59 10/02/2021    HbgA1c: Hgb A1c MFr Bld (%)  Date Value  09/23/2023 5.7 (H)    TSH: TSH  Date Value  09/24/2023 2.765 uIU/mL  07/03/2023 4.03 mIU/L   Clozapine on 10/19 is 1723 EKG monitoring: QTc: 474 on 09/26/2023  4. Group Therapy:  -- Encouraged patient to participate in unit milieu and in scheduled group therapies   -- Short Term Goals: Ability to identify changes in lifestyle to reduce recurrence of condition, verbalize feelings, identify and develop effective coping behaviors, maintain  clinical measurements within normal limits, and identify triggers associated with substance abuse/mental health issues will improve. Improvement in ability to demonstrate self-control and comply with prescribed medications.  -- Long Term Goals: Improvement in symptoms so as ready for discharge -- Patient is encouraged to participate in group therapy while admitted to the psychiatric unit. -- We will address other chronic and acute stressors, which contributed to the patient's Schizoaffective disorder, depressive type (HCC) in order to reduce the risk of self-harm at discharge.  5. Discharge Planning:   -- Social work and case management to assist with discharge planning and identification of hospital follow-up needs prior to discharge  -- Estimated LOS: 4 days  -- Discharge Concerns: Need to establish a safety plan; Medication compliance and effectiveness  -- Discharge Goals: Return home with outpatient referrals for mental health  follow-up including medication management/psychotherapy  I certify that inpatient services furnished can reasonably be expected to improve the patient's condition.   Signed: Lance Muss, MD 09/30/2023, 9:36 AM

## 2023-09-30 NOTE — Plan of Care (Signed)
  Problem: Education: Goal: Emotional status will improve Outcome: Progressing   Problem: Activity: Goal: Sleeping patterns will improve Outcome: Progressing   Problem: Safety: Goal: Periods of time without injury will increase Outcome: Progressing   Problem: Activity: Goal: Interest or engagement in activities will improve Outcome: Not Progressing

## 2023-09-30 NOTE — Progress Notes (Signed)
Nursing 1:1 note D:Pt observed sleeping in bed with eyes closed. RR even and unlabored. No distress noted. A: 1:1 observation continues for safety  R: pt remains safe  

## 2023-09-30 NOTE — Group Note (Signed)
Date:  09/30/2023 Time:  11:43 PM  Group Topic/Focus:  Wrap-Up Group:   The focus of this group is to help patients review their daily goal of treatment and discuss progress on daily workbooks.      Additional Comments:  Pt was encouraged, but opted out of attended wrap up group.   Chrisandra Netters 09/30/2023, 11:43 PM

## 2023-09-30 NOTE — Group Note (Signed)
Date:  09/30/2023 Time:  9:21 AM  Group Topic/Focus:  Goals Group:   The focus of this group is to help patients establish daily goals to achieve during treatment and discuss how the patient can incorporate goal setting into their daily lives to aide in recovery.    Participation Level:  Did Not Attend   Donell Beers 09/30/2023, 9:21 AM

## 2023-09-30 NOTE — Progress Notes (Signed)
Patient ambulated with walker to the medication window this evening, took his medication with no side effect noted. Patient denies any pain or discomfort. Patient encouraged to attend group sections, which he did. No distress noted at this time. Staff will continue to provide support to patient.

## 2023-09-30 NOTE — Progress Notes (Signed)
   09/30/23 1945  Psych Admission Type (Psych Patients Only)  Admission Status Voluntary  Psychosocial Assessment  Patient Complaints Anxiety;Worrying  Eye Contact Fair  Facial Expression Sad;Flat  Affect Anxious  Speech Logical/coherent  Interaction Minimal  Motor Activity Tremors  Appearance/Hygiene Disheveled  Behavior Characteristics Cooperative  Mood Anxious  Aggressive Behavior  Effect No apparent injury  Thought Process  Coherency WDL  Content WDL  Delusions WDL  Perception WDL  Hallucination None reported or observed  Judgment Impaired  Confusion WDL  Danger to Self  Current suicidal ideation? Denies

## 2023-09-30 NOTE — Progress Notes (Signed)
Patient was at the window for his am medication. Patient tolerated medication well with no side effect noted. Patient denies any discomfort at this time, no distress noted . Staff will continue to provide support to patient.

## 2023-09-30 NOTE — Group Note (Signed)
Recreation Therapy Group Note   Group Topic:Team Building  Group Date: 09/30/2023 Start Time: 1010 End Time: 1035 Facilitators: Tanaiya Kolarik-McCall, LRT,CTRS Location: [address] Dayroom   Group Topic: Communication, Team Building, Problem Solving  Goal Area(s) Addresses:  Patient will effectively work with peer towards shared goal.  Patient will identify skills used to make activity successful.  Patient will identify how skills used during activity can be used to reach post d/c goals.   Behavioral Response: Engaged, Attentive, Appropriate   Intervention: Teambuilding Activity  Group Description: [place]. Working in teams, patients were asked to use colored discs to get the entire team from one end of the hall way to the other. Patients were only allowed to move down and back the hallway by stepping on the discs, patient teams were provided 1 additional disc to assist with them completing task.    Education: Pharmacist, community, Scientist, physiological, Discharge Planning   Education Outcome: Acknowledges education.    Affect/Mood: N/A   Participation Level: Did not attend    Clinical Observations/Individualized Feedback:    Plan: Continue to engage patient in RT group sessions 2-3x/week.   Anvay Tennis-McCall, LRT,CTRS 09/30/2023 12:10 PM

## 2023-09-30 NOTE — Plan of Care (Signed)

## 2023-10-01 ENCOUNTER — Inpatient Hospital Stay (HOSPITAL_COMMUNITY)
Admit: 2023-10-01 | Discharge: 2023-10-01 | Disposition: A | Discharge status: Home / Self Care | Payer: Medicare Other | Attending: Psychiatry | Admitting: Psychiatry

## 2023-10-01 DIAGNOSIS — F251 Schizoaffective disorder, depressive type: Secondary | ICD-10-CM | POA: Diagnosis not present

## 2023-10-01 LAB — CLOZAPINE (CLOZARIL)
Clozapine Lvl: 701 ng/mL — ABNORMAL HIGH (ref 350–600)
NorClozapine: 283 ng/mL
Total(Cloz+Norcloz): 984 ng/mL

## 2023-10-01 MED ORDER — DIPHENHYDRAMINE HCL 25 MG PO CAPS
50.0000 mg | ORAL_CAPSULE | Freq: Every day | ORAL | Status: DC
  Administered 2023-10-01 – 2023-10-15 (×15): 50 mg via ORAL
  Filled 2023-10-01 (×18): qty 2

## 2023-10-01 NOTE — Progress Notes (Signed)
Pt awake in dayroom with assigned 1:1 staff on initial encounter. Presents with flat affect, avertive eye contact, slow but steady gait and is concrete on interactions. Denies SI, HI and AVH. Reports left ribcage pain when breathing 8/10 "I fell about 2 weeks ago". Received PRN Tylenol 650 mg PO at 0837 with relief when reassessed at 0935. Assigned provider notified of pt's complaint; new order received for X-ray this shift. Pt tolerated fluids, breakfast tray and medications well when offered.  Emotional support, encouragement and reassurance offered to pt this shift.  1:1 observation maintained for safety with assigned MHT staff in attendance at all times.

## 2023-10-01 NOTE — Progress Notes (Addendum)
[facility] MD Progress Note  10/01/2023 9:14 AM Brady Hoffman  MRN:  [id number]  Principal Problem: Schizoaffective disorder, depressive type (HCC) Diagnosis: Principal Problem:   Schizoaffective disorder, depressive type (HCC)  Reason for Admission:  Brady Hoffman is a 57 year old Caucasian male with prior psychiatric history significant for schizoaffective disorder, seizure disorder, developmental delay, alcohol use disorder, who presents voluntarily [facility] from [facility] health ED at [facility] for evaluation for his mental health illness and frequent incidence of falls. After medical evaluation/stabilization & clearance, he was transferred to the [facility] for further psychiatric evaluation & treatments (admitted on 09/21/2023, total  LOS: 10 days )  Chart Review from last 24 hours:  The patient's chart was reviewed and nursing notes were reviewed. Vitals signs: pulse 109. The patient's case was discussed in multidisciplinary team meeting. Per [facility], patient was taking medications appropriately. The following as needed medications were given: tylenol 1x. Per nursing, patient is anxious and cooperative and attended no group sessions.     Information Obtained Today During Patient Interview: The patient was seen in the bathroom, no acute distress. On assessment, the patient feels "okay" today. Patient reports not going to group session because he has difficulty going there. I encouraged the patient to attend group sessions. When asked about speaking with family or friends, he reports talking to his mother. Patient reports having fair sleep and fair appetite. Patient feels that the medications have been helpful with his depression and anxiety, initially rating it a 9/10 and now the severity is a 5/10 and reports adverse effects such as dry mouth. He also denies AVH which he states he was initially experiencing. I discussed discontinuing the atropine drops.   Denies SI and HI.   Past Psychiatric History:   Previous Psych Diagnoses: Panic disorder with agoraphobia, history of seizures, history of alcohol use disorder, schizoaffective disorder Prior inpatient treatment: Was admitted to [facility] 5 years ago Current/prior outpatient treatment: Yes with Dr. Jannifer Franklin Prior rehab hx: Denies Psychotherapy hx: Yes History of suicide: Denies History of homicide or aggression: Denies Psychiatric medication history: Patient has been on trial Seroquel, Prolixin, Effexor, and Haldol Psychiatric medication compliance history: Compliance Neuromodulation history: Denies Current Psychiatrist: Dr. Jannifer Franklin Current therapist: Dr. 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    Pervasive developmental disorder    Schizoaffective disorder    Seizures (HCC)    Family History: Medical: Patient unsure Psych: Patient unsure Psych Rx: Patient unsure SA/HA: Patient unsure Substance use family hx: Patient unsure  Social History:  Childhood (bring, raised, lives now, parents, siblings, schooling, education): Some college Abuse: Denies Marital Status: Denies Sexual orientation: Male from birth Children: No children Employment: Unemployed Peer Group: Denies peer group Housing: Lives at the group home Finances: Some financial difficulty Legal: Denies 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 [employer]  Current Medications: Current Facility-Administered Medications  Medication Dose Route Frequency Provider Last Rate Last Admin   acetaminophen (TYLENOL) tablet 650 mg  650 mg Oral Q6H PRN Dahlia Byes C, NP   650 mg at 10/01/23 0837   alum & mag hydroxide-simeth (MAALOX/MYLANTA) 200-200-20 MG/5ML suspension 30 mL  30 mL Oral Q4H PRN Dahlia Byes C, NP       aspirin chewable tablet 81 mg  81 mg  Oral Daily Dahlia Byes C, NP   81 mg at 10/01/23 6213   atorvastatin (LIPITOR) tablet 20 mg  20 mg Oral QHS Onuoha, Josephine C, NP   20 mg at 09/30/23 2048   atropine 1 % ophthalmic solution 1 drop  1 drop Sublingual QHS Kizzie Ide B, MD   1 drop at 09/28/23 2133   benztropine (COGENTIN) tablet 1 mg  1 mg Oral BID Kizzie Ide B, MD   1 mg at 10/01/23 0865   bismuth subsalicylate (PEPTO BISMOL) chewable tablet 524 mg  524 mg Oral Q3H PRN Augusto Gamble, MD       cloZAPine (CLOZARIL) tablet 200 mg  200 mg Oral Daily Rex Kras, MD   200 mg at 10/01/23 0835   cloZAPine (CLOZARIL) tablet 250 mg  250 mg Oral QHS Kizzie Ide B, MD   250 mg at 09/30/23 2047   diphenhydrAMINE (BENADRYL) capsule 50 mg  50 mg Oral TID PRN Earney Navy, NP   50 mg at 09/27/23 1024   Or   diphenhydrAMINE (BENADRYL) injection 50 mg  50 mg Intramuscular TID PRN Earney Navy, NP       docusate sodium (COLACE) capsule 100 mg  100 mg Oral Daily Welford Roche, Josephine C, NP   100 mg at 10/01/23 0835   FLUoxetine (PROZAC) capsule 60 mg  60 mg Oral Daily Massengill, Harrold Donath, MD   60 mg at 10/01/23 0835   influenza vac split trivalent PF (FLULAVAL) injection 0.5 mL  0.5 mL Intramuscular Tomorrow-1000 Massengill, Harrold Donath, MD       levETIRAcetam (KEPPRA) tablet 500 mg  500 mg Oral BID Dahlia Byes C, NP   500 mg at 10/01/23 0836   levothyroxine (SYNTHROID) tablet 50 mcg  50 mcg Oral Q0600 Dahlia Byes C, NP   50 mcg at 10/01/23 7846   LORazepam (ATIVAN) tablet 2 mg  2 mg Oral TID PRN Dahlia Byes C, NP   2 mg at 09/27/23 1023   Or   LORazepam (ATIVAN) injection 2 mg  2 mg Intramuscular TID PRN Dahlia Byes C, NP       LORazepam (ATIVAN) tablet 0.5 mg  0.5 mg Oral Q8H PRN Dahlia Byes C, NP   0.5 mg at 09/21/23 2131   LORazepam (ATIVAN) tablet 0.5 mg  0.5 mg Oral BID Kizzie Ide B, MD   0.5 mg at 10/01/23 9629   mupirocin cream (BACTROBAN) 2 %   Topical Daily Massengill, Harrold Donath, MD   1 Application at  10/01/23 0836   ondansetron (ZOFRAN) tablet 8 mg  8 mg Oral Q8H PRN Augusto Gamble, MD       pantoprazole (PROTONIX) EC tablet 80 mg  80 mg Oral Daily Onuoha, Josephine C, NP   80 mg at 10/01/23 0836   polyethylene glycol (MIRALAX / GLYCOLAX) packet 17 g  17 g Oral Daily PRN Augusto Gamble, MD       senna (SENOKOT) tablet 8.6 mg  1 tablet Oral QHS PRN Augusto Gamble, MD        Lab Results:  No results found for this or any previous visit (from the past 48 hour(s)).   Blood Alcohol level:  Lab Results  Component Value Date   [place] <10 09/19/2023   ETH <10 09/28/2019    Metabolic Labs: Lab Results  Component Value Date   HGBA1C 5.7 (H) 09/23/2023   MPG 116.89 09/23/2023   MPG 114 02/25/2014   No results found for: "PROLACTIN" Lab Results  Component  Value Date   CHOL 144 07/03/2023   TRIG 152 (H) 07/03/2023   HDL 37 (L) 07/03/2023   CHOLHDL 3.9 07/03/2023   VLDL 28 10/20/2009   LDLCALC 82 07/03/2023   LDLCALC 59 10/02/2021    Physical Findings: AIMS: AIMS: Facial and Oral Movements Muscles of Facial Expression: None Lips and Perioral Area: None Jaw: None Tongue: None,Extremity Movements Upper (arms, wrists, hands, fingers): None Lower (legs, knees, ankles, toes): None, Trunk Movements Neck, shoulders, hips: None, Global Judgements Severity of abnormal movements overall : None Incapacitation due to abnormal movements: None Patient's awareness of abnormal movements: No Awareness, Dental Status Current problems with teeth and/or dentures?: No Does patient usually wear dentures?: No Edentia?: No  Total score: 0  Tremors: present when eating  CIWA:    COWS:     Psychiatric Specialty Exam:  General Appearance: appears at stated age, casually dressed and groomed   Behavior: pleasant and cooperative   Psychomotor Activity: no psychomotor agitation or retardation noted   Eye Contact: fair  Speech: normal amount, tone, volume and fluency    Mood: euthymic  Affect:  congruent, pleasant and interactive   Thought Process: linear, goal directed, no circumstantial or tangential thought process noted, no racing thoughts or flight of ideas  Descriptions of Associations: intact   Thought Content Hallucinations: denies AH, VH , does not appear responding to stimuli  Delusions: no paranoia, delusions of control, grandeur, ideas of reference, thought broadcasting, and magical thinking  Suicidal Thoughts: denies SI, intention, plan  Homicidal Thoughts: denies HI, intention, plan   Alertness/Orientation: alert and fully oriented   Insight: fair Judgment: fair  Memory: intact   Executive Functions  Concentration: intact  Attention Span: fair  Recall: intact  Fund of Knowledge: fair      Vital Signs: Blood pressure 111/84, pulse (!) 109, temperature 98.2 F (36.8 C), temperature source Oral, resp. rate 16, height 6\' 3"  (1.905 m), weight 94.8 kg, SpO2 97%. Body mass index is 26.12 kg/m.  Physical Exam  General: Pleasant, well-appearing. No acute distress. Pulmonary: Normal effort. No wheezing or rales. Skin: No obvious rash or lesions. Neuro: A&Ox3.No focal deficit.   Review of Systems  Positive dry mouth    Assets  Assets: Resilience   Treatment Plan Summary: Daily contact with patient to assess and evaluate symptoms and progress in treatment and Medication management  Diagnoses / Active Problems: Schizoaffective disorder, depressive type (HCC) Principal Problem:   Schizoaffective disorder, depressive type (HCC)   ASSESSMENT: Brady Hoffman is a 57 year old Caucasian male with prior psychiatric history significant for schizoaffective disorder, seizure disorder, developmental delay, alcohol use disorder, who presents voluntarily [facility] from [facility] health ED at [facility] for evaluation for his mental health illness and frequent incidence of falls. After medical evaluation/stabilization & clearance, he was transferred to the [facility] for  further psychiatric evaluation & treatments   PLAN: Safety and Monitoring:  -- Voluntary admission to inpatient psychiatric unit for safety, stabilization and treatment  -- Daily contact with patient to assess and evaluate symptoms and progress in treatment  -- Patient's case to be discussed in multi-disciplinary team meeting  -- Observation Level : q15 minute checks  -- Vital signs:  q12 hours  -- Precautions: suicide, elopement, and assault  2. Interventions (medications, psychoeducation, etc):  -- continue clozapine 200 mg daily in the morning and 250 mg daily at bedtime (was previously 275 mg at bedtime) --repeat clozapine level pending -- continue fluoxetine 60 mg daily for depressive symptoms,  anxiety  -- d/c lorazepam 0.5 mg BID for recent catatonic episode earlier in the hospitalization (10/22) -- d/c benztropine 1 mg twice daily  -- removed haloperidol from agitation protocol due to worsening cholinergic symptoms -- medical management: d/c atropine at bedtime for droolingpt now having dry mouth, aspirin, atorvastatin, docusate sodium, levetiracetam, levothyroxine, and pantoprazole -- Patient does not need nicotine replacement  PRN medications for symptomatic management:              -- lorazepam 0.5 mg every 8 hours as needed for anxiety              -- continue acetaminophen 650 mg every 6 hours as needed for mild to moderate pain, fever, and headaches              -- continue bismuth subsalicylate 524 mg oral chewable tablet every 3 hours as needed for diarrhea / loose stools              -- continue senna 8.6 mg oral at bedtime and polyethylene glycol 17 g oral daily as needed for mild to moderate constipation              -- continue ondansetron 8 mg every 8 hours as needed for nausea or vomiting              -- continue aluminum-magnesium hydroxide + simethicone 30 mL every 4 hours as needed for heartburn or indigestion  -- As needed agitation protocol in-place  The  risks/benefits/side-effects/alternatives to the above medication were discussed in detail with the patient and time was given for questions. The patient consents to medication trial. FDA black box warnings, if present, were discussed.  The patient is agreeable with the medication plan, as above. We will monitor the patient's response to pharmacologic treatment, and adjust medications as necessary.  3. Routine and other pertinent labs:             -- Metabolic profile:  BMI: Body mass index is 26.12 kg/m.  Prolactin: No results found for: "PROLACTIN"  Lipid Panel: Lab Results  Component Value Date   CHOL 144 07/03/2023   TRIG 152 (H) 07/03/2023   HDL 37 (L) 07/03/2023   CHOLHDL 3.9 07/03/2023   VLDL 28 10/20/2009   LDLCALC 82 07/03/2023   LDLCALC 59 10/02/2021    HbgA1c: Hgb A1c MFr Bld (%)  Date Value  09/23/2023 5.7 (H)    TSH: TSH  Date Value  09/24/2023 2.765 uIU/mL  07/03/2023 4.03 mIU/L   Clozapine on 10/19 is 1723 EKG monitoring: QTc: 479 on 09/27/2023  4. Group Therapy:  -- Encouraged patient to participate in unit milieu and in scheduled group therapies   -- Short Term Goals: Ability to identify changes in lifestyle to reduce recurrence of condition, verbalize feelings, identify and develop effective coping behaviors, maintain clinical measurements within normal limits, and identify triggers associated with substance abuse/mental health issues will improve. Improvement in ability to demonstrate self-control and comply with prescribed medications.  -- Long Term Goals: Improvement in symptoms so as ready for discharge -- Patient is encouraged to participate in group therapy while admitted to the psychiatric unit. -- We will address other chronic and acute stressors, which contributed to the patient's Schizoaffective disorder, depressive type (HCC) in order to reduce the risk of self-harm at discharge.  5. Discharge Planning:   -- Social work and case management  to assist with discharge planning and identification of hospital follow-up needs prior to discharge  --  Estimated LOS: 2-3 days  -- Discharge Concerns: Need to establish a safety plan; Medication compliance and effectiveness  -- Discharge Goals: Return home with outpatient referrals for mental health follow-up including medication management/psychotherapy  I certify that inpatient services furnished can reasonably be expected to improve the patient's condition.   Signed: Lance Muss, MD 10/01/2023, 9:14 AM

## 2023-10-01 NOTE — Progress Notes (Signed)
Patient has been exhibiting compulsive behaviors since returning back from imaging. He has been diaphoretic, increased tremors and muscle tension, standing up and sitting down repeatedly, and reports increased anxiety. Patient administered PO 0.5mg  ativan without any changes, so then he was given IM ativan and benadryl. BP 143/86 pulse 111. Symptoms decreased post med administration. 1:1 monitoring continues. Patient remains safe at this time.

## 2023-10-01 NOTE — Group Note (Signed)
Date:  10/01/2023 Time:  9:05 PM  Group Topic/Focus:  Wrap-Up Group:   The focus of this group is to help patients review their daily goal of treatment and discuss progress on daily workbooks.    Additional Comments:   Pt was invited but opted of going to group.   Chrisandra Netters 10/01/2023, 9:05 PM

## 2023-10-01 NOTE — Progress Notes (Signed)
Nursing 1:1 note D:Pt observed sitting in bed with eyes open. RR even and unlabored. No distress noted. A: 1:1 observation continues for safety  R: pt remains safe  

## 2023-10-01 NOTE — Group Note (Signed)
Recreation Therapy Group Note   Group Topic:Leisure Education  Group Date: 10/01/2023 Start Time: 1037 End Time: 1108 Facilitators: Josuel Koeppen-McCall, LRT,CTRS Location: [address] Dayroom   Group Topic: Leisure Education  Goal Area(s) Addresses:  Patient will identify positive leisure activities for use post discharge. Patient will identify at least one positive benefit of participation in leisure activities.   Intervention: Innovation, Group Presentation   Group Description: Keep It Contractor. Patients were placed in circle and given a beach ball. Patients were to hit the beach ball back and forth for as long as possible without it coming to a stop. While patients were hitting the ball, LRT kept timer of how long the ball was moving.  If the call came to a complete stop, the time would start over.  Education: Leisure Scientist, physiological, Special educational needs teacher, Teamwork, Discharge Planning  Education Outcome: Acknowledges education/In group clarification offered/Needs additional education.    Affect/Mood: N/A   Participation Level: Did not attend    Clinical Observations/Individualized Feedback:     Plan: Continue to engage patient in RT group sessions 2-3x/week.   Najat Olazabal-McCall, LRT,CTRS 10/01/2023 12:30 PM

## 2023-10-01 NOTE — Progress Notes (Signed)
Pt noted with increased restlessness, involuntary movements, unable to sit still on bed or toilet with stiffness in lower extremities. Observed being obsessive with the toilet, putting multiple tissues in the toilet, attempting to wipe his buttocks even though he was not physically having a BM. PRN Ativan 0.5 mg PO given at 1419 without relief as pt continues to escalate, became increasingly agitated. Agitation protocol administered at 1455. Pt continues to need increased prompts / verbal redirections and support at this time. Will inform assigned provide of event. 1:1 observation maintained with assigned staff in attendance at all times.

## 2023-10-01 NOTE — Plan of Care (Signed)
  Problem: Education: Goal: Emotional status will improve Outcome: Progressing Goal: Mental status will improve Outcome: Progressing   Problem: Activity: Goal: Sleeping patterns will improve Outcome: Progressing   

## 2023-10-01 NOTE — Plan of Care (Signed)
Problem: Activity: Goal: Interest or engagement in activities will improve Outcome: Progressing   Problem: Health Behavior/Discharge Planning: Goal: Compliance with treatment plan for underlying cause of condition will improve Outcome: Progressing   Problem: Safety: Goal: Periods of time without injury will increase Outcome: Progressing Pt awake in bed during safety rounds. Tolerated dinner, fluids and medications well. Observed with improvement in tremors and compulsive behavior when reassessed at 1550. Emotional support, encouragement and reassurance offered to pt this shift.  1:1 observation maintained for safety with assigned MHT staff in attendance at all times.

## 2023-10-01 NOTE — Progress Notes (Signed)
   10/01/23 2215  Psych Admission Type (Psych Patients Only)  Admission Status Voluntary  Psychosocial Assessment  Patient Complaints Anxiety;Restlessness  Eye Contact Fair  Facial Expression Sad;Flat  Affect Anxious  Speech Logical/coherent  Interaction Minimal  Motor Activity Tremors  Appearance/Hygiene Disheveled  Behavior Characteristics Cooperative  Mood Anxious  Aggressive Behavior  Effect No apparent injury  Thought Process  Coherency WDL  Content WDL  Delusions WDL  Perception WDL  Hallucination None reported or observed  Judgment Impaired  Confusion WDL  Danger to Self  Current suicidal ideation? Denies

## 2023-10-02 DIAGNOSIS — F251 Schizoaffective disorder, depressive type: Secondary | ICD-10-CM | POA: Diagnosis not present

## 2023-10-02 MED ORDER — CLOZAPINE 100 MG PO TABS
200.0000 mg | ORAL_TABLET | Freq: Every day | ORAL | Status: DC
  Administered 2023-10-02 – 2023-10-04 (×3): 200 mg via ORAL
  Filled 2023-10-02 (×4): qty 2

## 2023-10-02 MED ORDER — LORAZEPAM 1 MG PO TABS
1.0000 mg | ORAL_TABLET | Freq: Two times a day (BID) | ORAL | Status: DC
  Administered 2023-10-03 – 2023-10-16 (×27): 1 mg via ORAL
  Filled 2023-10-02 (×28): qty 1

## 2023-10-02 NOTE — Progress Notes (Addendum)
Pt awake in bed this afternoon, calm, conversing with assigned staff at present. Visited with mother earlier without issues. 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 Remains pleasant on interactions thus far. 1:1 observation maintained for safety without incident. Assigned staff in attendance at all times.

## 2023-10-02 NOTE — Progress Notes (Signed)
   10/02/23 2115  Psych Admission Type (Psych Patients Only)  Admission Status Voluntary  Psychosocial Assessment  Patient Complaints Anxiety;Restlessness;Nervousness  Eye Contact Fair  Facial Expression Sad;Flat  Affect Anxious  Speech Logical/coherent  Interaction Minimal  Motor Activity Tremors  Appearance/Hygiene Disheveled  Behavior Characteristics Cooperative  Mood Anxious;Preoccupied  Aggressive Behavior  Effect No apparent injury  Thought Process  Coherency WDL  Content WDL  Delusions WDL  Perception WDL  Hallucination None reported or observed  Judgment Impaired  Confusion WDL  Danger to Self  Current suicidal ideation? Denies

## 2023-10-02 NOTE — Progress Notes (Signed)
Nursing 1:1 note D:Pt observed sleeping in bed with eyes closed. RR even and unlabored. No distress noted. A: 1:1 observation continues for safety  R: pt remains safe  

## 2023-10-02 NOTE — Progress Notes (Signed)
Pt noted stripping off his clothes in room, flooded the commode X1. Became increasingly irritable, restless, fidgety, unable to sit still in bed or on bench in his room when redirected "No, no, no". Attempted to go back in the bathroom despite multiple prompts earlier this evening. Not able to be distracted with walking or coloring as he continues to escalate with compulsive flushing of commode and paper towel use. PRN agitation protocol administered at 1627 with desired effect when reassessed at 1725. Assigned provider made aware, came to unit to assess pt. New order received for scheduled Ativan 1 mg PO BID.  Awake in bed conversing with assigned 1:1 MHT staff and eating dinner during safety checks. Vitals done, WNL. Pt took his scheduled evening medications without issues. 1:1 observation maintained as ordered. Continued support, encouragement and reassurance offered to pt.

## 2023-10-02 NOTE — Progress Notes (Signed)
[facility] LCSW Progress Note   10/02/2023    1:26 PM  Onalee Hua Jelinek      Type of Note: Follow up with Group Home    CSW spoke with Ms. Freida Busman and shared that patient Brady Hoffman is Saturday and Freida Busman stated that she does not do admission on weekends and prefers tomorrow or Monday. Ms. Freida Busman also asked if the provider could call her and provide an update on patient and the MRI. CSW shared information with providers and will continue to assist.     Signed:   Jacob Moores, MSW, LCSWA 10/02/2023 1:26 PM

## 2023-10-02 NOTE — Progress Notes (Signed)
Adult Psychoeducational Group Note  Date:  10/02/2023 Time:  10:13 AM  Group Topic/Focus:  Goals Group:   The focus of this group is to help patients establish daily goals to achieve during treatment and discuss how the patient can incorporate goal setting into their daily lives to aide in recovery.  Participation Level:  Active  Participation Quality:  Appropriate  Affect:  Appropriate  Cognitive:  Appropriate  Insight: Appropriate  Engagement in Group:  Engaged  Modes of Intervention:  Discussion  Additional Comments: The patient engaged in group.  Octavio Manns 10/02/2023, 10:13 AM

## 2023-10-02 NOTE — Group Note (Signed)
Recreation Therapy Group Note   Group Topic:Leisure Education  Group Date: 10/02/2023 Start Time: 1015 End Time: 1040 Facilitators: Blakeley Margraf-McCall, LRT,CTRS Location: [address] Dayroom   Group Topic: Leisure Education  Goal Area(s) Addresses:  Patient will successfully identify positive leisure and recreation activities.  Patient will acknowledge benefits of participation in healthy leisure activities post discharge.  Patient will actively work with peers toward a shared goal.   Intervention: Competitive Group Game    Group Description: IT trainer. In groups of 5-7, patients took turns trying to guess the picture being drawn on the board by their teammate.  If the team guessed the correct answer, they won a point.  If the team guessed wrong, the other team got a chance to steal the point. After several rounds of game play, the team with the most points were declared winners. Post-activity discussion reviewed benefits of positive recreation outlets: reducing stress, improving coping mechanisms, increasing self-esteem, and building larger support systems.   Education:  Teacher, English as a foreign language, Leisure as Merchant navy officer, Programmer, applications, Building control surveyor   Education Outcome: Acknowledges education/In group clarification offered/Needs additional education   Affect/Mood: N/A   Participation Level: Did not attend    Clinical Observations/Individualized Feedback:     Plan: Continue to engage patient in RT group sessions 2-3x/week.   Eddye Broxterman-McCall, LRT,CTRS 10/02/2023 11:33 AM

## 2023-10-02 NOTE — Plan of Care (Signed)
Collateral Call  I spoke with Group Home administrator Ms. Allen regarding patient medication regimen and MRI results. I discussed that since we are changing a few of the patient's medications that we would like to monitor his response in the coming days. I discussed that the earliest for discharge would be next Monday and she agrees. I also discussed how a clozapine level should be checked on 11/5 or later.  Kizzie Ide, MD PGY-2 Psychiatry

## 2023-10-02 NOTE — Plan of Care (Signed)
Problem: Activity: Goal: Interest or engagement in activities will improve Outcome: Progressing Goal: Sleeping patterns will improve Outcome: Progressing   Pt awake in bed conversing with staff when approached with evening medications. Tolerated dinner and fluids well. Denies concerns at this time. 1:1 observation maintained for safety without incident. Assigned staff in attendance at all times.

## 2023-10-02 NOTE — Progress Notes (Addendum)
[facility] MD Progress Note  10/02/2023 9:31 AM Brady Hoffman  MRN:  [id number]  Principal Problem: Schizoaffective disorder, depressive type (HCC) Diagnosis: Principal Problem:   Schizoaffective disorder, depressive type (HCC)  Reason for Admission:  Brady Hoffman is a 57 year old Caucasian male with prior psychiatric history significant for schizoaffective disorder, seizure disorder, developmental delay, alcohol use disorder, who presents voluntarily [facility] from [facility] health ED at [facility] for evaluation for his mental health illness and frequent incidence of falls. After medical evaluation/stabilization & clearance, he was transferred to the [facility] for further psychiatric evaluation & treatments (admitted on 09/21/2023, total  LOS: 11 days )  Chart Review from last 24 hours:  The patient's chart was reviewed and nursing notes were reviewed. Vitals signs: stable. The patient's case was discussed in multidisciplinary team meeting. Per [facility], patient was taking medications appropriately. Yesterday, patient had a concern for EPS which bilateral arm stiffness, tachycardia, and diaphoresis. Patient received PO Ativan 0.5 mg at 1419 and no improvement seen so then patient was given IM Benadryl 50 mg and Ativan 2 mg at 1455. Patient's symptoms improved as he was re-evaluated and no arm stiffness was seen. Patient is calm and attended no group sessions.     Information Obtained Today During Patient Interview: The patient was seen eating his breakfast this AM, no acute distress. On assessment, the patient feels "fine" today. Patient has not been going to group sessions and not able to reason why. When asked about speaking with family or friends, he states his mother visited yesterday. Patient reports having fair sleep and good appetite. He remembers the possible EPS episode yesterday and states that would occur about once every other day when he was staying in the group home and he describes the episode  as "sweating and shaking with the thoughts about germs". Patient feels that the medications have been fine and denies adverse effects. When asked about depression and anxiety, patient reports it has been improving and rates the severity a 7/10.    Denies SI, HI, AVH.   Past Psychiatric History:  Previous Psych Diagnoses: Panic disorder with agoraphobia, history of seizures, history of alcohol use disorder, schizoaffective disorder Prior inpatient treatment: Was admitted to [facility] 5 years ago Current/prior outpatient treatment: Yes with Dr. Jannifer Franklin Prior rehab hx: Denies Psychotherapy hx: Yes History of suicide: Denies History of homicide or aggression: Denies Psychiatric medication history: Patient has been on trial Seroquel, Prolixin, Effexor, and Haldol Psychiatric medication compliance history: Compliance Neuromodulation history: Denies Current Psychiatrist: Dr. Jannifer Franklin Current therapist: Dr. 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    Pervasive developmental disorder    Schizoaffective disorder    Seizures (HCC)    Family History: Medical: Patient unsure Psych: Patient unsure Psych Rx: Patient unsure SA/HA: Patient unsure Substance use family hx: Patient unsure  Social History:  Childhood (bring, raised, lives now, parents, siblings, schooling, education): Some college Abuse: Denies Marital Status: Denies Sexual orientation: Male from birth Children: No children Employment: Unemployed Peer Group: Denies peer group Housing: Lives at the group home Finances: Some financial difficulty Legal: Denies 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 [employer]  Current Medications: Current Facility-Administered Medications  Medication Dose Route Frequency Provider Last Rate Last Admin    acetaminophen (TYLENOL)  tablet 650 mg  650 mg Oral Q6H PRN Dahlia Byes C, NP   650 mg at 10/01/23 0837   alum & mag hydroxide-simeth (MAALOX/MYLANTA) 200-200-20 MG/5ML suspension 30 mL  30 mL Oral Q4H PRN Dahlia Byes C, NP       aspirin chewable tablet 81 mg  81 mg Oral Daily Onuoha, Josephine C, NP   81 mg at 10/02/23 0849   atorvastatin (LIPITOR) tablet 20 mg  20 mg Oral QHS Dahlia Byes C, NP   20 mg at 10/01/23 2105   bismuth subsalicylate (PEPTO BISMOL) chewable tablet 524 mg  524 mg Oral Q3H PRN Augusto Gamble, MD       cloZAPine (CLOZARIL) tablet 200 mg  200 mg Oral Daily Rex Kras, MD   200 mg at 10/02/23 0849   cloZAPine (CLOZARIL) tablet 200 mg  200 mg Oral QHS Kizzie Ide B, MD       diphenhydrAMINE (BENADRYL) capsule 50 mg  50 mg Oral TID PRN Dahlia Byes C, NP   50 mg at 09/27/23 1024   Or   diphenhydrAMINE (BENADRYL) injection 50 mg  50 mg Intramuscular TID PRN Dahlia Byes C, NP   50 mg at 10/01/23 1455   diphenhydrAMINE (BENADRYL) capsule 50 mg  50 mg Oral QHS Attiah, Nadir, MD   50 mg at 10/01/23 2105   docusate sodium (COLACE) capsule 100 mg  100 mg Oral Daily Dahlia Byes C, NP   100 mg at 10/02/23 0849   FLUoxetine (PROZAC) capsule 60 mg  60 mg Oral Daily Massengill, Harrold Donath, MD   60 mg at 10/02/23 0849   influenza vac split trivalent PF (FLULAVAL) injection 0.5 mL  0.5 mL Intramuscular Tomorrow-1000 Massengill, Harrold Donath, MD       levETIRAcetam (KEPPRA) tablet 500 mg  500 mg Oral BID Dahlia Byes C, NP   500 mg at 10/02/23 0849   levothyroxine (SYNTHROID) tablet 50 mcg  50 mcg Oral Q0600 Dahlia Byes C, NP   50 mcg at 10/02/23 0618   LORazepam (ATIVAN) tablet 2 mg  2 mg Oral TID PRN Dahlia Byes C, NP   2 mg at 09/27/23 1023   Or   LORazepam (ATIVAN) injection 2 mg  2 mg Intramuscular TID PRN Dahlia Byes C, NP   2 mg at 10/01/23 1455   LORazepam (ATIVAN) tablet 0.5 mg  0.5 mg Oral Q8H PRN Dahlia Byes C, NP   0.5  mg at 10/01/23 1419   mupirocin cream (BACTROBAN) 2 %   Topical Daily Massengill, Harrold Donath, MD   Given at 10/02/23 0850   ondansetron (ZOFRAN) tablet 8 mg  8 mg Oral Q8H PRN Augusto Gamble, MD       pantoprazole (PROTONIX) EC tablet 80 mg  80 mg Oral Daily Onuoha, Josephine C, NP   80 mg at 10/02/23 0848   polyethylene glycol (MIRALAX / GLYCOLAX) packet 17 g  17 g Oral Daily PRN Augusto Gamble, MD       senna (SENOKOT) tablet 8.6 mg  1 tablet Oral QHS PRN Augusto Gamble, MD        Lab Results:  No results found for this or any previous visit (from the past 48 hour(s)).   Blood Alcohol level:  Lab Results  Component Value Date   ETH <10 09/19/2023   ETH <10 09/28/2019    Metabolic Labs: Lab Results  Component Value Date   HGBA1C 5.7 (H) 09/23/2023   MPG 116.89 09/23/2023   MPG 114 02/25/2014   No  results found for: "PROLACTIN" Lab Results  Component Value Date   CHOL 144 07/03/2023   TRIG 152 (H) 07/03/2023   HDL 37 (L) 07/03/2023   CHOLHDL 3.9 07/03/2023   VLDL 28 10/20/2009   LDLCALC 82 07/03/2023   LDLCALC 59 10/02/2021    Physical Findings: AIMS: cogwheeling noted on right arm and wrist Tremors: present when eating  CIWA:    COWS:     Psychiatric Specialty Exam:  General Appearance: appears at stated age, casually dressed and groomed   Behavior: pleasant and cooperative   Psychomotor Activity: tremor in hands seen while eating  Eye Contact: fair  Speech: normal amount, tone, volume and fluency    Mood: euthymic  Affect: congruent, pleasant  Thought Process: linear, goal directed, no circumstantial or tangential thought process noted, no racing thoughts or flight of ideas  Descriptions of Associations: intact   Thought Content Hallucinations: denies AH, VH , does not appear responding to stimuli  Delusions: no paranoia, delusions of control, grandeur, ideas of reference, thought broadcasting, and magical thinking  Suicidal Thoughts: denies SI, intention, plan   Homicidal Thoughts: denies HI, intention, plan   Alertness/Orientation: alert and fully oriented   Insight: limited Judgment: fair  Memory: impaired   Executive Functions  Concentration: intact  Attention Span: fair  Recall: intact  Fund of Knowledge: fair     Vital Signs: Blood pressure 116/85, pulse 84, temperature 98.2 F (36.8 C), temperature source Oral, resp. rate 16, height 6\' 3"  (1.905 m), weight 94.8 kg, SpO2 99%. Body mass index is 26.12 kg/m.  Physical Exam  General: Pleasant, well-appearing. No acute distress. Pulmonary: Normal effort. No wheezing or rales. Skin: No obvious rash or lesions. Neuro: A&Ox3.No focal deficit.   Review of Systems  No reported symptoms  Assets  Assets: Resilience   Treatment Plan Summary: Daily contact with patient to assess and evaluate symptoms and progress in treatment and Medication management  Diagnoses / Active Problems: Schizoaffective disorder, depressive type (HCC) Principal Problem:   Schizoaffective disorder, depressive type (HCC)   ASSESSMENT: Brady Hoffman is a 57 year old Caucasian male with prior psychiatric history significant for schizoaffective disorder, seizure disorder, developmental delay, alcohol use disorder, who presents voluntarily [facility] from [facility] health ED at [facility] for evaluation for his mental health illness and frequent incidence of falls. After medical evaluation/stabilization & clearance, he was transferred to the [facility] for further psychiatric evaluation & treatments.   PLAN: Safety and Monitoring:  -- Voluntary admission to inpatient psychiatric unit for safety, stabilization and treatment  -- Daily contact with patient to assess and evaluate symptoms and progress in treatment  -- Patient's case to be discussed in multi-disciplinary team meeting  -- Observation Level : q15 minute checks  -- Vital signs:  q12 hours  -- Precautions: suicide, elopement, and assault  2.  Interventions (medications, psychoeducation, etc):  -- decrease to clozapine 200 mg daily in the morning and 200 mg at bedtime (was previously 275 mg at bedtime->decreased to 250 mg on 10/28) --repeat clozapine level on 10/27 was 701 (was 1723 on 10/19) --repeat clozapine level on 11/5 or later -- start Benadryl 50 mg at bedtime for concern for EPS symptoms  -- continue fluoxetine 60 mg daily for depressive symptoms, anxiety  -- d/c lorazepam 0.5 mg BID for recent catatonic episode earlier in the hospitalization (10/22) -- d/c benztropine 1 mg twice daily  -- removed haloperidol from agitation protocol due to worsening cholinergic symptoms -- medical management: d/c atropine at bedtime  for droolingpt now having dry mouth, aspirin, atorvastatin, docusate sodium, levetiracetam, levothyroxine, and pantoprazole -- Patient does not need nicotine replacement  PRN medications for symptomatic management:              -- lorazepam 0.5 mg every 8 hours as needed for anxiety              -- continue acetaminophen 650 mg every 6 hours as needed for mild to moderate pain, fever, and headaches              -- continue bismuth subsalicylate 524 mg oral chewable tablet every 3 hours as needed for diarrhea / loose stools              -- continue senna 8.6 mg oral at bedtime and polyethylene glycol 17 g oral daily as needed for mild to moderate constipation              -- continue ondansetron 8 mg every 8 hours as needed for nausea or vomiting              -- continue aluminum-magnesium hydroxide + simethicone 30 mL every 4 hours as needed for heartburn or indigestion  -- As needed agitation protocol in-place  The risks/benefits/side-effects/alternatives to the above medication were discussed in detail with the patient and time was given for questions. The patient consents to medication trial. FDA black box warnings, if present, were discussed.  The patient is agreeable with the medication plan, as above. We  will monitor the patient's response to pharmacologic treatment, and adjust medications as necessary.  3. Routine and other pertinent labs:             -- Metabolic profile:  BMI: Body mass index is 26.12 kg/m.  Prolactin: No results found for: "PROLACTIN"  Lipid Panel: Lab Results  Component Value Date   CHOL 144 07/03/2023   TRIG 152 (H) 07/03/2023   HDL 37 (L) 07/03/2023   CHOLHDL 3.9 07/03/2023   VLDL 28 10/20/2009   LDLCALC 82 07/03/2023   LDLCALC 59 10/02/2021    HbgA1c: Hgb A1c MFr Bld (%)  Date Value  09/23/2023 5.7 (H)    TSH: TSH  Date Value  09/24/2023 2.765 uIU/mL  07/03/2023 4.03 mIU/L   Clozapine on 10/19 is 1723 EKG monitoring: QTc: 479 on 09/27/2023  4. Group Therapy:  -- Encouraged patient to participate in unit milieu and in scheduled group therapies   -- Short Term Goals: Ability to identify changes in lifestyle to reduce recurrence of condition, verbalize feelings, identify and develop effective coping behaviors, maintain clinical measurements within normal limits, and identify triggers associated with substance abuse/mental health issues will improve. Improvement in ability to demonstrate self-control and comply with prescribed medications.  -- Long Term Goals: Improvement in symptoms so as ready for discharge -- Patient is encouraged to participate in group therapy while admitted to the psychiatric unit. -- We will address other chronic and acute stressors, which contributed to the patient's Schizoaffective disorder, depressive type (HCC) in order to reduce the risk of self-harm at discharge.  5. Discharge Planning:   -- Social work and case management to assist with discharge planning and identification of hospital follow-up needs prior to discharge  -- Estimated LOS: 2-3 days  -- Discharge Concerns: Need to establish a safety plan; Medication compliance and effectiveness  -- Discharge Goals: Return to the group home with outpatient referrals  for mental health follow-up including medication management/psychotherapy  I certify that inpatient  services furnished can reasonably be expected to improve the patient's condition.   Signed: Lance Muss, MD 10/02/2023, 9:32 AM

## 2023-10-02 NOTE — Group Note (Signed)
Date:  10/02/2023 Time:  10:25 PM  Group Topic/Focus:  Wrap-Up Group:   The focus of this group is to help patients review their daily goal of treatment and discuss progress on daily workbooks.    Participation Level:  Did Not Attend  Participation Quality:   N/A  Affect:   N/A  Cognitive:   N/A  Insight: None  Engagement in Group:   N/A  Modes of Intervention:   N/A  Additional Comments:  Patient did not attend wrap up group.   Brady Hoffman 10/02/2023, 10:25 PM

## 2023-10-02 NOTE — Progress Notes (Signed)
Pt awake in dayroom in scheduled groups with peers and assigned MHT staff. Denies SI, HI, AVH and pain when assessed. Remains medication compliant and concrete on interactions. Cooperative with care. 1:1 observation maintained for safety without incident. Assigned staff in attendance at all times.

## 2023-10-02 NOTE — Plan of Care (Signed)
  Problem: Activity: Goal: Sleeping patterns will improve Outcome: Progressing   Problem: Coping: Goal: Ability to demonstrate self-control will improve Outcome: Progressing   Problem: Safety: Goal: Periods of time without injury will increase Outcome: Progressing   Problem: Activity: Goal: Interest or engagement in activities will improve Outcome: Not Progressing

## 2023-10-03 DIAGNOSIS — F251 Schizoaffective disorder, depressive type: Secondary | ICD-10-CM | POA: Diagnosis not present

## 2023-10-03 NOTE — Progress Notes (Signed)
Pt awake in bed on initial approach. Denies SI, HI, AVH and pain when assessed. Compliant with medications, denies adverse drug reactions. Observed with brief eye contact, fidgety but cooperative with care and walker use, redirectable at this time. Attended scheduled morning groups with prompts. Remains medication compliant. 1:1 observation maintained as ordered without incident.

## 2023-10-03 NOTE — Progress Notes (Signed)
Nursing 1:1 note D:Pt observed sleeping in bed with eyes closed. RR even and unlabored. No distress noted. A: 1:1 observation continues for safety  R: pt remains safe  

## 2023-10-03 NOTE — Group Note (Signed)
Recreation Therapy Group Note   Group Topic:Healthy Decision Making  Group Date: 10/03/2023 Start Time: 1040 End Time: 1130 Facilitators: Mayreli Alden-McCall, LRT,CTRS Location: [address] Dayroom   Group Topic: Decision Making, Problem Solving, Communication  Goal Area(s) Addresses:  Patient will effectively work with peer towards shared goal.  Patient will identify factors that guided their decision making.  Patient will pro-socially communicate ideas during group session.   Intervention: Survival Scenario - pencil, paper  Group Description: Patients were given a scenario that they were going to be stranded on a deserted [place] for several months before being rescued. Writer tasked them with making a list of 15 things they would choose to bring with them for "survival". The list of items was prioritized most important to least. Each patient would come up with their own list, then work together to create a new list of 15 items while in a group of 3-5 peers. LRT discussed each person's list and how it differed from others. The debrief included discussion of priorities, good decisions versus bad decisions, and how it is important to think before acting so we can make the best decision possible. LRT tied the concept of effective communication among group members to patient's support systems outside of the hospital and its benefit post discharge.  Education: Pharmacist, community, Priorities, Support System, Discharge Planning   Education Outcome: Acknowledges education/In group clarification/Needs additional education   Affect/Mood: Appropriate   Participation Level: Moderate   Participation Quality: Independent   Behavior: Attentive  and Cooperative   Speech/Thought Process: Coherent and Relevant   Insight: Fair   Judgement: Fair    Modes of Intervention: Activity   Patient Response to Interventions:  Attentive and Receptive   Education Outcome:  In group clarification offered     Clinical Observations/Individualized Feedback: Pt was active and receptive during group. Pt identified knife, candles, matches, first aid kit, canteen, tarp, survival food, tent, sleeping bag, stove, alcohol, rope, flashlight and cooking pots. Pt left group early and didn't return.      Plan: Continue to engage patient in RT group sessions 2-3x/week.   Shereese Bonnie-McCall, LRT,CTRS 10/03/2023 12:02 PM

## 2023-10-03 NOTE — [facility] Group Notes (Signed)
Adult Psychoeducational Group Note  Date:  10/03/2023 Time:  7:50 PM  Group Topic/Focus:  Goals Group:   The focus of this group is to help patients establish daily goals to achieve during treatment and discuss how the patient can incorporate goal setting into their daily lives to aide in recovery.  Participation Level:  Did Not Attend  Participation Quality:    Affect:    Cognitive:    Insight:   Engagement in Group:    Modes of Intervention:    Additional Comments:    Sheran Lawless 10/03/2023, 7:50 PM

## 2023-10-03 NOTE — Plan of Care (Signed)
  Problem: Education: Goal: Emotional status will improve Outcome: Progressing   Problem: Health Behavior/Discharge Planning: Goal: Compliance with treatment plan for underlying cause of condition will improve Outcome: Progressing   Problem: Physical Regulation: Goal: Ability to maintain clinical measurements within normal limits will improve Outcome: Progressing   Problem: Safety: Goal: Periods of time without injury will increase Outcome: Progressing   Pt took his scheduled evening medications without issues. Tolerated dinner and fluids well. Awake conversing with assigned staff. Denies concerns at this time. 1:1 observation maintained without incident to note thus far.

## 2023-10-03 NOTE — [facility] Group Notes (Addendum)

## 2023-10-03 NOTE — Progress Notes (Signed)
Pt asleep in bed. Tolerated lunch and fluids well. Visited with mother this afternoon. Safety maintained on 1:1 observation without incident a this time.

## 2023-10-03 NOTE — Progress Notes (Signed)
[facility] MD Progress Note  10/03/2023 1:37 PM Brady Hoffman  MRN:  [id number]  Principal Problem: Schizoaffective disorder, depressive type (HCC) Diagnosis: Principal Problem:   Schizoaffective disorder, depressive type (HCC)   Reason for Admission:  Brady Hoffman is a 57 year old Caucasian male with prior psychiatric history significant for schizoaffective disorder, seizure disorder, developmental delay, alcohol use disorder, who presents voluntarily [facility] from [facility] health ED at [facility] for evaluation for his mental health illness and frequent incidence of falls. After medical evaluation/stabilization & clearance, he was transferred to the [facility] for further psychiatric evaluation & treatments   (admitted on 09/21/2023, total  LOS: 12 days )  Yesterday, the psychiatry team made following recommendations:  The patient was evaluated around 4 PM yesterday evening by the attending physician, was found to have worsening restlessness and symptoms concerning for akathisia.  His Ativan was restarted, and is scheduled to receive 1 mg twice daily at 10 AM and 4 PM.  The patient was also started on Benadryl 50 mg nightly for EPS.  Pertinent information discussed during bed progression:  Mother visited overnight, appeared more grandiose when she visited. Akathesia noted int he evening, required agitation protocol.   PRNs required overnight:  Benadryl 50 mg 1X Ativan 2 mg 1X  Information Obtained Today During Patient Interview:  The patient was evaluated on the unit.  He reports adequate sleep and appetite.  He reports he is doing "better" than yesterday.  He reports his stiffness improves when Ativan was restarted, although he has continued to experience some restlessness in his digits.  On assessment, the patient is noted to be grasping and fidgeting with his fingers.  Patient is able to place his hands on his knees and extinguishes behavior upon request.  He has some dry mouth, which she says  is an ongoing symptom.  The patient describes his OCD symptoms worsening, he reports contamination of obsessions.  The patient reports he has had lingering hallucinations, but reports they have improved since he has difficulty telling if they are real or not.  He vaguely describes a hallucination in which he thought the son was rising in the setting quickly in his room.  He denies any auditory hallucinations.  Patient denied any suicidal ideations.  Denied homicidal ideations.   Past Psychiatric Hx: Previous Psych Diagnoses: Panic disorder with agoraphobia, history of seizures, history of alcohol use disorder, schizoaffective disorder Prior inpatient treatment: Was admitted to [facility] 5 years ago Current/prior outpatient treatment: Yes with Dr. Jannifer Hoffman Prior rehab hx: Denies Psychotherapy hx: Yes History of suicide: Denies History of homicide or aggression: Denies Psychiatric medication history: Patient has been on trial Seroquel, Prolixin, Effexor, and Haldol Psychiatric medication compliance history: Compliance Neuromodulation history: Denies Current Psychiatrist: Dr. Jannifer Hoffman Current therapist: Dr. Jannifer Hoffman   Substance Abuse Hx: Alcohol: Drinks 1-2 beers every 2 weeks Tobacco: Denies Illicit drugs: Denies Rx drug abuse: Denies Rehab hx: Denies   Past Medical History: Medical Diagnoses: Hyperlipidemia, anemia, encephalopathy, low back pain, history of seizures, history of acute renal failure. Home Rx: Yes Prior Hosp: Denies Prior Surgeries/Trauma: Denies Head trauma, LOC, concussions, seizures: History of seizures Allergies: Risperidone And Related   Other (See Comments) Not Specified   11/01/2011  Pt. 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 [employer] Past Medical History:  Past Medical History:  Diagnosis Date   Anemia 01/14/2014   Anxiety attack    Chronic kidney disease    Depression    H/O: GI bleed    Hyperlipidemia    Panic disorder with agoraphobia and severe panic attacks    Pervasive developmental disorder    Schizoaffective disorder    Seizures (HCC)    Family History:  Family History  Adopted: Yes    Current Medications: Current Facility-Administered Medications  Medication Dose Route Frequency Provider Last Rate Last Admin   acetaminophen (TYLENOL) tablet 650 mg  650 mg Oral Q6H PRN Brady Hoffman   650 mg at 10/01/23 0837   alum & mag hydroxide-simeth (MAALOX/MYLANTA) 200-200-20 MG/5ML suspension 30 mL  30 mL Oral Q4H PRN Brady Hoffman       aspirin chewable tablet 81 mg  81 mg Oral Daily Brady Hoffman, Brady Hoffman   81 mg at 10/03/23 0855   atorvastatin (LIPITOR) tablet 20 mg  20 mg Oral QHS Brady Hoffman, Brady Hoffman   20 mg at 10/02/23 2038   bismuth subsalicylate (PEPTO BISMOL) chewable tablet 524 mg  524 mg Oral Q3H PRN Brady Gamble, MD       cloZAPine (CLOZARIL) tablet 200 mg  200 mg Oral Daily Brady Kras, MD   200 mg at 10/03/23 0855   cloZAPine (CLOZARIL) tablet 200 mg  200 mg Oral QHS Brady Ide B, MD   200 mg at 10/02/23 2038   diphenhydrAMINE (BENADRYL) capsule 50 mg  50 mg Oral TID PRN Brady Hoffman   50 mg at 09/27/23 1024   Or   diphenhydrAMINE (BENADRYL) injection 50 mg  50 mg Intramuscular TID PRN Brady Hoffman   50 mg at 10/02/23 1627   diphenhydrAMINE (BENADRYL) capsule 50 mg  50 mg  Oral QHS Attiah, Nadir, MD   50 mg at 10/02/23 2038   docusate sodium (COLACE) capsule 100 mg  100 mg Oral Daily Brady Hoffman, Brady Hoffman   100 mg at 10/03/23 0855   FLUoxetine (PROZAC) capsule 60 mg  60 mg Oral Daily Massengill, Nathan, MD   60 mg at 10/03/23 0854   influenza vac split trivalent PF (FLULAVAL) injection 0.5 mL  0.5 mL Intramuscular Tomorrow-1000 Massengill, Harrold Donath, MD       levETIRAcetam (KEPPRA) tablet 500 mg  500 mg Oral BID Welford Roche, Brady Hoffman   500 mg at 10/03/23 0855   levothyroxine (SYNTHROID) tablet 50 mcg  50 mcg Oral Q0600 Brady Hoffman   50 mcg at 10/03/23 3329   LORazepam (ATIVAN) tablet 2 mg  2 mg Oral TID PRN Brady Hoffman   2 mg at 09/27/23 1023   Or   LORazepam (ATIVAN) injection 2 mg  2 mg Intramuscular TID PRN Brady Hoffman   2 mg at 10/02/23 1627   LORazepam (ATIVAN) tablet 0.5 mg  0.5 mg Oral Q8H PRN Brady Hoffman   0.5 mg at 10/01/23 1419   LORazepam (ATIVAN) tablet 1 mg  1 mg Oral BID Abbott Pao, Nadir, MD   1 mg at 10/03/23 1025   mupirocin cream (BACTROBAN) 2 %   Topical  Daily Massengill, Harrold Donath, MD   Given at 10/02/23 0850   ondansetron [facility]) tablet 8 mg  8 mg Oral Q8H PRN Brady Gamble, MD       pantoprazole (PROTONIX) EC tablet 80 mg  80 mg Oral Daily Brady Hoffman, Brady Hoffman   80 mg at 10/03/23 0854   polyethylene glycol (MIRALAX / GLYCOLAX) packet 17 g  17 g Oral Daily PRN Brady Gamble, MD       senna (SENOKOT) tablet 8.6 mg  1 tablet Oral QHS PRN Brady Gamble, MD        Lab Results: No results found for this or any previous visit (from the past 48 hour(s)).  Blood Alcohol level:  Lab Results  Component Value Date   ETH <10 09/19/2023   ETH <10 09/28/2019    Metabolic Labs: Lab Results  Component Value Date   HGBA1C 5.7 (H) 09/23/2023   MPG 116.89 09/23/2023   MPG 114 02/25/2014   No results found for: "PROLACTIN" Lab Results  Component Value Date   CHOL 144 07/03/2023   TRIG 152 (H) 07/03/2023    HDL 37 (L) 07/03/2023   CHOLHDL 3.9 07/03/2023   VLDL 28 10/20/2009   LDLCALC 82 07/03/2023   LDLCALC 59 10/02/2021    Physical Findings: AIMS completed  Psychiatric Specialty Exam:  Presentation  General Appearance: Fairly groomed Eye Contact: Fair Speech: Normal rate Speech Volume: Appropriate Handedness:-- (not assessed)   Mood and Affect  Mood:"o"  Affect:Appropriate; Congruent; Flat   Thought Process  Thought Processes:Coherent; Goal Directed; Linear  Descriptions of Associations:Intact  Orientation:grossly intact  Thought Content:WDL  History of Schizophrenia/Schizoaffective disorder:Yes Duration of Psychotic Symptoms:> 6 months Hallucinations:Denies Ideas of Reference:Denies  Suicidal Thoughts: Denies Homicidal Thoughts:  Denies  Sensorium  Memory: grossly intact Judgment:Fair Insight:Fair  Executive Functions  Concentration:Good Attention Span: Good Recall:Good Fund of Knowledge:Good Language:Good   Psychomotor Activity  Psychomotor Activity: Some restlessness at the hands  Assets  Assets:Resilience  Sleep  Sleep:Fair   Physical Exam: Physical Exam Vitals and nursing note reviewed.  HENT:     Head: Normocephalic and atraumatic.  Pulmonary:     Effort: Pulmonary effort is normal. No respiratory distress.  Skin:    General: Skin is warm and dry.  Neurological:     Mental Status: He is alert and oriented to person, place, and time.     Coordination: Coordination abnormal.     Gait: Gait abnormal.    Review of Systems  Constitutional: Negative.   Cardiovascular: Negative.   Psychiatric/Behavioral:  Negative for depression, hallucinations, substance abuse and suicidal ideas. The patient is nervous/anxious.    Blood pressure 108/80, pulse 88, temperature 98.5 F (36.9 Hoffman), temperature source Oral, resp. rate 20, height 6\' 3"  (1.905 m), weight 94.8 kg, SpO2 99%. Body mass index is 26.12 kg/m.  Treatment Plan Summary: Daily  contact with patient to assess and evaluate symptoms and progress in treatment and Medication management   ASSESSMENT: Onaje Warne is a 57 year old Caucasian male with prior psychiatric history significant for schizoaffective disorder, seizure disorder, developmental delay, alcohol use disorder, who presents voluntarily [facility] from [facility] ED at [facility] for evaluation for his mental health illness and frequent incidence of falls. After medical evaluation/stabilization & clearance, he was transferred to the [facility] for further psychiatric evaluation & treatments.     PLAN: Safety and Monitoring:             -- Voluntary admission to inpatient psychiatric unit for safety, stabilization  and treatment             -- Daily contact with patient to assess and evaluate symptoms and progress in treatment             -- Patient's case to be discussed in multi-disciplinary team meeting             -- Observation Level : q15 minute checks             -- Vital signs:  q12 hours             -- Precautions: suicide, elopement, and assault   2. Interventions (medications, psychoeducation, etc):  -- Continue clozapine 200 mg daily in the morning and 200 mg at bedtime (was previously 275 mg at bedtime->decreased to 250 mg on 10/28) --repeat clozapine level on 10/27 was 701 (was 1723 on 10/19) --repeat clozapine level on 11/5 or later -- Continue Benadryl 50 mg at bedtime for concern for EPS symptoms  -- Continue fluoxetine 60 mg daily for depressive symptoms, anxiety   -- d/Hoffman lorazepam 0.5 mg BID for recent catatonic episode earlier in the hospitalization (10/22) -- d/Hoffman benztropine 1 mg twice daily  -- removed haloperidol from agitation protocol due to worsening cholinergic symptoms -- medical management: d/Hoffman atropine at bedtime for droolingpt now having dry mouth, aspirin, atorvastatin, docusate sodium, levetiracetam, levothyroxine, and pantoprazole -- Patient does not need nicotine  replacement   PRN medications for symptomatic management:              -- lorazepam 0.5 mg every 8 hours as needed for anxiety              -- continue acetaminophen 650 mg every 6 hours as needed for mild to moderate pain, fever, and headaches              -- continue bismuth subsalicylate 524 mg oral chewable tablet every 3 hours as needed for diarrhea / loose stools              -- continue senna 8.6 mg oral at bedtime and polyethylene glycol 17 g oral daily as needed for mild to moderate constipation              -- continue ondansetron 8 mg every 8 hours as needed for nausea or vomiting              -- continue aluminum-magnesium hydroxide + simethicone 30 mL every 4 hours as needed for heartburn or indigestion             -- As needed agitation protocol in-place   The risks/benefits/side-effects/alternatives to the above medication were discussed in detail with the patient and time was given for questions. The patient consents to medication trial. FDA black box warnings, if present, were discussed.   The patient is agreeable with the medication plan, as above. We will monitor the patient's response to pharmacologic treatment, and adjust medications as necessary.   3. Routine and other pertinent labs: Metabolic profile: UrineBMI: Body mass index is 26.12 kg/m. Lipid Panel: Lipid Panel     Component Value Date/Time   CHOL 144 07/03/2023 1026   TRIG 152 (H) 07/03/2023 1026   HDL 37 (L) 07/03/2023 1026   CHOLHDL 3.9 07/03/2023 1026   VLDL 28 10/20/2009 2054   LDLCALC 82 07/03/2023 1026  HbgA1c: 5.7% TSH: 09/24/2023 2.765   Clozapine on 10/19 is 1723 EKG monitoring: QTc: 479 on 09/27/2023   4. Group Therapy:             --  Encouraged patient to participate in unit milieu and in scheduled group therapies              -- Short Term Goals: Ability to identify changes in lifestyle to reduce recurrence of condition, verbalize feelings, identify and develop effective coping behaviors,  maintain clinical measurements within normal limits, and identify triggers associated with substance abuse/mental health issues will improve. Improvement in ability to demonstrate self-control and comply with prescribed medications.             -- Long Term Goals: Improvement in symptoms so as ready for discharge -- Patient is encouraged to participate in group therapy while admitted to the psychiatric unit. -- We will address other chronic and acute stressors, which contributed to the patient's Schizoaffective disorder, depressive type (HCC) in order to reduce the risk of self-harm at discharge.   5. Discharge Planning:              -- Social work and case management to assist with discharge planning and identification of hospital follow-up needs prior to discharge             -- Estimated LOS: Tentatively Monday to Group Home             -- Discharge Concerns: Need to establish a safety plan; Medication compliance and effectiveness             -- Discharge Goals: Return to the group home with outpatient referrals for mental health follow-up including medication management/psychotherapy  I certify that inpatient services furnished can reasonably be expected to improve the patient's condition.   This note was created using a voice recognition software as a result there may be grammatical errors inadvertently enclosed that do not reflect the nature of this encounter. Every attempt is made to correct such errors.   Dr. Liston Alba, MD PGY-2, Psychiatry Residency  11/1/20241:37 PM

## 2023-10-03 NOTE — [facility] Group Notes (Signed)
[facility] Group Notes:  (Nursing/MHT/Case Management/Adjunct)  Date:  10/03/2023  Time:  8:47 PM  Type of Therapy:  Psychoeducational Skills  Participation Level:  Did Not Attend  Participation Quality:  Resistant  Affect:  Resistant  Cognitive:  Lacking  Insight:  None  Engagement in Group:  None  Modes of Intervention:  Education  Summary of Progress/Problems: The patient did not attend group this evening.   Hazle Coca S 10/03/2023, 8:47 PM

## 2023-10-04 DIAGNOSIS — F251 Schizoaffective disorder, depressive type: Secondary | ICD-10-CM | POA: Diagnosis not present

## 2023-10-04 NOTE — Progress Notes (Signed)
Pt in bed this morning. Pt denies SI/HI/AVH. Pt remains 1:1 with staff. Medication compliant. Did not attend group.

## 2023-10-04 NOTE — Progress Notes (Signed)
1:1 Note Patient was in bed the beginning of shift and was informed that the dayroom was open and group would take place and he declined reporting he didn't care to be in the dayroom. He did walk up to medication window and took his medication he also requested soap from his sitter so that he could shower. He ate snack, took a shower and got back in bed resting. 1:1 continues for fall safety.

## 2023-10-04 NOTE — Progress Notes (Signed)
 1:1 Nursing Note:  Patient in bed sleeping. No distress noted. Continues 1:1 for safety. Patient remains safe.

## 2023-10-04 NOTE — Progress Notes (Signed)
   10/04/23 1359  Important Message  Medicare important message given? Yes - Medicare IM    Ambrose Mantle, LCSW 10/04/2023, 2:00 PM

## 2023-10-04 NOTE — Progress Notes (Signed)
1:1 Nursing Note: Patient in bed. Received morning dose of Synthroid. Denies pain. No distress noted. Walker by bedside. Continues 1:1 for safety. Patient remains safe.

## 2023-10-04 NOTE — Progress Notes (Signed)
Correction to progress noted 10/04/23 at 1013. Pt does endorse auditory hallucinations. States they are negative in nature. Pt laying in bed fidgeting with hands. Pt also endorses obsessive thoughts. Pt declined when encouraged to perform hygiene or ambulate hallway despite encouragement from nurse.

## 2023-10-04 NOTE — Plan of Care (Signed)
  Problem: Education: Goal: Knowledge of [facility] Education information/materials will improve Outcome: Progressing Goal: Emotional status will improve Outcome: Progressing Goal: Mental status will improve Outcome: Progressing Goal: Verbalization of understanding the information provided will improve Outcome: Progressing   

## 2023-10-04 NOTE — [facility] Group Notes (Signed)
Adult Psychoeducational Group Note  Date:  10/04/2023 Time:  9:04 PM  Group Topic/Focus:  Wrap-Up Group:   The focus of this group is to help patients review their daily goal of treatment and discuss progress on daily workbooks.  Participation Level:  Did Not Attend  Christ Kick 10/04/2023, 9:04 PM

## 2023-10-04 NOTE — Progress Notes (Signed)
1:1 Nursing Note:  Patient in bed. Took bedtime medications. Denies pain. No distress noted. Walker by bedside. Continues 1:1 for safety. Patient remains safe.

## 2023-10-04 NOTE — Plan of Care (Signed)

## 2023-10-04 NOTE — Progress Notes (Signed)
[facility] MD Progress Note  10/04/2023 7:02 AM [name]  MRN:  [id number]  Principal Problem: Schizoaffective disorder, depressive type (HCC) Diagnosis: Principal Problem:   Schizoaffective disorder, depressive type (HCC)  Reason for Admission:  Brady Hoffman is a 57 year old Caucasian male with prior psychiatric history significant for schizoaffective disorder, seizure disorder, developmental delay, alcohol use disorder, who presents voluntarily [facility] from [facility] health ED at [facility] for evaluation for his mental health illness and frequent incidence of falls. After medical evaluation/stabilization & clearance, he was transferred to the [facility] for further psychiatric evaluation & treatments   (admitted on 09/21/2023, total  LOS: 13 days )  Pertinent information discussed during bed progression:  Slept 11.75 hours, did not attend grounds.  Otherwise no acute events overnight.  PRNs required overnight:  None  Information Obtained Today During Patient Interview:  The patient was evaluated at bedside and reports adequate sleep and appetite. He describes his mood as "germy," noting worsening contamination obsessions, which have led to hesitations about using the bathroom; however, he agrees to go this morning after discussion. The patient has been compliant with all scheduled medications and reports no side effects, stating he has been doing well with scheduled Ativan and Benadryl. During the interview, he denies suicidal and homicidal ideations. No acute psychiatric concerns are present.  Collateral call placed to patient's mother, [name], at [phone] [name] has some reservations about the patient being discharged on Monday, she expressed concern over his ongoing psychosis and symptoms of OCD.  She was reassured the patient has been improving.  Ultimately she agrees to meet with me tomorrow in person to discuss this discharge on Monday.  I explained to [name] that  patient's current presentation has significantly improved and does not require a prolonged hospitalization or additional psychotropic medications.  Discussed the risks associated with prolonging hospitalization, including worsening of his psychiatric symptoms and limited access to robust outpatient services.  All questions were answered.   Past Psychiatric Hx: Previous Psych Diagnoses: Panic disorder with agoraphobia, history of seizures, history of alcohol use disorder, schizoaffective disorder Prior inpatient treatment: Was admitted to [facility] 5 years ago Current/prior outpatient treatment: Yes with Dr. Jannifer Franklin Prior rehab hx: Denies Psychotherapy hx: Yes History of suicide: Denies History of homicide or aggression: Denies Psychiatric medication history: Patient has been on trial Seroquel, Prolixin, Effexor, and Haldol Psychiatric medication compliance history: Compliance Neuromodulation history: Denies Current Psychiatrist: Dr. Jannifer Franklin Current therapist: Dr. Jannifer Franklin   Substance Abuse Hx: Alcohol: Drinks 1-2 beers every 2 weeks Tobacco: Denies Illicit drugs: Denies Rx drug abuse: Denies Rehab hx: Denies   Past Medical History: Medical Diagnoses: Hyperlipidemia, anemia, encephalopathy, low back pain, history of seizures, history of acute renal failure. Home Rx: Yes Prior Hosp: Denies Prior Surgeries/Trauma: Denies Head trauma, LOC, concussions, seizures: History of seizures Allergies: Risperidone And Related   Other (See Comments) Not Specified   11/01/2011  Pt. 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 [employer] Past Medical History:  Past Medical History:  Diagnosis Date   Anemia 01/14/2014   Anxiety attack    Chronic kidney disease    Depression    H/O: GI bleed    Hyperlipidemia    Panic disorder with agoraphobia and severe panic attacks    Pervasive developmental disorder    Schizoaffective disorder    Seizures (HCC)    Family History:  Family History  Adopted: Yes    Current Medications: Current Facility-Administered Medications  Medication Dose Route Frequency Provider Last Rate Last Admin   acetaminophen (TYLENOL) tablet 650 mg  650 mg Oral Q6H PRN Dahlia Byes C, NP   650 mg at 10/01/23 0837   alum & mag hydroxide-simeth (MAALOX/MYLANTA) 200-200-20 MG/5ML suspension 30 mL  30 mL Oral Q4H PRN Dahlia Byes C, NP       aspirin chewable tablet 81 mg  81 mg Oral Daily Onuoha, Josephine C, NP   81 mg at 10/03/23 0855   atorvastatin (LIPITOR) tablet 20 mg  20 mg Oral QHS Onuoha, Josephine C, NP   20 mg at 10/03/23 2106   bismuth subsalicylate (PEPTO BISMOL) chewable tablet 524 mg  524 mg Oral Q3H PRN Augusto Gamble, MD       cloZAPine (CLOZARIL) tablet 200 mg  200 mg Oral Daily Rex Kras, MD   200 mg at 10/03/23 0855   cloZAPine (CLOZARIL) tablet 200 mg  200 mg Oral QHS Kizzie Ide B, MD   200 mg at 10/03/23 2106   diphenhydrAMINE (BENADRYL) capsule 50 mg  50 mg Oral TID PRN Dahlia Byes C, NP   50 mg at 09/27/23 1024   Or   diphenhydrAMINE (BENADRYL) injection 50 mg  50 mg Intramuscular TID PRN Dahlia Byes C, NP   50 mg at 10/02/23 1627   diphenhydrAMINE (BENADRYL) capsule 50 mg  50 mg Oral QHS Attiah, Nadir, MD   50 mg at 10/03/23 2106   docusate sodium (COLACE) capsule 100 mg  100 mg Oral Daily Onuoha, Josephine C, NP   100 mg at 10/03/23  0855   FLUoxetine (PROZAC) capsule 60 mg  60 mg Oral Daily Massengill, Harrold Donath, MD   60 mg at 10/03/23 0854   influenza vac split trivalent PF (FLULAVAL) injection 0.5 mL  0.5 mL Intramuscular Tomorrow-1000 Massengill, Harrold Donath, MD       levETIRAcetam (KEPPRA) tablet 500 mg  500 mg Oral BID Welford Roche, Josephine C, NP   500 mg at 10/03/23 1624   levothyroxine (SYNTHROID) tablet 50 mcg  50 mcg Oral Q0600 Dahlia Byes C, NP   50 mcg at 10/04/23 1610   LORazepam (ATIVAN) tablet 2 mg  2 mg Oral TID PRN Dahlia Byes C, NP   2 mg at 09/27/23 1023   Or   LORazepam (ATIVAN) injection 2 mg  2 mg Intramuscular TID PRN Dahlia Byes C, NP   2 mg at 10/02/23 1627   LORazepam (ATIVAN) tablet 0.5 mg  0.5 mg Oral Q8H PRN Dahlia Byes C, NP   0.5 mg at 10/01/23 1419   LORazepam (ATIVAN) tablet 1 mg  1 mg Oral BID Abbott Pao, Nadir, MD   1 mg at 10/03/23 1527   mupirocin cream (BACTROBAN) 2 %   Topical Daily Massengill, Harrold Donath, MD   Given at 10/02/23 0850   ondansetron (ZOFRAN) tablet 8 mg  8 mg Oral Q8H PRN Augusto Gamble, MD       pantoprazole (PROTONIX) EC tablet 80 mg  80 mg Oral Daily Dahlia Byes C, NP   80 mg at 10/03/23 [phone]  polyethylene glycol (MIRALAX / GLYCOLAX) packet 17 g  17 g Oral Daily PRN Augusto Gamble, MD       senna (SENOKOT) tablet 8.6 mg  1 tablet Oral QHS PRN Augusto Gamble, MD        Lab Results: No results found for this or any previous visit (from the past 48 hour(s)).  Blood Alcohol level:  Lab Results  Component Value Date   ETH <10 09/19/2023   ETH <10 09/28/2019    Metabolic Labs: Lab Results  Component Value Date   HGBA1C 5.7 (H) 09/23/2023   MPG 116.89 09/23/2023   MPG 114 02/25/2014   No results found for: "PROLACTIN" Lab Results  Component Value Date   CHOL 144 07/03/2023   TRIG 152 (H) 07/03/2023   HDL 37 (L) 07/03/2023   CHOLHDL 3.9 07/03/2023   VLDL 28 10/20/2009   LDLCALC 82 07/03/2023   LDLCALC 59 10/02/2021    Physical Findings: AIMS  completed  Psychiatric Specialty Exam:  Presentation  General Appearance: Fairly groomed Eye Contact: Fair Speech: Normal rate Speech Volume: Appropriate Handedness:-- (not assessed)   Mood and Affect  Mood:"I'm okk"  Affect:Flat   Thought Process  Thought Processes:Coherent; Goal Directed; Linear  Descriptions of Associations:Intact  Orientation:grossly intact  Thought Content:WDL  History of Schizophrenia/Schizoaffective disorder:Yes Duration of Psychotic Symptoms:> 6 months Hallucinations:Denies Ideas of Reference:Denies  Suicidal Thoughts: Denies Homicidal Thoughts:  Denies  Sensorium  Memory: grossly intact Judgment:Fair Insight:Fair  Executive Functions  Concentration:Good Attention Span: Good Recall:Good Fund of Knowledge:Good Language:Good   Psychomotor Activity  Psychomotor Activity: Normal  Assets  Assets:Resilience  Sleep  Sleep:Fair   Physical Exam: Physical Exam Vitals and nursing note reviewed.  HENT:     Head: Normocephalic and atraumatic.  Pulmonary:     Effort: Pulmonary effort is normal. No respiratory distress.  Skin:    General: Skin is warm and dry.  Neurological:     Mental Status: He is alert and oriented to person, place, and time.     Coordination: Coordination abnormal.     Gait: Gait abnormal.    Review of Systems  Constitutional: Negative.   Cardiovascular: Negative.   Psychiatric/Behavioral:  Negative for depression, hallucinations, substance abuse and suicidal ideas. The patient is nervous/anxious.    Blood pressure (!) 114/95, pulse 83, temperature 98.5 F (36.9 C), temperature source Oral, resp. rate 20, height 6\' 3"  (1.905 m), weight 94.8 kg, SpO2 99%. Body mass index is 26.12 kg/m.  Treatment Plan Summary: Daily contact with patient to assess and evaluate symptoms and progress in treatment and Medication management   ASSESSMENT: Brady Hoffman is a 57 year old Caucasian male with prior  psychiatric history significant for schizoaffective disorder, seizure disorder, developmental delay, alcohol use disorder, who presents voluntarily [facility] from [facility] health ED at [facility] for evaluation for his mental health illness and frequent incidence of falls. After medical evaluation/stabilization & clearance, he was transferred to the [facility] for further psychiatric evaluation & treatments.     PLAN: Safety and Monitoring:              -- Voluntary admission to inpatient psychiatric unit for safety, stabilization and treatment             -- Daily contact with patient to assess and evaluate symptoms and progress in treatment             -- Patient's case to be discussed in multi-disciplinary team meeting             --  Observation Level : 1:1 level of observation continued             -- Vital signs:  q12 hours             -- Precautions: suicide, elopement, and assault   2. Interventions (medications, psychoeducation, etc):  -- Continue clozapine 200 mg daily in the morning and 200 mg at bedtime (was previously 275 mg at bedtime->decreased to 250 mg on 10/28) --repeat clozapine level on 10/27 was 701 (was 1723 on 10/19) --repeat clozapine level order for 11/4 in AM -- Continue Benadryl 50 mg at bedtime for concern for EPS symptoms  -- Continue fluoxetine 60 mg daily for depressive symptoms, anxiety   -- d/c lorazepam 0.5 mg BID for recent catatonic episode earlier in the hospitalization (10/22) -- d/c benztropine 1 mg twice daily  -- removed haloperidol from agitation protocol due to worsening cholinergic symptoms -- medical management: d/c atropine at bedtime for droolingpt now having dry mouth, aspirin, atorvastatin, docusate sodium, levetiracetam, levothyroxine, and pantoprazole -- Patient does not need nicotine replacement   PRN medications for symptomatic management:              -- lorazepam 0.5 mg every 8 hours as needed for anxiety              -- continue acetaminophen 650  mg every 6 hours as needed for mild to moderate pain, fever, and headaches              -- continue bismuth subsalicylate 524 mg oral chewable tablet every 3 hours as needed for diarrhea / loose stools              -- continue senna 8.6 mg oral at bedtime and polyethylene glycol 17 g oral daily as needed for mild to moderate constipation              -- continue ondansetron 8 mg every 8 hours as needed for nausea or vomiting              -- continue aluminum-magnesium hydroxide + simethicone 30 mL every 4 hours as needed for heartburn or indigestion             -- As needed agitation protocol in-place   The risks/benefits/side-effects/alternatives to the above medication were discussed in detail with the patient and time was given for questions. The patient consents to medication trial. FDA black box warnings, if present, were discussed.   The patient is agreeable with the medication plan, as above. We will monitor the patient's response to pharmacologic treatment, and adjust medications as necessary.   3. Routine and other pertinent labs: Metabolic profile: UrineBMI: Body mass index is 26.12 kg/m. Lipid Panel: Lipid Panel     Component Value Date/Time   CHOL 144 07/03/2023 1026   TRIG 152 (H) 07/03/2023 1026   HDL 37 (L) 07/03/2023 1026   CHOLHDL 3.9 07/03/2023 1026   VLDL 28 10/20/2009 2054   LDLCALC 82 07/03/2023 1026  HbgA1c: 5.7% TSH: 09/24/2023 2.765   Clozapine on 10/19 is 1723 EKG monitoring: QTc: 479 on 09/27/2023   4. Group Therapy:             -- Encouraged patient to participate in unit milieu and in scheduled group therapies              -- Short Term Goals: Ability to identify changes in lifestyle to reduce recurrence of condition, verbalize feelings, identify and develop effective coping behaviors,  maintain clinical measurements within normal limits, and identify triggers associated with substance abuse/mental health issues will improve. Improvement in ability to  demonstrate self-control and comply with prescribed medications.             -- Long Term Goals: Improvement in symptoms so as ready for discharge -- Patient is encouraged to participate in group therapy while admitted to the psychiatric unit. -- We will address other chronic and acute stressors, which contributed to the patient's Schizoaffective disorder, depressive type (HCC) in order to reduce the risk of self-harm at discharge.   5. Discharge Planning:              -- Social work and case management to assist with discharge planning and identification of hospital follow-up needs prior to discharge             -- Estimated LOS: Tentatively Monday to Group Home             -- Discharge Concerns: Need to establish a safety plan; Medication compliance and effectiveness             -- Discharge Goals: Return to the group home with outpatient referrals for mental health follow-up including medication management/psychotherapy  I certify that inpatient services furnished can reasonably be expected to improve the patient's condition.   This note was created using a voice recognition software as a result there may be grammatical errors inadvertently enclosed that do not reflect the nature of this encounter. Every attempt is made to correct such errors.   Dr. Liston Alba, MD PGY-2, Psychiatry Residency  11/2/20247:02 AM

## 2023-10-05 DIAGNOSIS — F251 Schizoaffective disorder, depressive type: Secondary | ICD-10-CM | POA: Diagnosis not present

## 2023-10-05 LAB — COMPREHENSIVE METABOLIC PANEL
ALT: 18 U/L (ref 0–44)
AST: 26 U/L (ref 15–41)
Albumin: 3.9 g/dL (ref 3.5–5.0)
Alkaline Phosphatase: 139 U/L — ABNORMAL HIGH (ref 38–126)
Anion gap: 9 (ref 5–15)
BUN: 17 mg/dL (ref 6–20)
CO2: 26 mmol/L (ref 22–32)
Calcium: 8.7 mg/dL — ABNORMAL LOW (ref 8.9–10.3)
Chloride: 105 mmol/L (ref 98–111)
Creatinine, Ser: 1.46 mg/dL — ABNORMAL HIGH (ref 0.61–1.24)
GFR, Estimated: 56 mL/min — ABNORMAL LOW (ref >60)
Glucose, Bld: 89 mg/dL (ref 70–99)
Potassium: 3.9 mmol/L (ref 3.5–5.1)
Sodium: 140 mmol/L (ref 135–145)
Total Bilirubin: 0.4 mg/dL (ref 0.3–1.2)
Total Protein: 7.1 g/dL (ref 6.5–8.1)

## 2023-10-05 LAB — CK: Total CK: 650 U/L — ABNORMAL HIGH (ref 49–397)

## 2023-10-05 LAB — URINALYSIS, ROUTINE W REFLEX MICROSCOPIC
Bilirubin Urine: NEGATIVE
Glucose, UA: NEGATIVE mg/dL
Hgb urine dipstick: NEGATIVE
Ketones, ur: NEGATIVE mg/dL
Leukocytes,Ua: NEGATIVE
Nitrite: NEGATIVE
Protein, ur: NEGATIVE mg/dL
Specific Gravity, Urine: 1.017 (ref 1.005–1.030)
pH: 7 (ref 5.0–8.0)

## 2023-10-05 LAB — CBC WITH DIFFERENTIAL/PLATELET
Abs Immature Granulocytes: 0.02 10*3/uL (ref 0.00–0.07)
Basophils Absolute: 0 10*3/uL (ref 0.0–0.1)
Basophils Relative: 0 %
Eosinophils Absolute: 0.1 10*3/uL (ref 0.0–0.5)
Eosinophils Relative: 1 %
HCT: 40.2 % (ref 39.0–52.0)
Hemoglobin: 13.1 g/dL (ref 13.0–17.0)
Immature Granulocytes: 0 %
Lymphocytes Relative: 25 %
Lymphs Abs: 2 10*3/uL (ref 0.7–4.0)
MCH: 30.9 pg (ref 26.0–34.0)
MCHC: 32.6 g/dL (ref 30.0–36.0)
MCV: 94.8 fL (ref 80.0–100.0)
Monocytes Absolute: 0.7 10*3/uL (ref 0.1–1.0)
Monocytes Relative: 9 %
Neutro Abs: 5.3 10*3/uL (ref 1.7–7.7)
Neutrophils Relative %: 65 %
Platelets: 167 10*3/uL (ref 150–400)
RBC: 4.24 MIL/uL (ref 4.22–5.81)
RDW: 12.4 % (ref 11.5–15.5)
WBC: 8.3 10*3/uL (ref 4.0–10.5)
nRBC: 0 % (ref 0.0–0.2)

## 2023-10-05 MED ORDER — CLOZAPINE 100 MG PO TABS
150.0000 mg | ORAL_TABLET | Freq: Every day | ORAL | Status: DC
  Administered 2023-10-05 – 2023-10-08 (×4): 150 mg via ORAL
  Filled 2023-10-05 (×6): qty 2

## 2023-10-05 NOTE — Progress Notes (Signed)
1:1 Nursing note - Patient is currently lying in bed awake. He came to dayroom to have his vitals taken an returned to his room awaiting breakfast. He reports resting well on last night. 1:1 continues for safety.

## 2023-10-05 NOTE — Progress Notes (Signed)
1:1 Nursing note - Patient has been isolative to his room other than coming to the medication window for his meds and got him  a snack before returning to his room. Writer inquired about how he felt and if he still felt anxious and he replied when he hears the door slamming. He  denied feeling anxious and when Clinical research associate asked if he ever smiled, he smiled at Clinical research associate. 1:1 continues and patient is safe with sitter at his side.

## 2023-10-05 NOTE — Progress Notes (Signed)
Pt remains in bed this afternoon. Pt received labwork. Pt ate dinner. Pt calm, no compulsions noted this evening.

## 2023-10-05 NOTE — Plan of Care (Signed)
  Problem: Education: Goal: Emotional status will improve Outcome: Progressing   

## 2023-10-05 NOTE — Plan of Care (Signed)

## 2023-10-05 NOTE — Progress Notes (Signed)
Pt in room this afternoon with 1:1. Pt calm, denies a/v/h.

## 2023-10-05 NOTE — Progress Notes (Signed)
1:1 Note -Patient in bed asleep with no distress noted. Sitter at bedside and patient is safe. 1:1 continues

## 2023-10-05 NOTE — Progress Notes (Signed)
Pt observed in bathroom this morning for over 30 minutes compulsively pulling pants up and down. Staff assisted pt with coming out of bathroom and laying on bed. Nurse changed dressing to pts toes, during procedure pt several times jumped out of bed and pulled his pants down. Pt received ativan and benadryl IM per provider instruction. Pt denies voices this morning but does endorse anxiety. Staff remains with pt 1:1.

## 2023-10-05 NOTE — Progress Notes (Signed)
[facility] MD Progress Note  10/05/2023 7:11 AM Brady Hoffman  MRN:  [id number]  Principal Problem: Schizoaffective disorder, depressive type (HCC) Diagnosis: Principal Problem:   Schizoaffective disorder, depressive type (HCC)  Reason for Admission:  Brady Hoffman is a 57 year old Caucasian male with prior psychiatric history significant for schizoaffective disorder, seizure disorder, developmental delay, alcohol use disorder, who presents voluntarily [facility] from [facility] health ED at [facility] for evaluation for his mental health illness and frequent incidence of falls. After medical evaluation/stabilization & clearance, he was transferred to the [facility] for further psychiatric evaluation & treatments   (admitted on 09/21/2023, total  LOS: 14 days )  Pertinent information discussed during bed progression:  Slept 9.25 hours, having compulsions in bathroom but was redirectable.  PRNs required overnight:  None  Information Obtained Today During Patient Interview:  The patient was evaluated around 10 AM and was found to be restless, repeatedly standing and sitting in his bed, pulling his pants down and up uncontrollably. Pt's sitter was present and confirmed patient had been doing this all morning.  Attending physician, Dr. Abbott Pao,  was notified and evaluated patient. Pt's symptoms resolved following 1x injection of ativan 1 mg IM. Follow up labs were ordered including CMP, UA and CK.    10/05/2023: Had in person meeting with patient's mother, Brady Hoffman She is aware that patient had an episode of akathisia this morning which resolved with Ativan.  She reports that the patient has had episodes like this in his group home, and which resulted in a kidney injury at some point.  She agrees the patient requires further hospitalization.  All questions were answered.   Past Psychiatric Hx: Previous Psych Diagnoses: Panic disorder with agoraphobia, history of seizures, history of alcohol use  disorder, schizoaffective disorder Prior inpatient treatment: Was admitted to [facility] 5 years ago Current/prior outpatient treatment: Yes with Dr. Jannifer Franklin Prior rehab hx: Denies Psychotherapy hx: Yes History of suicide: Denies History of homicide or aggression: Denies Psychiatric medication history: Patient has been on trial Seroquel, Prolixin, Effexor, and Haldol Psychiatric medication compliance history: Compliance Neuromodulation history: Denies Current Psychiatrist: Dr. Jannifer Franklin Current therapist: Dr. Jannifer Franklin   Substance Abuse Hx: Alcohol: Drinks 1-2 beers every 2 weeks Tobacco: Denies Illicit drugs: Denies Rx drug abuse: Denies Rehab hx: Denies   Past Medical History: Medical Diagnoses: Hyperlipidemia, anemia, encephalopathy, low back pain, history of seizures, history of acute renal failure. Home Rx: Yes Prior Hosp: Denies Prior Surgeries/Trauma: Denies Head trauma, LOC, concussions, seizures: History of seizures Allergies: Risperidone And Related   Other (See Comments) Not Specified   11/01/2011  Pt. 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 [employer] Past Medical History:  Past Medical History:  Diagnosis Date   Anemia 01/14/2014   Anxiety attack    Chronic kidney disease    Depression    H/O: GI bleed    Hyperlipidemia    Panic disorder with agoraphobia and severe panic attacks     Pervasive developmental disorder    Schizoaffective disorder  Seizures (HCC)    Family History:  Family History  Adopted: Yes    Current Medications: Current Facility-Administered Medications  Medication Dose Route Frequency Provider Last Rate Last Admin   acetaminophen (TYLENOL) tablet 650 mg  650 mg Oral Q6H PRN Dahlia Byes C, NP   650 mg at 10/01/23 0837   alum & mag hydroxide-simeth (MAALOX/MYLANTA) 200-200-20 MG/5ML suspension 30 mL  30 mL Oral Q4H PRN Dahlia Byes C, NP       aspirin chewable tablet 81 mg  81 mg Oral Daily Onuoha, Josephine C, NP   81 mg at 10/04/23 1610   atorvastatin (LIPITOR) tablet 20 mg  20 mg Oral QHS Onuoha, Josephine C, NP   20 mg at 10/04/23 2058   bismuth subsalicylate (PEPTO BISMOL) chewable tablet 524 mg  524 mg Oral Q3H PRN Augusto Gamble, MD       cloZAPine (CLOZARIL) tablet 200 mg  200 mg Oral Daily Rex Kras, MD   200 mg at 10/04/23 9604   cloZAPine (CLOZARIL) tablet 200 mg  200 mg Oral QHS Kizzie Ide B, MD   200 mg at 10/04/23 2058   diphenhydrAMINE (BENADRYL) capsule 50 mg  50 mg Oral TID PRN Dahlia Byes C, NP   50 mg at 09/27/23 1024   Or   diphenhydrAMINE (BENADRYL) injection 50 mg  50 mg Intramuscular TID PRN Dahlia Byes C, NP   50 mg at 10/02/23 1627   diphenhydrAMINE (BENADRYL) capsule 50 mg  50 mg Oral QHS Attiah, Nadir, MD   50 mg at 10/04/23 2058   docusate sodium (COLACE) capsule 100 mg  100 mg Oral Daily Onuoha, Josephine C, NP   100 mg at 10/04/23 5409   FLUoxetine (PROZAC) capsule 60 mg  60 mg Oral Daily Massengill, Harrold Donath, MD   60 mg at 10/04/23 8119   influenza vac split trivalent PF (FLULAVAL) injection 0.5 mL  0.5 mL Intramuscular Tomorrow-1000 Massengill, Harrold Donath, MD       levETIRAcetam (KEPPRA) tablet 500 mg  500 mg Oral BID Dahlia Byes C, NP   500 mg at 10/04/23 1634   levothyroxine (SYNTHROID) tablet 50 mcg  50 mcg Oral Q0600 Dahlia Byes C, NP   50 mcg at 10/04/23 1478   LORazepam  (ATIVAN) tablet 2 mg  2 mg Oral TID PRN Dahlia Byes C, NP   2 mg at 09/27/23 1023   Or   LORazepam (ATIVAN) injection 2 mg  2 mg Intramuscular TID PRN Dahlia Byes C, NP   2 mg at 10/02/23 1627   LORazepam (ATIVAN) tablet 0.5 mg  0.5 mg Oral Q8H PRN Dahlia Byes C, NP   0.5 mg at 10/01/23 1419   LORazepam (ATIVAN) tablet 1 mg  1 mg Oral BID Abbott Pao, Nadir, MD   1 mg at 10/04/23 1634   mupirocin cream (BACTROBAN) 2 %   Topical Daily Massengill, Harrold Donath, MD   1 Application at 10/04/23 0839   ondansetron (ZOFRAN) tablet 8 mg  8 mg Oral Q8H PRN Augusto Gamble, MD       pantoprazole (PROTONIX) EC tablet 80 mg  80 mg Oral Daily Onuoha, Josephine C, NP   80 mg at 10/04/23 2956   polyethylene glycol (MIRALAX / GLYCOLAX) packet 17 g  17 g Oral Daily PRN Augusto Gamble, MD       senna (SENOKOT) tablet 8.6 mg  1 tablet Oral QHS PRN Augusto Gamble, MD        Lab Results: No results found for this or  any previous visit (from the past 48 hour(s)).  Blood Alcohol level:  Lab Results  Component Value Date   ETH <10 09/19/2023   ETH <10 09/28/2019    Metabolic Labs: Lab Results  Component Value Date   HGBA1C 5.7 (H) 09/23/2023   MPG 116.89 09/23/2023   MPG 114 02/25/2014   No results found for: "PROLACTIN" Lab Results  Component Value Date   CHOL 144 07/03/2023   TRIG 152 (H) 07/03/2023   HDL 37 (L) 07/03/2023   CHOLHDL 3.9 07/03/2023   VLDL 28 10/20/2009   LDLCALC 82 07/03/2023   LDLCALC 59 10/02/2021    Physical Findings: AIMS completed  Psychiatric Specialty Exam:  Presentation  General Appearance: Fairly groomed Eye Contact: Fair Speech: Normal rate Speech Volume: Appropriate Handedness:-- (not assessed)   Mood and Affect  Mood:"I'm ok"  Affect:Flat   Thought Process  Thought Processes:Coherent; Goal Directed; Linear  Descriptions of Associations:Intact  Orientation:grossly intact  Thought Content:WDL  History of Schizophrenia/Schizoaffective  disorder:Yes Duration of Psychotic Symptoms:> 6 months Hallucinations:Denies Ideas of Reference:Denies  Suicidal Thoughts: Denies Homicidal Thoughts:  Denies  Sensorium  Memory: Oriented to person, place, time Judgment:Fair Insight:Fair  Executive Functions  Concentration:Good Attention Span: Good Recall:Good Fund of Knowledge:Good Language:Good   Psychomotor Activity  Psychomotor Activity: Normal  Assets  Assets:Resilience; Social support  Sleep  Sleep:Fair   Physical Exam: Physical Exam Vitals and nursing note reviewed.  HENT:     Head: Normocephalic and atraumatic.  Pulmonary:     Effort: Pulmonary effort is normal. No respiratory distress.  Skin:    General: Skin is warm and dry.  Neurological:     Mental Status: He is alert and oriented to person, place, and time.     Coordination: Coordination abnormal.     Gait: Gait abnormal.    Review of Systems  Constitutional: Negative.   Cardiovascular: Negative.   Psychiatric/Behavioral:  Negative for depression, hallucinations, substance abuse and suicidal ideas. The patient is nervous/anxious.    Blood pressure (!) 114/95, pulse 83, temperature 98.5 F (36.9 C), temperature source Oral, resp. rate 20, height 6\' 3"  (1.905 m), weight 94.8 kg, SpO2 99%. Body mass index is 26.12 kg/m.  Treatment Plan Summary: Daily contact with patient to assess and evaluate symptoms and progress in treatment and Medication management   ASSESSMENT: Brady Hoffman is a 57 year old Caucasian male with prior psychiatric history significant for schizoaffective disorder, seizure disorder, developmental delay, alcohol use disorder, who presents voluntarily [facility] from [facility] health ED at [facility] for evaluation for his mental health illness and frequent incidence of falls. After medical evaluation/stabilization & clearance, he was transferred to the [facility] for further psychiatric evaluation & treatments.   10/05/2023 Patient was  evaluated this morning and was presenting with a new episode of akathisia and associated motor stiffness, as well as exacerbations of obsessive-compulsive behaviors related to contamination.  There was no evidence of psychosis on evaluation, the patient remained alert and oriented, was able to answer all questions.  Given his clinical picture, EPS and akathisia are likely exacerbated by his current medication regimen.  His symptoms promptly resolved following one-time administration of Ativan 1 mg IM as needed.  We are hesitant to increase his scheduled Ativan, due to risk of falls and excess sedation.  Today we plan to decrease scheduled clozapine, at the time of evaluation the patient had already received his morning dose of clozapine 200 mg.  New regiment will include clozapine 200 mg daily +150 mg nightly  with plans of ultimately lowering to 150 mg twice daily.  Patient is also receiving scheduled Prozac 60 mg for management of his OCD D, we can consider lowering dose as this may also be contributing to his akathetic episodes.  Patient was initially planned to be discharged tomorrow morning (Mon 11/4), we will hold on discharging patient for now.   PLAN: Safety and Monitoring:              -- Voluntary admission to inpatient psychiatric unit for safety, stabilization and treatment             -- Daily contact with patient to assess and evaluate symptoms and progress in treatment             -- Patient's case to be discussed in multi-disciplinary team meeting             -- Observation Level : 1:1 level of observation continued             -- Vital signs:  q12 hours             -- Precautions: suicide, elopement, and assault   2. Interventions (medications, psychoeducation, etc):  Clozapine regiment adjusted: Clozapine 200 mg daily + 150 mg nightly, with goal of titrating down to 150 mg twice daily to mitigate EPS/akathisia Repeat Ativan 1 mg IM as needed for onset of any new episodes Repeat  clozapine level order for 11/4 in AM  To-do: Attempt contacting group home to assess for how patient's akathetic episodes are managed there(per patient's mother this occurs while he is there) Continue Ativan 1 mg p.o. twice daily for akathisia Continue Benadryl 50 mg at bedtime for concern for EPS symptoms  Continue fluoxetine 60 mg daily for depressive symptoms, anxiety Consider down titrating if akathisia persists  -- removed haloperidol from agitation protocol due to worsening cholinergic symptoms -- medical management: d/c atropine at bedtime for droolingpt now having dry mouth, aspirin, atorvastatin, docusate sodium, levetiracetam, levothyroxine, and pantoprazole -- Patient does not need nicotine replacement   PRN medications for symptomatic management:              -- lorazepam 0.5 mg every 8 hours as needed for anxiety              -- continue acetaminophen 650 mg every 6 hours as needed for mild to moderate pain, fever, and headaches              -- continue bismuth subsalicylate 524 mg oral chewable tablet every 3 hours as needed for diarrhea / loose stools              -- continue senna 8.6 mg oral at bedtime and polyethylene glycol 17 g oral daily as needed for mild to moderate constipation              -- continue ondansetron 8 mg every 8 hours as needed for nausea or vomiting              -- continue aluminum-magnesium hydroxide + simethicone 30 mL every 4 hours as needed for heartburn or indigestion             -- As needed agitation protocol in-place   The risks/benefits/side-effects/alternatives to the above medication were discussed in detail with the patient and time was given for questions. The patient consents to medication trial. FDA black box warnings, if present, were discussed.   The patient is agreeable with the medication plan, as  above. We will monitor the patient's response to pharmacologic treatment, and adjust medications as necessary.   3. Routine and other  pertinent labs: Metabolic profile: UrineBMI: Body mass index is 26.12 kg/m. Lipid Panel: Lipid Panel     Component Value Date/Time   CHOL 144 07/03/2023 1026   TRIG 152 (H) 07/03/2023 1026   HDL 37 (L) 07/03/2023 1026   CHOLHDL 3.9 07/03/2023 1026   VLDL 28 10/20/2009 2054   LDLCALC 82 07/03/2023 1026  HbgA1c: 5.7% TSH: 09/24/2023 2.765   Clozapine on 10/19 is 1723 EKG monitoring: QTc: 479 on 09/27/2023   4. Group Therapy:             -- Encouraged patient to participate in unit milieu and in scheduled group therapies              -- Short Term Goals: Ability to identify changes in lifestyle to reduce recurrence of condition, verbalize feelings, identify and develop effective coping behaviors, maintain clinical measurements within normal limits, and identify triggers associated with substance abuse/mental health issues will improve. Improvement in ability to demonstrate self-control and comply with prescribed medications.             -- Long Term Goals: Improvement in symptoms so as ready for discharge -- Patient is encouraged to participate in group therapy while admitted to the psychiatric unit. -- We will address other chronic and acute stressors, which contributed to the patient's Schizoaffective disorder, depressive type (HCC) in order to reduce the risk of self-harm at discharge.   5. Discharge Planning:              -- Social work and case management to assist with discharge planning and identification of hospital follow-up needs prior to discharge             -- Estimated LOS: Holding discharge, pending improved control of EPS/akathesia             -- Discharge Concerns: Need to establish a safety plan; Medication compliance and effectiveness             -- Discharge Goals: Return to the group home with outpatient referrals for mental health follow-up including medication management/psychotherapy  I certify that inpatient services furnished can reasonably be expected to  improve the patient's condition.   This note was created using a voice recognition software as a result there may be grammatical errors inadvertently enclosed that do not reflect the nature of this encounter. Every attempt is made to correct such errors.   Dr. Liston Alba, MD PGY-2, Psychiatry Residency  11/3/20247:11 AM

## 2023-10-06 ENCOUNTER — Encounter (HOSPITAL_COMMUNITY): Payer: Self-pay

## 2023-10-06 DIAGNOSIS — F251 Schizoaffective disorder, depressive type: Secondary | ICD-10-CM | POA: Diagnosis not present

## 2023-10-06 MED ORDER — CLOZAPINE 100 MG PO TABS
150.0000 mg | ORAL_TABLET | Freq: Every day | ORAL | Status: DC
  Administered 2023-10-08 – 2023-10-09 (×2): 150 mg via ORAL
  Filled 2023-10-06 (×4): qty 2

## 2023-10-06 NOTE — Progress Notes (Signed)
[facility] LCSW Progress Note   10/06/2023    3:59 PM  Deanne Coffer      Type of Note: Medicare Notice Reissued since Discharge is 10/07/2023   Patient informed of right to appeal discharge, provided phone number to [facility]. Patient expressed no interest in appealing discharge at this time. CSW will continue to monitor situation.     Signed:   Jacob Moores, MSW, [facility] 10/06/2023 3:59 PM

## 2023-10-06 NOTE — Group Note (Signed)
Recreation Therapy Group Note   Group Topic:Communication  Group Date: 10/06/2023 Start Time: 1030 End Time: 1050 Facilitators: Emmerich Cryer-McCall, LRT,CTRS Location: [address] Dayroom   Group Topic: Communication, Team Building, Problem Solving  Goal Area(s) Addresses:  Patient will effectively work with peer towards shared goal.  Patient will identify skills used to make activity successful.  Patient will identify how skills used during activity can be applied to reach post d/c goals.   Intervention: STEM Activity- Glass blower/designer  Group Description: Tallest Pharmacist, community. In teams of 5-6, patients were given 11 craft pipe cleaners. Using the materials provided, patients were instructed to compete again the opposing team(s) to build the tallest free-standing structure from floor level. The activity was timed; difficulty increased by Clinical research associate as Production designer, theatre/television/film continued.  Systematically resources were removed with additional directions for example, placing one arm behind their back, working in silence, and shape stipulations. LRT facilitated post-activity discussion reviewing team processes and necessary communication skills involved in completion. Patients were encouraged to reflect how the skills utilized, or not utilized, in this activity can be incorporated to positively impact support systems post discharge.  Education: Pharmacist, community, Scientist, physiological, Discharge Planning   Education Outcome: Acknowledges education/In group clarification offered/Needs additional education.   Affect/Mood: N/A   Participation Level: Did not attend    Clinical Observations/Individualized Feedback:     Plan: Continue to engage patient in RT group sessions 2-3x/week.   Jeani Fassnacht-McCall, LRT,CTRS 10/06/2023 1:47 PM

## 2023-10-06 NOTE — Progress Notes (Signed)
   10/05/23 2200  Psych Admission Type (Psych Patients Only)  Admission Status Voluntary  Psychosocial Assessment  Patient Complaints None  Eye Contact Poor  Facial Expression Flat  Affect Appropriate to circumstance  Speech Logical/coherent  Interaction Minimal;Isolative  Motor Activity Fidgety;Tremors;Slow  Appearance/Hygiene Unremarkable (pt reports he will shower before bed)  Behavior Characteristics Appropriate to situation;Cooperative;Calm  Mood Pleasant  Thought Process  Coherency Concrete thinking  Content WDL  Delusions None reported or observed  Perception WDL  Hallucination None reported or observed  Judgment Limited  Confusion None  Danger to Self  Current suicidal ideation? Denies  Danger to Others  Danger to Others None reported or observed

## 2023-10-06 NOTE — Progress Notes (Signed)
   10/06/23 2015  Psych Admission Type (Psych Patients Only)  Admission Status Voluntary  Psychosocial Assessment  Patient Complaints None  Eye Contact Poor  Facial Expression Flat  Affect Appropriate to circumstance  Speech Logical/coherent  Interaction Cautious;Childlike  Motor Activity Fidgety;Slow  Appearance/Hygiene Unremarkable  Behavior Characteristics Cooperative  Mood Preoccupied;Pleasant  Aggressive Behavior  Effect No apparent injury  Thought Process  Coherency Concrete thinking  Content WDL  Delusions WDL  Perception WDL  Hallucination None reported or observed  Judgment Limited  Confusion WDL  Danger to Self  Current suicidal ideation? Denies  Danger to Others  Danger to Others None reported or observed

## 2023-10-06 NOTE — Progress Notes (Signed)
1:1 Nursing note - Patient is asleep no distress noted. 1:1 continues and patient is safe

## 2023-10-06 NOTE — Plan of Care (Addendum)
Problem: Physical Regulation: Goal: Ability to maintain clinical measurements within normal limits will improve Outcome: Progressing   Problem: Safety: Goal: Periods of time without injury will increase Outcome: Progressing   Pt awake in bed conversing with assigned 1:1 staff. Tolerated scheduled evening medications, supper and fluids well. Denies concerns at this time. 1:1 maintained for safety as ordered without incident.

## 2023-10-06 NOTE — Progress Notes (Signed)
1:1 nursing note - Patient is currently asleep with no distress noted. Patient is safe with sitter at bedside ans 1:1 continues

## 2023-10-06 NOTE — Progress Notes (Signed)
[facility] MD Progress Note  10/06/2023 12:19 PM Brady Hoffman  MRN:  [id number]  Principal Problem: Schizoaffective disorder, depressive type (HCC) Diagnosis: Principal Problem:   Schizoaffective disorder, depressive type (HCC)  Reason for Admission:  Brady Hoffman is a 57 year old Caucasian male with prior psychiatric history significant for schizoaffective disorder, seizure disorder, developmental delay, alcohol use disorder, who presents voluntarily [facility] from [facility] health ED at [facility] for evaluation for his mental health illness and frequent incidence of falls. After medical evaluation/stabilization & clearance, he was transferred to the [facility] for further psychiatric evaluation & treatments   (admitted on 09/21/2023, total  LOS: 15 days )  Pertinent information discussed during bed progression:  Slept 9 hours, having compulsions in bathroom but continues to be redirectable per staff report.  PRNs required overnight:  None As needed Ativan and Benadryl were given yesterday morning at 10:00 for episode of akathisia and upper extremity stiffness, responded to medications well.  Information Obtained Today During Patient Interview:  Upon evaluation today patient is lying down in bed reporting feeling fine continues to report stable mood with minimal depression and anxiety, continues to deny passive or active SI intention or plan, denies any worsening AH or VH, does not appear responding to stimuli, no paranoia or other delusions noted.  Patient reports episodes of akathisia happen while he is at the group home and he notes that usually they do not give him any medication for it and it usually goes away on its own within 30 minutes.  I discussed with patient that he is currently on treatment medication Ativan and Benadryl to help with akathisia and EPS/stiffness.  I attempted to contact Mrs. Allen at the group home where patient resides to get more clarification as far as housing manage  akathisia episodes at the facility and if they would be able to provide any as needed, no response at this time. Per collateral information from patient's mother this episodes happen at baseline at the facility and does not seem to be any worsening here.   Past Psychiatric Hx: Previous Psych Diagnoses: Panic disorder with agoraphobia, history of seizures, history of alcohol use disorder, schizoaffective disorder Prior inpatient treatment: Was admitted to [facility] 5 years ago Current/prior outpatient treatment: Yes with Dr. Jannifer Franklin Prior rehab hx: Denies Psychotherapy hx: Yes History of suicide: Denies History of homicide or aggression: Denies Psychiatric medication history: Patient has been on trial Seroquel, Prolixin, Effexor, and Haldol Psychiatric medication compliance history: Compliance Neuromodulation history: Denies Current Psychiatrist: Dr. Jannifer Franklin Current therapist: Dr. Jannifer Franklin   Substance Abuse Hx: Alcohol: Drinks 1-2 beers every 2 weeks Tobacco: Denies Illicit drugs: Denies Rx drug abuse: Denies Rehab hx: Denies   Past Medical History: Medical Diagnoses: Hyperlipidemia, anemia, encephalopathy, low back pain, history of seizures, history of acute renal failure. Home Rx: Yes Prior Hosp: Denies Prior Surgeries/Trauma: Denies Head trauma, LOC, concussions, seizures: History of seizures Allergies: Risperidone And Related   Other (See Comments) Not Specified   11/01/2011  Pt. States head feels like a rock    LMP: Not applicable Contraception: Not applicable PCP: Denies   Family History: Medical: Patient unsure Psych: Patient unsure Psych Rx: Patient unsure SA/HA: Patient unsure Substance use family hx: Patient unsure   Social History: Childhood (bring, raised, lives now, parents, siblings, schooling, education): Some college Abuse: Denies Marital Status: Denies Sexual orientation: Male from birth Children: No children Employment: Unemployed Peer Group: Denies  peer group Housing: Lives at the group home Finances: Some financial difficulty Legal: Denies  Military: Denies serving in the [employer] Past Medical History:  Past Medical History:  Diagnosis Date   Anemia 01/14/2014   Anxiety attack    Chronic kidney disease    Depression    H/O: GI bleed    Hyperlipidemia    Panic disorder with agoraphobia and severe panic attacks    Pervasive developmental disorder    Schizoaffective disorder    Seizures (HCC)    Family History:  Family History  Adopted: Yes    Current Medications: Current Facility-Administered Medications  Medication Dose Route Frequency Provider Last Rate Last Admin   acetaminophen (TYLENOL) tablet 650 mg  650 mg Oral Q6H PRN Dahlia Byes C, NP   650 mg at 10/01/23 0837   alum & mag hydroxide-simeth (MAALOX/MYLANTA) 200-200-20 MG/5ML suspension 30 mL  30 mL Oral Q4H PRN Dahlia Byes C, NP       aspirin chewable tablet 81 mg  81 mg Oral Daily Onuoha, Josephine C, NP   81 mg at 10/06/23 0907   atorvastatin (LIPITOR) tablet 20 mg  20 mg Oral QHS Onuoha, Josephine C, NP   20 mg at 10/05/23 2116   bismuth subsalicylate (PEPTO BISMOL) chewable tablet 524 mg  524 mg Oral Q3H PRN Augusto Gamble, MD       cloZAPine (CLOZARIL) tablet 150 mg  150 mg Oral QHS Carrion-Carrero, Margely, MD   150 mg at 10/05/23 2116   cloZAPine (CLOZARIL) tablet 200 mg  200 mg Oral Daily Rex Kras, MD   200 mg at 10/06/23 0906   diphenhydrAMINE (BENADRYL) capsule 50 mg  50 mg Oral TID PRN Dahlia Byes C, NP   50 mg at 09/27/23 1024   Or   diphenhydrAMINE (BENADRYL) injection 50 mg  50 mg Intramuscular TID PRN Dahlia Byes C, NP   50 mg at 10/05/23 0930   diphenhydrAMINE (BENADRYL) capsule 50 mg  50 mg Oral QHS Geo Slone, MD   50 mg at 10/05/23 2116   docusate sodium (COLACE) capsule 100 mg  100 mg Oral Daily Onuoha, Josephine C, NP   100 mg at 10/06/23 0907   FLUoxetine (PROZAC) capsule 60 mg  60 mg Oral Daily Massengill,  Nathan, MD   60 mg at 10/06/23 0906   influenza vac split trivalent PF (FLULAVAL) injection 0.5 mL  0.5 mL Intramuscular Tomorrow-1000 Massengill, Harrold Donath, MD       levETIRAcetam (KEPPRA) tablet 500 mg  500 mg Oral BID Welford Roche, Josephine C, NP   500 mg at 10/06/23 0907   levothyroxine (SYNTHROID) tablet 50 mcg  50 mcg Oral Q0600 Dahlia Byes C, NP   50 mcg at 10/06/23 0644   LORazepam (ATIVAN) tablet 2 mg  2 mg Oral TID PRN Dahlia Byes C, NP   2 mg at 09/27/23 1023   Or   LORazepam (ATIVAN) injection 2 mg  2 mg Intramuscular TID PRN Dahlia Byes C, NP   2 mg at 10/05/23 0930   LORazepam (ATIVAN) tablet 0.5 mg  0.5 mg Oral Q8H PRN Dahlia Byes C, NP   0.5 mg at 10/01/23 1419   LORazepam (ATIVAN) tablet 1 mg  1 mg Oral BID Abbott Pao, Darcel Frane, MD   1 mg at 10/06/23 1048   mupirocin cream (BACTROBAN) 2 %   Topical Daily Massengill, Harrold Donath, MD   1 Application at 10/06/23 0912   ondansetron (ZOFRAN) tablet 8 mg  8 mg Oral Q8H PRN Augusto Gamble, MD       pantoprazole (PROTONIX) EC tablet 80 mg  80  mg Oral Daily Dahlia Byes C, NP   80 mg at 10/06/23 7829   polyethylene glycol (MIRALAX / GLYCOLAX) packet 17 g  17 g Oral Daily PRN Augusto Gamble, MD       senna Mancel Parsons) tablet 8.6 mg  1 tablet Oral QHS PRN Augusto Gamble, MD        Lab Results:  Results for orders placed or performed during the hospital encounter of 09/21/23 (from the past 48 hour(s))  Urinalysis, Routine w reflex microscopic -Urine, Clean Catch     Status: None   Collection Time: 10/05/23 12:54 AM  Result Value Ref Range   Color, Urine YELLOW YELLOW   APPearance CLEAR CLEAR   Specific Gravity, Urine 1.017 1.005 - 1.030   pH 7.0 5.0 - 8.0   Glucose, UA NEGATIVE NEGATIVE mg/dL   Hgb urine dipstick NEGATIVE NEGATIVE   Bilirubin Urine NEGATIVE NEGATIVE   Ketones, ur NEGATIVE NEGATIVE mg/dL   Protein, ur NEGATIVE NEGATIVE mg/dL   Nitrite NEGATIVE NEGATIVE   Leukocytes,Ua NEGATIVE NEGATIVE    Comment: Performed at  [facility], [address], [place], [place] [address]  Comprehensive metabolic panel     Status: Abnormal   Collection Time: 10/05/23  6:16 PM  Result Value Ref Range   Sodium 140 135 - 145 mmol/L   Potassium 3.9 3.5 - 5.1 mmol/L   Chloride 105 98 - 111 mmol/L   CO2 26 22 - 32 mmol/L   Glucose, Bld 89 70 - 99 mg/dL    Comment: Glucose reference range applies only to samples taken after fasting for at least 8 hours.   BUN 17 6 - 20 mg/dL   Creatinine, Ser 0.86 (H) 0.61 - 1.24 mg/dL   Calcium 8.7 (L) 8.9 - 10.3 mg/dL   Total Protein 7.1 6.5 - 8.1 g/dL   Albumin 3.9 3.5 - 5.0 g/dL   AST 26 15 - 41 U/L   ALT 18 0 - 44 U/L   Alkaline Phosphatase 139 (H) 38 - 126 U/L   Total Bilirubin 0.4 0.3 - 1.2 mg/dL   GFR, Estimated 56 (L) >60 mL/min    Comment: (NOTE) Calculated using the CKD-EPI Creatinine Equation (2021)    Anion gap 9 5 - 15    Comment: Performed at [facility], [address], [place], [place] [address]  CK     Status: Abnormal   Collection Time: 10/05/23  6:16 PM  Result Value Ref Range   Total CK 650 (H) 49 - 397 U/L    Comment: Performed at [facility], [address], [place], [place] [address]  CBC with Differential/Platelet     Status: None   Collection Time: 10/05/23  6:16 PM  Result Value Ref Range   WBC 8.3 4.0 - 10.5 K/uL   RBC 4.24 4.22 - 5.81 MIL/uL   Hemoglobin 13.1 13.0 - 17.0 g/dL   HCT 28.4 13.2 - 44.0 %   MCV 94.8 80.0 - 100.0 fL   MCH 30.9 26.0 - 34.0 pg   MCHC 32.6 30.0 - 36.0 g/dL   RDW 10.2 72.5 - 36.6 %   Platelets 167 150 - 400 K/uL   nRBC 0.0 0.0 - 0.2 %   Neutrophils Relative % 65 %   Neutro Abs 5.3 1.7 - 7.7 K/uL   Lymphocytes Relative 25 %   Lymphs Abs 2.0 0.7 - 4.0 K/uL   Monocytes Relative 9 %   Monocytes Absolute 0.7 0.1 - 1.0  K/uL   Eosinophils Relative 1 %   Eosinophils Absolute 0.1 0.0 - 0.5 K/uL   Basophils Relative 0 %   Basophils Absolute 0.0 0.0 - 0.1 K/uL    Immature Granulocytes 0 %   Abs Immature Granulocytes 0.02 0.00 - 0.07 K/uL    Comment: Performed at [facility], [address], [place], [place] [address]    Blood Alcohol level:  Lab Results  Component Value Date   [facility] <10 09/19/2023   ETH <10 09/28/2019    Metabolic Labs: Lab Results  Component Value Date   HGBA1C 5.7 (H) 09/23/2023   MPG 116.89 09/23/2023   MPG 114 02/25/2014   No results found for: "PROLACTIN" Lab Results  Component Value Date   CHOL 144 07/03/2023   TRIG 152 (H) 07/03/2023   HDL 37 (L) 07/03/2023   CHOLHDL 3.9 07/03/2023   VLDL 28 10/20/2009   LDLCALC 82 07/03/2023   LDLCALC 59 10/02/2021    Physical Findings: AIMS completed  Psychiatric Specialty Exam:  Presentation  General Appearance: Fairly groomed Eye Contact: Fair Speech: Normal rate Speech Volume: Appropriate Handedness:-- (not assessed)   Mood and Affect  Mood:" Fine"  Affect: Flat affect at baseline   Thought Process  Thought Processes:Coherent; Goal Directed; Linear  Descriptions of Associations:Intact  Orientation:grossly intact  Thought Content:WDL  History of Schizophrenia/Schizoaffective disorder:Yes Duration of Psychotic Symptoms:> 6 months Hallucinations:Denies, does not appear responding to stimuli Ideas of Reference:Denies  Suicidal Thoughts: Denies passive or active SI intention or plan Homicidal Thoughts:  Denies  Sensorium  Memory: Oriented to person, place, time Judgment:Fair with some limitations at baseline Insight:Fair with some limitations at baseline  Executive Functions  Concentration:Good Attention Span: Good Recall:Good Fund of Knowledge:Good Language:Good   Psychomotor Activity  Psychomotor Activity: Normal  Assets  Assets:Resilience; Social support  Sleep  Sleep:Fair   Physical Exam: Physical Exam Vitals and nursing note reviewed.  HENT:     Head: Normocephalic and atraumatic.  Pulmonary:      Effort: Pulmonary effort is normal. No respiratory distress.  Skin:    General: Skin is warm and dry.  Neurological:     Mental Status: He is alert and oriented to person, place, and time.     Gait: Gait abnormal.   Using walker   Review of Systems  Constitutional: Negative.   Cardiovascular: Negative.   Psychiatric/Behavioral:  Negative for depression, hallucinations, substance abuse and suicidal ideas. The patient is nervous/anxious.   At baseline   Blood pressure 108/78, pulse 100, temperature (!) 97.5 F (36.4 C), temperature source Oral, resp. rate 16, height 6\' 3"  (1.905 m), weight 94.8 kg, SpO2 98%. Body mass index is 26.12 kg/m.  Treatment Plan Summary: Daily contact with patient to assess and evaluate symptoms and progress in treatment and Medication management   ASSESSMENT: Coal Nearhood is a 57 year old Caucasian male with prior psychiatric history significant for schizoaffective disorder, seizure disorder, developmental delay, alcohol use disorder, who presents voluntarily [facility] from [facility] health ED at [facility] for evaluation for his mental health illness and frequent incidence of falls. After medical evaluation/stabilization & clearance, he was transferred to the [facility] for further psychiatric evaluation & treatments.   Given presentation of on and off episodes of akathisia and upper extremity stiffness/EPS on 11/3 Clozapine was decreased from 200 mg twice daily to 200 mg in the morning and 150 mg at bedtime with plan to lower 250 mg twice daily in 1 to 2 days if does not display  any worsening psychosis.  It was also noted that this episodes of akathisia/stiffness and EPS happened at baseline.  In my opinion patient is stable for discharge after lowering the dose of clozapine to 150 mg twice daily if does not display any worsening psychosis with recommendation to address management of these episodes of akathisia and stiffness/EPS with group home staff.  Patient was  initially planned to be discharged tomorrow morning (Mon 11/4), we will hold on discharging patient for now.  Patient will reach target dose of clozapine 150 mg twice daily on 11/6, recommend to monitor for 1 to 2 days afterward and discharge afterward if no worsening psychosis noted.   PLAN: Safety and Monitoring:              -- Voluntary admission to inpatient psychiatric unit for safety, stabilization and treatment             -- Daily contact with patient to assess and evaluate symptoms and progress in treatment             -- Patient's case to be discussed in multi-disciplinary team meeting             -- Observation Level : 1:1 level of observation continued             -- Vital signs:  q12 hours             -- Precautions: suicide, elopement, and assault   2. Interventions (medications, psychoeducation, etc):  Clozapine regiment adjusted: Clozapine 200 mg daily + 150 mg nightly, with goal of titrating down to 150 mg twice daily to mitigate EPS/akathisia Repeat Ativan 1 mg IM as needed for onset of any new episodes Repeat clozapine level order for 11/4 in AM  To-do: Attempt contacting group home to assess for how patient's akathetic episodes are managed there(per patient's mother this occurs while he is there) Continue Ativan 1 mg p.o. twice daily for akathisia Continue Benadryl 50 mg at bedtime for concern for EPS symptoms  Continue fluoxetine 60 mg daily for depressive symptoms, anxiety   -- removed haloperidol from agitation protocol due to worsening cholinergic symptoms -- medical management: d/c atropine at bedtime for droolingpt now having dry mouth, aspirin, atorvastatin, docusate sodium, levetiracetam, levothyroxine, and pantoprazole -- Patient does not need nicotine replacement   PRN medications for symptomatic management:              -- lorazepam 0.5 mg every 8 hours as needed for anxiety              -- continue acetaminophen 650 mg every 6 hours as needed for mild to  moderate pain, fever, and headaches              -- continue bismuth subsalicylate 524 mg oral chewable tablet every 3 hours as needed for diarrhea / loose stools              -- continue senna 8.6 mg oral at bedtime and polyethylene glycol 17 g oral daily as needed for mild to moderate constipation              -- continue ondansetron 8 mg every 8 hours as needed for nausea or vomiting              -- continue aluminum-magnesium hydroxide + simethicone 30 mL every 4 hours as needed for heartburn or indigestion             -- As needed agitation  protocol in-place   The risks/benefits/side-effects/alternatives to the above medication were discussed in detail with the patient and time was given for questions. The patient consents to medication trial. FDA black box warnings, if present, were discussed.   The patient is agreeable with the medication plan, as above. We will monitor the patient's response to pharmacologic treatment, and adjust medications as necessary.   3. Routine and other pertinent labs: Metabolic profile: UrineBMI: Body mass index is 26.12 kg/m. Lipid Panel: Lipid Panel     Component Value Date/Time   CHOL 144 07/03/2023 1026   TRIG 152 (H) 07/03/2023 1026   HDL 37 (L) 07/03/2023 1026   CHOLHDL 3.9 07/03/2023 1026   VLDL 28 10/20/2009 2054   LDLCALC 82 07/03/2023 1026  HbgA1c: 5.7% TSH: 09/24/2023 2.765   Clozapine on 10/19 is 1723 EKG monitoring: QTc: 479 on 09/27/2023   Clozapine level pending 11/4, 11/20 CMP no significant abnormalities noted, CBC within normal level, CK level elevated 650 probably related to episodes of akathisia and EPS/stiffness.  CK level pending 11/6 to ensure it is trending down  4. Group Therapy:             -- Encouraged patient to participate in unit milieu and in scheduled group therapies              -- Short Term Goals: Ability to identify changes in lifestyle to reduce recurrence of condition, verbalize feelings, identify and develop  effective coping behaviors, maintain clinical measurements within normal limits, and identify triggers associated with substance abuse/mental health issues will improve. Improvement in ability to demonstrate self-control and comply with prescribed medications.             -- Long Term Goals: Improvement in symptoms so as ready for discharge -- Patient is encouraged to participate in group therapy while admitted to the psychiatric unit. -- We will address other chronic and acute stressors, which contributed to the patient's Schizoaffective disorder, depressive type (HCC) in order to reduce the risk of self-harm at discharge.   5. Discharge Planning:              -- Social work and case management to assist with discharge planning and identification of hospital follow-up needs prior to discharge             -- Estimated LOS: 11/7 or 11/8 after lowering clozapine to target dose of 150 mg twice daily ensuring no worsening psychosis, also monitoring CK level to ensure trending down.             -- Discharge Concerns: Need to establish a safety plan; Medication compliance and effectiveness             -- Discharge Goals: Return to the group home with outpatient referrals for mental health follow-up including medication management/psychotherapy  I certify that inpatient services furnished can reasonably be expected to improve the patient's condition.   This note was created using a voice recognition software as a result there may be grammatical errors inadvertently enclosed that do not reflect the nature of this encounter. Every attempt is made to correct such errors.   Dr. Liston Alba, MD PGY-2, Psychiatry Residency  11/4/202412:19 PM

## 2023-10-06 NOTE — Group Note (Signed)
Date:  10/06/2023 Time:  9:25 PM  Group Topic/Focus:  Wrap-Up Group:   The focus of this group is to help patients review their daily goal of treatment and discuss progress on daily workbooks.    Participation Level:  None  Participation Quality:   none  Affect:  Flat  Cognitive:  Lacking  Insight: Lacking  Engagement in Group:  None  Modes of Intervention:   None  Additional Comments:  Patient attended group but did not participate.  Lita Mains [facility] 10/06/2023, 9:25 PM

## 2023-10-06 NOTE — BH IP Treatment Plan (Signed)
Interdisciplinary Treatment and Diagnostic Plan Update  10/06/2023 Time of Session: 12:05 PM - UPDATE Brady Hoffman MRN: [id number]  Principal Diagnosis: Schizoaffective disorder, depressive type (HCC)  Secondary Diagnoses: Principal Problem:   Schizoaffective disorder, depressive type (HCC)   Current Medications:  Current Facility-Administered Medications  Medication Dose Route Frequency Provider Last Rate Last Admin   acetaminophen (TYLENOL) tablet 650 mg  650 mg Oral Q6H PRN Dahlia Byes C, NP   650 mg at 10/01/23 0837   alum & mag hydroxide-simeth (MAALOX/MYLANTA) 200-200-20 MG/5ML suspension 30 mL  30 mL Oral Q4H PRN Dahlia Byes C, NP       aspirin chewable tablet 81 mg  81 mg Oral Daily Onuoha, Josephine C, NP   81 mg at 10/06/23 4098   atorvastatin (LIPITOR) tablet 20 mg  20 mg Oral QHS Onuoha, Josephine C, NP   20 mg at 10/05/23 2116   bismuth subsalicylate (PEPTO BISMOL) chewable tablet 524 mg  524 mg Oral Q3H PRN Augusto Gamble, MD       cloZAPine (CLOZARIL) tablet 150 mg  150 mg Oral QHS Carrion-Carrero, Margely, MD   150 mg at 10/05/23 2116   [START ON 10/08/2023] cloZAPine (CLOZARIL) tablet 150 mg  150 mg Oral Daily Attiah, Nadir, MD       diphenhydrAMINE (BENADRYL) capsule 50 mg  50 mg Oral TID PRN Dahlia Byes C, NP   50 mg at 09/27/23 1024   Or   diphenhydrAMINE (BENADRYL) injection 50 mg  50 mg Intramuscular TID PRN Dahlia Byes C, NP   50 mg at 10/05/23 0930   diphenhydrAMINE (BENADRYL) capsule 50 mg  50 mg Oral QHS Attiah, Nadir, MD   50 mg at 10/05/23 2116   docusate sodium (COLACE) capsule 100 mg  100 mg Oral Daily Onuoha, Josephine C, NP   100 mg at 10/06/23 0907   FLUoxetine (PROZAC) capsule 60 mg  60 mg Oral Daily Massengill, Nathan, MD   60 mg at 10/06/23 0906   influenza vac split trivalent PF (FLULAVAL) injection 0.5 mL  0.5 mL Intramuscular Tomorrow-1000 Massengill, Harrold Donath, MD       levETIRAcetam (KEPPRA) tablet 500 mg  500 mg Oral BID  Welford Roche, Josephine C, NP   500 mg at 10/06/23 0907   levothyroxine (SYNTHROID) tablet 50 mcg  50 mcg Oral Q0600 Dahlia Byes C, NP   50 mcg at 10/06/23 0644   LORazepam (ATIVAN) tablet 2 mg  2 mg Oral TID PRN Dahlia Byes C, NP   2 mg at 09/27/23 1023   Or   LORazepam (ATIVAN) injection 2 mg  2 mg Intramuscular TID PRN Dahlia Byes C, NP   2 mg at 10/05/23 0930   LORazepam (ATIVAN) tablet 0.5 mg  0.5 mg Oral Q8H PRN Dahlia Byes C, NP   0.5 mg at 10/01/23 1419   LORazepam (ATIVAN) tablet 1 mg  1 mg Oral BID Abbott Pao, Nadir, MD   1 mg at 10/06/23 1048   mupirocin cream (BACTROBAN) 2 %   Topical Daily Massengill, Harrold Donath, MD   1 Application at 10/06/23 0912   ondansetron (ZOFRAN) tablet 8 mg  8 mg Oral Q8H PRN Augusto Gamble, MD       pantoprazole (PROTONIX) EC tablet 80 mg  80 mg Oral Daily Onuoha, Josephine C, NP   80 mg at 10/06/23 0906   polyethylene glycol (MIRALAX / GLYCOLAX) packet 17 g  17 g Oral Daily PRN Augusto Gamble, MD       senna (SENOKOT) tablet 8.6  mg  1 tablet Oral QHS PRN Augusto Gamble, MD       PTA Medications: Medications Prior to Admission  Medication Sig Dispense Refill Last Dose   acetaminophen (TYLENOL) 500 MG tablet Take 500-1,000 mg by mouth every 4 (four) hours as needed (pain).      aspirin 81 MG chewable tablet Chew 1 tablet (81 mg total) by mouth daily.      atorvastatin (LIPITOR) 20 MG tablet Take 1 tablet (20 mg total) by mouth at bedtime. 30 tablet 1    clonazePAM (KLONOPIN) 0.5 MG tablet Take 1 tablet (0.5 mg total) by mouth at bedtime. (Patient not taking: Reported on 09/20/2023) 30 tablet 0    cloZAPine (CLOZARIL) 100 MG tablet Take 1 tablet (100 mg total) by mouth 2 (two) times daily. (Patient taking differently: Take 300 mg by mouth See admin instructions. Take 300mg  in the morning and 300mg  at bedtime) 60 tablet 0    docusate sodium (COLACE) 100 MG capsule Take 100 mg by mouth daily.       FLUoxetine (PROZAC) 40 MG capsule Take 40 mg by mouth 2  (two) times daily.      folic acid (FOLVITE) 400 MCG tablet Take 1,000 mcg by mouth daily.      levETIRAcetam (KEPPRA) 500 MG tablet Take 500 mg by mouth 2 (two) times daily.      levothyroxine (SYNTHROID, LEVOTHROID) 50 MCG tablet Take 1 tablet (50 mcg total) by mouth daily before breakfast. For underactive thyroid 30 tablet 0    LORazepam (ATIVAN) 1 MG tablet Take 1 mg by mouth 2 (two) times daily as needed.      omeprazole (PRILOSEC) 40 MG capsule Take 1 capsule (40 mg total) by mouth daily. 30 capsule 1    QUEtiapine (SEROQUEL) 100 MG tablet Take 100 mg by mouth at bedtime. (Patient not taking: Reported on 09/20/2023)       Patient Stressors: Medication change or noncompliance    Patient Strengths: Supportive family/friends   Treatment Modalities: Medication Management, Group therapy, Case management,  1 to 1 session with clinician, Psychoeducation, Recreational therapy.   Physician Treatment Plan for Primary Diagnosis: Schizoaffective disorder, depressive type (HCC) Long Term Goal(s): Improvement in symptoms so as ready for discharge   Short Term Goals: Ability to identify changes in lifestyle to reduce recurrence of condition will improve Ability to verbalize feelings will improve Ability to disclose and discuss suicidal ideas Ability to demonstrate self-control will improve Ability to identify and develop effective coping behaviors will improve Ability to maintain clinical measurements within normal limits will improve Compliance with prescribed medications will improve Ability to identify triggers associated with substance abuse/mental health issues will improve  Medication Management: Evaluate patient's response, side effects, and tolerance of medication regimen.  Therapeutic Interventions: 1 to 1 sessions, Unit Group sessions and Medication administration.  Evaluation of Outcomes: Progressing  Physician Treatment Plan for Secondary Diagnosis: Principal Problem:    Schizoaffective disorder, depressive type (HCC)  Long Term Goal(s): Improvement in symptoms so as ready for discharge   Short Term Goals: Ability to identify changes in lifestyle to reduce recurrence of condition will improve Ability to verbalize feelings will improve Ability to disclose and discuss suicidal ideas Ability to demonstrate self-control will improve Ability to identify and develop effective coping behaviors will improve Ability to maintain clinical measurements within normal limits will improve Compliance with prescribed medications will improve Ability to identify triggers associated with substance abuse/mental health issues will improve     Medication  Management: Evaluate patient's response, side effects, and tolerance of medication regimen.  Therapeutic Interventions: 1 to 1 sessions, Unit Group sessions and Medication administration.  Evaluation of Outcomes: Progressing   RN Treatment Plan for Primary Diagnosis: Schizoaffective disorder, depressive type (HCC) Long Term Goal(s): Knowledge of disease and therapeutic regimen to maintain health will improve  Short Term Goals: Ability to remain free from injury will improve, Ability to verbalize frustration and anger appropriately will improve, Ability to participate in decision making will improve, Ability to verbalize feelings will improve, Ability to identify and develop effective coping behaviors will improve, and Compliance with prescribed medications will improve  Medication Management: RN will administer medications as ordered by provider, will assess and evaluate patient's response and provide education to patient for prescribed medication. RN will report any adverse and/or side effects to prescribing provider.  Therapeutic Interventions: 1 on 1 counseling sessions, Psychoeducation, Medication administration, Evaluate responses to treatment, Monitor vital signs and CBGs as ordered, Perform/monitor CIWA, COWS, AIMS and  Fall Risk screenings as ordered, Perform wound care treatments as ordered.  Evaluation of Outcomes: Progressing   LCSW Treatment Plan for Primary Diagnosis: Schizoaffective disorder, depressive type (HCC) Long Term Goal(s): Safe transition to appropriate next level of care at discharge, Engage patient in therapeutic group addressing interpersonal concerns.  Short Term Goals: Engage patient in aftercare planning with referrals and resources, Increase social support, Increase emotional regulation, Facilitate acceptance of mental health diagnosis and concerns, Identify triggers associated with mental health/substance abuse issues, and Increase skills for wellness and recovery  Therapeutic Interventions: Assess for all discharge needs, 1 to 1 time with Social worker, Explore available resources and support systems, Assess for adequacy in community support network, Educate family and significant other(s) on suicide prevention, Complete Psychosocial Assessment, Interpersonal group therapy.  Evaluation of Outcomes: Progressing   Progress in Treatment: Attending groups: No. Participating in groups: No. Taking medication as prescribed: Yes. Toleration medication: Yes. Family/Significant other contact made: Yes mom [name] and Roena Malady ( group home )  Patient understands diagnosis: No. Discussing patient identified problems/goals with staff: Yes. Medical problems stabilized or resolved: Yes. Denies suicidal/homicidal ideation: Yes. Issues/concerns per patient self-inventory: No.     New problem(s) identified: No, Describe:  none reported   New Short Term/Long Term Goal(s): medication stabilization, elimination of SI thoughts, development of comprehensive mental wellness plan.      Patient Goals:  "Get my medications right"   Discharge Plan or Barriers: Patient recently admitted. CSW will continue to follow and assess for appropriate referrals and possible discharge planning.      Reason  for Continuation of Hospitalization: Medication stabilization   Estimated Length of Stay: 1-2 DAYS   Last 3 [place] Suicide Severity Risk Score: Flowsheet Row Ad[facility] (Current) [facility] from 09/19/2023 in [facility]  C-SSRS RISK CATEGORY No Risk No Risk       Last PHQ 2/9 Scores:     No data to display          Scribe for Treatment Team: Kathi Der, Theresia Majors 10/06/2023 3:16 PM

## 2023-10-06 NOTE — Progress Notes (Signed)
Pt awake in bed, refused to attend scheduled CSW group despite multiple prompts "No, I'm ok". Tolerated lunch and fluids well. Affect congruent when engaged. Visited with mom earlier this shift. Attended orientation group in dayroom with peers. 1:1 observation maintained without incident; assigned staff in attendance at all times.

## 2023-10-06 NOTE — Progress Notes (Signed)
Pt awake, visible at medication window on initial contact. Presents with brief eye contact, flat affect but does brightens up and forwards in conversation when engaged. A & O to self, place, situation and time. Denies SI, HI, AVH and pain when assessed. Reported he slept well last night with good appetite "but I had a panic attack yesterday" when asked about his weekend. Encouraged to sit in dayroom for orientation group post morning medications and he was cooperative. Remains medication compliant, denies adverse drug reactions / concerns with treatment when assessed by Clinical research associate.  1:1 observation maintained without incident; assigned staff in attendance at all times.

## 2023-10-06 NOTE — Plan of Care (Signed)
  Problem: Education: Goal: Emotional status will improve Outcome: Progressing   Problem: Safety: Goal: Periods of time without injury will increase Outcome: Progressing   

## 2023-10-06 NOTE — Plan of Care (Signed)
  Problem: Education: Goal: Emotional status will improve Outcome: Progressing   Problem: Activity: Goal: Interest or engagement in activities will improve Outcome: Progressing Goal: Sleeping patterns will improve Outcome: Progressing   Problem: Coping: Goal: Ability to demonstrate self-control will improve Outcome: Progressing

## 2023-10-06 NOTE — Group Note (Signed)
LCSW Group Therapy Note   Group Date: 10/06/2023 Start Time: 1325 End Time: 1425   Type of Therapy and Topic:  Group Therapy: Goal Planning   Participation Level:  Did Not Attend    Description of Group: In this process group, patients discussed using strengths to work toward goals and address challenges.  Patients identified two positive things about themselves and one goal they were working on.  Patients were given the opportunity to share openly and support each other's plan for self-empowerment.  The group discussed the value of gratitude and were encouraged to have a daily reflection of positive characteristics or circumstances.  Patients were encouraged to identify a plan to utilize their strengths to work on current challenges and goals.   Therapeutic Goals Patient will verbalize personal strengths/positive qualities and relate how these can assist with achieving desired personal goals Patients will verbalize affirmation of peers plans for personal change and goal setting Patients will explore the value of gratitude and positive focus as related to successful achievement of goals Patients will verbalize a plan for regular reinforcement of personal positive qualities and circumstances.   Summary of Patient Progress: Did Not Attend      Therapeutic Modalities Cognitive Behavioral Therapy Motivational Interviewing       Beather Arbour 10/06/2023  3:51 PM

## 2023-10-07 DIAGNOSIS — F251 Schizoaffective disorder, depressive type: Secondary | ICD-10-CM | POA: Diagnosis not present

## 2023-10-07 MED ORDER — FLUOXETINE HCL 20 MG PO CAPS
40.0000 mg | ORAL_CAPSULE | Freq: Every day | ORAL | Status: DC
  Administered 2023-10-08 – 2023-10-16 (×9): 40 mg via ORAL
  Filled 2023-10-07 (×11): qty 2

## 2023-10-07 MED ORDER — AMANTADINE HCL 100 MG PO CAPS
100.0000 mg | ORAL_CAPSULE | Freq: Every day | ORAL | Status: DC
  Administered 2023-10-07 – 2023-10-16 (×10): 100 mg via ORAL
  Filled 2023-10-07 (×12): qty 1

## 2023-10-07 NOTE — Plan of Care (Signed)
  Problem: Education: Goal: Emotional status will improve Outcome: Progressing Goal: Mental status will improve Outcome: Progressing   Problem: Activity: Goal: Sleeping patterns will improve Outcome: Progressing   Problem: Activity: Goal: Interest or engagement in activities will improve Outcome: Not Progressing

## 2023-10-07 NOTE — Progress Notes (Signed)
Nursing 1:1 note D:Pt observed sleeping in bed with eyes closed. RR even and unlabored. No distress noted. A: 1:1 observation continues for safety  R: pt remains safe  

## 2023-10-07 NOTE — B[facility] Group Notes (Signed)
Pt did not attend wrap-up group   

## 2023-10-07 NOTE — Progress Notes (Signed)
[facility] MD Progress Note  10/07/2023 1:30 PM Brady Hoffman  MRN:  [id number] Subjective:  attempted to see Brady Hoffman today in the morning, but he was mostly in the bathroom. He was seen later at lunch. He has ongoing tremors, restlessness and repetitive behaviors. He had "bad dreams" overnight but otherwise slept okay. He is eating okay. He feels that he is doing "not too well" today. He adds that he has "negative" thoughts and "I want to get out of this life" overall. He denies current hallucinations. He does not have thoughts of harm to others.   Principal Problem: Schizoaffective disorder, depressive type (HCC) Diagnosis: Principal Problem:   Schizoaffective disorder, depressive type (HCC)  Total Time spent with patient: 20 minutes  Past Psychiatric History:  Previous Psych Diagnoses: Panic disorder with agoraphobia, history of seizures, history of alcohol use disorder, schizoaffective disorder Prior inpatient treatment: Was admitted to [facility] 5 years ago Current/prior outpatient treatment: Yes with Dr. Jannifer Franklin Prior rehab hx: Denies Psychotherapy hx: Yes History of suicide: Denies History of homicide or aggression: Denies Psychiatric medication history: Patient has been on trial Seroquel, Prolixin, Effexor, and Haldol Psychiatric medication compliance history: Compliance Neuromodulation history: Denies Current Psychiatrist: Dr. Jannifer Franklin Current therapist: Dr. 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    Pervasive developmental disorder    Schizoaffective disorder    Seizures (HCC)     Past Surgical History:  Procedure Laterality Date   ESOPHAGOGASTRODUODENOSCOPY  11/01/2011   Procedure: ESOPHAGOGASTRODUODENOSCOPY (EGD);  Surgeon: Theda Belfast;  Location: [facility] ENDOSCOPY;  Service:  Endoscopy;  Laterality: N/A;   MOLE REMOVAL     WISDOM TOOTH EXTRACTION     Family History:  Family History  Adopted: Yes   Family Psychiatric  History:  Medical: Patient unsure Psych: Patient unsure Psych Rx: Patient unsure SA/HA: Patient unsure Substance use family hx: Patient unsure Social History:  Social History   Substance and Sexual Activity  Alcohol Use No     Social History   Substance and Sexual Activity  Drug Use No    Social History   Socioeconomic History   Marital status: Single    Spouse name: Not on file   Number of children: 0   Years of education: College   Highest education level: Not on file  Occupational History    Employer: OTHER  Tobacco Use   Smoking status: Never   Smokeless tobacco: Never  Substance and Sexual Activity   Alcohol use: No   Drug use: No   Sexual activity: Never  Other Topics Concern   Not on file  Social History Narrative   Patient lives in [facility].   Right handed.   Education  Two years of college.Caffeine Use: 2 cups daily  Coke cola.    Social Determinants of Health   Financial Resource Strain: Not on file  Food Insecurity: No Food Insecurity (09/21/2023)   Hunger Vital Sign    Worried About Running Out of Food in the Last Year: Never true    Ran Out of Food in the Last Year: Never true  Transportation Needs: No Transportation Needs (09/21/2023)   PRAPARE - Administrator, Civil Service (Medical): No    Lack of Transportation (Non-Medical): No  Physical Activity: Not on file  Stress: Not on file  Social Connections: Not on file   Additional Social History:   Childhood (bring, raised, lives now, parents, siblings, schooling, education): Some college Abuse: Denies Marital Status: Denies Sexual orientation: Male from birth Children: No children Employment: Unemployed Peer Group: Denies peer group Housing: Lives at the group home Finances: Some financial difficulty Legal:  Denies 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 [employer]    Sleep:  +nightmares   Appetite:  Fair  Current Medications: Current Facility-Administered Medications  Medication Dose Route Frequency Provider Last Rate Last Admin   acetaminophen (TYLENOL) tablet 650 mg  650 mg Oral Q6H PRN Dahlia Byes C, NP   650 mg at 10/01/23 0837   alum & mag hydroxide-simeth (MAALOX/MYLANTA) 200-200-20 MG/5ML suspension 30 mL  30 mL Oral Q4H PRN Dahlia Byes C, NP       amantadine (SYMMETREL) capsule 100 mg  100 mg Oral Daily Ronnae Kaser, Shelbie Hutching, MD       aspirin chewable tablet 81 mg  81 mg Oral Daily Onuoha, Josephine C, NP   81 mg at 10/07/23 0904   atorvastatin (LIPITOR) tablet 20 mg  20 mg Oral QHS Onuoha, Josephine C, NP   20 mg at 10/06/23 2044   bismuth subsalicylate (PEPTO BISMOL) chewable tablet 524 mg  524 mg Oral Q3H PRN Augusto Gamble, MD       cloZAPine (CLOZARIL) tablet 150 mg  150 mg Oral QHS Carrion-Carrero, Margely, MD   150 mg at 10/06/23 2043   [START ON 10/08/2023] cloZAPine (CLOZARIL) tablet 150 mg  150 mg Oral Daily Attiah, Nadir, MD       diphenhydrAMINE (BENADRYL) capsule 50 mg  50 mg Oral TID PRN Dahlia Byes C, NP   50 mg at 09/27/23 1024   Or   diphenhydrAMINE (BENADRYL) injection 50 mg  50 mg Intramuscular TID PRN Dahlia Byes C, NP   50 mg at 10/07/23 1255   diphenhydrAMINE (BENADRYL) capsule 50 mg  50 mg Oral QHS Attiah, Nadir, MD   50 mg at 10/06/23 2044   docusate sodium (COLACE) capsule 100 mg  100 mg Oral Daily Onuoha, Josephine C, NP   100 mg at 10/07/23 0903   [START ON 10/08/2023] FLUoxetine (PROZAC) capsule 40 mg  40 mg Oral Daily Whitnee Orzel, Shelbie Hutching, MD       influenza vac split trivalent PF (FLULAVAL) injection 0.5 mL  0.5 mL Intramuscular Tomorrow-1000 Massengill, Nathan, MD       levETIRAcetam (KEPPRA) tablet 500 mg  500 mg Oral BID Welford Roche, Josephine C, NP   500 mg at 10/07/23 0903   levothyroxine (SYNTHROID) tablet 50 mcg  50 mcg Oral Q0600 Dahlia Byes C, NP   50 mcg at 10/07/23 8469   LORazepam (ATIVAN) tablet 2 mg  2 mg Oral TID PRN Dahlia Byes C, NP   2 mg at 09/27/23 1023   Or   LORazepam (ATIVAN) injection 2 mg  2 mg Intramuscular TID PRN Dahlia Byes C, NP   2 mg at 10/05/23 0930   LORazepam (ATIVAN) tablet 0.5 mg  0.5 mg Oral Q8H PRN Dahlia Byes C, NP   0.5 mg at 10/01/23 1419   LORazepam (ATIVAN) tablet 1 mg  1 mg Oral BID Abbott Pao, Nadir, MD   1 mg at 10/07/23 0903   mupirocin cream (BACTROBAN) 2 %   Topical Daily Phineas Inches, MD   Given at 10/07/23 0904   ondansetron (ZOFRAN) tablet 8 mg  8 mg  Oral Q8H PRN Augusto Gamble, MD       pantoprazole (PROTONIX) EC tablet 80 mg  80 mg Oral Daily Dahlia Byes C, NP   80 mg at 10/07/23 0904   polyethylene glycol (MIRALAX / GLYCOLAX) packet 17 g  17 g Oral Daily PRN Augusto Gamble, MD       senna Mancel Parsons) tablet 8.6 mg  1 tablet Oral QHS PRN Augusto Gamble, MD        Lab Results:  Results for orders placed or performed during the hospital encounter of 09/21/23 (from the past 48 hour(s))  Comprehensive metabolic panel     Status: Abnormal   Collection Time: 10/05/23  6:16 PM  Result Value Ref Range   Sodium 140 135 - 145 mmol/L   Potassium 3.9 3.5 - 5.1 mmol/L   Chloride 105 98 - 111 mmol/L   CO2 26 22 - 32 mmol/L   Glucose, Bld 89 70 - 99 mg/dL    Comment: Glucose reference range applies only to samples taken after fasting for at least 8 hours.   BUN 17 6 - 20 mg/dL   Creatinine, Ser 1.61 (H) 0.61 - 1.24 mg/dL   Calcium 8.7 (L) 8.9 - 10.3 mg/dL   Total Protein 7.1 6.5 - 8.1 g/dL   Albumin 3.9 3.5 - 5.0 g/dL   AST 26 15 - 41 U/L   ALT 18 0 - 44 U/L   Alkaline Phosphatase 139 (H) 38 - 126 U/L   Total Bilirubin 0.4 0.3 - 1.2 mg/dL   GFR, Estimated 56 (L) >60 mL/min    Comment: (NOTE) Calculated using the CKD-EPI Creatinine Equation (2021)    Anion gap 9 5 - 15    Comment: Performed at [facility], [address], [place],  [place] [address]  CK     Status: Abnormal   Collection Time: 10/05/23  6:16 PM  Result Value Ref Range   Total CK 650 (H) 49 - 397 U/L    Comment: Performed at [facility], [address] [place], [place] [address]  CBC with Differential/Platelet     Status: None   Collection Time: 10/05/23  6:16 PM  Result Value Ref Range   WBC 8.3 4.0 - 10.5 K/uL   RBC 4.24 4.22 - 5.81 MIL/uL   Hemoglobin 13.1 13.0 - 17.0 g/dL   HCT 11.9 14.7 - 82.9 %   MCV 94.8 80.0 - 100.0 fL   MCH 30.9 26.0 - 34.0 pg   MCHC 32.6 30.0 - 36.0 g/dL   RDW 56.2 13.0 - 86.5 %   Platelets 167 150 - 400 K/uL   nRBC 0.0 0.0 - 0.2 %   Neutrophils Relative % 65 %   Neutro Abs 5.3 1.7 - 7.7 K/uL   Lymphocytes Relative 25 %   Lymphs Abs 2.0 0.7 - 4.0 K/uL   Monocytes Relative 9 %   Monocytes Absolute 0.7 0.1 - 1.0 K/uL   Eosinophils Relative 1 %   Eosinophils Absolute 0.1 0.0 - 0.5 K/uL   Basophils Relative 0 %   Basophils Absolute 0.0 0.0 - 0.1 K/uL   Immature Granulocytes 0 %   Abs Immature Granulocytes 0.02 0.00 - 0.07 K/uL    Comment: Performed at [facility], [address], [place], [place] [address]    Blood Alcohol level:  Lab Results  Component Value Date   [facility] <10 09/19/2023   ETH <10 09/28/2019    Metabolic Disorder Labs: Lab Results  Component Value Date   HGBA1C 5.7 (H) 09/23/2023   MPG 116.89 09/23/2023   MPG 114 02/25/2014   No results found for: "PROLACTIN" Lab Results  Component Value Date   CHOL 144 07/03/2023   TRIG 152 (H) 07/03/2023   HDL 37 (L) 07/03/2023   CHOLHDL 3.9 07/03/2023   VLDL 28 10/20/2009   LDLCALC 82 07/03/2023   LDLCALC 59 10/02/2021    Physical Findings: AIMS: Facial and Oral Movements Muscles of Facial Expression: Mild Lips and Perioral Area: Minimal, may be extreme normal Jaw: Minimal, may be extreme normal Tongue: Minimal, may be extreme normal,Extremity Movements Upper (arms, wrists, hands, fingers): Mild Lower (legs,  knees, ankles, toes): Mild, Trunk Movements Neck, shoulders, hips: Moderate, Global Judgements Severity of abnormal movements overall : Moderate Incapacitation due to abnormal movements: Moderate Patient's awareness of abnormal movements: Aware, moderate distress, Dental Status Current problems with teeth and/or dentures?: No Does patient usually wear dentures?: No Edentia?: No  CIWA:    COWS:     Musculoskeletal: Strength & Muscle Tone: within normal limits Gait & Station: normal Patient leans: N/A  Psychiatric Specialty Exam:  Presentation  General Appearance:  Disheveled  Eye Contact: Fleeting  Speech: Blocked  Speech Volume: Normal  Handedness: -- (not assessed)   Mood and Affect  Mood: Anxious; Dysphoric  Affect: Flat   Thought Process  Thought Processes: Disorganized  Descriptions of Associations:Loose  Orientation:Partial  Thought Content:Obsessions; Illogical  History of Schizophrenia/Schizoaffective disorder:No data recorded Duration of Psychotic Symptoms:No data recorded Hallucinations:Hallucinations: None  Ideas of Reference:None  Suicidal Thoughts:Suicidal Thoughts: Yes, Passive SI Passive Intent and/or Plan: Without Plan  Homicidal Thoughts:Homicidal Thoughts: No   Sensorium  Memory: Immediate Poor; Recent Poor  Judgment: Poor  Insight: Poor   Executive Functions  Concentration: Poor  Attention Span: Poor  Recall: Poor  Fund of Knowledge: Poor  Language: Fair   Psychomotor Activity  Psychomotor Activity: Psychomotor Activity: Increased; Mannerisms; Restlessness Extrapyramidal Side Effects (EPS): Parkinsonism   Assets  Assets: Desire for Improvement   Sleep  Sleep: Sleep: Poor    Physical Exam: Physical Exam Vitals and nursing note reviewed.  HENT:     Head: Normocephalic.  Eyes:     Extraocular Movements: Extraocular movements intact.  Pulmonary:     Effort: Pulmonary effort is normal.   Musculoskeletal:        General: Normal range of motion.  Neurological:     General: No focal deficit present.     Mental Status: He is alert.  Psychiatric:        Mood and Affect: Mood is anxious.    Review of Systems  Constitutional:  Negative for fever.  Gastrointestinal:  Negative for nausea and vomiting.  Neurological:  Positive for tremors.  Psychiatric/Behavioral:  Positive for suicidal ideas.    Blood pressure 116/89, pulse 99, temperature 98.2 F (36.8 C), temperature source Oral, resp. rate 16, height 6\' 3"  (1.905 m), weight 94.8 kg, SpO2 98%. Body mass index is 26.12 kg/m.   Treatment Plan Summary:   Treatment Plan Summary: Daily contact with patient to assess and evaluate symptoms and progress in treatment and Medication management     ASSESSMENT: Aleksander Edmiston is a 57 year old Caucasian male with prior psychiatric history significant for schizoaffective disorder, seizure disorder, developmental delay, alcohol use disorder, who presents voluntarily [facility] from [facility] health ED at [facility] for evaluation for his mental health illness and frequent incidence of falls. After medical evaluation/stabilization & clearance, he was transferred to the  [facility] for further psychiatric evaluation & treatments.   Given presentation of on and off episodes of akathisia and upper extremity stiffness/EPS on 11/3 Clozapine was decreased from 200 mg twice daily to 200 mg in the morning and 150 mg at bedtime with plan to lower 250 mg twice daily in 1 to 2 days if does not display any worsening psychosis.   It was also noted that this episodes of akathisia/stiffness and EPS happened at baseline.   In my opinion patient is stable for discharge after lowering the dose of clozapine to 150 mg twice daily if does not display any worsening psychosis with recommendation to address management of these episodes of akathisia and stiffness/EPS with group home staff.   Patient was initially planned  to be discharged tomorrow morning (Mon 11/4), we will hold on discharging patient for now.  Patient will reach target dose of clozapine 150 mg twice daily on 11/6, recommend to monitor for 1 to 2 days afterward and discharge afterward if no worsening psychosis noted.     PLAN: Safety and Monitoring:              -- Voluntary admission to inpatient psychiatric unit for safety, stabilization and treatment             -- Daily contact with patient to assess and evaluate symptoms and progress in treatment             -- Patient's case to be discussed in multi-disciplinary team meeting             -- Observation Level : 1:1 level of observation continued             -- Vital signs:  q12 hours             -- Precautions: suicide, elopement, and assault   2. Interventions (medications, psychoeducation, etc):  Clozapine regiment adjusted: Clozapine 150mg  PO BID Repeat Ativan 1 mg IM as needed for onset of any new episodes Repeat clozapine level order for 11/4 in AM  To-do: Attempt contacting group home to assess for how patient's akathetic episodes are managed there(per patient's mother this occurs while he is there) Continue Ativan 1 mg p.o. twice daily for akathisia Continue Benadryl 50 mg at bedtime for concern for EPS symptoms  reduce fluoxetine 40 mg daily - this medication has been associated with restlessness at high doses Added amantadine 100mg  PO daily for the tremor     -- removed haloperidol from agitation protocol due to worsening cholinergic symptoms -- medical management: d/c atropine at bedtime for droolingpt now having dry mouth, aspirin, atorvastatin, docusate sodium, levetiracetam, levothyroxine, and pantoprazole -- Patient does not need nicotine replacement   PRN medications for symptomatic management:              -- lorazepam 0.5 mg every 8 hours as needed for anxiety              -- continue acetaminophen 650 mg every 6 hours as needed for mild to moderate pain, fever, and  headaches              -- continue bismuth subsalicylate 524 mg oral chewable tablet every 3 hours as needed for diarrhea / loose stools              -- continue senna 8.6 mg oral at bedtime and polyethylene glycol 17 g oral daily as needed for mild to moderate constipation              --  continue ondansetron 8 mg every 8 hours as needed for nausea or vomiting              -- continue aluminum-magnesium hydroxide + simethicone 30 mL every 4 hours as needed for heartburn or indigestion             -- As needed agitation protocol in-place   The risks/benefits/side-effects/alternatives to the above medication were discussed in detail with the patient and time was given for questions. The patient consents to medication trial. FDA black box warnings, if present, were discussed.   The patient is agreeable with the medication plan, as above. We will monitor the patient's response to pharmacologic treatment, and adjust medications as necessary.   3. Routine and other pertinent labs: Metabolic profile: UrineBMI: Body mass index is 26.12 kg/m. Clozapine on 10/19 is 1723 EKG monitoring: QTc: 479 on 09/27/2023   Clozapine level pending 11/4   CK level pending 11/6 to ensure it is trending down   4. Group Therapy:             -- Encouraged patient to participate in unit milieu and in scheduled group therapies              -- Short Term Goals: Ability to identify changes in lifestyle to reduce recurrence of condition, verbalize feelings, identify and develop effective coping behaviors, maintain clinical measurements within normal limits, and identify triggers associated with substance abuse/mental health issues will improve. Improvement in ability to demonstrate self-control and comply with prescribed medications.             -- Long Term Goals: Improvement in symptoms so as ready for discharge -- Patient is encouraged to participate in group therapy while admitted to the psychiatric unit. -- We will  address other chronic and acute stressors, which contributed to the patient's Schizoaffective disorder, depressive type (HCC) in order to reduce the risk of self-harm at discharge.   5. Discharge Planning:              -- Social work and case management to assist with discharge planning and identification of hospital follow-up needs prior to discharge             -- Estimated LOS: 11/7 or 11/8 after lowering clozapine to target dose of 150 mg twice daily ensuring no worsening psychosis, also monitoring CK level to ensure trending down.             -- Discharge Concerns: Need to establish a safety plan; Medication compliance and effectiveness             -- Discharge Goals: Return to the group home with outpatient referrals for mental health follow-up including medication management/psychotherapy   I certify that inpatient services furnished can reasonably be expected to improve the patient's condition.    Roselle Locus, MD 10/07/2023, 1:30 PM

## 2023-10-07 NOTE — Progress Notes (Signed)
   10/07/23 2145  Psych Admission Type (Psych Patients Only)  Admission Status Voluntary  Psychosocial Assessment  Patient Complaints None  Eye Contact Poor  Facial Expression Flat  Affect Appropriate to circumstance  Speech Logical/coherent  Interaction Cautious;Childlike  Motor Activity Fidgety;Slow  Appearance/Hygiene Unremarkable  Behavior Characteristics Cooperative  Mood Suspicious;Preoccupied  Aggressive Behavior  Effect No apparent injury  Thought Process  Coherency Concrete thinking  Content WDL  Delusions WDL  Perception WDL  Hallucination None reported or observed  Judgment Limited  Confusion WDL  Danger to Self  Current suicidal ideation? Denies  Danger to Others  Danger to Others None reported or observed

## 2023-10-07 NOTE — [facility] Group Notes (Signed)
Adult Psychoeducational Group Note  Date:  10/07/2023 Time:  7:47 PM  Group Topic/Focus:  Goals Group:   The focus of this group is to help patients establish daily goals to achieve during treatment and discuss how the patient can incorporate goal setting into their daily lives to aide in recovery. Orientation:   The focus of this group is to educate the patient on the purpose and policies of crisis stabilization and provide a format to answer questions about their admission.  The group details unit policies and expectations of patients while admitted.  Participation Level:  Active  Participation Quality:  Appropriate  Affect:  Appropriate  Cognitive:  Appropriate  Insight: Appropriate  Engagement in Group:  Engaged  Modes of Intervention:  Discussion  Additional Comments:  Pt attended the goals group and remained appropriate and engaged throughout the duration of the group.   Sheran Lawless 10/07/2023, 7:47 PM

## 2023-10-07 NOTE — Progress Notes (Signed)
Nursing 1:1 note D:Pt observed sitting in bed with eyes open. RR even and unlabored. No distress noted. A: 1:1 observation continues for safety  R: pt remains safe  

## 2023-10-07 NOTE — Plan of Care (Signed)
Problem: Activity: Goal: Interest or engagement in activities will improve Outcome: Progressing   Problem: Health Behavior/Discharge Planning: Goal: Compliance with treatment plan for underlying cause of condition will improve Outcome: Progressing   Problem: Safety: Goal: Periods of time without injury will increase Outcome: Progressing  Pt remains medication compliant. Cooperative with care this evening. Changed his bed linen. Tolerated dinner and fluids well. 1:1 observation maintained for safety without incident. Support, encouragement and reassurance offered to pt.

## 2023-10-07 NOTE — Progress Notes (Signed)
Pt visible at medication window on initial contact. Noted with fair eye contact, logical speech, congruent affect and is ambulatory with walker. Denies SI, HI, AVH and pain when assessed "not right now". Awake in dayroom for scheduled orientation group and participated well. Remains medication compliant without adverse drug reactions. 1:1 observation maintained without incident. Assigned staff in attendance at all times. Emotional support, encouragement and reassurance offered to pt.

## 2023-10-07 NOTE — Progress Notes (Addendum)
Pt noted with increased restlessness, hyperactivity, disrobing; taking off his pants, shirts and underwear at frequent intervals "the voices are telling me to do take my clothes off" multiple times this afternoon. Pt not verbally redirectable at this time. PRN Benadryl 50 mg and Ativan 2 mg both IM in left deltoid at 1255. Pt observed to be calmer, redirectable and able to visit with his mother when reassessed at 1350. 1:1 observation maintained with assigned staff in attendance at all times.

## 2023-10-07 NOTE — Group Note (Signed)
Recreation Therapy Group Note   Group Topic:Leisure Education  Group Date: 10/07/2023 Start Time: 1040 End Time: 1120 Facilitators: Javed Cotto-McCall, LRT,CTRS Location: [address] Dayroom   Group Topic: Leisure Education  Goal Area(s) Addresses:  Patient will identify positive leisure activities.  Patient will identify one positive benefit of participation in leisure activities.   Intervention: Leisure Group Game  Group Description: Patient and LRT discussed what leisure interests were and why they are important. LRT explained to patients leisure can be something as simple as a word search or a extravagant as going on a trip to another state/country. LRT gave patients a set of brain teasers, to represent the simplest of leisure activities. Patients were to figure out what each puzzle ment. Patients were given the option to work together or solo on the assignment. Once complete, LRT went over the answers for the brain teasers with patients to see how many they were able to figure out.    Education:  Leisure Exposure, Pharmacist, community, Discharge Planning  Education Outcome: Acknowledges education/In group clarification offered/Needs additional education   Affect/Mood: N/A   Participation Level: Did not attend    Clinical Observations/Individualized Feedback:    Plan: Continue to engage patient in RT group sessions 2-3x/week.   Trenia Tennyson-McCall, LRT,CTRS 10/07/2023 1:53 PM

## 2023-10-08 DIAGNOSIS — F251 Schizoaffective disorder, depressive type: Secondary | ICD-10-CM | POA: Diagnosis not present

## 2023-10-08 LAB — CLOZAPINE (CLOZARIL)
Clozapine Lvl: 471 ng/mL (ref 350–600)
NorClozapine: 212 ng/mL
Total(Cloz+Norcloz): 683 ng/mL

## 2023-10-08 LAB — CK: Total CK: 276 U/L (ref 49–397)

## 2023-10-08 NOTE — Progress Notes (Signed)
Nursing 1:1 note D:Pt observed sleeping in bed with eyes closed. RR even and unlabored. No distress noted. A: 1:1 observation continues for safety  R: pt remains safe  

## 2023-10-08 NOTE — [facility] Group Notes (Signed)
Patient did not attend the Wrap-up group. 

## 2023-10-08 NOTE — Progress Notes (Signed)
[facility] MD Progress Note  10/08/2023 4:13 PM Brady Hoffman  MRN:  [id number] Subjective:  Brady Hoffman was seen during the visit with his mother and again in the afternoon. He feels "alright" today. Still having pill rolling tremor but not as pronounced. Able to rest in bed today. Still having auditory hallucinations telling him bad things. Not suicidal today. Not very conversational.   Spoke briefly with mother, Brady Hoffman. She feels that the patient is "better than he was" on admit. She is concerned that "the medicine's not working as well as it did" and is concerned that he does not express his needs to providers.   Principal Problem: Schizoaffective disorder, depressive type (HCC) Diagnosis: Principal Problem:   Schizoaffective disorder, depressive type (HCC)  Total Time spent with patient: 30 minutes  Past Psychiatric History:  Previous Psych Diagnoses: Panic disorder with agoraphobia, history of seizures, history of alcohol use disorder, schizoaffective disorder Prior inpatient treatment: Was admitted to [facility] 5 years ago Current/prior outpatient treatment: Yes with Dr. Jannifer Franklin Prior rehab hx: Denies Psychotherapy hx: Yes History of suicide: Denies History of homicide or aggression: Denies Psychiatric medication history: Patient has been on trial Seroquel, Prolixin, Effexor, and Haldol Psychiatric medication compliance history: Compliance Neuromodulation history: Denies Current Psychiatrist: Dr. Jannifer Franklin Current therapist: Dr. 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    Pervasive developmental disorder    Schizoaffective disorder    Seizures (HCC)     Past Surgical History:  Procedure Laterality Date   ESOPHAGOGASTRODUODENOSCOPY  11/01/2011   Procedure: ESOPHAGOGASTRODUODENOSCOPY  (EGD);  Surgeon: Theda Belfast;  Location: [facility];  Service: Endoscopy;  Laterality: N/A;   MOLE REMOVAL     WISDOM TOOTH EXTRACTION     Family History:  Family History  Adopted: Yes   Family Psychiatric  History:  Medical: Patient unsure Psych: Patient unsure Psych Rx: Patient unsure SA/HA: Patient unsure Substance use family hx: Patient unsure Social History:  Social History   Substance and Sexual Activity  Alcohol Use No     Social History   Substance and Sexual Activity  Drug Use No    Social History   Socioeconomic History   Marital status: Single    Spouse name: Not on file   Number of children: 0   Years of education: College   Highest education level: Not on file  Occupational History    Employer: OTHER  Tobacco Use   Smoking status: Never   Smokeless tobacco: Never  Substance and Sexual Activity   Alcohol use: No   Drug use: No   Sexual activity: Never  Other Topics Concern   Not on file  Social History Narrative   Patient lives in [facility].   Right handed.   Education  Two years of college.Caffeine Use: 2 cups daily  Coke cola.    Social Determinants of Health   Financial Resource Strain: Not on file  Food Insecurity: No Food Insecurity (09/21/2023)   Hunger Vital Sign    Worried About Running Out of Food in the Last Year: Never true    Ran Out of Food in the Last Year: Never true  Transportation Needs: No Transportation Needs (09/21/2023)   PRAPARE - Administrator, Civil Service (Medical):  No    Lack of Transportation (Non-Medical): No  Physical Activity: Not on file  Stress: Not on file  Social Connections: Not on file   Additional Social History:  Childhood (bring, raised, lives now, parents, siblings, schooling, education): Some college Abuse: Denies Marital Status: Denies Sexual orientation: Male from birth Children: No children Employment: Unemployed Peer Group: Denies peer group Housing: Lives  at the group home Finances: Some financial difficulty Legal: Denies 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 [employer]    Sleep: Good  Appetite:  Fair  Current Medications: Current Facility-Administered Medications  Medication Dose Route Frequency Provider Last Rate Last Admin   acetaminophen (TYLENOL) tablet 650 mg  650 mg Oral Q6H PRN Dahlia Byes C, NP   650 mg at 10/01/23 0837   alum & mag hydroxide-simeth (MAALOX/MYLANTA) 200-200-20 MG/5ML suspension 30 mL  30 mL Oral Q4H PRN Dahlia Byes C, NP       amantadine (SYMMETREL) capsule 100 mg  100 mg Oral Daily Marten Iles, Shelbie Hutching, MD   100 mg at 10/08/23 4098   aspirin chewable tablet 81 mg  81 mg Oral Daily Onuoha, Josephine C, NP   81 mg at 10/08/23 0823   atorvastatin (LIPITOR) tablet 20 mg  20 mg Oral QHS Onuoha, Josephine C, NP   20 mg at 10/07/23 2038   bismuth subsalicylate (PEPTO BISMOL) chewable tablet 524 mg  524 mg Oral Q3H PRN Augusto Gamble, MD       cloZAPine (CLOZARIL) tablet 150 mg  150 mg Oral QHS Carrion-Carrero, Margely, MD   150 mg at 10/07/23 2038   cloZAPine (CLOZARIL) tablet 150 mg  150 mg Oral Daily Attiah, Nadir, MD   150 mg at 10/08/23 0824   diphenhydrAMINE (BENADRYL) capsule 50 mg  50 mg Oral TID PRN Dahlia Byes C, NP   50 mg at 09/27/23 1024   Or   diphenhydrAMINE (BENADRYL) injection 50 mg  50 mg Intramuscular TID PRN Dahlia Byes C, NP   50 mg at 10/07/23 1254   diphenhydrAMINE (BENADRYL) capsule 50 mg  50 mg Oral QHS Attiah, Nadir, MD   50 mg at 10/07/23 2037   docusate sodium (COLACE) capsule 100 mg  100 mg Oral Daily Onuoha, Josephine C, NP   100 mg at 10/08/23 0823   FLUoxetine (PROZAC) capsule 40 mg  40 mg Oral Daily Nyelli Samara, Shelbie Hutching, MD   40 mg at 10/08/23 0823   influenza vac split trivalent PF (FLULAVAL) injection 0.5 mL  0.5 mL Intramuscular Tomorrow-1000 Massengill, Harrold Donath, MD       levETIRAcetam (KEPPRA) tablet 500 mg  500 mg Oral BID Welford Roche, Josephine C, NP   500 mg at 10/08/23  1601   levothyroxine (SYNTHROID) tablet 50 mcg  50 mcg Oral Q0600 Dahlia Byes C, NP   50 mcg at 10/08/23 0629   LORazepam (ATIVAN) tablet 2 mg  2 mg Oral TID PRN Dahlia Byes C, NP   2 mg at 09/27/23 1023   Or   LORazepam (ATIVAN) injection 2 mg  2 mg Intramuscular TID PRN Dahlia Byes C, NP   2 mg at 10/07/23 1255   LORazepam (ATIVAN) tablet 0.5 mg  0.5 mg Oral Q8H PRN Dahlia Byes C, NP   0.5 mg at 10/01/23 1419   LORazepam (ATIVAN) tablet 1 mg  1 mg Oral BID Abbott Pao, Nadir, MD   1 mg at 10/08/23 1601   mupirocin cream (BACTROBAN) 2 %   Topical Daily Phineas Inches, MD   Given at 10/08/23 [phone]  ondansetron (ZOFRAN) tablet 8 mg  8 mg Oral Q8H PRN Augusto Gamble, MD       pantoprazole (PROTONIX) EC tablet 80 mg  80 mg Oral Daily Onuoha, Josephine C, NP   80 mg at 10/08/23 1610   polyethylene glycol (MIRALAX / GLYCOLAX) packet 17 g  17 g Oral Daily PRN Augusto Gamble, MD       senna Mancel Parsons) tablet 8.6 mg  1 tablet Oral QHS PRN Augusto Gamble, MD        Lab Results:  Results for orders placed or performed during the hospital encounter of 09/21/23 (from the past 48 hour(s))  CK     Status: None   Collection Time: 10/08/23  6:28 AM  Result Value Ref Range   Total CK 276 49 - 397 U/L    Comment: Performed at [facility], [address] [place], [place] [address]    Blood Alcohol level:  Lab Results  Component Value Date   [facility] <10 09/19/2023   ETH <10 09/28/2019    Metabolic Disorder Labs: Lab Results  Component Value Date   HGBA1C 5.7 (H) 09/23/2023   MPG 116.89 09/23/2023   MPG 114 02/25/2014   No results found for: "PROLACTIN" Lab Results  Component Value Date   CHOL 144 07/03/2023   TRIG 152 (H) 07/03/2023   HDL 37 (L) 07/03/2023   CHOLHDL 3.9 07/03/2023   VLDL 28 10/20/2009   LDLCALC 82 07/03/2023   LDLCALC 59 10/02/2021    Physical Findings: AIMS: Facial and Oral Movements Muscles of Facial Expression: Mild Lips and Perioral  Area: Minimal, may be extreme normal Jaw: Minimal, may be extreme normal Tongue: Minimal, may be extreme normal,Extremity Movements Upper (arms, wrists, hands, fingers): Mild Lower (legs, knees, ankles, toes): Mild, Trunk Movements Neck, shoulders, hips: Moderate, Global Judgements Severity of abnormal movements overall : Moderate Incapacitation due to abnormal movements: Moderate Patient's awareness of abnormal movements: Aware, moderate distress, Dental Status Current problems with teeth and/or dentures?: No Does patient usually wear dentures?: No Edentia?: No  CIWA:    COWS:     Musculoskeletal: Strength & Muscle Tone: within normal limits Gait & Station: unsteady Patient leans: Front  Psychiatric Specialty Exam:  Presentation  General Appearance:  Disheveled  Eye Contact: Poor  Speech: Blocked  Speech Volume: Normal  Handedness: -- (not assessed)   Mood and Affect  Mood: Anxious  Affect: Flat   Thought Process  Thought Processes: Disorganized  Descriptions of Associations:Loose  Orientation:Partial  Thought Content:Rumination  History of Schizophrenia/Schizoaffective disorder:No data recorded Duration of Psychotic Symptoms:No data recorded Hallucinations:Hallucinations: Auditory  Ideas of Reference:Delusions; Paranoia  Suicidal Thoughts:Suicidal Thoughts: No SI Passive Intent and/or Plan: Without Plan  Homicidal Thoughts:Homicidal Thoughts: No   Sensorium  Memory: Immediate Fair; Recent Poor  Judgment: Poor  Insight: Poor   Executive Functions  Concentration: Poor  Attention Span: Poor  Recall: Poor  Fund of Knowledge: Poor  Language: Fair   Psychomotor Activity  Psychomotor Activity: Psychomotor Activity: Tremor Extrapyramidal Side Effects (EPS): Parkinsonism AIMS Completed?: Yes   Assets  Assets: Desire for Improvement; Other (comment) (group home)   Sleep  Sleep: Sleep: Fair    Physical  Exam: Physical Exam Vitals and nursing note reviewed.  HENT:     Head: Normocephalic and atraumatic.  Eyes:     Extraocular Movements: Extraocular movements intact.  Pulmonary:     Effort: Pulmonary effort is normal.  Musculoskeletal:     Cervical back: Normal range of motion.  Neurological:     General: No focal deficit present.     Mental Status: He is alert.  Psychiatric:        Attention and Perception: He perceives auditory hallucinations.    Review of Systems  Gastrointestinal:  Negative for constipation, diarrhea, nausea and vomiting.  Musculoskeletal:  Negative for myalgias.  Neurological:  Positive for tremors.  Psychiatric/Behavioral:  Positive for hallucinations. Negative for suicidal ideas.    Blood pressure 111/84, pulse 100, temperature 98.2 F (36.8 C), temperature source Oral, resp. rate 16, height 6\' 3"  (1.905 m), weight 94.8 kg, SpO2 97%. Body mass index is 26.12 kg/m.   Treatment Plan Summary: Daily contact with patient to assess and evaluate symptoms and progress in treatment and Medication management     ASSESSMENT: Ova Meegan is a 57 year old Caucasian male with prior psychiatric history significant for schizoaffective disorder, seizure disorder, developmental delay, alcohol use disorder, who presents voluntarily [facility] from [facility] health ED at [facility] for evaluation for his mental health illness and frequent incidence of falls. After medical evaluation/stabilization & clearance, he was transferred to the [facility] for further psychiatric evaluation & treatments.  Patient was initially planned to be discharged 11/4, but appeared to have regression in obsessive behaviors/rituals and exacerbation of psychosis     PLAN: Safety and Monitoring:              -- Voluntary admission to inpatient psychiatric unit for safety, stabilization and treatment             -- Daily contact with patient to assess and evaluate symptoms and progress in treatment              -- Patient's case to be discussed in multi-disciplinary team meeting             -- Observation Level : 1:1 level of observation continued             -- Vital signs:  q12 hours             -- Precautions: suicide, elopement, and assault   2. Interventions (medications, psychoeducation, etc):  Clozapine regiment adjusted: Clozapine 150mg  PO BID Repeat Ativan 1 mg IM as needed for onset of any new episodes  Continue Ativan 1 mg p.o. twice daily for akathisia Continue Benadryl 50 mg at bedtime for concern for EPS symptoms  reduce fluoxetine 40 mg daily - this medication has been associated with restlessness at high doses Added amantadine 100mg  PO daily for the tremor     -- removed haloperidol from agitation protocol due to worsening cholinergic symptoms -- medical management: d/c atropine at bedtime for droolingpt now having dry mouth, aspirin, atorvastatin, docusate sodium, levetiracetam, levothyroxine, and pantoprazole -- Patient does not need nicotine replacement   PRN medications for symptomatic management:              -- lorazepam 0.5 mg every 8 hours as needed for anxiety              -- continue acetaminophen 650 mg every 6 hours as needed for mild to moderate pain, fever, and headaches              -- continue bismuth subsalicylate 524 mg oral chewable tablet every 3 hours as needed for diarrhea / loose stools              -- continue senna 8.6 mg oral at bedtime and polyethylene glycol 17 g oral daily as needed for mild  to moderate constipation              -- continue ondansetron 8 mg every 8 hours as needed for nausea or vomiting              -- continue aluminum-magnesium hydroxide + simethicone 30 mL every 4 hours as needed for heartburn or indigestion             -- As needed agitation protocol in-place   The risks/benefits/side-effects/alternatives to the above medication were discussed in detail with the patient and time was given for questions. The patient consents  to medication trial. FDA black box warnings, if present, were discussed.   The patient is agreeable with the medication plan, as above. We will monitor the patient's response to pharmacologic treatment, and adjust medications as necessary.   3. Routine and other pertinent labs: Metabolic profile: UrineBMI: Body mass index is 26.12 kg/m. Clozapine on 10/19 is 1723, 701 on 10/27 EKG monitoring: QTc: 479 on 09/27/2023   Clozapine level pending 11/4   CK level 11/6 was within acceptable limits   4. Group Therapy:             -- Encouraged patient to participate in unit milieu and in scheduled group therapies              -- Short Term Goals: Ability to identify changes in lifestyle to reduce recurrence of condition, verbalize feelings, identify and develop effective coping behaviors, maintain clinical measurements within normal limits, and identify triggers associated with substance abuse/mental health issues will improve. Improvement in ability to demonstrate self-control and comply with prescribed medications.             -- Long Term Goals: Improvement in symptoms so as ready for discharge -- Patient is encouraged to participate in group therapy while admitted to the psychiatric unit. -- We will address other chronic and acute stressors, which contributed to the patient's Schizoaffective disorder, depressive type (HCC) in order to reduce the risk of self-harm at discharge.   5. Discharge Planning:              -- Social work and case management to assist with discharge planning and identification of hospital follow-up needs prior to discharge             -- Estimated LOS: circa 11/9             -- Discharge Concerns: Need to establish a safety plan; Medication compliance and effectiveness             -- Discharge Goals: Return to the group home with outpatient referrals for mental health follow-up including medication management/psychotherapy   I certify that inpatient services furnished  can reasonably be expected to improve the patient's condition.      Roselle Locus, MD 10/08/2023, 4:13 PM

## 2023-10-08 NOTE — Group Note (Signed)
Recreation Therapy Group Note   Group Topic:Health and Wellness  Group Date: 10/08/2023 Start Time: 1030 End Time: 1055 Facilitators: Crysten Kaman-McCall, LRT,CTRS Location: [address] Dayroom   Group Topic: Exercise  Goal Area(s) Addresses:  Patient will identify benefits of exercise. Patient will identify a place in the community to go and exercise.  Patient will identify what they like to do for exercise.   Intervention: Dayroom Exercises  Group Description: Group was started with sttretching and discussing exercise, the benefits of exercise and where to exercise. The patients and the LRT did exercises in the dayroom with the group. Patients took turns leading the group in the exercises of their choosing. Patients were encouraged to drink water and take breaks if needed. Patients and LRT debriefed and recapped what they learned about benefits of exercise, and where they can go to exercise.    Education Outcome: Acknowledges education   Affect/Mood: N/A   Participation Level: Did not attend    Clinical Observations/Individualized Feedback:     Plan: Continue to engage patient in RT group sessions 2-3x/week.   Geary Rufo-McCall, LRT,CTRS 10/08/2023 11:43 AM

## 2023-10-08 NOTE — Progress Notes (Signed)
1:1 Note Patient observed lying in bed awake. Patient is calm and pleasant. No distress noted. Patient offers no complaints. 1:1 observation ongoing. Patient safety maintained.

## 2023-10-08 NOTE — Progress Notes (Signed)
Adult Psychoeducational Group Note  Date:  10/08/2023 Time:  11:13 AM  Group Topic/Focus:    Participation Level:  Did Not Attend  Participation Quality:   na  Affect:   na  Cognitive:   na  Insight: None  Engagement in Group:   na  Modes of Intervention:   na  Additional Comments:  na  Brady Hoffman 10/08/2023, 11:13 AM

## 2023-10-08 NOTE — Plan of Care (Signed)
  Problem: Education: Goal: Emotional status will improve Outcome: Progressing Goal: Mental status will improve Outcome: Progressing Goal: Verbalization of understanding the information provided will improve Outcome: Progressing   Problem: Activity: Goal: Interest or engagement in activities will improve Outcome: Progressing Goal: Sleeping patterns will improve Outcome: Progressing   Problem: Coping: Goal: Ability to verbalize frustrations and anger appropriately will improve Outcome: Progressing Goal: Ability to demonstrate self-control will improve Outcome: Progressing   Problem: Health Behavior/Discharge Planning: Goal: Identification of resources available to assist in meeting health care needs will improve Outcome: Progressing Goal: Compliance with treatment plan for underlying cause of condition will improve Outcome: Progressing   

## 2023-10-08 NOTE — Progress Notes (Signed)
1:1 note Patient visiting with mother at this time. Patient is calm and cooperative and in no visible distress. Patient's mother states she thinks his anxiety is increasing. Patient given scheduled lorazepam. 1:1 observation continued.

## 2023-10-08 NOTE — Progress Notes (Signed)
1:1 Note  Patient is resting and in no apparent distress. Respirations are even and unlabored. 1:1 observation continued. Safety maintained.

## 2023-10-08 NOTE — Progress Notes (Signed)
   10/08/23 0800  Psych Admission Type (Psych Patients Only)  Admission Status Voluntary  Psychosocial Assessment  Patient Complaints None  Eye Contact Poor  Facial Expression Flat  Affect Appropriate to circumstance  Speech Logical/coherent  Interaction Cautious;Childlike  Motor Activity Tremors;Other (Comment) (pill rolling)  Appearance/Hygiene Poor hygiene;Disheveled  Behavior Characteristics Cooperative  Mood Preoccupied;Pleasant  Thought Process  Coherency Concrete thinking  Content WDL  Delusions None reported or observed  Perception WDL  Hallucination None reported or observed  Judgment Limited  Confusion None  Danger to Self  Current suicidal ideation? Denies  Danger to Others  Danger to Others None reported or observed

## 2023-10-08 NOTE — [facility] Group Notes (Signed)
Patient did not attend the Social Wellness group.

## 2023-10-08 NOTE — Plan of Care (Signed)
  Problem: Education: Goal: Emotional status will improve Outcome: Progressing Goal: Mental status will improve Outcome: Progressing   Problem: Activity: Goal: Sleeping patterns will improve Outcome: Progressing   Problem: Coping: Goal: Ability to demonstrate self-control will improve Outcome: Progressing   Problem: Activity: Goal: Interest or engagement in activities will improve Outcome: Not Progressing

## 2023-10-08 NOTE — Progress Notes (Signed)
   10/08/23 2000  Psych Admission Type (Psych Patients Only)  Admission Status Voluntary  Psychosocial Assessment  Patient Complaints None  Eye Contact Poor  Facial Expression Flat  Affect Appropriate to circumstance  Speech Logical/coherent  Interaction Cautious;Childlike  Motor Activity Fidgety;Slow  Appearance/Hygiene Unremarkable  Behavior Characteristics Cooperative  Mood Anxious  Aggressive Behavior  Effect No apparent injury  Thought Process  Coherency Concrete thinking  Content WDL  Delusions WDL  Perception WDL  Hallucination None reported or observed  Judgment Limited  Confusion WDL  Danger to Self  Current suicidal ideation? Denies  Danger to Others  Danger to Others None reported or observed

## 2023-10-09 DIAGNOSIS — F251 Schizoaffective disorder, depressive type: Secondary | ICD-10-CM | POA: Diagnosis not present

## 2023-10-09 MED ORDER — CLOZAPINE 100 MG PO TABS
200.0000 mg | ORAL_TABLET | Freq: Every day | ORAL | Status: DC
  Administered 2023-10-09 – 2023-10-15 (×7): 200 mg via ORAL
  Filled 2023-10-09 (×9): qty 2

## 2023-10-09 MED ORDER — CLOZAPINE 100 MG PO TABS
100.0000 mg | ORAL_TABLET | Freq: Every day | ORAL | Status: DC
Start: 2023-10-10 — End: 2023-10-16
  Administered 2023-10-10 – 2023-10-16 (×7): 100 mg via ORAL
  Filled 2023-10-09 (×9): qty 1

## 2023-10-09 NOTE — Progress Notes (Signed)
[facility] MD Progress Note  10/09/2023 1:22 PM Brady Hoffman  MRN:  [id number] Subjective:  Brady Hoffman was seen in his room on rounds today. He had already had the PRN for anxiety and disruptive behaviors by then. He was still having some guilt and negative feelings about the episode. He recalls that he starts to have thoughts of "germs", and he will stay in the bathroom because he feels that this room is cleaner. He does not know what the triggers for his thoughts are. He is still hearing the voices that are "the usual" today. No thoughts of harm to self or others  Principal Problem: Schizoaffective disorder, depressive type (HCC) Diagnosis: Principal Problem:   Schizoaffective disorder, depressive type (HCC)  Total Time spent with patient: 20 minutes  Past Psychiatric History:  Previous Psych Diagnoses: Panic disorder with agoraphobia, history of seizures, history of alcohol use disorder, schizoaffective disorder Prior inpatient treatment: Was admitted to [facility] 5 years ago Current/prior outpatient treatment: Yes with Dr. Jannifer Franklin Prior rehab hx: Denies Psychotherapy hx: Yes History of suicide: Denies History of homicide or aggression: Denies Psychiatric medication history: Patient has been on trial Seroquel, Prolixin, Effexor, and Haldol Psychiatric medication compliance history: Compliance Neuromodulation history: Denies Current Psychiatrist: Dr. Jannifer Franklin Current therapist: Dr. 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    Pervasive developmental disorder    Schizoaffective disorder    Seizures (HCC)     Past Surgical History:  Procedure Laterality Date   ESOPHAGOGASTRODUODENOSCOPY  11/01/2011   Procedure: ESOPHAGOGASTRODUODENOSCOPY (EGD);  Surgeon: Theda Belfast;  Location: [facility]  ENDOSCOPY;  Service: Endoscopy;  Laterality: N/A;   MOLE REMOVAL     WISDOM TOOTH EXTRACTION     Family History:  Family History  Adopted: Yes   Family Psychiatric  History: Psych: Patient unsure Psych Rx: Patient unsure SA/HA: Patient unsure Substance use family hx: Patient unsure Social History:  Social History   Substance and Sexual Activity  Alcohol Use No     Social History   Substance and Sexual Activity  Drug Use No    Social History   Socioeconomic History   Marital status: Single    Spouse name: Not on file   Number of children: 0   Years of education: College   Highest education level: Not on file  Occupational History    Employer: OTHER  Tobacco Use   Smoking status: Never   Smokeless tobacco: Never  Substance and Sexual Activity   Alcohol use: No   Drug use: No   Sexual activity: Never  Other Topics Concern   Not on file  Social History Narrative   Patient lives in [facility].   Right handed.   Education  Two years of college.Caffeine Use: 2 cups daily  Coke cola.    Social Determinants of Health   Financial Resource Strain: Not on file  Food Insecurity: No Food Insecurity (09/21/2023)   Hunger Vital Sign    Worried About Running Out of Food in the Last Year: Never true    Ran Out of Food in the Last Year: Never true  Transportation Needs: No Transportation Needs (09/21/2023)   PRAPARE - Administrator, Civil Service (Medical): No    Lack of Transportation (  Non-Medical): No  Physical Activity: Not on file  Stress: Not on file  Social Connections: Not on file   Additional Social History:   Childhood (bring, raised, lives now, parents, siblings, schooling, education): Some college Abuse: Denies Marital Status: Denies Sexual orientation: Male from birth Children: No children Employment: Unemployed Peer Group: Denies peer group Housing: Lives at the group home Finances: Some financial difficulty Legal:  Denies 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 [employer]      Sleep: Good  Appetite:  Fair  Current Medications: Current Facility-Administered Medications  Medication Dose Route Frequency Provider Last Rate Last Admin   acetaminophen (TYLENOL) tablet 650 mg  650 mg Oral Q6H PRN Dahlia Byes C, NP   650 mg at 10/01/23 0837   alum & mag hydroxide-simeth (MAALOX/MYLANTA) 200-200-20 MG/5ML suspension 30 mL  30 mL Oral Q4H PRN Dahlia Byes C, NP       amantadine (SYMMETREL) capsule 100 mg  100 mg Oral Daily Najir Roop, Shelbie Hutching, MD   100 mg at 10/09/23 1610   aspirin chewable tablet 81 mg  81 mg Oral Daily Onuoha, Josephine C, NP   81 mg at 10/09/23 0824   atorvastatin (LIPITOR) tablet 20 mg  20 mg Oral QHS Onuoha, Josephine C, NP   20 mg at 10/08/23 2026   bismuth subsalicylate (PEPTO BISMOL) chewable tablet 524 mg  524 mg Oral Q3H PRN Augusto Gamble, MD       [START ON 10/10/2023] cloZAPine (CLOZARIL) tablet 100 mg  100 mg Oral Daily Merridy Pascoe, Shelbie Hutching, MD       cloZAPine (CLOZARIL) tablet 200 mg  200 mg Oral QHS Stephan Nelis, Shelbie Hutching, MD       diphenhydrAMINE (BENADRYL) capsule 50 mg  50 mg Oral TID PRN Dahlia Byes C, NP   50 mg at 09/27/23 1024   Or   diphenhydrAMINE (BENADRYL) injection 50 mg  50 mg Intramuscular TID PRN Dahlia Byes C, NP   50 mg at 10/09/23 0943   diphenhydrAMINE (BENADRYL) capsule 50 mg  50 mg Oral QHS Attiah, Nadir, MD   50 mg at 10/08/23 2026   docusate sodium (COLACE) capsule 100 mg  100 mg Oral Daily Onuoha, Josephine C, NP   100 mg at 10/09/23 0823   FLUoxetine (PROZAC) capsule 40 mg  40 mg Oral Daily Kierra Jezewski, Shelbie Hutching, MD   40 mg at 10/09/23 9604   influenza vac split trivalent PF (FLULAVAL) injection 0.5 mL  0.5 mL Intramuscular Tomorrow-1000 Massengill, Harrold Donath, MD       levETIRAcetam (KEPPRA) tablet 500 mg  500 mg Oral BID Dahlia Byes C, NP   500 mg at 10/09/23 0824   levothyroxine (SYNTHROID) tablet 50 mcg  50 mcg Oral Q0600 Dahlia Byes C, NP   50 mcg at 10/09/23 0609   LORazepam (ATIVAN) tablet 2 mg  2 mg Oral TID PRN Dahlia Byes C, NP   2 mg at 09/27/23 1023   Or   LORazepam (ATIVAN) injection 2 mg  2 mg Intramuscular TID PRN Dahlia Byes C, NP   2 mg at 10/09/23 0941   LORazepam (ATIVAN) tablet 0.5 mg  0.5 mg Oral Q8H PRN Dahlia Byes C, NP   0.5 mg at 10/09/23 0826   LORazepam (ATIVAN) tablet 1 mg  1 mg Oral BID Abbott Pao, Nadir, MD   1 mg at 10/08/23 1601   mupirocin cream (BACTROBAN) 2 %   Topical Daily Phineas Inches, MD   1 Application at 10/09/23 0824   ondansetron (ZOFRAN)  tablet 8 mg  8 mg Oral Q8H PRN Augusto Gamble, MD       pantoprazole (PROTONIX) EC tablet 80 mg  80 mg Oral Daily Onuoha, Josephine C, NP   80 mg at 10/09/23 0824   polyethylene glycol (MIRALAX / GLYCOLAX) packet 17 g  17 g Oral Daily PRN Augusto Gamble, MD       senna Mancel Parsons) tablet 8.6 mg  1 tablet Oral QHS PRN Augusto Gamble, MD        Lab Results:  Results for orders placed or performed during the hospital encounter of 09/21/23 (from the past 48 hour(s))  CK     Status: None   Collection Time: 10/08/23  6:28 AM  Result Value Ref Range   Total CK 276 49 - 397 U/L    Comment: Performed at [facility], [address], [place], [place] [address]    Blood Alcohol level:  Lab Results  Component Value Date   [facility] Hights <10 09/19/2023   ETH <10 09/28/2019    Metabolic Disorder Labs: Lab Results  Component Value Date   HGBA1C 5.7 (H) 09/23/2023   MPG 116.89 09/23/2023   MPG 114 02/25/2014   No results found for: "PROLACTIN" Lab Results  Component Value Date   CHOL 144 07/03/2023   TRIG 152 (H) 07/03/2023   HDL 37 (L) 07/03/2023   CHOLHDL 3.9 07/03/2023   VLDL 28 10/20/2009   LDLCALC 82 07/03/2023   LDLCALC 59 10/02/2021    Physical Findings: AIMS: Facial and Oral Movements Muscles of Facial Expression: Mild Lips and Perioral Area: Minimal, may be extreme normal Jaw: Minimal, may be extreme  normal Tongue: Minimal, may be extreme normal,Extremity Movements Upper (arms, wrists, hands, fingers): Mild Lower (legs, knees, ankles, toes): Mild, Trunk Movements Neck, shoulders, hips: Moderate, Global Judgements Severity of abnormal movements overall : Moderate Incapacitation due to abnormal movements: Moderate Patient's awareness of abnormal movements: Aware, moderate distress, Dental Status Current problems with teeth and/or dentures?: No Does patient usually wear dentures?: No Edentia?: No  CIWA:    COWS:     Musculoskeletal: Strength & Muscle Tone: within normal limits Gait & Station: unsteady Patient leans: Front  Psychiatric Specialty Exam:  Presentation  General Appearance:  Disheveled  Eye Contact: Poor  Speech: Blocked  Speech Volume: Normal  Handedness: -- (not assessed)   Mood and Affect  Mood: Anxious  Affect: Flat   Thought Process  Thought Processes: Disorganized  Descriptions of Associations:Loose  Orientation:Partial  Thought Content:Rumination  History of Schizophrenia/Schizoaffective disorder:No data recorded Duration of Psychotic Symptoms:No data recorded Hallucinations:Hallucinations: Auditory  Ideas of Reference:Delusions; Paranoia  Suicidal Thoughts:Suicidal Thoughts: No  Homicidal Thoughts:Homicidal Thoughts: No   Sensorium  Memory: Immediate Fair; Recent Poor  Judgment: Poor  Insight: Poor   Executive Functions  Concentration: Poor  Attention Span: Poor  Recall: Poor  Fund of Knowledge: Poor  Language: Fair   Psychomotor Activity  Psychomotor Activity: Psychomotor Activity: Tremor Extrapyramidal Side Effects (EPS): Parkinsonism   Assets  Assets: Desire for Improvement; Other (comment) (group home)   Sleep  Sleep: Sleep: Fair    Physical Exam: Physical Exam Constitutional:      Appearance: Normal appearance.  HENT:     Head: Normocephalic and atraumatic.  Eyes:      Extraocular Movements: Extraocular movements intact.  Pulmonary:     Effort: Pulmonary effort is normal.  Musculoskeletal:     Cervical back: Normal range of motion.  Neurological:     Mental Status:  He is alert.     Motor: Tremor present.  Psychiatric:        Mood and Affect: Mood is anxious. Affect is flat.    Review of Systems  Gastrointestinal:  Negative for constipation, diarrhea, nausea and vomiting.  Musculoskeletal:  Negative for myalgias.  Neurological:  Positive for tremors.   Blood pressure 121/77, pulse (!) 105, temperature 97.7 F (36.5 C), temperature source Oral, resp. rate 16, height 6\' 3"  (1.905 m), weight 94.8 kg, SpO2 97%. Body mass index is 26.12 kg/m.   Treatment Plan Summary: Daily contact with patient to assess and evaluate symptoms and progress in treatment and Medication management     ASSESSMENT: Brady Hoffman is a 57 year old Caucasian male with prior psychiatric history significant for schizoaffective disorder, seizure disorder, developmental delay, alcohol use disorder, who presents voluntarily [facility] from [facility] ED at [facility] for evaluation for his mental health illness and frequent incidence of falls. After medical evaluation/stabilization & clearance, he was transferred to the [facility] for further psychiatric evaluation & treatments.   Patient was initially planned to be discharged 11/4, but appeared to have regression in obsessive behaviors/rituals and exacerbation of psychosis     PLAN: Safety and Monitoring:              -- Voluntary admission to inpatient psychiatric unit for safety, stabilization and treatment             -- Daily contact with patient to assess and evaluate symptoms and progress in treatment             -- Patient's case to be discussed in multi-disciplinary team meeting             -- Observation Level : 1:1 level of observation continued             -- Vital signs:  q12 hours             -- Precautions: suicide,  elopement, and assault   2. Interventions (medications, psychoeducation, etc):  Clozapine regiment adjusted: Clozapine 100mg  PO QAM and 200mg  PO at bedtime  Repeat Ativan 1 mg IM as needed for onset of any new episodes   Continue Ativan 1 mg p.o. twice daily for akathisia Continue Benadryl 50 mg at bedtime for concern for EPS symptoms  reduced fluoxetine 40 mg daily - this medication has been associated with restlessness at high doses Added amantadine 100mg  PO daily for the tremor     -- removed haloperidol from agitation protocol due to worsening cholinergic symptoms -- medical management: d/c atropine at bedtime for droolingpt now having dry mouth, aspirin, atorvastatin, docusate sodium, levetiracetam, levothyroxine, and pantoprazole -- Patient does not need nicotine replacement   PRN medications for symptomatic management:              -- lorazepam 0.5 mg every 8 hours as needed for anxiety              -- continue acetaminophen 650 mg every 6 hours as needed for mild to moderate pain, fever, and headaches              -- continue bismuth subsalicylate 524 mg oral chewable tablet every 3 hours as needed for diarrhea / loose stools              -- continue senna 8.6 mg oral at bedtime and polyethylene glycol 17 g oral daily as needed for mild to moderate constipation              --  continue ondansetron 8 mg every 8 hours as needed for nausea or vomiting              -- continue aluminum-magnesium hydroxide + simethicone 30 mL every 4 hours as needed for heartburn or indigestion             -- As needed agitation protocol in-place   The risks/benefits/side-effects/alternatives to the above medication were discussed in detail with the patient and time was given for questions. The patient consents to medication trial. FDA black box warnings, if present, were discussed.   The patient is agreeable with the medication plan, as above. We will monitor the patient's response to pharmacologic  treatment, and adjust medications as necessary.   3. Routine and other pertinent labs: Metabolic profile: UrineBMI: Body mass index is 26.12 kg/m. Clozapine on 10/19 is 1723, 701 on 10/27, 471 (therapeutic) on 10/06/23 EKG monitoring: QTc: 479 on 09/27/2023, 449 on 10/05/23   Clozapine level pending 11/4   CK level 11/6 was within acceptable limits   4. Group Therapy:             -- Encouraged patient to participate in unit milieu and in scheduled group therapies              -- Short Term Goals: Ability to identify changes in lifestyle to reduce recurrence of condition, verbalize feelings, identify and develop effective coping behaviors, maintain clinical measurements within normal limits, and identify triggers associated with substance abuse/mental health issues will improve. Improvement in ability to demonstrate self-control and comply with prescribed medications.             -- Long Term Goals: Improvement in symptoms so as ready for discharge -- Patient is encouraged to participate in group therapy while admitted to the psychiatric unit. -- We will address other chronic and acute stressors, which contributed to the patient's Schizoaffective disorder, depressive type (HCC) in order to reduce the risk of self-harm at discharge.   5. Discharge Planning:              -- Social work and case management to assist with discharge planning and identification of hospital follow-up needs prior to discharge             -- Estimated LOS: circa 11/9             -- Discharge Concerns: Need to establish a safety plan; Medication compliance and effectiveness             -- Discharge Goals: Return to the group home with outpatient referrals for mental health follow-up including medication management/psychotherapy   I certify that inpatient services furnished can reasonably be expected to improve the patient's condition.    Roselle Locus, MD 10/09/2023, 1:22 PM

## 2023-10-09 NOTE — Progress Notes (Signed)
Pt compliant with evening medications. Tolerated dinner and fluids well. Denies concerns at this time. Emotional support, encouragement and reassurance offered. 1:1 observation maintained at Q 15 minutes intervals. Assigned staff in attendance at all times.

## 2023-10-09 NOTE — Progress Notes (Addendum)
Pt observed with compulsive episode at the beginning of this shift. Noted with increased restlessness, hyperactive, increased anxiety, perspirations, repetitive movement of disrobing off his clothes including his underwear; refused to come out of bathroom despite multiple prompts. PRN Ativan 0.5 mg PO given at 0826 without relief. Agitation protocol administered at 0941 (Benadryl 50 mg & Ativan 2 mg) both IM with desired effect when reassessed at 1040. Pt noted to be calmer, awake in bed during safety rounds. He denies SI, HI, AVH and pain when assessed. Reports he slept well with good appetite.  remains medication compliant. Tolerated breakfast and fluids well. Emotional support, encouragement and reassurance offered. 1:1 observation maintained at Q 15 minutes intervals. Assigned staff in attendance at all times.

## 2023-10-09 NOTE — [facility] Group Notes (Signed)
Adult Psychoeducational Group Note  Date:  10/09/2023 Time:  8:27 PM  Group Topic/Focus:  Wrap-Up Group:   The focus of this group is to help patients review their daily goal of treatment and discuss progress on daily workbooks.  Participation Level:  Did Not Attend  Christ Kick 10/09/2023, 8:27 PM

## 2023-10-09 NOTE — Progress Notes (Signed)
``````````````````````````````````````````````````````````````````````````````````````````````````````````````````````````````````````````````````````````````````````````````````````````````````  Adult Psychoeducational Group Note  Date:  10/09/2023 Time:  1:02 PM  Group Topic/Focus:  ```````````````````````````````````````````````````````````````````````````  Participation Level:  Did Not Attend  Participation Quality:  Attentive  Affect:   na  Cognitive:   na  Insight: None  Engagement in Group:   na  Modes of Intervention:   nA  Additional Comments:  NA  Octavio Manns 10/09/2023, 1:02 PM

## 2023-10-09 NOTE — Plan of Care (Signed)

## 2023-10-09 NOTE — Plan of Care (Signed)
  Problem: Coping: Goal: Ability to demonstrate self-control will improve Outcome: Progressing   Problem: Physical Regulation: Goal: Ability to maintain clinical measurements within normal limits will improve Outcome: Progressing   Problem: Safety: Goal: Periods of time without injury will increase Outcome: Progressing   

## 2023-10-09 NOTE — Progress Notes (Signed)
Visited with mother this earlier, awake and logical on interactions. Out of bed to dayroom briefly. Tolerated lunch and fluids well. Emotional support, encouragement and reassurance offered. 1:1 observation maintained at Q 15 minutes intervals. Assigned staff in attendance at all times.

## 2023-10-10 ENCOUNTER — Encounter (HOSPITAL_COMMUNITY): Payer: Self-pay

## 2023-10-10 DIAGNOSIS — F251 Schizoaffective disorder, depressive type: Secondary | ICD-10-CM | POA: Diagnosis not present

## 2023-10-10 NOTE — Progress Notes (Signed)
[facility] MD Progress Note  10/10/2023 3:18 PM Brady Hoffman  MRN:  [id number] Subjective:  Brady Hoffman was seen this morning in his room on rounds. He feels that he is doing "okay". His AH are "about the same" as they usually are. He is sleeping and eating okay. When asked why he had not been attending groups, he says "I just don't feel like doing anything". Discussed room lockout with patient, encouraged group attendance.   Principal Problem: Schizoaffective disorder, depressive type (HCC) Diagnosis: Principal Problem:   Schizoaffective disorder, depressive type (HCC)  Total Time spent with patient: 15 minutes  Past Psychiatric History:  Previous Psych Diagnoses: Panic disorder with agoraphobia, history of seizures, history of alcohol use disorder, schizoaffective disorder Prior inpatient treatment: Was admitted to [facility] 5 years ago Current/prior outpatient treatment: Yes with Dr. Jannifer Franklin Prior rehab hx: Denies Psychotherapy hx: Yes History of suicide: Denies History of homicide or aggression: Denies Psychiatric medication history: Patient has been on trial Seroquel, Prolixin, Effexor, and Haldol Psychiatric medication compliance history: Compliance Neuromodulation history: Denies Current Psychiatrist: Dr. Jannifer Franklin Current therapist: Dr. 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    Pervasive developmental disorder    Schizoaffective disorder    Seizures (HCC)     Past Surgical History:  Procedure Laterality Date   ESOPHAGOGASTRODUODENOSCOPY  11/01/2011   Procedure: ESOPHAGOGASTRODUODENOSCOPY (EGD);  Surgeon: Theda Belfast;  Location: [facility];  Service: Endoscopy;  Laterality: N/A;   MOLE REMOVAL     WISDOM TOOTH EXTRACTION     Family History:  Family History  Adopted: Yes    Family Psychiatric  History:  Psych: Patient unsure Psych Rx: Patient unsure SA/HA: Patient unsure Substance use family hx: Patient unsure  Social History:  Social History   Substance and Sexual Activity  Alcohol Use No     Social History   Substance and Sexual Activity  Drug Use No    Social History   Socioeconomic History   Marital status: Single    Spouse name: Not on file   Number of children: 0   Years of education: College   Highest education level: Not on file  Occupational History    Employer: OTHER  Tobacco Use   Smoking status: Never   Smokeless tobacco: Never  Substance and Sexual Activity   Alcohol use: No   Drug use: No   Sexual activity: Never  Other Topics Concern   Not on file  Social History Narrative   Patient lives in [facility].   Right handed.   Education  Two years of college.Caffeine Use: 2 cups daily  Coke cola.    Social Determinants of Health   Financial Resource Strain: Not on file  Food Insecurity: No Food Insecurity (09/21/2023)   Hunger Vital Sign    Worried About Running Out of Food in the Last Year: Never true    Ran Out of Food in the Last Year: Never true  Transportation Needs: No Transportation Needs (09/21/2023)   PRAPARE - Administrator, Civil Service (Medical): No    Lack of Transportation (Non-Medical): No  Physical Activity: Not on file  Stress: Not on file  Social Connections: Not on file   Additional Social History:  Childhood (bring, raised, lives now, parents, siblings, schooling, education): Some college Abuse: Denies Marital Status: Denies Sexual orientation: Male from birth Children: No children Employment: Unemployed Peer Group: Denies peer group Housing: Lives at the group home Finances: Some financial difficulty Legal: Denies 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 [employer]      Sleep: Good  Appetite:  Good  Current Medications: Current Facility-Administered Medications   Medication Dose Route Frequency Provider Last Rate Last Admin   acetaminophen (TYLENOL) tablet 650 mg  650 mg Oral Q6H PRN Dahlia Byes C, NP   650 mg at 10/01/23 0837   alum & mag hydroxide-simeth (MAALOX/MYLANTA) 200-200-20 MG/5ML suspension 30 mL  30 mL Oral Q4H PRN Dahlia Byes C, NP       amantadine (SYMMETREL) capsule 100 mg  100 mg Oral Daily Neyland Pettengill, Shelbie Hutching, MD   100 mg at 10/10/23 3016   aspirin chewable tablet 81 mg  81 mg Oral Daily Onuoha, Josephine C, NP   81 mg at 10/10/23 0109   atorvastatin (LIPITOR) tablet 20 mg  20 mg Oral QHS Onuoha, Josephine C, NP   20 mg at 10/09/23 2047   bismuth subsalicylate (PEPTO BISMOL) chewable tablet 524 mg  524 mg Oral Q3H PRN Augusto Gamble, MD       cloZAPine (CLOZARIL) tablet 100 mg  100 mg Oral Daily Smith Potenza, Shelbie Hutching, MD   100 mg at 10/10/23 3235   cloZAPine (CLOZARIL) tablet 200 mg  200 mg Oral QHS Atley Neubert, Shelbie Hutching, MD   200 mg at 10/09/23 2047   diphenhydrAMINE (BENADRYL) capsule 50 mg  50 mg Oral TID PRN Dahlia Byes C, NP   50 mg at 09/27/23 1024   Or   diphenhydrAMINE (BENADRYL) injection 50 mg  50 mg Intramuscular TID PRN Dahlia Byes C, NP   50 mg at 10/09/23 0943   diphenhydrAMINE (BENADRYL) capsule 50 mg  50 mg Oral QHS Attiah, Nadir, MD   50 mg at 10/09/23 2047   docusate sodium (COLACE) capsule 100 mg  100 mg Oral Daily Onuoha, Josephine C, NP   100 mg at 10/10/23 0832   FLUoxetine (PROZAC) capsule 40 mg  40 mg Oral Daily Ramanda Paules, Shelbie Hutching, MD   40 mg at 10/10/23 5732   influenza vac split trivalent PF (FLULAVAL) injection 0.5 mL  0.5 mL Intramuscular Tomorrow-1000 Massengill, Harrold Donath, MD       levETIRAcetam (KEPPRA) tablet 500 mg  500 mg Oral BID Dahlia Byes C, NP   500 mg at 10/10/23 2025   levothyroxine (SYNTHROID) tablet 50 mcg  50 mcg Oral Q0600 Dahlia Byes C, NP   50 mcg at 10/10/23 0630   LORazepam (ATIVAN) tablet 2 mg  2 mg Oral TID PRN Dahlia Byes C, NP   2 mg at 09/27/23  1023   Or   LORazepam (ATIVAN) injection 2 mg  2 mg Intramuscular TID PRN Dahlia Byes C, NP   2 mg at 10/09/23 0941   LORazepam (ATIVAN) tablet 0.5 mg  0.5 mg Oral Q8H PRN Dahlia Byes C, NP   0.5 mg at 10/09/23 0826   LORazepam (ATIVAN) tablet 1 mg  1 mg Oral BID Abbott Pao, Nadir, MD   1 mg at 10/10/23 1101   mupirocin cream (BACTROBAN) 2 %   Topical Daily Phineas Inches, MD   Given at 10/10/23 0835   ondansetron (ZOFRAN) tablet 8 mg  8 mg Oral Q8H PRN Augusto Gamble, MD       pantoprazole (PROTONIX) EC tablet 80 mg  80 mg Oral Daily Dahlia Byes C, NP   80 mg at 10/10/23 5284   polyethylene glycol (MIRALAX / GLYCOLAX) packet 17 g  17 g Oral Daily PRN Augusto Gamble, MD       senna (SENOKOT) tablet 8.6 mg  1 tablet Oral QHS PRN Augusto Gamble, MD        Lab Results: No results found for this or any previous visit (from the past 48 hour(s)).  Blood Alcohol level:  Lab Results  Component Value Date   ETH <10 09/19/2023   ETH <10 09/28/2019    Metabolic Disorder Labs: Lab Results  Component Value Date   HGBA1C 5.7 (H) 09/23/2023   MPG 116.89 09/23/2023   MPG 114 02/25/2014   No results found for: "PROLACTIN" Lab Results  Component Value Date   CHOL 144 07/03/2023   TRIG 152 (H) 07/03/2023   HDL 37 (L) 07/03/2023   CHOLHDL 3.9 07/03/2023   VLDL 28 10/20/2009   LDLCALC 82 07/03/2023   LDLCALC 59 10/02/2021    Physical Findings: AIMS: Facial and Oral Movements Muscles of Facial Expression: Mild Lips and Perioral Area: Minimal, may be extreme normal Jaw: Minimal, may be extreme normal Tongue: Minimal, may be extreme normal,Extremity Movements Upper (arms, wrists, hands, fingers): Mild Lower (legs, knees, ankles, toes): Mild, Trunk Movements Neck, shoulders, hips: Moderate, Global Judgements Severity of abnormal movements overall : Moderate Incapacitation due to abnormal movements: Moderate Patient's awareness of abnormal movements: Aware, moderate distress, Dental  Status Current problems with teeth and/or dentures?: No Does patient usually wear dentures?: No Edentia?: No  CIWA:    COWS:     Musculoskeletal: Strength & Muscle Tone: within normal limits Gait & Station: unsteady Patient leans: Front  Psychiatric Specialty Exam:  Presentation  General Appearance:  Casual  Eye Contact: Fleeting  Speech: Blocked  Speech Volume: Normal  Handedness: -- (not assessed)   Mood and Affect  Mood: Anxious  Affect: Flat   Thought Process  Thought Processes: Goal Directed  Descriptions of Associations:Loose  Orientation:Full (Time, Place and Person)  Thought Content:Illusions  History of Schizophrenia/Schizoaffective disorder:No data recorded Duration of Psychotic Symptoms:No data recorded Hallucinations:Hallucinations: Auditory  Ideas of Reference:-- (obsessions and associations)  Suicidal Thoughts:Suicidal Thoughts: No  Homicidal Thoughts:Homicidal Thoughts: No   Sensorium  Memory: Immediate Fair; Recent Poor; Remote Poor  Judgment: Poor  Insight: Poor   Executive Functions  Concentration: Poor  Attention Span: Poor  Recall: Poor  Fund of Knowledge: Poor  Language: Fair   Psychomotor Activity  Psychomotor Activity: Psychomotor Activity: Tremor Extrapyramidal Side Effects (EPS): Parkinsonism   Assets  Assets: Housing   Sleep  Sleep: Sleep: Fair    Physical Exam: Physical Exam Constitutional:      Appearance: Normal appearance.  HENT:     Head: Normocephalic and atraumatic.  Eyes:     Extraocular Movements: Extraocular movements intact.  Pulmonary:     Effort: Pulmonary effort is normal.  Musculoskeletal:     Cervical back: Normal range of motion.  Neurological:     Mental Status: He is alert.     Motor: Tremor present.  Psychiatric:        Mood and Affect: Mood is anxious. Affect is flat.    Review of Systems  Constitutional:  Negative for chills and fever.   Gastrointestinal:  Negative for constipation, diarrhea, nausea and vomiting.  Genitourinary:  Negative for dysuria.  Neurological:  Positive for tremors.  Psychiatric/Behavioral:  Positive for hallucinations. Negative for suicidal ideas.  Blood pressure 101/74, pulse 96, temperature 98.2 F (36.8 C), temperature source Oral, resp. rate 16, height 6\' 3"  (1.905 m), weight 94.8 kg, SpO2 99%. Body mass index is 26.12 kg/m.   Treatment Plan Summary: Daily contact with patient to assess and evaluate symptoms and progress in treatment and Medication management     ASSESSMENT: Christien Petrauskas is a 57 year old Caucasian male with prior psychiatric history significant for schizoaffective disorder, seizure disorder, developmental delay, alcohol use disorder, who presents voluntarily [facility] from [facility] health ED at [facility] for evaluation for his mental health illness and frequent incidence of falls. After medical evaluation/stabilization & clearance, he was transferred to the [facility] for further psychiatric evaluation & treatments.   Patient was initially planned to be discharged 11/4, but appeared to have regression in obsessive behaviors/rituals and exacerbation of psychosis     PLAN: Safety and Monitoring:              -- Voluntary admission to inpatient psychiatric unit for safety, stabilization and treatment             -- Daily contact with patient to assess and evaluate symptoms and progress in treatment             -- Patient's case to be discussed in multi-disciplinary team meeting             -- Observation Level : 1:1 level of observation continued             -- Vital signs:  q12 hours             -- Precautions: suicide, elopement, and assault   2. Interventions (medications, psychoeducation, etc):  Clozapine regiment adjusted: Clozapine 100mg  PO QAM and 200mg  PO at bedtime  Repeat Ativan 1 mg IM as needed for onset of any new episodes   Continue Ativan 1 mg p.o. twice daily  for akathisia Continue Benadryl 50 mg at bedtime for concern for EPS symptoms  reduced fluoxetine 40 mg daily - this medication has been associated with restlessness at high doses Added amantadine 100mg  PO daily for the tremor Room lockout after breakfast until lunch to encourage some ambulation and interaction     -- removed haloperidol from agitation protocol due to worsening cholinergic symptoms -- medical management: d/c atropine at bedtime for droolingpt now having dry mouth, aspirin, atorvastatin, docusate sodium, levetiracetam, levothyroxine, and pantoprazole -- Patient does not need nicotine replacement   PRN medications for symptomatic management:              -- lorazepam 0.5 mg every 8 hours as needed for anxiety              -- continue acetaminophen 650 mg every 6 hours as needed for mild to moderate pain, fever, and headaches              -- continue bismuth subsalicylate 524 mg oral chewable tablet every 3 hours as needed for diarrhea / loose stools              -- continue senna 8.6 mg oral at bedtime and polyethylene glycol 17 g oral daily as needed for mild to moderate constipation              -- continue ondansetron 8 mg every 8 hours as needed for nausea or vomiting              -- continue aluminum-magnesium hydroxide + simethicone 30 mL every 4 hours as needed  for heartburn or indigestion             -- As needed agitation protocol in-place   The risks/benefits/side-effects/alternatives to the above medication were discussed in detail with the patient and time was given for questions. The patient consents to medication trial. FDA black box warnings, if present, were discussed.   The patient is agreeable with the medication plan, as above. We will monitor the patient's response to pharmacologic treatment, and adjust medications as necessary.   3. Routine and other pertinent labs: Metabolic profile: UrineBMI: Body mass index is 26.12 kg/m. Clozapine on 10/19 is 1723,  701 on 10/27, 471 (therapeutic) on 10/06/23 EKG monitoring: QTc: 479 on 09/27/2023, 449 on 10/05/23   Clozapine level pending 11/4   CK level 11/6 was within acceptable limits   4. Group Therapy:             -- Encouraged patient to participate in unit milieu and in scheduled group therapies              -- Short Term Goals: Ability to identify changes in lifestyle to reduce recurrence of condition, verbalize feelings, identify and develop effective coping behaviors, maintain clinical measurements within normal limits, and identify triggers associated with substance abuse/mental health issues will improve. Improvement in ability to demonstrate self-control and comply with prescribed medications.             -- Long Term Goals: Improvement in symptoms so as ready for discharge -- Patient is encouraged to participate in group therapy while admitted to the psychiatric unit. -- We will address other chronic and acute stressors, which contributed to the patient's Schizoaffective disorder, depressive type (HCC) in order to reduce the risk of self-harm at discharge.   5. Discharge Planning:              -- Social work and case management to assist with discharge planning and identification of hospital follow-up needs prior to discharge             -- Estimated LOS: circa 11/9             -- Discharge Concerns: Need to establish a safety plan; Medication compliance and effectiveness             -- Discharge Goals: Return to the group home with outpatient referrals for mental health follow-up including medication management/psychotherapy   I certify that inpatient services furnished can reasonably be expected to improve the patient's condition.    Roselle Locus, MD 10/10/2023, 3:18 PM

## 2023-10-10 NOTE — Progress Notes (Signed)
Pt in day room with staff. Room lock out initiated. Pt ambulating with walker. Pt noted to be fidgety and anxious. Will monitor.

## 2023-10-10 NOTE — [facility] Group Notes (Signed)

## 2023-10-10 NOTE — Progress Notes (Signed)
1:1 Nursing note- Patient is observed lying in his bed resting but not asleep. Writer spoke with him 1:1 and he reports having had a good day and has been in the dayroom this morning. He was encouraged to come to dayroom tonight and he reported that he is fine listening to the music that staff has playing. He denies auditory or visual hallucinations. Writer informed him of his medications and he reported that he would take them when he comes up for snack. He smiled a few times during our conversation. Support given and safety is maintained with sitter for fall risk.

## 2023-10-10 NOTE — Progress Notes (Signed)
Pt remains with 1:1 staff. Pt was on unit this morning, returned to room at lunch time and has remained there in bed.

## 2023-10-10 NOTE — Progress Notes (Signed)
Nursing 1:1 note D:Pt observed sleeping in bed with eyes closed. RR even and unlabored. No distress noted. A: 1:1 observation continues for safety  R: pt remains safe  

## 2023-10-10 NOTE — Plan of Care (Signed)

## 2023-10-10 NOTE — BH IP Treatment Plan (Signed)
Interdisciplinary Treatment and Diagnostic Plan Update  10/10/2023 Time of Session: 9:55 AM (UPDATE)  Brady Hoffman MRN: [id number]  Principal Diagnosis: Schizoaffective disorder, depressive type (HCC)  Secondary Diagnoses: Principal Problem:   Schizoaffective disorder, depressive type (HCC)   Current Medications:  Current Facility-Administered Medications  Medication Dose Route Frequency Provider Last Rate Last Admin   acetaminophen (TYLENOL) tablet 650 mg  650 mg Oral Q6H PRN Dahlia Byes C, NP   650 mg at 10/01/23 0837   alum & mag hydroxide-simeth (MAALOX/MYLANTA) 200-200-20 MG/5ML suspension 30 mL  30 mL Oral Q4H PRN Dahlia Byes C, NP       amantadine (SYMMETREL) capsule 100 mg  100 mg Oral Daily Hill, Shelbie Hutching, MD   100 mg at 10/10/23 0175   aspirin chewable tablet 81 mg  81 mg Oral Daily Onuoha, Josephine C, NP   81 mg at 10/10/23 1025   atorvastatin (LIPITOR) tablet 20 mg  20 mg Oral QHS Onuoha, Josephine C, NP   20 mg at 10/09/23 2047   bismuth subsalicylate (PEPTO BISMOL) chewable tablet 524 mg  524 mg Oral Q3H PRN Augusto Gamble, MD       cloZAPine (CLOZARIL) tablet 100 mg  100 mg Oral Daily Hill, Shelbie Hutching, MD   100 mg at 10/10/23 8527   cloZAPine (CLOZARIL) tablet 200 mg  200 mg Oral QHS Hill, Shelbie Hutching, MD   200 mg at 10/09/23 2047   diphenhydrAMINE (BENADRYL) capsule 50 mg  50 mg Oral TID PRN Dahlia Byes C, NP   50 mg at 09/27/23 1024   Or   diphenhydrAMINE (BENADRYL) injection 50 mg  50 mg Intramuscular TID PRN Dahlia Byes C, NP   50 mg at 10/09/23 0943   diphenhydrAMINE (BENADRYL) capsule 50 mg  50 mg Oral QHS Attiah, Nadir, MD   50 mg at 10/09/23 2047   docusate sodium (COLACE) capsule 100 mg  100 mg Oral Daily Onuoha, Josephine C, NP   100 mg at 10/10/23 7824   FLUoxetine (PROZAC) capsule 40 mg  40 mg Oral Daily Hill, Shelbie Hutching, MD   40 mg at 10/10/23 2353   influenza vac split trivalent PF (FLULAVAL) injection 0.5 mL   0.5 mL Intramuscular Tomorrow-1000 Massengill, Harrold Donath, MD       levETIRAcetam (KEPPRA) tablet 500 mg  500 mg Oral BID Dahlia Byes C, NP   500 mg at 10/10/23 1623   levothyroxine (SYNTHROID) tablet 50 mcg  50 mcg Oral Q0600 Dahlia Byes C, NP   50 mcg at 10/10/23 0630   LORazepam (ATIVAN) tablet 2 mg  2 mg Oral TID PRN Dahlia Byes C, NP   2 mg at 09/27/23 1023   Or   LORazepam (ATIVAN) injection 2 mg  2 mg Intramuscular TID PRN Dahlia Byes C, NP   2 mg at 10/09/23 0941   LORazepam (ATIVAN) tablet 0.5 mg  0.5 mg Oral Q8H PRN Dahlia Byes C, NP   0.5 mg at 10/09/23 0826   LORazepam (ATIVAN) tablet 1 mg  1 mg Oral BID Abbott Pao, Nadir, MD   1 mg at 10/10/23 1623   mupirocin cream (BACTROBAN) 2 %   Topical Daily Massengill, Harrold Donath, MD   Given at 10/10/23 0835   ondansetron (ZOFRAN) tablet 8 mg  8 mg Oral Q8H PRN Augusto Gamble, MD       pantoprazole (PROTONIX) EC tablet 80 mg  80 mg Oral Daily Onuoha, Josephine C, NP   80 mg at 10/10/23 0832   polyethylene glycol (  MIRALAX / GLYCOLAX) packet 17 g  17 g Oral Daily PRN Augusto Gamble, MD       senna (SENOKOT) tablet 8.6 mg  1 tablet Oral QHS PRN Augusto Gamble, MD       PTA Medications: Medications Prior to Admission  Medication Sig Dispense Refill Last Dose   acetaminophen (TYLENOL) 500 MG tablet Take 500-1,000 mg by mouth every 4 (four) hours as needed (pain).      aspirin 81 MG chewable tablet Chew 1 tablet (81 mg total) by mouth daily.      atorvastatin (LIPITOR) 20 MG tablet Take 1 tablet (20 mg total) by mouth at bedtime. 30 tablet 1    clonazePAM (KLONOPIN) 0.5 MG tablet Take 1 tablet (0.5 mg total) by mouth at bedtime. (Patient not taking: Reported on 09/20/2023) 30 tablet 0    cloZAPine (CLOZARIL) 100 MG tablet Take 1 tablet (100 mg total) by mouth 2 (two) times daily. (Patient taking differently: Take 300 mg by mouth See admin instructions. Take 300mg  in the morning and 300mg  at bedtime) 60 tablet 0    docusate sodium (COLACE)  100 MG capsule Take 100 mg by mouth daily.       FLUoxetine (PROZAC) 40 MG capsule Take 40 mg by mouth 2 (two) times daily.      folic acid (FOLVITE) 400 MCG tablet Take 1,000 mcg by mouth daily.      levETIRAcetam (KEPPRA) 500 MG tablet Take 500 mg by mouth 2 (two) times daily.      levothyroxine (SYNTHROID, LEVOTHROID) 50 MCG tablet Take 1 tablet (50 mcg total) by mouth daily before breakfast. For underactive thyroid 30 tablet 0    LORazepam (ATIVAN) 1 MG tablet Take 1 mg by mouth 2 (two) times daily as needed.      omeprazole (PRILOSEC) 40 MG capsule Take 1 capsule (40 mg total) by mouth daily. 30 capsule 1    QUEtiapine (SEROQUEL) 100 MG tablet Take 100 mg by mouth at bedtime. (Patient not taking: Reported on 09/20/2023)       Patient Stressors: Medication change or noncompliance    Patient Strengths: Supportive family/friends   Treatment Modalities: Medication Management, Group therapy, Case management,  1 to 1 session with clinician, Psychoeducation, Recreational therapy.   Physician Treatment Plan for Primary Diagnosis: Schizoaffective disorder, depressive type (HCC) Long Term Goal(s): Improvement in symptoms so as ready for discharge   Short Term Goals: Ability to identify changes in lifestyle to reduce recurrence of condition will improve Ability to verbalize feelings will improve Ability to disclose and discuss suicidal ideas Ability to demonstrate self-control will improve Ability to identify and develop effective coping behaviors will improve Ability to maintain clinical measurements within normal limits will improve Compliance with prescribed medications will improve Ability to identify triggers associated with substance abuse/mental health issues will improve  Medication Management: Evaluate patient's response, side effects, and tolerance of medication regimen.  Therapeutic Interventions: 1 to 1 sessions, Unit Group sessions and Medication administration.  Evaluation  of Outcomes: Progressing  Physician Treatment Plan for Secondary Diagnosis: Principal Problem:   Schizoaffective disorder, depressive type (HCC)  Long Term Goal(s): Improvement in symptoms so as ready for discharge   Short Term Goals: Ability to identify changes in lifestyle to reduce recurrence of condition will improve Ability to verbalize feelings will improve Ability to disclose and discuss suicidal ideas Ability to demonstrate self-control will improve Ability to identify and develop effective coping behaviors will improve Ability to maintain clinical measurements within normal limits  will improve Compliance with prescribed medications will improve Ability to identify triggers associated with substance abuse/mental health issues will improve     Medication Management: Evaluate patient's response, side effects, and tolerance of medication regimen.  Therapeutic Interventions: 1 to 1 sessions, Unit Group sessions and Medication administration.  Evaluation of Outcomes: Progressing   RN Treatment Plan for Primary Diagnosis: Schizoaffective disorder, depressive type (HCC) Long Term Goal(s): Knowledge of disease and therapeutic regimen to maintain health will improve  Short Term Goals: Ability to remain free from injury will improve, Ability to verbalize frustration and anger appropriately will improve, Ability to participate in decision making will improve, Ability to verbalize feelings will improve, Ability to identify and develop effective coping behaviors will improve, and Compliance with prescribed medications will improve  Medication Management: RN will administer medications as ordered by provider, will assess and evaluate patient's response and provide education to patient for prescribed medication. RN will report any adverse and/or side effects to prescribing provider.  Therapeutic Interventions: 1 on 1 counseling sessions, Psychoeducation, Medication administration, Evaluate  responses to treatment, Monitor vital signs and CBGs as ordered, Perform/monitor CIWA, COWS, AIMS and Fall Risk screenings as ordered, Perform wound care treatments as ordered.  Evaluation of Outcomes: Progressing   LCSW Treatment Plan for Primary Diagnosis: Schizoaffective disorder, depressive type (HCC) Long Term Goal(s): Safe transition to appropriate next level of care at discharge, Engage patient in therapeutic group addressing interpersonal concerns.  Short Term Goals: Engage patient in aftercare planning with referrals and resources, Increase social support, Increase emotional regulation, Facilitate acceptance of mental health diagnosis and concerns, Identify triggers associated with mental health/substance abuse issues, and Increase skills for wellness and recovery  Therapeutic Interventions: Assess for all discharge needs, 1 to 1 time with Social worker, Explore available resources and support systems, Assess for adequacy in community support network, Educate family and significant other(s) on suicide prevention, Complete Psychosocial Assessment, Interpersonal group therapy.  Evaluation of Outcomes: Progressing   Progress in Treatment: Attending groups: No. Participating in groups: No. Taking medication as prescribed: Yes. Toleration medication: Yes. Family/Significant other contact made: Yes mom [name] and Roena Malady ( group home )  Patient understands diagnosis: No. Discussing patient identified problems/goals with staff: Yes. Medical problems stabilized or resolved: Yes. Denies suicidal/homicidal ideation: Yes. Issues/concerns per patient self-inventory: No.     New problem(s) identified: No, Describe:  none reported   New Short Term/Long Term Goal(s): medication stabilization, elimination of SI thoughts, development of comprehensive mental wellness plan.      Patient Goals:  "Get my medications right"   Discharge Plan or Barriers: Patient recently admitted. CSW will  continue to follow and assess for appropriate referrals and possible discharge planning.      Reason for Continuation of Hospitalization: Medication stabilization   Estimated Length of Stay: 2-3 DAYS   Last 3 [place] Suicide Severity Risk Score: Flowsheet Row Ad[facility] (Current) [facility] from 09/19/2023 in [facility] Department [facility]  C-SSRS RISK CATEGORY No Risk No Risk       Last PHQ 2/9 Scores:     No data to display          Scribe for Treatment Team: Beather Arbour 10/10/2023 4:41 PM

## 2023-10-11 DIAGNOSIS — F251 Schizoaffective disorder, depressive type: Secondary | ICD-10-CM | POA: Diagnosis not present

## 2023-10-11 NOTE — Progress Notes (Signed)
Patient is 1:1. He is A/O x 2. He ambulates with a walker. Patient is in his room sitting on the bed, with his assigned staff at his side. Respirations are even/unlabored. No falls noted or reported.

## 2023-10-11 NOTE — Progress Notes (Signed)
1:1 Nursing note- Patient is asleep with no distress noted. Patient is safe with sitter at bedside.

## 2023-10-11 NOTE — Plan of Care (Signed)
  Problem: Safety: Goal: Periods of time without injury will increase Outcome: Progressing   

## 2023-10-11 NOTE — Plan of Care (Signed)
Problem: Coping: Goal: Ability to verbalize frustrations and anger appropriately will improve Outcome: Progressing   Problem: Health Behavior/Discharge Planning: Goal: Compliance with treatment plan for underlying cause of condition will improve Outcome: Progressing   Problem: Safety: Goal: Periods of time without injury will increase Outcome: Progressing   Pt awake in bed conversing with assigned [facility] staff on safety checks. Tolerated dinner and fluids well. Took his evening medications without issues. Safety maintained on 1:1 observation without incident to note at this time.

## 2023-10-11 NOTE — Progress Notes (Signed)
Pt awake at medication window with assigned staff. Remains medication compliant without adverse drug reactions. Denies SI, HI, AVH and pain when assessed. Tolerated breakfast and fluids well. A & O to self, time, place and situation but remains concrete on interactions.Safety maintained on 1:1 observation without incident to note at this time. Emotional support, encouragement and reassurance offered.

## 2023-10-11 NOTE — [facility] Group Notes (Signed)
Adult Psychoeducational Group Note  Date:  10/11/2023 Time:  8:58 AM  Group Topic/Focus:  Goals Group:   The focus of this group is to help patients establish daily goals to achieve during treatment and discuss how the patient can incorporate goal setting into their daily lives to aide in recovery.  Participation Level:  Active  Participation Quality:  Appropriate  Affect:  Appropriate  Cognitive:  Appropriate  Insight: Appropriate  Engagement in Group:  Engaged  Modes of Intervention:  Discussion  Additional Comments: The patient engage in group and  set a goal for today.  Octavio Manns 10/11/2023, 8:58 AM

## 2023-10-11 NOTE — Progress Notes (Signed)
[facility] MD Progress Note  10/11/2023 2:00 PM Brady Hoffman  MRN:  [id number] Subjective:  Brady Hoffman was seen around lunch time in his room. He didn't go down to [employer]. He tends to isolate whenever he is able. He feels that he is doing "better" overall and the auditory hallucinations are less bothersome. He slept okay. He feels that he "ate a little too much" this morning. No thoughts of harm to self or others.   Principal Problem: Schizoaffective disorder, depressive type (HCC) Diagnosis: Principal Problem:   Schizoaffective disorder, depressive type (HCC)  Total Time spent with patient: 15 minutes  Past Psychiatric History: Previous Psych Diagnoses: Panic disorder with agoraphobia, history of seizures, history of alcohol use disorder, schizoaffective disorder Prior inpatient treatment: Was admitted to [facility] years ago Current/prior outpatient treatment: Yes with Dr. Jannifer Franklin Prior rehab hx: Denies Psychotherapy hx: Yes History of suicide: Denies History of homicide or aggression: Denies Psychiatric medication history: Patient has been on trial Seroquel, Prolixin, Effexor, and Haldol Psychiatric medication compliance history: Compliance Neuromodulation history: Denies Current Psychiatrist: Dr. Jannifer Franklin Current therapist: Dr. 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    Pervasive developmental disorder    Schizoaffective disorder    Seizures (HCC)     Past Surgical History:  Procedure Laterality Date   ESOPHAGOGASTRODUODENOSCOPY  11/01/2011   Procedure: ESOPHAGOGASTRODUODENOSCOPY (EGD);  Surgeon: Theda Belfast;  Location: [facility]COPY;  Service: Endoscopy;  Laterality: N/A;   MOLE REMOVAL     WISDOM TOOTH EXTRACTION     Family History:  Family History  Adopted: Yes    Family Psychiatric  History: Psych: Patient unsure Psych Rx: Patient unsure SA/HA: Patient unsure Substance use family hx: Patient unsure Social History:  Social History   Substance and Sexual Activity  Alcohol Use No     Social History   Substance and Sexual Activity  Drug Use No    Social History   Socioeconomic History   Marital status: Single    Spouse name: Not on file   Number of children: 0   Years of education: College   Highest education level: Not on file  Occupational History    Employer: OTHER  Tobacco Use   Smoking status: Never   Smokeless tobacco: Never  Substance and Sexual Activity   Alcohol use: No   Drug use: No   Sexual activity: Never  Other Topics Concern   Not on file  Social History Narrative   Patient lives in [facility].   Right handed.   Education  Two years of college.Caffeine Use: 2 cups daily  Coke cola.    Social Determinants of Health   Financial Resource Strain: Not on file  Food Insecurity: No Food Insecurity (09/21/2023)   Hunger Vital Sign    Worried About Running Out of Food in the Last Year: Never true    Ran Out of Food in the Last Year: Never true  Transportation Needs: No Transportation Needs (09/21/2023)   PRAPARE - Administrator, Civil Service (Medical): No    Lack of Transportation (Non-Medical): No  Physical Activity: Not on file  Stress: Not on file  Social Connections: Not on file   Additional Social History:   Childhood (bring,  raised, lives now, parents, siblings, schooling, education): Some college Abuse: Denies Marital Status: Denies Sexual orientation: Male from birth Children: No children Employment: Unemployed Peer Group: Denies peer group Housing: Lives at the group home Finances: Some financial difficulty Legal: Denies 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 [employer]    Sleep: Fair  Appetite:  Fair  Current Medications: Current Facility-Administered Medications   Medication Dose Route Frequency Provider Last Rate Last Admin   acetaminophen (TYLENOL) tablet 650 mg  650 mg Oral Q6H PRN Dahlia Byes C, NP   650 mg at 10/01/23 0837   alum & mag hydroxide-simeth (MAALOX/MYLANTA) 200-200-20 MG/5ML suspension 30 mL  30 mL Oral Q4H PRN Dahlia Byes C, NP       amantadine (SYMMETREL) capsule 100 mg  100 mg Oral Daily Allyn Bartelson, Shelbie Hutching, MD   100 mg at 10/11/23 1610   aspirin chewable tablet 81 mg  81 mg Oral Daily Onuoha, Josephine C, NP   81 mg at 10/11/23 0820   atorvastatin (LIPITOR) tablet 20 mg  20 mg Oral QHS Onuoha, Josephine C, NP   20 mg at 10/10/23 2045   bismuth subsalicylate (PEPTO BISMOL) chewable tablet 524 mg  524 mg Oral Q3H PRN Augusto Gamble, MD       cloZAPine (CLOZARIL) tablet 100 mg  100 mg Oral Daily Nelle Sayed, Shelbie Hutching, MD   100 mg at 10/11/23 0820   cloZAPine (CLOZARIL) tablet 200 mg  200 mg Oral QHS Asmar Brozek, Shelbie Hutching, MD   200 mg at 10/10/23 2045   diphenhydrAMINE (BENADRYL) capsule 50 mg  50 mg Oral TID PRN Dahlia Byes C, NP   50 mg at 09/27/23 1024   Or   diphenhydrAMINE (BENADRYL) injection 50 mg  50 mg Intramuscular TID PRN Dahlia Byes C, NP   50 mg at 10/09/23 0943   diphenhydrAMINE (BENADRYL) capsule 50 mg  50 mg Oral QHS Attiah, Nadir, MD   50 mg at 10/10/23 2045   docusate sodium (COLACE) capsule 100 mg  100 mg Oral Daily Onuoha, Josephine C, NP   100 mg at 10/11/23 0820   FLUoxetine (PROZAC) capsule 40 mg  40 mg Oral Daily Lanier Felty, Shelbie Hutching, MD   40 mg at 10/11/23 0820   influenza vac split trivalent PF (FLULAVAL) injection 0.5 mL  0.5 mL Intramuscular Tomorrow-1000 Massengill, Harrold Donath, MD       levETIRAcetam (KEPPRA) tablet 500 mg  500 mg Oral BID Dahlia Byes C, NP   500 mg at 10/11/23 9604   levothyroxine (SYNTHROID) tablet 50 mcg  50 mcg Oral Q0600 Dahlia Byes C, NP   50 mcg at 10/11/23 0616   LORazepam (ATIVAN) tablet 2 mg  2 mg Oral TID PRN Dahlia Byes C, NP   2 mg at 09/27/23  1023   Or   LORazepam (ATIVAN) injection 2 mg  2 mg Intramuscular TID PRN Dahlia Byes C, NP   2 mg at 10/09/23 0941   LORazepam (ATIVAN) tablet 0.5 mg  0.5 mg Oral Q8H PRN Dahlia Byes C, NP   0.5 mg at 10/09/23 0826   LORazepam (ATIVAN) tablet 1 mg  1 mg Oral BID Abbott Pao, Nadir, MD   1 mg at 10/11/23 1044   mupirocin cream (BACTROBAN) 2 %   Topical Daily Massengill, Harrold Donath, MD   1 Application at 10/11/23 0822   ondansetron (ZOFRAN) tablet 8 mg  8 mg Oral Q8H PRN Augusto Gamble, MD       pantoprazole (PROTONIX) EC tablet 80 mg  80 mg  Oral Daily Dahlia Byes C, NP   80 mg at 10/11/23 9604   polyethylene glycol (MIRALAX / GLYCOLAX) packet 17 g  17 g Oral Daily PRN Augusto Gamble, MD       senna (SENOKOT) tablet 8.6 mg  1 tablet Oral QHS PRN Augusto Gamble, MD        Lab Results: No results found for this or any previous visit (from the past 48 hour(s)).  Blood Alcohol level:  Lab Results  Component Value Date   ETH <10 09/19/2023   ETH <10 09/28/2019    Metabolic Disorder Labs: Lab Results  Component Value Date   HGBA1C 5.7 (H) 09/23/2023   MPG 116.89 09/23/2023   MPG 114 02/25/2014   No results found for: "PROLACTIN" Lab Results  Component Value Date   CHOL 144 07/03/2023   TRIG 152 (H) 07/03/2023   HDL 37 (L) 07/03/2023   CHOLHDL 3.9 07/03/2023   VLDL 28 10/20/2009   LDLCALC 82 07/03/2023   LDLCALC 59 10/02/2021    Physical Findings: AIMS: Facial and Oral Movements Muscles of Facial Expression: Mild Lips and Perioral Area: Minimal, may be extreme normal Jaw: Minimal, may be extreme normal Tongue: Minimal, may be extreme normal,Extremity Movements Upper (arms, wrists, hands, fingers): Mild Lower (legs, knees, ankles, toes): Mild, Trunk Movements Neck, shoulders, hips: Moderate, Global Judgements Severity of abnormal movements overall : Moderate Incapacitation due to abnormal movements: Moderate Patient's awareness of abnormal movements: Aware, moderate  distress, Dental Status Current problems with teeth and/or dentures?: No Does patient usually wear dentures?: No Edentia?: No  CIWA:    COWS:     Musculoskeletal: Strength & Muscle Tone: within normal limits Gait & Station: unsteady Patient leans: Front  Psychiatric Specialty Exam:  Presentation  General Appearance:  Casual  Eye Contact: Fair  Speech: Slow  Speech Volume: Normal  Handedness: -- (not assessed)   Mood and Affect  Mood: Anxious  Affect: Constricted   Thought Process  Thought Processes: Goal Directed  Descriptions of Associations:Loose  Orientation:Full (Time, Place and Person)  Thought Content:Rumination  History of Schizophrenia/Schizoaffective disorder:No data recorded Duration of Psychotic Symptoms:No data recorded Hallucinations:Hallucinations: Auditory Description of Auditory Hallucinations: "Better"  Ideas of Reference:Paranoia  Suicidal Thoughts:Suicidal Thoughts: No  Homicidal Thoughts:Homicidal Thoughts: No   Sensorium  Memory: Immediate Fair; Recent Fair  Judgment: Poor  Insight: Poor   Executive Functions  Concentration: Poor  Attention Span: Fair  Recall: Poor  Fund of Knowledge: Fair  Language: Fair   Psychomotor Activity  Psychomotor Activity: Psychomotor Activity: Tremor Extrapyramidal Side Effects (EPS): Parkinsonism   Assets  Assets: Social Support; Housing   Sleep  Sleep: Sleep: Fair    Physical Exam: Physical Exam Vitals and nursing note reviewed.  Constitutional:      Appearance: Normal appearance.  HENT:     Head: Normocephalic and atraumatic.  Eyes:     Extraocular Movements: Extraocular movements intact.  Pulmonary:     Effort: Pulmonary effort is normal.  Musculoskeletal:        General: Normal range of motion.     Cervical back: Normal range of motion.  Neurological:     Mental Status: He is alert.     Motor: Tremor present.  Psychiatric:        Mood and  Affect: Mood is anxious. Affect is flat.    Review of Systems  Gastrointestinal:  Negative for constipation and diarrhea.  Genitourinary:  Negative for dysuria.  Musculoskeletal:  Negative for myalgias.  Neurological:  Positive for tremors.   Blood pressure 120/85, pulse 95, temperature (!) 97.5 F (36.4 C), temperature source Oral, resp. rate 16, height 6\' 3"  (1.905 m), weight 94.8 kg, SpO2 99%. Body mass index is 26.12 kg/m.   Treatment Plan Summary: Daily contact with patient to assess and evaluate symptoms and progress in treatment and Medication management     ASSESSMENT: Brady Hoffman is a 57 year old Caucasian male with prior psychiatric history significant for schizoaffective disorder, seizure disorder, developmental delay, alcohol use disorder, who presents voluntarily [facility] from [facility] health ED at [facility] for evaluation for his mental health illness and frequent incidence of falls. After medical evaluation/stabilization & clearance, he was transferred to the [facility] for further psychiatric evaluation & treatments.   Patient was initially planned to be discharged 11/4, but appeared to have regression in obsessive behaviors/rituals and exacerbation of psychosis     PLAN: Safety and Monitoring:              -- Voluntary admission to inpatient psychiatric unit for safety, stabilization and treatment             -- Daily contact with patient to assess and evaluate symptoms and progress in treatment             -- Patient's case to be discussed in multi-disciplinary team meeting             -- Observation Level : 1:1 level of observation continued             -- Vital signs:  q12 hours             -- Precautions: suicide, elopement, and assault   2. Interventions (medications, psychoeducation, etc):  Clozapine regiment adjusted: Clozapine 100mg  PO QAM and 200mg  PO at bedtime  Repeat Ativan 1 mg IM as needed for onset of any new episodes   Continue Ativan 1 mg p.o. twice  daily for akathisia Continue Benadryl 50 mg at bedtime for concern for EPS symptoms  reduced fluoxetine 40 mg daily - this medication has been associated with restlessness at high doses Added amantadine 100mg  PO daily for the tremor Room lockout after breakfast until lunch to encourage some ambulation and interaction     -- removed haloperidol from agitation protocol due to worsening cholinergic symptoms -- medical management: d/c atropine at bedtime for droolingpt now having dry mouth, aspirin, atorvastatin, docusate sodium, levetiracetam, levothyroxine, and pantoprazole -- Patient does not need nicotine replacement   PRN medications for symptomatic management:              -- lorazepam 0.5 mg every 8 hours as needed for anxiety              -- continue acetaminophen 650 mg every 6 hours as needed for mild to moderate pain, fever, and headaches              -- continue bismuth subsalicylate 524 mg oral chewable tablet every 3 hours as needed for diarrhea / loose stools              -- continue senna 8.6 mg oral at bedtime and polyethylene glycol 17 g oral daily as needed for mild to moderate constipation              -- continue ondansetron 8 mg every 8 hours as needed for nausea or vomiting              -- continue aluminum-magnesium hydroxide + simethicone 30  mL every 4 hours as needed for heartburn or indigestion             -- As needed agitation protocol in-place   The risks/benefits/side-effects/alternatives to the above medication were discussed in detail with the patient and time was given for questions. The patient consents to medication trial. FDA black box warnings, if present, were discussed.   The patient is agreeable with the medication plan, as above. We will monitor the patient's response to pharmacologic treatment, and adjust medications as necessary.   3. Routine and other pertinent labs: Metabolic profile: UrineBMI: Body mass index is 26.12 kg/m. Clozapine on 10/19 is  1723, 701 on 10/27, 471 (therapeutic) on 10/06/23 EKG monitoring: QTc: 479 on 09/27/2023, 449 on 10/05/23   Clozapine level pending 11/4   CK level 11/6 was within acceptable limits   4. Group Therapy:             -- Encouraged patient to participate in unit milieu and in scheduled group therapies              -- Short Term Goals: Ability to identify changes in lifestyle to reduce recurrence of condition, verbalize feelings, identify and develop effective coping behaviors, maintain clinical measurements within normal limits, and identify triggers associated with substance abuse/mental health issues will improve. Improvement in ability to demonstrate self-control and comply with prescribed medications.             -- Long Term Goals: Improvement in symptoms so as ready for discharge -- Patient is encouraged to participate in group therapy while admitted to the psychiatric unit. -- We will address other chronic and acute stressors, which contributed to the patient's Schizoaffective disorder, depressive type (HCC) in order to reduce the risk of self-harm at discharge.   5. Discharge Planning:              -- Social work and case management to assist with discharge planning and identification of hospital follow-up needs prior to discharge             -- Estimated LOS: ?Monday             -- Discharge Concerns: Need to establish a safety plan; Medication compliance and effectiveness             -- Discharge Goals: Return to the group home with outpatient referrals for mental health follow-up including medication management/psychotherapy   I certify that inpatient services furnished can reasonably be expected to improve the patient's condition.    Roselle Locus, MD 10/11/2023, 2:00 PM

## 2023-10-11 NOTE — [facility] Group Notes (Signed)
Adult Psychoeducational Group Note  Date:  10/11/2023 Time:  10:05 AM  Group Topic/Focus:  Goals Group:   The focus of this group is to help patients establish daily goals to achieve during treatment and discuss how the patient can incorporate goal setting into their daily lives to aide in recovery.  Participation Level:  Active  Participation Quality:  Appropriate  Affect:  Appropriate  Cognitive:  Appropriate  Insight: Appropriate  Engagement in Group:  Engaged  Modes of Intervention:  Discussion  Additional Comments:  The patient engage in group  Octavio Manns 10/11/2023, 10:05 AM

## 2023-10-11 NOTE — Progress Notes (Signed)
Pt visible in bathroom at intervals this afternoon "I'm just trying to use the bathroom, I'm not going to do anything". Mother visited but left earlier, stated "He's in the bathroom Brady Hoffman. I'm scared he's going to do one of those episode again". However, pt was redirectable. Declined afternoon group despite multiple prompts. Safety maintained on 1:1 observation without incident to note at this time. Continued support, encouragement and reassurance offered.

## 2023-10-11 NOTE — Plan of Care (Signed)
  Problem: Education: Goal: Emotional status will improve Outcome: Progressing Goal: Mental status will improve Outcome: Progressing   Problem: Activity: Goal: Sleeping patterns will improve Outcome: Progressing   

## 2023-10-11 NOTE — Progress Notes (Signed)
1:1 Nursing note- Patient in bed asleep no distress noted. Safety maintained and 1:1 continues with sitter at bedside.

## 2023-10-11 NOTE — Group Note (Signed)
Date:  10/11/2023 Time:  8:46 PM  Group Topic/Focus:  Wrap-Up Group:   The focus of this group is to help patients review their daily goal of treatment and discuss progress on daily workbooks.    Participation Level:  Did Not Attend  Participation Quality:   N/A  Affect:   N/A  Cognitive:   N/A  Insight: None  Engagement in Group:   N/A  Modes of Intervention:   N/A  Additional Comments:  Patient did not attend wrap up group.   Kennieth Francois 10/11/2023, 8:46 PM

## 2023-10-12 DIAGNOSIS — F251 Schizoaffective disorder, depressive type: Secondary | ICD-10-CM | POA: Diagnosis not present

## 2023-10-12 LAB — CBC WITH DIFFERENTIAL/PLATELET
Abs Immature Granulocytes: 0.01 10*3/uL (ref 0.00–0.07)
Basophils Absolute: 0 10*3/uL (ref 0.0–0.1)
Basophils Relative: 1 %
Eosinophils Absolute: 0.1 10*3/uL (ref 0.0–0.5)
Eosinophils Relative: 2 %
HCT: 40.4 % (ref 39.0–52.0)
Hemoglobin: 13 g/dL (ref 13.0–17.0)
Immature Granulocytes: 0 %
Lymphocytes Relative: 27 %
Lymphs Abs: 1.6 10*3/uL (ref 0.7–4.0)
MCH: 30.9 pg (ref 26.0–34.0)
MCHC: 32.2 g/dL (ref 30.0–36.0)
MCV: 96 fL (ref 80.0–100.0)
Monocytes Absolute: 0.5 10*3/uL (ref 0.1–1.0)
Monocytes Relative: 9 %
Neutro Abs: 3.7 10*3/uL (ref 1.7–7.7)
Neutrophils Relative %: 61 %
Platelets: 129 10*3/uL — ABNORMAL LOW (ref 150–400)
RBC: 4.21 MIL/uL — ABNORMAL LOW (ref 4.22–5.81)
RDW: 12.5 % (ref 11.5–15.5)
WBC: 5.9 10*3/uL (ref 4.0–10.5)
nRBC: 0 % (ref 0.0–0.2)

## 2023-10-12 LAB — BASIC METABOLIC PANEL
Anion gap: 9 (ref 5–15)
BUN: 20 mg/dL (ref 6–20)
CO2: 26 mmol/L (ref 22–32)
Calcium: 8.6 mg/dL — ABNORMAL LOW (ref 8.9–10.3)
Chloride: 104 mmol/L (ref 98–111)
Creatinine, Ser: 1.36 mg/dL — ABNORMAL HIGH (ref 0.61–1.24)
GFR, Estimated: >60 mL/min (ref >60)
Glucose, Bld: 88 mg/dL (ref 70–99)
Potassium: 4.1 mmol/L (ref 3.5–5.1)
Sodium: 139 mmol/L (ref 135–145)

## 2023-10-12 LAB — MAGNESIUM: Magnesium: 2 mg/dL (ref 1.7–2.4)

## 2023-10-12 LAB — ALKALINE PHOSPHATASE: Alkaline Phosphatase: 121 U/L (ref 38–126)

## 2023-10-12 NOTE — Group Note (Signed)
Date:  10/12/2023 Time:  6:05 PM  Group Topic/Focus:  Building Self Esteem:   The Focus of this group is helping patients become aware of the effects of self-esteem on their lives, the things they and others do that enhance or undermine their self-esteem, seeing the relationship between their level of self-esteem and the choices they make and learning ways to enhance self-esteem. Rediscovering Joy:   The focus of this group is to explore various ways to relieve stress in a positive manner.    Participation Level:  Did Not Attend  Participation Quality:    Affect:    Cognitive:    Insight:   Engagement in Group:    Modes of Intervention:    Additional Comments:  Patient was encouraged but did not attend group.  Gardiner Barefoot 10/12/2023, 6:05 PM

## 2023-10-12 NOTE — Plan of Care (Signed)
  Problem: Activity: Goal: Interest or engagement in activities will improve Outcome: Progressing   Problem: Health Behavior/Discharge Planning: Goal: Compliance with treatment plan for underlying cause of condition will improve Outcome: Progressing   Problem: Physical Regulation: Goal: Ability to maintain clinical measurements within normal limits will improve Outcome: Progressing   Problem: Safety: Goal: Periods of time without injury will increase Outcome: Progressing  Awake in bed talking to self after dinner. Compliant with evening medications. Denies concerns at this time. 1:1 observation maintained with staff in attendance at all times.

## 2023-10-12 NOTE — Progress Notes (Signed)
Patient is 1:1. He is resting in his bed, respirations even/unlabored. NAD noted. His assigned staff at his bedside. No falls noted or reported. His walker is at his bedside for assistance in ambulation. Patient has on his yellow non skid socks fall reduction socks.Marland Kitchen

## 2023-10-12 NOTE — Progress Notes (Signed)
Patient is a 1:1. He is currently resting in his bed, eyes closed, his respirations are even/unlabored. Patient's assigned staff is at his bedside. No falls reported/noted. He denies SI/HI/AVH. Support/comfort measures given.

## 2023-10-12 NOTE — Progress Notes (Signed)
[facility] MD Progress Note  10/12/2023 12:52 PM Brady Hoffman  MRN:  [id number] Subjective:  Brady Hoffman was seen in his room on rounds. He feels that he is doing "fine" overall, and his AH are "fine" today. He slept okay but was up early. He is eating about regular. no thoughts of harm to self of others. He had an episode of behaviors/mannerisms this morning but it has since resolved.   Principal Problem: Schizoaffective disorder, depressive type (HCC) Diagnosis: Principal Problem:   Schizoaffective disorder, depressive type (HCC)  Total Time spent with patient: 15 minutes   Past Psychiatric History: Previous Psych Diagnoses: Panic disorder with agoraphobia, history of seizures, history of alcohol use disorder, schizoaffective disorder Prior inpatient treatment: Was admitted to [facility] 5 years ago Current/prior outpatient treatment: Yes with Dr. Jannifer Franklin Prior rehab hx: Denies Psychotherapy hx: Yes History of suicide: Denies History of homicide or aggression: Denies Psychiatric medication history: Patient has been on trial Seroquel, Prolixin, Effexor, and Haldol Psychiatric medication compliance history: Compliance Neuromodulation history: Denies Current Psychiatrist: Dr. Jannifer Franklin Current therapist: Dr. 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    Pervasive developmental disorder    Schizoaffective disorder    Seizures (HCC)     Past Surgical History:  Procedure Laterality Date   ESOPHAGOGASTRODUODENOSCOPY  11/01/2011   Procedure: ESOPHAGOGASTRODUODENOSCOPY (EGD);  Surgeon: Theda Belfast;  Location: [facility];  Service: Endoscopy;  Laterality: N/A;   MOLE REMOVAL     WISDOM TOOTH EXTRACTION     Family History:  Family History  Adopted: Yes   Family Psychiatric  History: see  H&P Social History:  Social History   Substance and Sexual Activity  Alcohol Use No     Social History   Substance and Sexual Activity  Drug Use No    Social History   Socioeconomic History   Marital status: Single    Spouse name: Not on file   Number of children: 0   Years of education: College   Highest education level: Not on file  Occupational History    Employer: OTHER  Tobacco Use   Smoking status: Never   Smokeless tobacco: Never  Substance and Sexual Activity   Alcohol use: No   Drug use: No   Sexual activity: Never  Other Topics Concern   Not on file  Social History Narrative   Patient lives in [facility].   Right handed.   Education  Two years of college.Caffeine Use: 2 cups daily  Coke cola.    Social Determinants of Health   Financial Resource Strain: Not on file  Food Insecurity: No Food Insecurity (09/21/2023)   Hunger Vital Sign    Worried About Running Out of Food in the Last Year: Never true    Ran Out of Food in the Last Year: Never true  Transportation Needs: No Transportation Needs (09/21/2023)   PRAPARE - Administrator, Civil Service (Medical): No    Lack of Transportation (Non-Medical): No  Physical Activity: Not on file  Stress: Not on file  Social Connections: Not on file   Additional Social History:    Childhood (bring, raised, lives now, parents, siblings, schooling, education): Some college Abuse: Denies Marital Status: Denies Sexual orientation: Male from  birth Children: No children Employment: Unemployed Peer Group: Denies peer group Housing: Lives at the group home Finances: Some financial difficulty Legal: Denies 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 [employer]    Sleep: Fair  Appetite:  Fair  Current Medications: Current Facility-Administered Medications  Medication Dose Route Frequency Provider Last Rate Last Admin   acetaminophen (TYLENOL) tablet 650 mg  650 mg Oral Q6H PRN Dahlia Byes C,  NP   650 mg at 10/01/23 0837   alum & mag hydroxide-simeth (MAALOX/MYLANTA) 200-200-20 MG/5ML suspension 30 mL  30 mL Oral Q4H PRN Dahlia Byes C, NP       amantadine (SYMMETREL) capsule 100 mg  100 mg Oral Daily [place], Shelbie Hutching, MD   100 mg at 10/12/23 2130   aspirin chewable tablet 81 mg  81 mg Oral Daily Onuoha, Josephine C, NP   81 mg at 10/12/23 8657   atorvastatin (LIPITOR) tablet 20 mg  20 mg Oral QHS Onuoha, Josephine C, NP   20 mg at 10/11/23 2032   bismuth subsalicylate (PEPTO BISMOL) chewable tablet 524 mg  524 mg Oral Q3H PRN Augusto Gamble, MD       cloZAPine (CLOZARIL) tablet 100 mg  100 mg Oral Daily Calliope Delangel, Shelbie Hutching, MD   100 mg at 10/12/23 8469   cloZAPine (CLOZARIL) tablet 200 mg  200 mg Oral QHS Demiana Crumbley, Shelbie Hutching, MD   200 mg at 10/11/23 2032   diphenhydrAMINE (BENADRYL) capsule 50 mg  50 mg Oral TID PRN Dahlia Byes C, NP   50 mg at 09/27/23 1024   Or   diphenhydrAMINE (BENADRYL) injection 50 mg  50 mg Intramuscular TID PRN Dahlia Byes C, NP   50 mg at 10/12/23 0838   diphenhydrAMINE (BENADRYL) capsule 50 mg  50 mg Oral QHS Attiah, Nadir, MD   50 mg at 10/11/23 2031   docusate sodium (COLACE) capsule 100 mg  100 mg Oral Daily Onuoha, Josephine C, NP   100 mg at 10/12/23 0838   FLUoxetine (PROZAC) capsule 40 mg  40 mg Oral Daily Kyah Buesing, Shelbie Hutching, MD   40 mg at 10/12/23 0838   influenza vac split trivalent PF (FLULAVAL) injection 0.5 mL  0.5 mL Intramuscular Tomorrow-1000 Massengill, Harrold Donath, MD       levETIRAcetam (KEPPRA) tablet 500 mg  500 mg Oral BID Dahlia Byes C, NP   500 mg at 10/12/23 0838   levothyroxine (SYNTHROID) tablet 50 mcg  50 mcg Oral Q0600 Dahlia Byes C, NP   50 mcg at 10/12/23 0615   LORazepam (ATIVAN) tablet 2 mg  2 mg Oral TID PRN Dahlia Byes C, NP   2 mg at 09/27/23 1023   Or   LORazepam (ATIVAN) injection 2 mg  2 mg Intramuscular TID PRN Dahlia Byes C, NP   2 mg at 10/12/23 0838   LORazepam (ATIVAN)  tablet 0.5 mg  0.5 mg Oral Q8H PRN Dahlia Byes C, NP   0.5 mg at 10/12/23 0707   LORazepam (ATIVAN) tablet 1 mg  1 mg Oral BID Abbott Pao, Nadir, MD   1 mg at 10/12/23 1100   mupirocin cream (BACTROBAN) 2 %   Topical Daily Massengill, Harrold Donath, MD   1 Application at 10/11/23 0822   ondansetron (ZOFRAN) tablet 8 mg  8 mg Oral Q8H PRN Augusto Gamble, MD       pantoprazole (PROTONIX) EC tablet 80 mg  80 mg Oral Daily Onuoha, Josephine C, NP   80 mg at 10/12/23 0839   polyethylene glycol (MIRALAX /  GLYCOLAX) packet 17 g  17 g Oral Daily PRN Augusto Gamble, MD       senna (SENOKOT) tablet 8.6 mg  1 tablet Oral QHS PRN Augusto Gamble, MD        Lab Results: No results found for this or any previous visit (from the past 48 hour(s)).  Blood Alcohol level:  Lab Results  Component Value Date   ETH <10 09/19/2023   ETH <10 09/28/2019    Metabolic Disorder Labs: Lab Results  Component Value Date   HGBA1C 5.7 (H) 09/23/2023   MPG 116.89 09/23/2023   MPG 114 02/25/2014   No results found for: "PROLACTIN" Lab Results  Component Value Date   CHOL 144 07/03/2023   TRIG 152 (H) 07/03/2023   HDL 37 (L) 07/03/2023   CHOLHDL 3.9 07/03/2023   VLDL 28 10/20/2009   LDLCALC 82 07/03/2023   LDLCALC 59 10/02/2021    Physical Findings: AIMS: Facial and Oral Movements Muscles of Facial Expression: Mild Lips and Perioral Area: Minimal, may be extreme normal Jaw: Minimal, may be extreme normal Tongue: Minimal, may be extreme normal,Extremity Movements Upper (arms, wrists, hands, fingers): Mild Lower (legs, knees, ankles, toes): Mild, Trunk Movements Neck, shoulders, hips: Moderate, Global Judgements Severity of abnormal movements overall : Moderate Incapacitation due to abnormal movements: Moderate Patient's awareness of abnormal movements: Aware, moderate distress, Dental Status Current problems with teeth and/or dentures?: No Does patient usually wear dentures?: No Edentia?: No  CIWA:    COWS:      Musculoskeletal: Strength & Muscle Tone: within normal limits Gait & Station: unsteady Patient leans: Front  Psychiatric Specialty Exam:  Presentation  General Appearance:  Casual  Eye Contact: Fair  Speech: Slow  Speech Volume: Normal  Handedness: -- (not assessed)   Mood and Affect  Mood: Anxious  Affect: Constricted   Thought Process  Thought Processes: Goal Directed  Descriptions of Associations:Loose  Orientation:Full (Time, Place and Person)  Thought Content:Rumination  History of Schizophrenia/Schizoaffective disorder:No data recorded Duration of Psychotic Symptoms:No data recorded Hallucinations:Hallucinations: Auditory Description of Auditory Hallucinations: "Better"  Ideas of Reference:Paranoia  Suicidal Thoughts:Suicidal Thoughts: No  Homicidal Thoughts:Homicidal Thoughts: No   Sensorium  Memory: Immediate Fair; Recent Fair  Judgment: Poor  Insight: Poor   Executive Functions  Concentration: Poor  Attention Span: Fair  Recall: Poor  Fund of Knowledge: Fair  Language: Fair   Psychomotor Activity  Psychomotor Activity:No data recorded  Assets  Assets: Social Support; Housing   Sleep  Sleep: Sleep: Fair     Physical Exam: Physical Exam Vitals and nursing note reviewed.  Constitutional:      Appearance: Normal appearance.  HENT:     Head: Normocephalic and atraumatic.  Eyes:     Extraocular Movements: Extraocular movements intact.  Pulmonary:     Effort: Pulmonary effort is normal.  Musculoskeletal:        General: Normal range of motion.     Cervical back: Normal range of motion.  Neurological:     Mental Status: He is alert.     Motor: Tremor present.  Psychiatric:        Mood and Affect: Mood is anxious. Affect is flat.      Review of Systems  Gastrointestinal:  Negative for constipation and diarrhea.  Genitourinary:  Negative for dysuria.  Musculoskeletal:  Negative for myalgias.   Neurological:  Positive for tremors.   Blood pressure 111/79, pulse 100, temperature 97.9 F (36.6 C), temperature source Oral, resp. rate 18, height 6'  3" (1.905 m), weight 94.8 kg, SpO2 98%. Body mass index is 26.12 kg/m.   Treatment Plan Summary: Daily contact with patient to assess and evaluate symptoms and progress in treatment and Medication management     ASSESSMENT: Andranik Grima is a 57 year old Caucasian male with prior psychiatric history significant for schizoaffective disorder, seizure disorder, developmental delay, alcohol use disorder, who presents voluntarily [facility] from [facility] health ED at [facility] for evaluation for his mental health illness and frequent incidence of falls. After medical evaluation/stabilization & clearance, he was transferred to the [facility] for further psychiatric evaluation & treatments.   Patient was initially planned to be discharged 11/4, but appeared to have regression in obsessive behaviors/rituals and exacerbation of psychosis     PLAN: Safety and Monitoring:              -- Voluntary admission to inpatient psychiatric unit for safety, stabilization and treatment             -- Daily contact with patient to assess and evaluate symptoms and progress in treatment             -- Patient's case to be discussed in multi-disciplinary team meeting             -- Observation Level : 1:1 level of observation continued             -- Vital signs:  q12 hours             -- Precautions: suicide, elopement, and assault   2. Interventions (medications, psychoeducation, etc):  Clozapine regiment adjusted: Clozapine 100mg  PO QAM and 200mg  PO at bedtime  Repeat Ativan 1 mg IM as needed for onset of any new episodes   Continue Ativan 1 mg p.o. twice daily for akathisia Continue Benadryl 50 mg at bedtime for concern for EPS symptoms  reduced fluoxetine 40 mg daily - this medication has been associated with restlessness at high doses Added amantadine 100mg   PO daily for the tremor Room lockout after breakfast until lunch to encourage some ambulation and interaction     -- removed haloperidol from agitation protocol due to worsening cholinergic symptoms -- medical management: d/c atropine at bedtime for droolingpt now having dry mouth, aspirin, atorvastatin, docusate sodium, levetiracetam, levothyroxine, and pantoprazole -- Patient does not need nicotine replacement   PRN medications for symptomatic management:              -- lorazepam 0.5 mg every 8 hours as needed for anxiety              -- continue acetaminophen 650 mg every 6 hours as needed for mild to moderate pain, fever, and headaches              -- continue bismuth subsalicylate 524 mg oral chewable tablet every 3 hours as needed for diarrhea / loose stools              -- continue senna 8.6 mg oral at bedtime and polyethylene glycol 17 g oral daily as needed for mild to moderate constipation              -- continue ondansetron 8 mg every 8 hours as needed for nausea or vomiting              -- continue aluminum-magnesium hydroxide + simethicone 30 mL every 4 hours as needed for heartburn or indigestion             -- As  needed agitation protocol in-place   The risks/benefits/side-effects/alternatives to the above medication were discussed in detail with the patient and time was given for questions. The patient consents to medication trial. FDA black box warnings, if present, were discussed.   The patient is agreeable with the medication plan, as above. We will monitor the patient's response to pharmacologic treatment, and adjust medications as necessary.   3. Routine and other pertinent labs: Metabolic profile: UrineBMI: Body mass index is 26.12 kg/m. Clozapine on 10/19 is 1723, 701 on 10/27, 471 (therapeutic) on 10/06/23 EKG monitoring: QTc: 479 on 09/27/2023, 449 on 10/05/23   Clozapine level pending 11/4   CK level 11/6 was within acceptable limits   4. Group Therapy:              -- Encouraged patient to participate in unit milieu and in scheduled group therapies              -- Short Term Goals: Ability to identify changes in lifestyle to reduce recurrence of condition, verbalize feelings, identify and develop effective coping behaviors, maintain clinical measurements within normal limits, and identify triggers associated with substance abuse/mental health issues will improve. Improvement in ability to demonstrate self-control and comply with prescribed medications.             -- Long Term Goals: Improvement in symptoms so as ready for discharge -- Patient is encouraged to participate in group therapy while admitted to the psychiatric unit. -- We will address other chronic and acute stressors, which contributed to the patient's Schizoaffective disorder, depressive type (HCC) in order to reduce the risk of self-harm at discharge.   5. Discharge Planning:              -- Social work and case management to assist with discharge planning and identification of hospital follow-up needs prior to discharge             -- Estimated LOS: ?Monday             -- Discharge Concerns: Need to establish a safety plan; Medication compliance and effectiveness             -- Discharge Goals: Return to the group home with outpatient referrals for mental health follow-up including medication management/psychotherapy   I certify that inpatient services furnished can reasonably be expected to improve the patient's condition  Roselle Locus, MD 10/12/2023, 12:52 PM

## 2023-10-12 NOTE — Progress Notes (Signed)
Awake in bed, calm at this time. Toileted earlier, tolerated fluids and lunch well. Visited with his mother as well. Compliant with scheduled medications. Denies concerns at this time. 1:1 observation maintained with staff in attendance at all times.

## 2023-10-12 NOTE — Progress Notes (Signed)
   10/11/23 2100  Charting Type  Charting Type Shift assessment  Safety Check Verification  Has the RN verified the 15 minute safety check completion? Yes  Neurological  Neuro (WDL) WDL  HEENT  HEENT (WDL) WDL  Respiratory  Respiratory (WDL) WDL  Respiratory Pattern Regular;Unlabored  Chest Assessment Chest expansion symmetrical  Cardiac  Cardiac (WDL) WDL  Vascular  Vascular (WDL) WDL  Integumentary  Integumentary (WDL) WDL  Braden Scale (Ages 8 and up)  Sensory Perceptions 4  Moisture 4  Activity 4  Mobility 3  Nutrition 3  Friction and Shear 3  Braden Scale Score 21  Braden Interventions  Braden Scale Interventions Skin care  Musculoskeletal  Musculoskeletal (WDL) X  Assistive Device Standard walker  Generalized Weakness Yes  Musculoskeletal Details  RLE Weakness  LLE Weakness  Gastrointestinal  Gastrointestinal (WDL) WDL  Last BM Date  10/10/23  GU Assessment  Genitourinary (WDL) WDL  Neurological  Level of Consciousness Alert

## 2023-10-12 NOTE — Progress Notes (Signed)
   10/12/23 2100  Psych Admission Type (Psych Patients Only)  Admission Status Voluntary  Psychosocial Assessment  Patient Complaints Anxiety  Eye Contact Avertive  Facial Expression Flat  Affect Appropriate to circumstance  Speech Logical/coherent  Interaction Minimal  Motor Activity Slow  Appearance/Hygiene Unremarkable  Behavior Characteristics Cooperative;Appropriate to situation  Mood Pleasant  Thought Process  Coherency Concrete thinking  Content WDL  Delusions None reported or observed  Perception WDL  Hallucination None reported or observed  Judgment Limited  Confusion None  Danger to Self  Current suicidal ideation? Denies  Danger to Others  Danger to Others None reported or observed

## 2023-10-12 NOTE — Progress Notes (Signed)
Pt observed in his room at the beginning of the shift, calm and cooperative. Pt came out during med pass. Took all his scheduled meds without issues, pt then sat in the day room for some time before going to bed. Pt is currently lying in his calmly, 1:1 maintained for safety, no fall reported or observed will continue to monitor.

## 2023-10-12 NOTE — [facility] Group Notes (Signed)
Adult Psychoeducational Group Note  Date:  10/12/2023 Time:  8:52 PM  Group Topic/Focus:  Wrap-Up Group:   The focus of this group is to help patients review their daily goal of treatment and discuss progress on daily workbooks.  Participation Level:  Did Not Attend  Christ Kick 10/12/2023, 8:52 PM

## 2023-10-12 NOTE — Progress Notes (Signed)
Pt received PRN agitation protocol Ativan 2 mg and Benadryl 50 mg both IM at 0838 due to increased restlessness, akathesia and hyperactive; stripping off his clothes, unable to sit still on his bed. Required multiple prompts for safety throughout this morning. Compliant with scheduled medications. Denies concerns at this time. 1:1 observation maintained with staff in attendance at all times.

## 2023-10-13 NOTE — Group Note (Signed)
Recreation Therapy Group Note   Group Topic:Coping Skills  Group Date: 10/13/2023 Start Time: 1020 End Time: 1110 Facilitators: Burman Bruington-McCall, LRT,CTRS Location: [address] Dayroom   Group Topic: Coping Skills  Goal Area(s) Addresses:  Patient will identify positive coping skill techniques. Patient will identify benefits of using coping skills post d/c.  Intervention: Worksheet, Group brain storming  Group Description:  Mind Map.  Patient was provided a blank template of a diagram with 32 blank boxes in a tiered system, branching from the center (similar to a bubble chart). LRT directed patients to label the middle of the diagram "Coping Skills". LRT and patients filled in the first 8 boxes together with instances (stress, anger, sadness, trauma, depression, low energy, manic and anxiety) in which coping skills could be used. Patients were to then come up with 3 effective techniques to address each identified area in the remaining boxes stemming from a particular stressor. When completed, LRT wrote patient responses on the board to allow patients to fill in their diagrams if they had any empty spaces.   Education:  Pharmacologist, Building control surveyor.   Education Outcome:  Acknowledges Education   Affect/Mood: N/A   Participation Level: Did not attend    Clinical Observations/Individualized Feedback:     Plan: Continue to engage patient in RT group sessions 2-3x/week.   Dorthia Tout-McCall, LRT,CTRS  10/13/2023 12:46 PM

## 2023-10-13 NOTE — Progress Notes (Signed)
1:1 Note:  Patient resting in his bed. No distress noted. 1:1 monitoring continues. Patient remains safe at this time.

## 2023-10-13 NOTE — Progress Notes (Signed)
1:1 Note: Patient lying in his bed. No distress noted. 1:1 monitoring continues. Patient remains safe at this time.

## 2023-10-13 NOTE — Progress Notes (Signed)
Pt is calm and cooperative this morning, pt reports goodnight sleep, pt asked if ready for discharge, pt stated he would rather stay a little longer with [place] here. No issues noted this morning, no fall witnessed, remain on 1:1 for safety, will continue to monitor.

## 2023-10-13 NOTE — Progress Notes (Signed)
Patient continues to repetitively sit and stand while taking his pants off. Patient noted to be agitated. PRN IM benadryl and ativan administered, per MAR. 1:1 monitoring continues. Patient remains safe at this time.

## 2023-10-13 NOTE — Plan of Care (Signed)
°  Problem: Education: °Goal: Emotional status will improve °Outcome: Progressing °Goal: Mental status will improve °Outcome: Progressing °Goal: Verbalization of understanding the information provided will improve °Outcome: Progressing °  °

## 2023-10-13 NOTE — Group Note (Signed)
[facility] LCSW Group Therapy Note   Group Date: 10/13/2023 Start Time: 1300 End Time: 1400   Type of Therapy/Topic:  Group Therapy:  Emotion Regulation  Participation Level:  Did Not Attend     Description of Group:    The purpose of this group is to assist patients in learning to regulate negative emotions and experience positive emotions. Patients will be guided to discuss ways in which they have been vulnerable to their negative emotions. These vulnerabilities will be juxtaposed with experiences of positive emotions or situations, and patients challenged to use positive emotions to combat negative ones. Special emphasis will be placed on coping with negative emotions in conflict situations, and patients will process healthy conflict resolution skills.  Therapeutic Goals: Patient will identify two positive emotions or experiences to reflect on in order to balance out negative emotions:  Patient will label two or more emotions that they find the most difficult to experience:  Patient will be able to demonstrate positive conflict resolution skills through discussion or role plays:   Summary of Patient Progress:   Did Not Attend     Therapeutic Modalities:   Cognitive Behavioral Therapy Feelings Identification Dialectical Behavioral Therapy   Isabella Bowens, LCSWA

## 2023-10-13 NOTE — Progress Notes (Signed)
1:1 Note: Patient in his room observed standing and sitting repetitively. Patient noted to be anxious . Scheduled medications administered, per MAR. 1:1 monitoring continues. Patient remains safe at this time.

## 2023-10-13 NOTE — [facility] Group Notes (Signed)
Adult Psychoeducational Group Note  Date:  10/13/2023 Time:  9:27 AM  Group Topic/Focus:  Goals Group:   The focus of this group is to help patients establish daily goals to achieve during treatment and discuss how the patient can incorporate goal setting into their daily lives to aide in recovery. Orientation:   The focus of this group is to educate the patient on the purpose and policies of crisis stabilization and provide a format to answer questions about their admission.  The group details unit policies and expectations of patients while admitted.  Participation Level:  Did Not Attend  Participation Quality:    Affect:    Cognitive:    Insight:   Engagement in Group:    Modes of Intervention:    Additional Comments:    Sheran Lawless 10/13/2023, 9:27 AM

## 2023-10-13 NOTE — Progress Notes (Signed)
[facility] MD Progress Note  10/13/2023 12:50 PM Brady Hoffman  MRN:  [id number]  Subjective: Patient was seen in his room during rounds.  He was stemming during the time of the exam, which staff report is one of his behaviors.  Specifically, he was sitting naked on the toilet and rocking.  He did not endorse suicidal or homicidal ideation.  He did not endorse auditory or visual hallucinations.  We discussed continuing the current medication regimen.  He does not endorse any new complaints or concerns.  Principal Problem: Schizoaffective disorder, depressive type (HCC) Diagnosis: Principal Problem:   Schizoaffective disorder, depressive type (HCC)  Total Time spent with patient: 15 minutes   Past Psychiatric History: Previous Psych Diagnoses: Panic disorder with agoraphobia, history of seizures, history of alcohol use disorder, schizoaffective disorder Prior inpatient treatment: Was admitted to [facility] 5 years ago Current/prior outpatient treatment: Yes with Dr. Jannifer Franklin Prior rehab hx: Denies Psychotherapy hx: Yes History of suicide: Denies History of homicide or aggression: Denies Psychiatric medication history: Patient has been on trial Seroquel, Prolixin, Effexor, and Haldol Psychiatric medication compliance history: Compliance Neuromodulation history: Denies Current Psychiatrist: Dr. Jannifer Franklin Current therapist: Dr. 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    Pervasive developmental disorder    Schizoaffective disorder    Seizures (HCC)     Past Surgical History:  Procedure Laterality Date   ESOPHAGOGASTRODUODENOSCOPY  11/01/2011   Procedure: ESOPHAGOGASTRODUODENOSCOPY (EGD);  Surgeon: Theda Belfast;  Location: [facility];  Service: Endoscopy;  Laterality: N/A;   MOLE  REMOVAL     WISDOM TOOTH EXTRACTION     Family History:  Family History  Adopted: Yes   Family Psychiatric  History: see H&P Social History:  Social History   Substance and Sexual Activity  Alcohol Use No     Social History   Substance and Sexual Activity  Drug Use No    Social History   Socioeconomic History   Marital status: Single    Spouse name: Not on file   Number of children: 0   Years of education: College   Highest education level: Not on file  Occupational History    Employer: OTHER  Tobacco Use   Smoking status: Never   Smokeless tobacco: Never  Substance and Sexual Activity   Alcohol use: No   Drug use: No   Sexual activity: Never  Other Topics Concern   Not on file  Social History Narrative   Patient lives in [facility].   Right handed.   Education  Two years of college.Caffeine Use: 2 cups daily  Coke cola.    Social Determinants of Health   Financial Resource Strain: Not on file  Food Insecurity: No Food Insecurity (09/21/2023)   Hunger Vital Sign    Worried About Running Out of Food in the Last Year: Never true    Ran Out of Food in the Last Year: Never true  Transportation Needs: No Transportation Needs (09/21/2023)   PRAPARE - Administrator, Civil Service (Medical): No    Lack of Transportation (Non-Medical): No  Physical Activity: Not on file  Stress: Not on file  Social Connections: Not on file   Additional Social History:    Childhood (bring, raised,  lives now, parents, siblings, schooling, education): Some college Abuse: Denies Marital Status: Denies Sexual orientation: Male from birth Children: No children Employment: Unemployed Peer Group: Denies peer group Housing: Lives at the group home Finances: Some financial difficulty Legal: Denies 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 [employer]    Sleep: Fair  Appetite:  Fair  Current Medications: Current Facility-Administered Medications  Medication Dose  Route Frequency Provider Last Rate Last Admin   acetaminophen (TYLENOL) tablet 650 mg  650 mg Oral Q6H PRN Dahlia Byes C, NP   650 mg at 10/01/23 0837   alum & mag hydroxide-simeth (MAALOX/MYLANTA) 200-200-20 MG/5ML suspension 30 mL  30 mL Oral Q4H PRN Dahlia Byes C, NP       amantadine (SYMMETREL) capsule 100 mg  100 mg Oral Daily Hill, Shelbie Hutching, MD   100 mg at 10/13/23 0820   aspirin chewable tablet 81 mg  81 mg Oral Daily Onuoha, Josephine C, NP   81 mg at 10/13/23 0819   atorvastatin (LIPITOR) tablet 20 mg  20 mg Oral QHS Onuoha, Josephine C, NP   20 mg at 10/12/23 2047   bismuth subsalicylate (PEPTO BISMOL) chewable tablet 524 mg  524 mg Oral Q3H PRN Augusto Gamble, MD       cloZAPine (CLOZARIL) tablet 100 mg  100 mg Oral Daily Hill, Shelbie Hutching, MD   100 mg at 10/13/23 0820   cloZAPine (CLOZARIL) tablet 200 mg  200 mg Oral QHS Hill, Shelbie Hutching, MD   200 mg at 10/12/23 2119   diphenhydrAMINE (BENADRYL) capsule 50 mg  50 mg Oral TID PRN Dahlia Byes C, NP   50 mg at 09/27/23 1024   Or   diphenhydrAMINE (BENADRYL) injection 50 mg  50 mg Intramuscular TID PRN Dahlia Byes C, NP   50 mg at 10/13/23 1123   diphenhydrAMINE (BENADRYL) capsule 50 mg  50 mg Oral QHS Attiah, Nadir, MD   50 mg at 10/12/23 2047   docusate sodium (COLACE) capsule 100 mg  100 mg Oral Daily Onuoha, Josephine C, NP   100 mg at 10/13/23 0819   FLUoxetine (PROZAC) capsule 40 mg  40 mg Oral Daily Hill, Shelbie Hutching, MD   40 mg at 10/13/23 0820   influenza vac split trivalent PF (FLULAVAL) injection 0.5 mL  0.5 mL Intramuscular Tomorrow-1000 Massengill, Harrold Donath, MD       levETIRAcetam (KEPPRA) tablet 500 mg  500 mg Oral BID Dahlia Byes C, NP   500 mg at 10/13/23 0819   levothyroxine (SYNTHROID) tablet 50 mcg  50 mcg Oral Q0600 Dahlia Byes C, NP   50 mcg at 10/13/23 7829   LORazepam (ATIVAN) tablet 2 mg  2 mg Oral TID PRN Dahlia Byes C, NP   2 mg at 09/27/23 1023   Or    LORazepam (ATIVAN) injection 2 mg  2 mg Intramuscular TID PRN Dahlia Byes C, NP   2 mg at 10/13/23 1124   LORazepam (ATIVAN) tablet 0.5 mg  0.5 mg Oral Q8H PRN Dahlia Byes C, NP   0.5 mg at 10/12/23 0707   LORazepam (ATIVAN) tablet 1 mg  1 mg Oral BID Abbott Pao, Nadir, MD   1 mg at 10/13/23 0948   mupirocin cream (BACTROBAN) 2 %   Topical Daily Massengill, Harrold Donath, MD   Given at 10/13/23 0820   ondansetron (ZOFRAN) tablet 8 mg  8 mg Oral Q8H PRN Augusto Gamble, MD       pantoprazole (PROTONIX) EC tablet 80 mg  80 mg Oral Daily  Earney Navy, NP   80 mg at 10/13/23 0819   polyethylene glycol (MIRALAX / GLYCOLAX) packet 17 g  17 g Oral Daily PRN Augusto Gamble, MD       senna Mancel Parsons) tablet 8.6 mg  1 tablet Oral QHS PRN Augusto Gamble, MD        Lab Results:  Results for orders placed or performed during the hospital encounter of 09/21/23 (from the past 48 hour(s))  Basic metabolic panel     Status: Abnormal   Collection Time: 10/12/23  6:22 PM  Result Value Ref Range   Sodium 139 135 - 145 mmol/L   Potassium 4.1 3.5 - 5.1 mmol/L   Chloride 104 98 - 111 mmol/L   CO2 26 22 - 32 mmol/L   Glucose, Bld 88 70 - 99 mg/dL    Comment: Glucose reference range applies only to samples taken after fasting for at least 8 hours.   BUN 20 6 - 20 mg/dL   Creatinine, Ser 2.13 (H) 0.61 - 1.24 mg/dL   Calcium 8.6 (L) 8.9 - 10.3 mg/dL   GFR, Estimated >08 >65 mL/min    Comment: (NOTE) Calculated using the CKD-EPI Creatinine Equation (2021)    Anion gap 9 5 - 15    Comment: Performed at [facility], [address] [place], [place] [address]  Magnesium     Status: None   Collection Time: 10/12/23  6:22 PM  Result Value Ref Range   Magnesium 2.0 1.7 - 2.4 mg/dL    Comment: Performed at [facility], [address] [place], [place] [address]  CBC with Differential/Platelet     Status: Abnormal   Collection Time: 10/12/23  6:22 PM  Result Value Ref Range    WBC 5.9 4.0 - 10.5 K/uL   RBC 4.21 (L) 4.22 - 5.81 MIL/uL   Hemoglobin 13.0 13.0 - 17.0 g/dL   HCT 84.1 32.4 - 40.1 %   MCV 96.0 80.0 - 100.0 fL   MCH 30.9 26.0 - 34.0 pg   MCHC 32.2 30.0 - 36.0 g/dL   RDW 02.7 25.3 - 66.4 %   Platelets 129 (L) 150 - 400 K/uL   nRBC 0.0 0.0 - 0.2 %   Neutrophils Relative % 61 %   Neutro Abs 3.7 1.7 - 7.7 K/uL   Lymphocytes Relative 27 %   Lymphs Abs 1.6 0.7 - 4.0 K/uL   Monocytes Relative 9 %   Monocytes Absolute 0.5 0.1 - 1.0 K/uL   Eosinophils Relative 2 %   Eosinophils Absolute 0.1 0.0 - 0.5 K/uL   Basophils Relative 1 %   Basophils Absolute 0.0 0.0 - 0.1 K/uL   Immature Granulocytes 0 %   Abs Immature Granulocytes 0.01 0.00 - 0.07 K/uL    Comment: Performed at [facility], [address], [place], [place] [address]  Alkaline phosphatase     Status: None   Collection Time: 10/12/23  6:22 PM  Result Value Ref Range   Alkaline Phosphatase 121 38 - 126 U/L    Comment: Performed at [facility], [address] [place], [place] [address]    Blood Alcohol level:  Lab Results  Component Value Date   [facility] <10 09/19/2023   ETH <10 09/28/2019    Metabolic Disorder Labs: Lab Results  Component Value Date   HGBA1C 5.7 (H) 09/23/2023   MPG 116.89 09/23/2023   MPG 114 02/25/2014   No results found for: "PROLACTIN" Lab  Results  Component Value Date   CHOL 144 07/03/2023   TRIG 152 (H) 07/03/2023   HDL 37 (L) 07/03/2023   CHOLHDL 3.9 07/03/2023   VLDL 28 10/20/2009   LDLCALC 82 07/03/2023   LDLCALC 59 10/02/2021    Physical Findings: AIMS: Facial and Oral Movements Muscles of Facial Expression: Mild Lips and Perioral Area: Minimal, may be extreme normal Jaw: Minimal, may be extreme normal Tongue: Minimal, may be extreme normal,Extremity Movements Upper (arms, wrists, hands, fingers): Mild Lower (legs, knees, ankles, toes): Mild, Trunk Movements Neck, shoulders, hips: Moderate, Global  Judgements Severity of abnormal movements overall : Moderate Incapacitation due to abnormal movements: Moderate Patient's awareness of abnormal movements: Aware, moderate distress, Dental Status Current problems with teeth and/or dentures?: No Does patient usually wear dentures?: No Edentia?: No  CIWA:    COWS:     Musculoskeletal: Strength & Muscle Tone: within normal limits Gait & Station: unsteady Patient leans: Front  Psychiatric Specialty Exam:  Presentation  General Appearance:  Appropriate for Environment  Eye Contact: Fair  Speech: Slow  Speech Volume: Normal  Handedness: Right   Mood and Affect  Mood: Anxious  Affect: Constricted   Thought Process  Thought Processes: Goal Directed  Descriptions of Associations:Intact  Orientation:Full (Time, Place and Person)  Thought Content:Rumination  History of Schizophrenia/Schizoaffective disorder:No data recorded Duration of Psychotic Symptoms:No data recorded Hallucinations:Hallucinations: None   Ideas of Reference:None  Suicidal Thoughts:Suicidal Thoughts: No   Homicidal Thoughts:Homicidal Thoughts: No    Sensorium  Memory: Immediate Fair; Recent Fair  Judgment: Poor  Insight: Poor   Executive Functions  Concentration: Fair  Attention Span: Fair  Recall: Poor  Fund of Knowledge: Fair  Language: Fair   Psychomotor Activity  Psychomotor Activity:Psychomotor Activity: Increased Extrapyramidal Side Effects (EPS): Parkinsonism AIMS Completed?: No   Assets  Assets: Social Support; Housing   Sleep  Sleep: Sleep: Good Number of Hours of Sleep: 8.5      Physical Exam: Physical Exam Vitals and nursing note reviewed.  Constitutional:      Appearance: Normal appearance.  HENT:     Head: Normocephalic and atraumatic.  Eyes:     Extraocular Movements: Extraocular movements intact.  Pulmonary:     Effort: Pulmonary effort is normal.  Musculoskeletal:         General: Normal range of motion.     Cervical back: Normal range of motion.  Neurological:     Mental Status: He is alert.     Motor: Tremor present.  Psychiatric:        Mood and Affect: Mood is anxious. Affect is flat.      Review of Systems  Gastrointestinal:  Negative for constipation and diarrhea.  Genitourinary:  Negative for dysuria.  Musculoskeletal:  Negative for myalgias.  Neurological:  Positive for tremors.   Blood pressure 110/74, pulse 96, temperature 97.6 F (36.4 C), temperature source Oral, resp. rate 18, height 6\' 3"  (1.905 m), weight 94.8 kg, SpO2 99%. Body mass index is 26.12 kg/m.   Treatment Plan Summary: Daily contact with patient to assess and evaluate symptoms and progress in treatment and Medication management     ASSESSMENT: Brady Hoffman is a 57 year old Caucasian male with prior psychiatric history significant for schizoaffective disorder, seizure disorder, developmental delay, alcohol use disorder, who presents voluntarily [facility] from [facility] health ED at [facility] for evaluation for his mental health illness and frequent incidence of falls. After medical evaluation/stabilization & clearance, he was transferred to the [facility] for further  psychiatric evaluation & treatments.   Patient was initially planned to be discharged 11/4, but appeared to have regression in obsessive behaviors/rituals and exacerbation of psychosis     PLAN: Safety and Monitoring:              -- Voluntary admission to inpatient psychiatric unit for safety, stabilization and treatment             -- Daily contact with patient to assess and evaluate symptoms and progress in treatment             -- Patient's case to be discussed in multi-disciplinary team meeting             -- Observation Level : 1:1 level of observation continued             -- Vital signs:  q12 hours             -- Precautions: suicide, elopement, and assault   2. Interventions (medications, psychoeducation,  etc):  Clozapine regiment adjusted: Clozapine 100mg  PO QAM and 200mg  PO at bedtime  Repeat Ativan 1 mg IM as needed for onset of any new episodes   Continue Ativan 1 mg p.o. twice daily for akathisia Continue Benadryl 50 mg at bedtime for concern for EPS symptoms  reduced fluoxetine 40 mg daily - this medication has been associated with restlessness at high doses Added amantadine 100mg  PO daily for the tremor Room lockout after breakfast until lunch to encourage some ambulation and interaction     -- removed haloperidol from agitation protocol due to worsening cholinergic symptoms -- medical management: d/c atropine at bedtime for droolingpt now having dry mouth, aspirin, atorvastatin, docusate sodium, levetiracetam, levothyroxine, and pantoprazole -- Patient does not need nicotine replacement   PRN medications for symptomatic management:              -- lorazepam 0.5 mg every 8 hours as needed for anxiety              -- continue acetaminophen 650 mg every 6 hours as needed for mild to moderate pain, fever, and headaches              -- continue bismuth subsalicylate 524 mg oral chewable tablet every 3 hours as needed for diarrhea / loose stools              -- continue senna 8.6 mg oral at bedtime and polyethylene glycol 17 g oral daily as needed for mild to moderate constipation              -- continue ondansetron 8 mg every 8 hours as needed for nausea or vomiting              -- continue aluminum-magnesium hydroxide + simethicone 30 mL every 4 hours as needed for heartburn or indigestion             -- As needed agitation protocol in-place   The risks/benefits/side-effects/alternatives to the above medication were discussed in detail with the patient and time was given for questions. The patient consents to medication trial. FDA black box warnings, if present, were discussed.   The patient is agreeable with the medication plan, as above. We will monitor the patient's response to  pharmacologic treatment, and adjust medications as necessary.   3. Routine and other pertinent labs: Metabolic profile: UrineBMI: Body mass index is 26.12 kg/m. Clozapine on 10/19 is 1723, 701 on 10/27, 471 (therapeutic) on 10/06/23 EKG monitoring: QTc: 479  on 09/27/2023, 449 on 10/05/23      4. Group Therapy:             -- Encouraged patient to participate in unit milieu and in scheduled group therapies              -- Short Term Goals: Ability to identify changes in lifestyle to reduce recurrence of condition, verbalize feelings, identify and develop effective coping behaviors, maintain clinical measurements within normal limits, and identify triggers associated with substance abuse/mental health issues will improve. Improvement in ability to demonstrate self-control and comply with prescribed medications.             -- Long Term Goals: Improvement in symptoms so as ready for discharge -- Patient is encouraged to participate in group therapy while admitted to the psychiatric unit. -- We will address other chronic and acute stressors, which contributed to the patient's Schizoaffective disorder, depressive type (HCC) in order to reduce the risk of self-harm at discharge.   5. Discharge Planning:              -- Social work and case management to assist with discharge planning and identification of hospital follow-up needs prior to discharge             -- Estimated LOS: 11/12             -- Discharge Concerns: Need to establish a safety plan; Medication compliance and effectiveness             -- Discharge Goals: Return to the group home with outpatient referrals for mental health follow-up including medication management/psychotherapy   I certify that inpatient services furnished can reasonably be expected to improve the patient's condition  Golda Acre, MD 10/13/2023, 12:50 PM

## 2023-10-13 NOTE — Progress Notes (Signed)
   10/13/23 0800  Psych Admission Type (Psych Patients Only)  Admission Status Voluntary  Psychosocial Assessment  Patient Complaints Anxiety  Eye Contact Brief  Facial Expression Flat  Affect Appropriate to circumstance  Speech Logical/coherent  Interaction Minimal  Motor Activity Slow  Appearance/Hygiene Unremarkable  Behavior Characteristics Cooperative;Appropriate to situation  Mood Pleasant  Thought Process  Coherency Concrete thinking  Content WDL  Delusions WDL  Perception Hallucinations  Hallucination Auditory  Judgment Limited  Confusion None  Danger to Self  Current suicidal ideation? Denies  Danger to Others  Danger to Others None reported or observed

## 2023-10-14 NOTE — Progress Notes (Signed)
[facility] MD Progress Note  10/14/2023 12:35 PM Brady Hoffman  MRN:  [id number]  Subjective:  Brady Hoffman was seen in his room during rounds.  He reported that his mood was "good" today.  He denies any new psychiatric or medical complaints.  His primary concerns today are anxiety and obsessive thoughts.  He received IM and Benadryl and Ativan for anxiety yesterday morning related to obsessive thoughts.  He reports that his appetite is good.  He reports that focus and concentration are adequate.  He denies issues with energy.  He reports adequate sleep.  He denies any medication side effects.  He denies suicidal ideations, homicidal ideations, auditory hallucinations, visual hallucinations, or delusions.  He has not been participating in groups and reports that he is anxious to be around other people.  I called the [facility] x 2 today, but was not able to get in touch with the group home manager.  Will attempt to coordinate with group home manager for potential discharge tomorrow.  Principal Problem: Schizoaffective disorder, depressive type (HCC) Diagnosis: Principal Problem:   Schizoaffective disorder, depressive type (HCC)  Total Time spent with patient: 35 minutes   Past Psychiatric History: Previous Psych Diagnoses: Panic disorder with agoraphobia, history of seizures, history of alcohol use disorder, schizoaffective disorder Prior inpatient treatment: Was admitted to [facility] 5 years ago Current/prior outpatient treatment: Yes with Dr. Jannifer Franklin Prior rehab hx: Denies Psychotherapy hx: Yes History of suicide: Denies History of homicide or aggression: Denies Psychiatric medication history: Patient has been on trial Seroquel, Prolixin, Effexor, and Haldol Psychiatric medication compliance history: Compliance Neuromodulation history: Denies Current Psychiatrist: Dr. Jannifer Franklin Current therapist: Dr. Jannifer Franklin  Past Medical History:  Past Medical History:  Diagnosis Date   Anemia  01/14/2014   Anxiety attack    Chronic kidney disease    Depression    H/O: GI bleed    Hyperlipidemia    Panic disorder with agoraphobia and severe panic attacks    Pervasive developmental disorder    Schizoaffective disorder    Seizures (HCC)     Past Surgical History:  Procedure Laterality Date   ESOPHAGOGASTRODUODENOSCOPY  11/01/2011   Procedure: ESOPHAGOGASTRODUODENOSCOPY (EGD);  Surgeon: Theda Belfast;  Location: [facility];  Service: Endoscopy;  Laterality: N/A;   MOLE REMOVAL     WISDOM TOOTH EXTRACTION     Family History:  Family History  Adopted: Yes   Family Psychiatric  History: see H&P Social History:  Social History   Substance and Sexual Activity  Alcohol Use No     Social History   Substance and Sexual Activity  Drug Use No    Social History   Socioeconomic History   Marital status: Single    Spouse name: Not on file   Number of children: 0   Years of education: College   Highest education level: Not on file  Occupational History    Employer: OTHER  Tobacco Use   Smoking status: Never   Smokeless tobacco: Never  Substance and Sexual Activity   Alcohol use: No   Drug use: No   Sexual activity: Never  Other Topics Concern   Not on file  Social History Narrative   Patient lives in [facility].   Right handed.   Education  Two years of college.Caffeine Use: 2 cups daily  Coke cola.    Social Determinants of Health   Financial Resource Strain: Not on file  Food Insecurity: No Food Insecurity (09/21/2023)   Hunger Vital Sign  Worried About Programme researcher, broadcasting/film/video in the Last Year: Never true    Ran Out of Food in the Last Year: Never true  Transportation Needs: No Transportation Needs (09/21/2023)   PRAPARE - Administrator, Civil Service (Medical): No    Lack of Transportation (Non-Medical): No  Physical Activity: Not on file  Stress: Not on file  Social Connections: Not on file   Additional Social History:     Childhood (bring, raised, lives now, parents, siblings, schooling, education): Some college Abuse: Denies Marital Status: Denies Sexual orientation: Male from birth Children: No children Employment: Unemployed Peer Group: Denies peer group Housing: Lives at the group home Finances: Some financial difficulty Legal: Denies 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 [employer]    Sleep: Fair  Appetite:  Fair  Current Medications: Current Facility-Administered Medications  Medication Dose Route Frequency Provider Last Rate Last Admin   acetaminophen (TYLENOL) tablet 650 mg  650 mg Oral Q6H PRN Dahlia Byes C, NP   650 mg at 10/01/23 0837   alum & mag hydroxide-simeth (MAALOX/MYLANTA) 200-200-20 MG/5ML suspension 30 mL  30 mL Oral Q4H PRN Dahlia Byes C, NP       amantadine (SYMMETREL) capsule 100 mg  100 mg Oral Daily Hill, Shelbie Hutching, MD   100 mg at 10/14/23 0753   aspirin chewable tablet 81 mg  81 mg Oral Daily Onuoha, Josephine C, NP   81 mg at 10/14/23 0753   atorvastatin (LIPITOR) tablet 20 mg  20 mg Oral QHS Onuoha, Josephine C, NP   20 mg at 10/13/23 2019   bismuth subsalicylate (PEPTO BISMOL) chewable tablet 524 mg  524 mg Oral Q3H PRN Augusto Gamble, MD       cloZAPine (CLOZARIL) tablet 100 mg  100 mg Oral Daily Hill, Shelbie Hutching, MD   100 mg at 10/14/23 0753   cloZAPine (CLOZARIL) tablet 200 mg  200 mg Oral QHS Hill, Shelbie Hutching, MD   200 mg at 10/13/23 2018   diphenhydrAMINE (BENADRYL) capsule 50 mg  50 mg Oral TID PRN Dahlia Byes C, NP   50 mg at 09/27/23 1024   Or   diphenhydrAMINE (BENADRYL) injection 50 mg  50 mg Intramuscular TID PRN Dahlia Byes C, NP   50 mg at 10/13/23 1123   diphenhydrAMINE (BENADRYL) capsule 50 mg  50 mg Oral QHS Attiah, Nadir, MD   50 mg at 10/13/23 2019   docusate sodium (COLACE) capsule 100 mg  100 mg Oral Daily Onuoha, Josephine C, NP   100 mg at 10/14/23 0753   FLUoxetine (PROZAC) capsule 40 mg  40 mg Oral Daily Hill, Shelbie Hutching, MD   40 mg at 10/14/23 0753   influenza vac split trivalent PF (FLULAVAL) injection 0.5 mL  0.5 mL Intramuscular Tomorrow-1000 Massengill, Harrold Donath, MD       levETIRAcetam (KEPPRA) tablet 500 mg  500 mg Oral BID Dahlia Byes C, NP   500 mg at 10/14/23 0753   levothyroxine (SYNTHROID) tablet 50 mcg  50 mcg Oral Q0600 Dahlia Byes C, NP   50 mcg at 10/14/23 0618   LORazepam (ATIVAN) tablet 2 mg  2 mg Oral TID PRN Dahlia Byes C, NP   2 mg at 09/27/23 1023   Or   LORazepam (ATIVAN) injection 2 mg  2 mg Intramuscular TID PRN Dahlia Byes C, NP   2 mg at 10/13/23 1124   LORazepam (ATIVAN) tablet 0.5 mg  0.5 mg Oral Q8H PRN Earney Navy, NP  0.5 mg at 10/12/23 0707   LORazepam (ATIVAN) tablet 1 mg  1 mg Oral BID Sarita Bottom, MD   1 mg at 10/14/23 1143   mupirocin cream (BACTROBAN) 2 %   Topical Daily Massengill, Harrold Donath, MD   Given at 10/14/23 0754   ondansetron (ZOFRAN) tablet 8 mg  8 mg Oral Q8H PRN Augusto Gamble, MD       pantoprazole (PROTONIX) EC tablet 80 mg  80 mg Oral Daily Dahlia Byes C, NP   80 mg at 10/14/23 0753   polyethylene glycol (MIRALAX / GLYCOLAX) packet 17 g  17 g Oral Daily PRN Augusto Gamble, MD       senna Mancel Parsons) tablet 8.6 mg  1 tablet Oral QHS PRN Augusto Gamble, MD        Lab Results:  Results for orders placed or performed during the hospital encounter of 09/21/23 (from the past 48 hour(s))  Basic metabolic panel     Status: Abnormal   Collection Time: 10/12/23  6:22 PM  Result Value Ref Range   Sodium 139 135 - 145 mmol/L   Potassium 4.1 3.5 - 5.1 mmol/L   Chloride 104 98 - 111 mmol/L   CO2 26 22 - 32 mmol/L   Glucose, Bld 88 70 - 99 mg/dL    Comment: Glucose reference range applies only to samples taken after fasting for at least 8 hours.   BUN 20 6 - 20 mg/dL   Creatinine, Ser 2.95 (H) 0.61 - 1.24 mg/dL   Calcium 8.6 (L) 8.9 - 10.3 mg/dL   GFR, Estimated >62 >13 mL/min    Comment: (NOTE) Calculated using the CKD-EPI Creatinine  Equation (2021)    Anion gap 9 5 - 15    Comment: Performed at [facility], [address] [place], [place] [address]  Magnesium     Status: None   Collection Time: 10/12/23  6:22 PM  Result Value Ref Range   Magnesium 2.0 1.7 - 2.4 mg/dL    Comment: Performed at [facility], [address], [place], [place] [address]  CBC with Differential/Platelet     Status: Abnormal   Collection Time: 10/12/23  6:22 PM  Result Value Ref Range   WBC 5.9 4.0 - 10.5 K/uL   RBC 4.21 (L) 4.22 - 5.81 MIL/uL   Hemoglobin 13.0 13.0 - 17.0 g/dL   HCT 29.5 28.4 - 13.2 %   MCV 96.0 80.0 - 100.0 fL   MCH 30.9 26.0 - 34.0 pg   MCHC 32.2 30.0 - 36.0 g/dL   RDW 44.0 10.2 - 72.5 %   Platelets 129 (L) 150 - 400 K/uL   nRBC 0.0 0.0 - 0.2 %   Neutrophils Relative % 61 %   Neutro Abs 3.7 1.7 - 7.7 K/uL   Lymphocytes Relative 27 %   Lymphs Abs 1.6 0.7 - 4.0 K/uL   Monocytes Relative 9 %   Monocytes Absolute 0.5 0.1 - 1.0 K/uL   Eosinophils Relative 2 %   Eosinophils Absolute 0.1 0.0 - 0.5 K/uL   Basophils Relative 1 %   Basophils Absolute 0.0 0.0 - 0.1 K/uL   Immature Granulocytes 0 %   Abs Immature Granulocytes 0.01 0.00 - 0.07 K/uL    Comment: Performed at [facility], [address], [place], [place] [address]  Alkaline phosphatase     Status: None   Collection Time: 10/12/23  6:22 PM  Result Value Ref Range   Alkaline Phosphatase 121  38 - 126 U/L    Comment: Performed at [facility], [address], [place], [place] [address]    Blood Alcohol level:  Lab Results  Component Value Date   [facility] <10 09/19/2023   ETH <10 09/28/2019    Metabolic Disorder Labs: Lab Results  Component Value Date   HGBA1C 5.7 (H) 09/23/2023   MPG 116.89 09/23/2023   MPG 114 02/25/2014   No results found for: "PROLACTIN" Lab Results  Component Value Date   CHOL 144 07/03/2023   TRIG 152 (H) 07/03/2023   HDL 37 (L) 07/03/2023    CHOLHDL 3.9 07/03/2023   VLDL 28 10/20/2009   LDLCALC 82 07/03/2023   LDLCALC 59 10/02/2021    Physical Findings: AIMS: Facial and Oral Movements Muscles of Facial Expression: Mild Lips and Perioral Area: Minimal, may be extreme normal Jaw: Minimal, may be extreme normal Tongue: Minimal, may be extreme normal,Extremity Movements Upper (arms, wrists, hands, fingers): Mild Lower (legs, knees, ankles, toes): Mild, Trunk Movements Neck, shoulders, hips: Moderate, Global Judgements Severity of abnormal movements overall : Moderate Incapacitation due to abnormal movements: Moderate Patient's awareness of abnormal movements: Aware, moderate distress, Dental Status Current problems with teeth and/or dentures?: No Does patient usually wear dentures?: No Edentia?: No  CIWA:    COWS:     Musculoskeletal: Strength & Muscle Tone: within normal limits Gait & Station: unsteady Patient leans: Front  Psychiatric Specialty Exam:  Presentation  General Appearance:  Appropriate for Environment  Eye Contact: Good  Speech: Slow  Speech Volume: Normal  Handedness: Right   Mood and Affect  Mood: Anxious  Affect: Restricted   Thought Process  Thought Processes: Linear  Descriptions of Associations:Intact  Orientation:Full (Time, Place and Person)  Thought Content:Rumination  History of Schizophrenia/Schizoaffective disorder:No data recorded Duration of Psychotic Symptoms:No data recorded Hallucinations:Hallucinations: None   Ideas of Reference:None  Suicidal Thoughts:Suicidal Thoughts: No   Homicidal Thoughts:Homicidal Thoughts: No    Sensorium  Memory: Immediate Fair; Recent Fair  Judgment: Fair  Insight: Fair   Art therapist  Concentration: Fair  Attention Span: Fair  Recall: Poor  Fund of Knowledge: Fair  Language: Fair   Psychomotor Activity  Psychomotor Activity:Psychomotor Activity: Normal Extrapyramidal Side Effects  (EPS): Parkinsonism AIMS Completed?: Yes   Assets  Assets: Communication Skills; Desire for Improvement; Housing   Sleep  Sleep: Sleep: Good Number of Hours of Sleep: 8.5      Physical Exam: Physical Exam Vitals and nursing note reviewed.  Constitutional:      Appearance: Normal appearance.  HENT:     Head: Normocephalic and atraumatic.  Eyes:     Extraocular Movements: Extraocular movements intact.  Pulmonary:     Effort: Pulmonary effort is normal.  Musculoskeletal:        General: Normal range of motion.     Cervical back: Normal range of motion.  Neurological:     Mental Status: He is alert.     Motor: Tremor present.  Psychiatric:        Mood and Affect: Mood is anxious. Affect is restricted.      Review of Systems  Gastrointestinal:  Negative for constipation and diarrhea.  Genitourinary:  Negative for dysuria.  Musculoskeletal:  Negative for myalgias.  Neurological:  Positive for tremors.   Blood pressure 117/82, pulse 98, temperature 98.3 F (36.8 C), temperature source Oral, resp. rate 18, height 6\' 3"  (1.905 m), weight 94.8 kg, SpO2 98%. Body mass index is 26.12 kg/m.   Treatment  Plan Summary: Daily contact with patient to assess and evaluate symptoms and progress in treatment and Medication management     ASSESSMENT: Brady Hoffman is a 57 year old Caucasian male with prior psychiatric history significant for schizoaffective disorder, seizure disorder, developmental delay, alcohol use disorder, who presents voluntarily [facility] from [facility] health ED at [facility] for evaluation for his mental health illness and frequent incidence of falls. After medical evaluation/stabilization & clearance, he was transferred to the [facility] for further psychiatric evaluation & treatments.   Patient was initially planned to be discharged 11/4, but appeared to have regression in obsessive behaviors/rituals and exacerbation of psychosis     PLAN: Safety and  Monitoring:              -- Voluntary admission to inpatient psychiatric unit for safety, stabilization and treatment             -- Daily contact with patient to assess and evaluate symptoms and progress in treatment             -- Patient's case to be discussed in multi-disciplinary team meeting             -- Observation Level : 1:1 level of observation continued             -- Vital signs:  q12 hours             -- Precautions: suicide, elopement, and assault   2. Interventions (medications, psychoeducation, etc):  Clozapine: Clozapine 100mg  PO QAM and 200mg  PO at bedtime  Repeat Ativan 1 mg PO as needed for onset of any new episodes   Continue Ativan 1 mg p.o. twice daily for akathisia Continue Benadryl 50 mg at bedtime for concern for EPS symptoms  reduced fluoxetine 40 mg daily - this medication has been associated with restlessness at high doses Added amantadine 100mg  PO daily for the tremor Room lockout after breakfast until lunch to encourage some ambulation and interaction     -- removed haloperidol from agitation protocol due to worsening cholinergic symptoms -- medical management: d/c atropine at bedtime for droolingpt now having dry mouth, aspirin, atorvastatin, docusate sodium, levetiracetam, levothyroxine, and pantoprazole -- Patient does not need nicotine replacement   PRN medications for symptomatic management:              -- lorazepam 0.5 mg every 8 hours as needed for anxiety              -- continue acetaminophen 650 mg every 6 hours as needed for mild to moderate pain, fever, and headaches              -- continue bismuth subsalicylate 524 mg oral chewable tablet every 3 hours as needed for diarrhea / loose stools              -- continue senna 8.6 mg oral at bedtime and polyethylene glycol 17 g oral daily as needed for mild to moderate constipation              -- continue ondansetron 8 mg every 8 hours as needed for nausea or vomiting              -- continue  aluminum-magnesium hydroxide + simethicone 30 mL every 4 hours as needed for heartburn or indigestion             -- As needed agitation protocol in-place   The risks/benefits/side-effects/alternatives to the above medication were discussed in detail with the  patient and time was given for questions. The patient consents to medication trial. FDA black box warnings, if present, were discussed.   The patient is agreeable with the medication plan, as above. We will monitor the patient's response to pharmacologic treatment, and adjust medications as necessary.   3. Routine and other pertinent labs: Metabolic profile: UrineBMI: Body mass index is 26.12 kg/m. Clozapine on 10/19 is 1723, 701 on 10/27, 471 (therapeutic) on 10/06/23 EKG monitoring: QTc: 479 on 09/27/2023, 449 on 10/05/23      4. Group Therapy:             -- Encouraged patient to participate in unit milieu and in scheduled group therapies              -- Short Term Goals: Ability to identify changes in lifestyle to reduce recurrence of condition, verbalize feelings, identify and develop effective coping behaviors, maintain clinical measurements within normal limits, and identify triggers associated with substance abuse/mental health issues will improve. Improvement in ability to demonstrate self-control and comply with prescribed medications.             -- Long Term Goals: Improvement in symptoms so as ready for discharge -- Patient is encouraged to participate in group therapy while admitted to the psychiatric unit. -- We will address other chronic and acute stressors, which contributed to the patient's Schizoaffective disorder, depressive type (HCC) in order to reduce the risk of self-harm at discharge.   5. Discharge Planning:              -- Social work and case management to assist with discharge planning and identification of hospital follow-up needs prior to discharge             -- Estimated LOS: 11/13             --  Discharge Concerns: Need to establish a safety plan; Medication compliance and effectiveness             -- Discharge Goals: Return to the group home with outpatient referrals for mental health follow-up including medication management/psychotherapy   I certify that inpatient services furnished can reasonably be expected to improve the patient's condition  Golda Acre, MD 10/14/2023, 12:35 PM

## 2023-10-14 NOTE — Progress Notes (Signed)
   10/14/23 0200  Psych Admission Type (Psych Patients Only)  Admission Status Voluntary  Psychosocial Assessment  Patient Complaints Anxiety  Eye Contact Brief  Facial Expression Flat  Affect Appropriate to circumstance  Speech Logical/coherent  Interaction Minimal  Motor Activity Slow  Appearance/Hygiene Unremarkable  Behavior Characteristics Cooperative;Appropriate to situation  Mood Pleasant  Thought Process  Coherency Concrete thinking  Content WDL  Delusions WDL  Perception Hallucinations  Hallucination Auditory  Judgment Limited  Confusion None  Danger to Self  Current suicidal ideation? Denies  Danger to Others  Danger to Others None reported or observed

## 2023-10-14 NOTE — Progress Notes (Signed)
1:1 Note: Patient in his room in bed. Patient came out, walked the hall, and spent time in the dayroom earlier in the morning. 1:1 monitoring continues. Patient remains safe at this time.

## 2023-10-14 NOTE — Plan of Care (Signed)
°  Problem: Education: °Goal: Emotional status will improve °Outcome: Progressing °Goal: Mental status will improve °Outcome: Progressing °Goal: Verbalization of understanding the information provided will improve °Outcome: Progressing °  °

## 2023-10-14 NOTE — Plan of Care (Signed)
  Problem: Education: Goal: Knowledge of [facility] Education information/materials will improve Outcome: Progressing Goal: Emotional status will improve Outcome: Progressing Goal: Mental status will improve Outcome: Progressing Goal: Verbalization of understanding the information provided will improve Outcome: Progressing   

## 2023-10-14 NOTE — Progress Notes (Signed)
Brady Hoffman did not attend wrap up group

## 2023-10-14 NOTE — Progress Notes (Signed)
   10/14/23 2200  Psych Admission Type (Psych Patients Only)  Admission Status Voluntary  Psychosocial Assessment  Patient Complaints Anxiety  Eye Contact Brief  Facial Expression Flat  Affect Appropriate to circumstance  Speech Logical/coherent  Interaction Minimal  Motor Activity Slow  Appearance/Hygiene Unremarkable  Behavior Characteristics Cooperative;Appropriate to situation  Mood Pleasant  Aggressive Behavior  Effect No apparent injury  Thought Process  Coherency Concrete thinking  Content WDL  Delusions WDL  Perception Hallucinations  Hallucination Auditory  Judgment Limited  Confusion None  Danger to Self  Current suicidal ideation? Denies  Danger to Others  Danger to Others None reported or observed

## 2023-10-14 NOTE — Progress Notes (Signed)
   10/14/23 0903  Psych Admission Type (Psych Patients Only)  Admission Status Voluntary  Psychosocial Assessment  Patient Complaints Anxiety  Eye Contact Brief  Facial Expression Flat  Affect Appropriate to circumstance  Speech Logical/coherent  Interaction Minimal  Motor Activity Slow  Appearance/Hygiene Unremarkable  Behavior Characteristics Cooperative;Appropriate to situation  Mood Pleasant  Thought Process  Coherency Concrete thinking  Content WDL  Delusions WDL  Perception Hallucinations  Hallucination Auditory  Judgment Limited  Confusion None  Danger to Self  Current suicidal ideation? Denies  Danger to Others  Danger to Others None reported or observed

## 2023-10-14 NOTE — Progress Notes (Signed)
1:1 Patient resting in his room. Patient shaved with MHT monitoring. No distress noted. Patient remains safe at this time.

## 2023-10-14 NOTE — Group Note (Signed)
Recreation Therapy Group Note   Group Topic:Health and Wellness  Group Date: 10/14/2023 Start Time: 1045 End Time: 1125 Facilitators: Loray Akard-McCall, LRT,CTRS Location: [address] Dayroom   Group Topic: Wellness  Goal Area(s) Addresses:  Patient will define components of whole wellness. Patient will verbalize benefit of whole wellness.  Group Description: Patients and LRT discussed the importance of physical health and how it co-insides with mental and spiritual health. Patients and LRT participated in a series of line dances to increase heart rate and get the muscles moving/stretched out.   Education: Wellness, Building control surveyor.   Education Outcome: Acknowledges education/In group clarification offered/Needs additional education.    Affect/Mood: N/A   Participation Level: Did not attend    Clinical Observations/Individualized Feedback:     Plan: Continue to engage patient in RT group sessions 2-3x/week.   Dajsha Massaro-McCall, LRT,CTRS 10/14/2023 12:29 PM

## 2023-10-15 ENCOUNTER — Encounter (HOSPITAL_COMMUNITY): Payer: Self-pay

## 2023-10-15 MED ORDER — CLOZAPINE 100 MG PO TABS: 100.0000 mg | ORAL_TABLET | Freq: Every day | ORAL | Status: DC

## 2023-10-15 MED ORDER — LORAZEPAM 0.5 MG PO TABS: 0.5000 mg | ORAL_TABLET | Freq: Three times a day (TID) | ORAL | Status: DC | PRN

## 2023-10-15 MED ORDER — LORAZEPAM 2 MG PO TABS: 2.0000 mg | ORAL_TABLET | Freq: Three times a day (TID) | ORAL | Status: DC | PRN

## 2023-10-15 MED ORDER — CLOZAPINE 200 MG PO TABS: 200.0000 mg | ORAL_TABLET | Freq: Every day | ORAL | Status: DC

## 2023-10-15 MED ORDER — LORAZEPAM 1 MG PO TABS: 1.0000 mg | ORAL_TABLET | Freq: Two times a day (BID) | ORAL | Status: DC

## 2023-10-15 MED ORDER — AMANTADINE HCL 100 MG PO CAPS: 100.0000 mg | ORAL_CAPSULE | Freq: Every day | ORAL | 0 refills | Status: DC

## 2023-10-15 NOTE — BH IP Treatment Plan (Signed)
Interdisciplinary Treatment and Diagnostic Plan Update  10/15/2023 Time of Session: 11:45AM - UPDATE Brady Hoffman MRN: [id number]  Principal Diagnosis: Schizoaffective disorder, depressive type (HCC)  Secondary Diagnoses: Principal Problem:   Schizoaffective disorder, depressive type (HCC)   Current Medications:  Current Facility-Administered Medications  Medication Dose Route Frequency Provider Last Rate Last Admin   acetaminophen (TYLENOL) tablet 650 mg  650 mg Oral Q6H PRN Dahlia Byes C, NP   650 mg at 10/01/23 0837   alum & mag hydroxide-simeth (MAALOX/MYLANTA) 200-200-20 MG/5ML suspension 30 mL  30 mL Oral Q4H PRN Dahlia Byes C, NP       amantadine (SYMMETREL) capsule 100 mg  100 mg Oral Daily Hill, Shelbie Hutching, MD   100 mg at 10/15/23 9563   aspirin chewable tablet 81 mg  81 mg Oral Daily Onuoha, Josephine C, NP   81 mg at 10/15/23 0837   atorvastatin (LIPITOR) tablet 20 mg  20 mg Oral QHS Onuoha, Josephine C, NP   20 mg at 10/14/23 2044   bismuth subsalicylate (PEPTO BISMOL) chewable tablet 524 mg  524 mg Oral Q3H PRN Augusto Gamble, MD       cloZAPine (CLOZARIL) tablet 100 mg  100 mg Oral Daily Hill, Shelbie Hutching, MD   100 mg at 10/15/23 0837   cloZAPine (CLOZARIL) tablet 200 mg  200 mg Oral QHS Hill, Shelbie Hutching, MD   200 mg at 10/14/23 2044   diphenhydrAMINE (BENADRYL) capsule 50 mg  50 mg Oral TID PRN Dahlia Byes C, NP   50 mg at 09/27/23 1024   diphenhydrAMINE (BENADRYL) capsule 50 mg  50 mg Oral QHS Attiah, Nadir, MD   50 mg at 10/14/23 2044   docusate sodium (COLACE) capsule 100 mg  100 mg Oral Daily Onuoha, Josephine C, NP   100 mg at 10/15/23 0837   FLUoxetine (PROZAC) capsule 40 mg  40 mg Oral Daily Hill, Shelbie Hutching, MD   40 mg at 10/15/23 0837   influenza vac split trivalent PF (FLULAVAL) injection 0.5 mL  0.5 mL Intramuscular Tomorrow-1000 Massengill, Harrold Donath, MD       levETIRAcetam (KEPPRA) tablet 500 mg  500 mg Oral BID Dahlia Byes C, NP   500 mg at 10/15/23 0837   levothyroxine (SYNTHROID) tablet 50 mcg  50 mcg Oral Q0600 Dahlia Byes C, NP   50 mcg at 10/15/23 0622   LORazepam (ATIVAN) tablet 0.5 mg  0.5 mg Oral Q8H PRN Dahlia Byes C, NP   0.5 mg at 10/12/23 0707   LORazepam (ATIVAN) tablet 1 mg  1 mg Oral BID Abbott Pao, Nadir, MD   1 mg at 10/15/23 1240   LORazepam (ATIVAN) tablet 2 mg  2 mg Oral TID PRN Dahlia Byes C, NP   2 mg at 09/27/23 1023   mupirocin cream (BACTROBAN) 2 %   Topical Daily Massengill, Harrold Donath, MD   Given at 10/15/23 0840   ondansetron (ZOFRAN) tablet 8 mg  8 mg Oral Q8H PRN Augusto Gamble, MD       pantoprazole (PROTONIX) EC tablet 80 mg  80 mg Oral Daily Onuoha, Josephine C, NP   80 mg at 10/15/23 0837   polyethylene glycol (MIRALAX / GLYCOLAX) packet 17 g  17 g Oral Daily PRN Augusto Gamble, MD       senna (SENOKOT) tablet 8.6 mg  1 tablet Oral QHS PRN Augusto Gamble, MD       PTA Medications: Medications Prior to Admission  Medication Sig Dispense Refill Last Dose  acetaminophen (TYLENOL) 500 MG tablet Take 500-1,000 mg by mouth every 4 (four) hours as needed (pain).      aspirin 81 MG chewable tablet Chew 1 tablet (81 mg total) by mouth daily.      atorvastatin (LIPITOR) 20 MG tablet Take 1 tablet (20 mg total) by mouth at bedtime. 30 tablet 1    clonazePAM (KLONOPIN) 0.5 MG tablet Take 1 tablet (0.5 mg total) by mouth at bedtime. (Patient not taking: Reported on 09/20/2023) 30 tablet 0    cloZAPine (CLOZARIL) 100 MG tablet Take 1 tablet (100 mg total) by mouth 2 (two) times daily. (Patient taking differently: Take 300 mg by mouth See admin instructions. Take 300mg  in the morning and 300mg  at bedtime) 60 tablet 0    docusate sodium (COLACE) 100 MG capsule Take 100 mg by mouth daily.       FLUoxetine (PROZAC) 40 MG capsule Take 40 mg by mouth 2 (two) times daily.      folic acid (FOLVITE) 400 MCG tablet Take 1,000 mcg by mouth daily.      levETIRAcetam (KEPPRA) 500 MG tablet Take  500 mg by mouth 2 (two) times daily.      levothyroxine (SYNTHROID, LEVOTHROID) 50 MCG tablet Take 1 tablet (50 mcg total) by mouth daily before breakfast. For underactive thyroid 30 tablet 0    LORazepam (ATIVAN) 1 MG tablet Take 1 mg by mouth 2 (two) times daily as needed.      omeprazole (PRILOSEC) 40 MG capsule Take 1 capsule (40 mg total) by mouth daily. 30 capsule 1    QUEtiapine (SEROQUEL) 100 MG tablet Take 100 mg by mouth at bedtime. (Patient not taking: Reported on 09/20/2023)       Patient Stressors: Medication change or noncompliance    Patient Strengths: Supportive family/friends   Treatment Modalities: Medication Management, Group therapy, Case management,  1 to 1 session with clinician, Psychoeducation, Recreational therapy.   Physician Treatment Plan for Primary Diagnosis: Schizoaffective disorder, depressive type (HCC) Long Term Goal(s): Improvement in symptoms so as ready for discharge   Short Term Goals: Ability to identify changes in lifestyle to reduce recurrence of condition will improve Ability to verbalize feelings will improve Ability to disclose and discuss suicidal ideas Ability to demonstrate self-control will improve Ability to identify and develop effective coping behaviors will improve Ability to maintain clinical measurements within normal limits will improve Compliance with prescribed medications will improve Ability to identify triggers associated with substance abuse/mental health issues will improve  Medication Management: Evaluate patient's response, side effects, and tolerance of medication regimen.  Therapeutic Interventions: 1 to 1 sessions, Unit Group sessions and Medication administration.  Evaluation of Outcomes: Progressing  Physician Treatment Plan for Secondary Diagnosis: Principal Problem:   Schizoaffective disorder, depressive type (HCC)  Long Term Goal(s): Improvement in symptoms so as ready for discharge   Short Term Goals:  Ability to identify changes in lifestyle to reduce recurrence of condition will improve Ability to verbalize feelings will improve Ability to disclose and discuss suicidal ideas Ability to demonstrate self-control will improve Ability to identify and develop effective coping behaviors will improve Ability to maintain clinical measurements within normal limits will improve Compliance with prescribed medications will improve Ability to identify triggers associated with substance abuse/mental health issues will improve     Medication Management: Evaluate patient's response, side effects, and tolerance of medication regimen.  Therapeutic Interventions: 1 to 1 sessions, Unit Group sessions and Medication administration.  Evaluation of Outcomes: Progressing  RN Treatment Plan for Primary Diagnosis: Schizoaffective disorder, depressive type (HCC) Long Term Goal(s): Knowledge of disease and therapeutic regimen to maintain health will improve  Short Term Goals: Ability to remain free from injury will improve, Ability to verbalize frustration and anger appropriately will improve, Ability to participate in decision making will improve, Ability to verbalize feelings will improve, Ability to identify and develop effective coping behaviors will improve, and Compliance with prescribed medications will improve  Medication Management: RN will administer medications as ordered by provider, will assess and evaluate patient's response and provide education to patient for prescribed medication. RN will report any adverse and/or side effects to prescribing provider.  Therapeutic Interventions: 1 on 1 counseling sessions, Psychoeducation, Medication administration, Evaluate responses to treatment, Monitor vital signs and CBGs as ordered, Perform/monitor CIWA, COWS, AIMS and Fall Risk screenings as ordered, Perform wound care treatments as ordered.  Evaluation of Outcomes: Progressing   LCSW Treatment Plan for  Primary Diagnosis: Schizoaffective disorder, depressive type (HCC) Long Term Goal(s): Safe transition to appropriate next level of care at discharge, Engage patient in therapeutic group addressing interpersonal concerns.  Short Term Goals: Engage patient in aftercare planning with referrals and resources, Increase social support, Increase emotional regulation, Facilitate acceptance of mental health diagnosis and concerns, Identify triggers associated with mental health/substance abuse issues, and Increase skills for wellness and recovery  Therapeutic Interventions: Assess for all discharge needs, 1 to 1 time with Social worker, Explore available resources and support systems, Assess for adequacy in community support network, Educate family and significant other(s) on suicide prevention, Complete Psychosocial Assessment, Interpersonal group therapy.  Evaluation of Outcomes: Progressing   Progress in Treatment: Attending groups: No. Participating in groups: No. Taking medication as prescribed: Yes. Toleration medication: Yes. Family/Significant other contact made: Yes mom [name] and Roena Malady ( group home )  Patient understands diagnosis: No. Discussing patient identified problems/goals with staff: Yes. Medical problems stabilized or resolved: Yes. Denies suicidal/homicidal ideation: Yes. Issues/concerns per patient self-inventory: No.     New problem(s) identified: No, Describe:  none reported   New Short Term/Long Term Goal(s): medication stabilization, elimination of SI thoughts, development of comprehensive mental wellness plan.      Patient Goals:  "Get my medications right"   Discharge Plan or Barriers: Patient recently admitted. CSW will continue to follow and assess for appropriate referrals and possible discharge planning.      Reason for Continuation of Hospitalization: Medication stabilization   Estimated Length of Stay: 1-2 DAYS  Last 3 [place] Suicide Severity Risk  Score: Flowsheet Row Ad[facility] (Current) [facility]19/2023 in [facility]  C-SSRS RISK CATEGORY No Risk No Risk       Last PHQ 2/9 Scores:     No data to display          Scribe for Treatment Team: Kathi Der, LCSWA 10/15/2023 1:32 PM

## 2023-10-15 NOTE — Plan of Care (Signed)
  Problem: Education: Goal: Knowledge of [facility] Education information/materials will improve Outcome: Progressing Goal: Emotional status will improve Outcome: Progressing Goal: Mental status will improve Outcome: Progressing Goal: Verbalization of understanding the information provided will improve Outcome: Progressing   

## 2023-10-15 NOTE — Progress Notes (Signed)
Brady Hoffman did not attend wrap up group

## 2023-10-15 NOTE — [facility] Group Notes (Signed)
Adult Psychoeducational Group Note  Date:  10/15/2023 Time:  9:35 AM  Group Topic/Focus:  Goals Group:   The focus of this group is to help patients establish daily goals to achieve during treatment and discuss how the patient can incorporate goal setting into their daily lives to aide in recovery.  Participation Level:  Active  Participation Quality:  Attentive  Affect:  Appropriate  Cognitive:  Appropriate  Insight: Appropriate  Engagement in Group:  Engaged  Modes of Intervention:  Orientation  Additional Comments:  Pt goal for today is to attend groups and stay out of his room  Dellia Nims 10/15/2023, 9:35 AM

## 2023-10-15 NOTE — Plan of Care (Signed)
Patient did not complete goal due to attending only one recreation therapy group session.  Jeanmarc Viernes-McCall, LRT,CTRS

## 2023-10-15 NOTE — Progress Notes (Signed)
Pt awake in bed, talking to self and appears to be in no physical distress at this time. Tolerated dinner, fluids and evening medications well. 1:1 maintained for safety as ordered without incident. Assigned sitter at bedside at all times.

## 2023-10-15 NOTE — Progress Notes (Signed)
[facility] LCSW Progress Note   10/15/2023    4:14 PM  Onalee Hua Tierno      Type of Note: Call with Ms Freida Busman    CSW spoke with Ms. Freida Busman who stated  that pt DC will need to be tomorrow because she has a lot of important things she has to get done. I shared that we has been calling and calling and her response was , " I have had a busy day like everyone else and I can send a car tomorrow because I need to go over the DC summary and paperwork. Also, I spoke with his mom and she said that he has no improved " . CSW shared info to providers and will continue to assist.     Signed:   Jacob Moores, MSW, [facility] 10/15/2023 4:14 PM

## 2023-10-15 NOTE — Group Note (Signed)
Recreation Therapy Group Note   Group Topic:Problem Solving  Group Date: 10/15/2023 Start Time: 1013 End Time: 1035 Facilitators: Kaelea Gathright-McCall, LRT,CTRS Location: 500 [address] Dayroom   Goal Area(s) Addresses:  Patient will effectively work with peer towards shared goal.  Patient will identify skills used to make activity successful.  Patient will identify how skills used during activity can be used to reach post d/c goals.   Intervention: STEM Activity  Group Description: Straw Bridge. In teams of 3-5, patients were given 15 plastic drinking straws and an equal length of masking tape. Using the materials provided, patients were instructed to build a free standing bridge-like structure to suspend an everyday item (ex: puzzle box) off of the floor or table surface. All materials were required to be used by the team in their design. LRT facilitated post-activity discussion reviewing team process. Patients were encouraged to reflect how the skills used in this activity can be generalized to daily life post discharge.   Education: Pharmacist, community, Scientist, physiological, Discharge Planning   Education Outcome: Acknowledges education/In group clarification offered/Needs additional education.    Affect/Mood: N/A   Participation Level: Did not attend    Clinical Observations/Individualized Feedback:     Plan: Continue to engage patient in RT group sessions 2-3x/week.   Yanice Maqueda-McCall, LRT,CTRS 10/15/2023 11:39 AM

## 2023-10-15 NOTE — Progress Notes (Signed)
Pt observed with increased restlessness, anxiety, repetitive, compulsive behavior of taking off his clothes, back and forth to the bathroom, flushing toilet at frequent intervals. Verbalized concerns about wanting to d/c today being a stressor "I want to go back to the home. I like it there better, I don't like it here" in conversation with mother during visitation. Pt not verbally redirectable at the time. Received his scheduled Ativan 1 mg PO at 1240. Pt observed to be calm, ambulatory with mom in hall, verbally redirectable but remained preoccupied about d/c process.  Pt tolerated lunch and fluids well. 1:1 maintained for safety as ordered without incident. Assigned sitter at bedside at all times.

## 2023-10-15 NOTE — Progress Notes (Signed)
Pt awake in bed on initial encounter. Denies SI, HI, AVH and pain when assessed "no, not right now" when assessed. Noted with flat affect but brightens up on interactions. Reported he slept well last night with good appetite. Attended scheduled orientation group with increased prompts. Actively participated in discussion, linear, logical during group. 1:1 maintained for safety as ordered without incident. Assigned sitter at bedside at all times.

## 2023-10-15 NOTE — Progress Notes (Signed)
[facility] LCSW Progress Note   10/15/2023    3:28 PM  Onalee Hua Hettich      Type of Note: Attempts made to Group Home    CSW reached out to Ms. Allen this morning around 10:15 am and again around 1:45 pm . CSW did leave two voicemail's. CSW will continue to assist     Signed:   Jacob Moores, MSW, [facility] 10/15/2023 3:28 PM

## 2023-10-15 NOTE — Discharge Summary (Signed)
Physician Discharge Summary Note  Patient:  Brady Hoffman is a 57 y.o. male  MRN:  [id number]  DOB:  1966/08/19  Patient phone: [phone] (home)  Patient address:   [address] [place] [address]   Total Time spent with patient: 49 Minutes  Date of Admission:  09/21/2023  Date of Discharge: 10/16/23   Reason for Admission:  Brady Hoffman is a 57 year old Caucasian male with prior psychiatric history significant for schizoaffective disorder, seizure disorder, developmental delay, alcohol use disorder, who presents voluntarily [facility] from [facility] health ED at [facility] for evaluation for his mental health illness and frequent incidence of falls.  After medical evaluation/stabilization & clearance, he was transferred to the [facility] for further psychiatric evaluation & treatments.   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  Previous Psych Diagnoses: Panic disorder with agoraphobia, history of seizures, history of alcohol use disorder, schizoaffective disorder Prior inpatient treatment: Was admitted to [facility] 5 years ago Current/prior outpatient treatment: Yes with Dr. Jannifer Franklin Prior rehab hx: Denies Psychotherapy hx: Yes History of suicide: Denies History of homicide or aggression: Denies Psychiatric medication history: Patient has been on trial Seroquel, Prolixin, Effexor, and Haldol Psychiatric medication compliance history: Compliance Neuromodulation history: Denies Current Psychiatrist: Dr. Jannifer Franklin Current therapist: Dr.  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    Pervasive developmental disorder    Schizoaffective disorder    Seizures (HCC)      Past Surgical History:  Procedure Laterality Date   ESOPHAGOGASTRODUODENOSCOPY  11/01/2011   Procedure: ESOPHAGOGASTRODUODENOSCOPY (EGD);  Surgeon: Theda Belfast;  Location: [facility] ENDOSCOPY;  Service: Endoscopy;  Laterality: N/A;   MOLE REMOVAL     WISDOM TOOTH EXTRACTION       Family History:  Family History  Adopted: Yes     Family Psychiatric  History: Adopted  Social History:  Social History   Substance and Sexual Activity  Alcohol Use No     Social History   Substance and Sexual Activity  Drug Use No     Social History   Socioeconomic History   Marital status: Single    Spouse name: Not on file   Number of children: 0   Years of education: College   Highest education level: Not on file  Occupational History    Employer: OTHER  Tobacco Use   Smoking status: Never   Smokeless tobacco: Never  Substance and Sexual Activity   Alcohol use: No   Drug use: No   Sexual activity: Never  Other Topics Concern   Not on file  Social History Narrative   Patient lives in [facility].   Right handed.   Education  Two years of college.Caffeine Use: 2 cups daily  Coke cola.    Social Determinants of Health   Financial Resource Strain: Not on file  Food Insecurity: No Food Insecurity (09/21/2023)   Hunger Vital Sign  Worried About Programme researcher, broadcasting/film/video in the Last Year: Never true    Ran Out of Food in the Last Year: Never true  Transportation Needs: No Transportation Needs (09/21/2023)   PRAPARE - Administrator, Civil Service (Medical): No    Lack of Transportation (Non-Medical): No  Physical Activity: Not  on file  Stress: Not on file  Social Connections: Not on file     Hospital Course:  During the patient's hospitalization, patient had extensive initial psychiatric evaluation, and follow-up psychiatric evaluations every day.  Psychiatric diagnoses provided upon initial assessment: Schizoaffective disorder, depressive type (HCC) [F25.1]   The patient was stabilized on a regimen of clozapine 100 mg daily and 200 mg nightly.  There was concern for akathisia so diphenhydramine was started at 50 mg nightly and amantadine was prescribed 100 mg daily.  Ativan was adjusted to 1 mg scheduled twice a day.  Ativan was also prescribed at half a milligram as needed for anxiety as well as a 2 mg as needed for agitation.  At the time of discharge there is no concern for issues with akathisia.    Patient's care was discussed during the interdisciplinary team meeting every day during the hospitalization.  The patient denied having side effects to prescribed psychiatric medication.  Gradually, patient started adjusting to milieu. The patient was evaluated each day by a clinical provider to ascertain response to treatment. Improvement was noted by the patient's report of decreasing symptoms, improved sleep and appetite, affect, medication tolerance, behavior, and participation in unit programming.  Patient was asked each day to complete a self inventory noting mood, mental status, pain, new symptoms, anxiety and concerns.    Symptoms were reported as significantly decreased by discharge.  The patient continued to exhibit anxious stemming behavior where he would take off his close and sit on the toilet and rock to self sooth.  He was having multiple episodes of this behavior daily which decreased to 0-1 times a day by the time of discharge.  The patient's mother noted concerns about the behavior; however, this is not a behavior that responded to medication and is not medically or psychiatrically dangerous.  On day  of discharge, the patient reports that their mood is stable. The patient denied having suicidal thoughts for more than 48 hours prior to discharge.  Patient denies having homicidal thoughts.  Patient denies having auditory hallucinations.  Patient denies any visual hallucinations or other symptoms of psychosis. The patient was motivated to continue taking medication with a goal of continued improvement in mental health.   The patient reports their target psychiatric symptoms of hallucinations and falls responded well to the psychiatric medications, and the patient reports overall benefit other psychiatric hospitalization. Supportive psychotherapy was provided to the patient. The patient also participated in regular group therapy while hospitalized. Coping skills, problem solving as well as relaxation therapies were also part of the unit programming.  Labs were reviewed with the patient, and abnormal results were discussed with the patient.  The patient is able to verbalize their individual safety plan to this provider.    Physical Findings:  AIMS:  Facial and Oral Movements: None Muscles of Facial Expression: None Lips and Perioral Area: None Jaw: None Tongue: None,Extremity Movements Upper (arms, wrists, hands, fingers): Mild Lower (legs, knees, ankles, toes): None, Trunk Movements Neck, shoulders, hips: None, Global Judgements Severity of abnormal movements overall: Mild Incapacitation due to abnormal movements: None Patient's awareness of abnormal movements: No Awareness, Dental Status  Current problems with teeth and/or dentures: No Does patient usually wear dentures: No Edentia: No   CIWA:   NA  COWS:  NA  Musculoskeletal: Strength & Muscle Tone: within normal limits Gait & Station: normal Patient leans: N/A    Psychiatric Specialty Exam:  Presentation  General Appearance: Appropriate for Environment  Eye Contact: Good  Speech: Normal Rate  Speech Volume:  Normal  Handedness: Right   Mood and Affect  Mood: Euthymic  Affect: Congruent   Thought Process  Thought Processes: Linear  Descriptions of Associations: Intact  Orientation: Full (Time, Place and Person)  Thought Content: Logical  History of Schizophrenia/Schizoaffective disorder: No data recorded Duration of Psychotic Symptoms: NA Hallucinations: Hallucinations: None  Ideas of Reference: None  Suicidal Thoughts: Suicidal Thoughts: No  Homicidal Thoughts: Homicidal Thoughts: No   Sensorium  Memory: Immediate Fair; Recent Fair  Judgment: Fair  Insight: Fair   Art therapist  Concentration: Fair  Attention Span: Good  Recall: Poor  Fund of Knowledge: Fair  Language: Fair   Psychomotor Activity  Psychomotor Activity: Psychomotor Activity: Normal AIMS Completed?: No   Assets  Assets: Desire for Improvement; Housing; Communication Skills   Sleep  Sleep: Sleep: Good      Physical Exam: General: Sitting comfortably. NAD. HEENT: Normocephalic, atraumatic, MMM, EMOI Lungs: no increased work of breathing noted Heart: no cyanosis Abdomen: Non distended Musculoskeletal: FROM. No obvious deformities Skin: Warm, dry, intact. No rashes noted Neuro: No obvious focal deficits.  Gait and station are normal  Review of Systems:  Constitutional: Negative.   HENT: Negative.    Eyes: Negative.   Respiratory: Negative.    Cardiovascular: Negative.   Gastrointestinal: Negative.   Genitourinary: Negative.   Skin: Negative.   Neurological: Negative.   Psychiatric/Behavioral:  Negative  Blood pressure 120/80, pulse 96, temperature 97.7 F (36.5 C), temperature source Oral, resp. rate 18, height 6\' 3"  (1.905 m), weight 94.8 kg, SpO2 99%. Body mass index is 26.12 kg/m.    Social History   Tobacco Use  Smoking Status Never  Smokeless Tobacco Never     Tobacco Cessation: Non-smoker A prescription for an FDA approved medication for tobacco  cessation was prescribed at discharge   Blood Alcohol level:  Lab Results  Component Value Date   [facility] <10 09/19/2023   ETH <10 09/28/2019    Metabolic Disorder Labs:  Lab Results  Component Value Date   HGBA1C 5.7 (H) 09/23/2023   MPG 116.89 09/23/2023   MPG 114 02/25/2014   No results found for: "PROLACTIN"  Lab Results  Component Value Date   CHOL 144 07/03/2023   TRIG 152 (H) 07/03/2023   HDL 37 (L) 07/03/2023   VLDL 28 10/20/2009   LDLCALC 82 07/03/2023   LDLCALC 59 10/02/2021      See Psychiatric Specialty Exam and Suicide Risk Assessment completed by Attending Physician prior to discharge.  Discharge destination: Group home  Is patient on multiple antipsychotic therapies at discharge:  No  Has Patient had three or more failed trials of antipsychotic monotherapy by history: NA Recommended Plan for Multiple Antipsychotic Therapies: NA   Discharge Instructions     Diet - low sodium heart healthy   Complete by: As directed    Increase activity slowly   Complete by: As directed    No wound care   Complete by: As directed         Allergies as of 10/16/2023       Reactions   Risperidone And Related  Other (See Comments)   Pt. States head feels like a rock        Medication List     STOP taking these medications    clonazePAM 0.5 MG tablet Commonly known as: KLONOPIN   folic acid 400 MCG tablet Commonly known as: FOLVITE   QUEtiapine 100 MG tablet Commonly known as: SEROQUEL       TAKE these medications      Indication  acetaminophen 500 MG tablet Commonly known as: TYLENOL Take 500-1,000 mg by mouth every 4 (four) hours as needed (pain).  Indication: Pain   amantadine 100 MG capsule Commonly known as: SYMMETREL Take 1 capsule (100 mg total) by mouth daily.  Indication: Extrapyramidal Reaction caused by Medications   aspirin 81 MG chewable tablet Chew 1 tablet (81 mg total) by mouth daily.  Indication: Pain   atorvastatin 20  MG tablet Commonly known as: LIPITOR Take 1 tablet (20 mg total) by mouth at bedtime.  Indication: High Amount of Triglycerides in the Blood   clozapine 200 MG tablet Commonly known as: CLOZARIL Take 1 tablet (200 mg total) by mouth at bedtime. What changed:  medication strength how much to take when to take this  Indication: Schizophrenia that does Not Respond to Usual Drug Therapy   cloZAPine 100 MG tablet Commonly known as: CLOZARIL Take 1 tablet (100 mg total) by mouth daily. What changed: You were already taking a medication with the same name, and this prescription was added. Make sure you understand how and when to take each.  Indication: Schizophrenia that does Not Respond to Usual Drug Therapy   docusate sodium 100 MG capsule Commonly known as: COLACE Take 100 mg by mouth daily.  Indication: Constipation   FLUoxetine 40 MG capsule Commonly known as: PROZAC Take 40 mg by mouth 2 (two) times daily.  Indication: Depression   levETIRAcetam 500 MG tablet Commonly known as: KEPPRA Take 500 mg by mouth 2 (two) times daily.  Indication: Seizure   levothyroxine 50 MCG tablet Commonly known as: SYNTHROID Take 1 tablet (50 mcg total) by mouth daily before breakfast. For underactive thyroid  Indication: Underactive Thyroid   LORazepam 1 MG tablet Commonly known as: ATIVAN Take 1 tablet (1 mg total) by mouth 2 (two) times daily. What changed:  when to take this reasons to take this  Indication: Feeling Anxious   LORazepam 2 MG tablet Commonly known as: ATIVAN Take 1 tablet (2 mg total) by mouth 3 (three) times daily as needed (agitation). What changed: You were already taking a medication with the same name, and this prescription was added. Make sure you understand how and when to take each.  Indication: Feeling Anxious   LORazepam 1 MG tablet Commonly known as: ATIVAN Take 0.5 tablets (0.5 mg total) by mouth every 8 (eight) hours as needed for anxiety. What  changed: You were already taking a medication with the same name, and this prescription was added. Make sure you understand how and when to take each.  Indication: Feeling Anxious   omeprazole 40 MG capsule Commonly known as: PRILOSEC Take 1 capsule (40 mg total) by mouth daily.  Indication: Indigestion          Follow-up Information     Thedore Mins, MD Follow up on 10/22/2023.   Specialty: Psychiatry Why: You have an appointment for medication management services on 10/22/23 at 2:20 pm, Virtual. Contact information: [address] [place] [address] [phone]  [place] Go on 10/17/2023.   Specialty: Professional Counselor Why: Please go to this provider for therapy services on Friday @ 9:00 AM to there walk in hours since you are a new patients: Walk-in hours are Monday through Friday, from 9 am to 1 pm. Contact information: [address] [place] [address] [phone]                    Follow-up recommendations:  - It is recommended to the patient to continue psychiatric medications as prescribed, after discharge from the hospital.   - It is recommended to the patient to follow up with your outpatient psychiatric provider and PCP. - It was discussed with the patient, the impact of alcohol, drugs, tobacco have been there overall psychiatric and medical wellbeing, and total abstinence from substance use was recommended the patient. - Prescriptions provided or sent directly to preferred pharmacy at discharge. Patient agreeable to plan. Given opportunity to ask questions. Appears to feel comfortable with discharge.   - In the event of worsening symptoms, the patient is instructed to call the crisis hotline, 911 and or go to the nearest ED for appropriate evaluation and treatment of symptoms. To follow-up with primary care provider for other medical issues, concerns and or health care needs - Patient was  discharged to his group home with a plan to follow up as noted below.   Comments:  NA  Signed: Criss Alvine, MD 10/16/23 12:24 PM

## 2023-10-15 NOTE — [facility] Suicide Risk Assessment (Signed)
[facility] Discharge Suicide Risk Assessment   Principal Problem: Schizoaffective disorder, depressive type [facility])  Discharge Diagnoses: Principal Problem:   Schizoaffective disorder, depressive type (HCC)      Total Time spent with patient: 40  Musculoskeletal: Strength & Muscle Tone: within normal limits Gait & Station: normal Patient leans: N/A   Psychiatric Specialty Exam:  Presentation  General Appearance: Appropriate for Environment  Eye Contact: Good  Speech: Slow  Speech Volume: Normal  Handedness: Right   Mood and Affect  Mood: Anxious  Affect: Restricted   Thought Process  Thought Processes: Linear  Descriptions of Associations: Intact  Orientation: Full (Time, Place and Person)  Thought Content: Rumination  History of Schizophrenia/Schizoaffective disorder: No data recorded Duration of Psychotic Symptoms: NA Hallucinations: Hallucinations: None  Ideas of Reference: None  Suicidal Thoughts: Suicidal Thoughts: No  Homicidal Thoughts: Homicidal Thoughts: No   Sensorium  Memory: Immediate Fair; Recent Fair  Judgment: Fair  Insight: Fair   Art therapist  Concentration: Fair  Attention Span: Fair  Recall: Poor  Fund of Knowledge: Fair  Language: Fair   Psychomotor Activity  Psychomotor Activity: Psychomotor Activity: Normal AIMS Completed?: Yes   Assets  Assets: Communication Skills; Desire for Improvement; Housing   Sleep  Sleep: Sleep: Good   Physical Exam: General: Sitting comfortably. NAD. HEENT: Normocephalic, atraumatic, MMM, EMOI Lungs: no increased work of breathing noted Heart: no cyanosis Abdomen: Non distended Musculoskeletal: FROM. No obvious deformities Skin: Warm, dry, intact. No rashes noted Neuro: No obvious focal deficits.  Gait and station are normal  Review of Systems  Constitutional: Negative.   HENT: Negative.    Eyes: Negative.   Respiratory: Negative.    Cardiovascular: Negative.    Gastrointestinal: Negative.   Genitourinary: Negative.   Skin: Negative.   Neurological: Negative.   Psychiatric/Behavioral:  Negative   Mental Status Per Nursing Assessment: NA  Demographic Factors:  Male and Unemployed  Loss Factors: NA  Historical Factors: NA  Risk Reduction Factors:   Religious beliefs about death and Positive therapeutic relationship  Continued Clinical Symptoms:  Previous psychiatric diagnoses and treatments  Cognitive Features That Contribute To Risk:  None  Suicide Risk:  Minimal: No identifiable suicidal ideation.  Patients presenting with no risk factors but with morbid ruminations; may be classified as minimal risk based on the severity of the depressive symptoms.    Follow-up Information     Thedore Mins, MD Follow up on 10/22/2023.   Specialty: Psychiatry Why: You have an appointment for medication management services on 10/22/23 at 2:20 pm, Virtual. Contact information: [address] [place] [address] [phone]         [place] Go to.   Specialty: Professional Counselor Why: Please go to this provider for therapy services, during walk in hours for new patients:  Monday through Friday, from 9 am to 1 pm. Contact information: [address] [place] [address] [phone]                  Plan Of Care/Follow-up recommendations:  Activity: as tolerated  Diet: heart healthy  Other: -Follow-up with your outpatient psychiatric provider -instructions on appointment date, time, and address (location) are provided to you in discharge paperwork.  -Take your psychiatric medications as prescribed at discharge - instructions are provided to you in the discharge paperwork  -Follow-up with outpatient primary care doctor and other specialists -for management of preventative medicine and chronic medical issues  -Testing: Follow-up with outpatient provider for  abnormal lab  results  -If you are prescribed an atypical antipsychotic medication, we recommend that your outpatient psychiatrist follow routine screening for side effects within 3 months of discharge, including monitoring: AIMS scale, height, weight, blood pressure, fasting lipid panel, HbA1c, and fasting blood sugar.   -Recommend total abstinence from alcohol, tobacco, and other illicit drug use at discharge.   -If your psychiatric symptoms recur, worsen, or if you have side effects to your psychiatric medications, call your outpatient psychiatric provider, 911, [phone] or go to the nearest emergency department.  -If suicidal thoughts occur, immediately call your outpatient psychiatric provider, 911, [phone] or go to the nearest emergency department.   Criss Alvine, MD 10/15/23 9:19 AM

## 2023-10-15 NOTE — Progress Notes (Signed)
Recreation Therapy Notes  INPATIENT RECREATION TR PLAN  Patient Details Name: Brady Hoffman MRN: [id number] DOB: [birth year] Today's Date: 10/15/2023  Rec Therapy Plan Is patient appropriate for Therapeutic Recreation?: Yes Treatment times per week: about 3 days Estimated Length of Stay: 5-7 days TR Treatment/Interventions: Group participation (Comment)  Discharge Criteria Pt will be discharged from therapy if:: Discharged Treatment plan/goals/alternatives discussed and agreed upon by:: Patient/family  Discharge Summary Short term goals set: See patient care plan Short term goals met: Not met Progress toward goals comments: Groups attended Which groups?: Other (Comment) (Decision Making) Reason goals not met: Pt attended one group session. Therapeutic equipment acquired: N/A Reason patient discharged from therapy: Discharge from hospital Pt/family agrees with progress & goals achieved: Yes Date patient discharged from therapy: 10/15/23   Rochell Mabie-McCall, LRT,CTRS Maija Biggers A Tamilyn Lupien-McCall 10/15/2023, 1:40 PM

## 2023-10-16 MED ORDER — LORAZEPAM 1 MG PO TABS: 1.0000 mg | ORAL_TABLET | Freq: Two times a day (BID) | ORAL | 0 refills | Status: AC

## 2023-10-16 MED ORDER — LORAZEPAM 1 MG PO TABS: 0.5000 mg | ORAL_TABLET | Freq: Three times a day (TID) | ORAL | 0 refills | Status: AC | PRN

## 2023-10-16 MED ORDER — CLOZAPINE 200 MG PO TABS: 200.0000 mg | ORAL_TABLET | Freq: Every day | ORAL | 0 refills | Status: AC

## 2023-10-16 MED ORDER — LORAZEPAM 2 MG PO TABS: 2.0000 mg | ORAL_TABLET | Freq: Three times a day (TID) | ORAL | 0 refills | Status: AC | PRN

## 2023-10-16 MED ORDER — AMANTADINE HCL 100 MG PO CAPS: 100.0000 mg | ORAL_CAPSULE | Freq: Every day | ORAL | 0 refills | Status: AC

## 2023-10-16 MED ORDER — CLOZAPINE 100 MG PO TABS: 100.0000 mg | ORAL_TABLET | Freq: Every day | ORAL | 0 refills | Status: AC

## 2023-10-16 NOTE — Progress Notes (Signed)
[facility] LCSW Progress Note   10/16/2023    11:22 AM  Deanne Coffer      Type of Note: DC planning   CSW spoke with mom this morning and asked if she would be taking patient back the group home and mother declined stating, " he is having another one of those up and down episodes, he is in bad shape, I cannot take him ;ike that . What if the group home does not accept him back because they cannot handle it ? " CSW did share with mom that Ms. Freida Busman was contacted this morning and shared that she would be sending a car to pick patient up and that some documents were sent to her, just not the DC summary or FL2 because doctor has to complete and sign. CSW did asked mom if the " Family care Home " had nurses or aids and mom stated, " I am unsure " . CSW then recommended a nursing facility or assisted living if she felt that the " family care home" is not able to assist medically on patient behalf / needs. Mom then requested to see the doctor. Doctor made aware. CSW will continue to assist.     Signed:   Jacob Moores, MSW, [facility] 10/16/2023 11:22 AM

## 2023-10-16 NOTE — [place] FL2 (Signed)
[place] MEDICAID FL2 LEVEL OF CARE FORM     IDENTIFICATION  Patient Name: Brady Hoffman Birthdate: 11/02/1966 Sex: male Admission Date (Current Location): 09/21/2023  [place] and [place] Number:   Haynes Bast and [id number]   Facility and Address:   [facility]  [address] [place] [address]      Provider Number:    Attending Physician Name and Address:  Golda Acre, MD [facility]  [address] 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 04/22/2014   Alcohol abuse 04/21/2014   Low back pain 03/18/2014   Encephalopathy 01/17/2014   Anemia 01/14/2014   Status epilepticus (HCC) 01/02/2014   Altered mental status 01/02/2014   Metabolic acidosis 01/02/2014   Acute respiratory failure (HCC) 01/02/2014   Schizoaffective disorder, depressive type (HCC) 10/31/2011   Acute renal failure (HCC) 10/31/2011   Dyslipidemia 10/31/2011    Orientation RESPIRATION BLADDER Height & Weight    Person, Time, Place      Normal   Continent  Weight: 94.8 kg Height:  6\' 3"  (190.5 cm)  BEHAVIORAL SYMPTOMS/MOOD NEUROLOGICAL BOWEL NUTRITION STATUS   None     Continent   Regular Diet   AMBULATORY STATUS COMMUNICATION OF NEEDS Skin   Ambulatory   Verbal    Normal                        Personal Care Assistance Level of Assistance     None , just monitor patient for possible falls          Functional  Limitations Info   None           SPECIAL CARE FACTORS FREQUENCY                       Contractures  None     Additional Factors Info                  Current Medications (10/16/2023):  This is the current hospital active medication list Current Facility-Administered Medications  Medication Dose Route Frequency Provider Last Rate Last Admin   acetaminophen (TYLENOL) tablet 650 mg  650 mg Oral Q6H PRN Dahlia Byes C, NP   650 mg at 10/01/23 0837   alum & mag hydroxide-simeth (MAALOX/MYLANTA) 200-200-20 MG/5ML suspension 30 mL  30 mL Oral Q4H PRN Dahlia Byes C, NP       amantadine (SYMMETREL) capsule 100 mg  100 mg Oral Daily Hill, Shelbie Hutching, MD   100 mg at 10/16/23 6433   aspirin chewable tablet 81 mg  81 mg Oral Daily Onuoha, Josephine C, NP   81 mg at 10/16/23 0836   atorvastatin (LIPITOR) tablet 20 mg  20 mg Oral QHS Onuoha, Josephine C, NP   20 mg at 10/15/23 2031   bismuth subsalicylate (PEPTO BISMOL) chewable tablet 524 mg  524 mg Oral  Q3H PRN Augusto Gamble, MD       cloZAPine (CLOZARIL) tablet 100 mg  100 mg Oral Daily Hill, Shelbie Hutching, MD   100 mg at 10/16/23 0834   cloZAPine (CLOZARIL) tablet 200 mg  200 mg Oral QHS Hill, Shelbie Hutching, MD   200 mg at 10/15/23 2031   diphenhydrAMINE (BENADRYL) capsule 50 mg  50 mg Oral TID PRN Dahlia Byes C, NP   50 mg at 09/27/23 1024   diphenhydrAMINE (BENADRYL) capsule 50 mg  50 mg Oral QHS Attiah, Nadir, MD   50 mg at 10/15/23 2030   docusate sodium (COLACE) capsule 100 mg  100 mg Oral Daily Onuoha, Josephine C, NP   100 mg at 10/16/23 0835   FLUoxetine (PROZAC) capsule 40 mg  40 mg Oral Daily Hill, Shelbie Hutching, MD   40 mg at 10/16/23 0835   influenza vac split trivalent PF (FLULAVAL) injection 0.5 mL  0.5 mL Intramuscular Tomorrow-1000 Massengill, Harrold Donath, MD       levETIRAcetam (KEPPRA) tablet 500 mg  500 mg Oral BID Dahlia Byes C, NP   500 mg at 10/16/23 0834   levothyroxine (SYNTHROID)  tablet 50 mcg  50 mcg Oral Q0600 Dahlia Byes C, NP   50 mcg at 10/16/23 3875   LORazepam (ATIVAN) tablet 0.5 mg  0.5 mg Oral Q8H PRN Dahlia Byes C, NP   0.5 mg at 10/12/23 0707   LORazepam (ATIVAN) tablet 1 mg  1 mg Oral BID Abbott Pao, Nadir, MD   1 mg at 10/15/23 1710   LORazepam (ATIVAN) tablet 2 mg  2 mg Oral TID PRN Dahlia Byes C, NP   2 mg at 09/27/23 1023   mupirocin cream (BACTROBAN) 2 %   Topical Daily Massengill, Harrold Donath, MD   Given at 10/16/23 0837   ondansetron (ZOFRAN) tablet 8 mg  8 mg Oral Q8H PRN Augusto Gamble, MD       pantoprazole (PROTONIX) EC tablet 80 mg  80 mg Oral Daily Onuoha, Josephine C, NP   80 mg at 10/16/23 0835   polyethylene glycol (MIRALAX / GLYCOLAX) packet 17 g  17 g Oral Daily PRN Augusto Gamble, MD       senna Mancel Parsons) tablet 8.6 mg  1 tablet Oral QHS PRN Augusto Gamble, MD         Discharge Medications: Please see discharge summary for a list of discharge medications.  Relevant Imaging Results:  Relevant Lab Results:   Additional Information    Isabella Bowens, [employer]

## 2023-10-16 NOTE — Progress Notes (Signed)
Pt discharged to lobby with mom. Pt was stable and appreciative at the time of discharge. All papers and prescriptions were given and valuables returned to patient/mother. Verbal understanding expressed. Denies SI/HI and A/VH. Pt given opportunity to express concerns and ask questions.  Writer verified discharge Ativan prescription with assigned provider prior to discharge.  Patient with no physical distress prior to discharge.

## 2023-10-16 NOTE — Plan of Care (Signed)
  Problem: Education: Goal: Knowledge of [facility] Education information/materials will improve Outcome: Progressing Goal: Emotional status will improve Outcome: Progressing Goal: Mental status will improve Outcome: Progressing Goal: Verbalization of understanding the information provided will improve Outcome: Progressing   

## 2023-10-16 NOTE — [facility] Group Notes (Addendum)
Pt was invited but did not attend nutrition group.

## 2023-10-16 NOTE — Group Note (Signed)
Recreation Therapy Group Note   Group Topic:Communication  Group Date: 10/16/2023 Start Time: 1000 End Time: 1025 Facilitators: Oneill Bais-McCall, LRT,CTRS Location: [address] Day[address]   Group Topic: Communication, Problem Solving   Goal Area(s) Addresses:  Patient will effectively listen to complete activity.  Patient will identify communication skills used to make activity successful.  Patient will identify how skills used during activity can be used to reach post d/c goals.    Intervention: Building surveyor Activity - Geometric pattern cards, pencils, blank paper    Group Description: Geometric Drawings.  Three volunteers from the peer group will be shown an abstract picture with a particular arrangement of geometrical shapes.  Each round, one 'speaker' will describe the pattern, as accurately as possible without revealing the image to the group.  The remaining group members will listen and draw the picture to reflect how it is described to them. Patients with the role of 'listener' cannot ask clarifying questions but, may request that the speaker repeat a direction. Once the drawings are complete, the presenter will show the rest of the group the picture and compare how close each person came to drawing the picture. LRT will facilitate a post-activity discussion regarding effective communication and the importance of planning, listening, and asking for clarification in daily interactions with others.  Education: Environmental consultant, Active listening, Support systems, Discharge planning  Education Outcome: Acknowledges understanding/In group clarification offered/Needs additional education.    Affect/Mood: N/A   Participation Level: Did not attend    Clinical Observations/Individualized Feedback:     Plan: Continue to engage patient in RT group sessions 2-3x/week.   Carlous Olivares-McCall, LRT,CTRS 10/16/2023 11:16 AM

## 2023-10-16 NOTE — B[facility] Group Notes (Signed)
Adult Psychoeducational Group Note  Date:  10/16/2023 Time:  9:58 AM  Group Topic/Focus:  Goals Group:   The focus of this group is to help patients establish daily goals to achieve during treatment and discuss how the patient can incorporate goal setting into their daily lives to aide in recovery.  Orientation:   The focus of this group is to educate the patient on the purpose and policies of crisis stabilization and provide a format to answer questions about their admission.  The group details unit policies and expectations of patients while admitted.   Participation Level:  Did Not Attend  Additional Comments:  Pt was invited but did not attend orientation/goals group  Dellia Nims 10/16/2023, 9:58 AM

## 2023-10-16 NOTE — Progress Notes (Signed)
[facility] MD Progress Note  10/16/2023 8:25 AM Brady Hoffman  MRN:  [id number]  Subjective:  Brady Hoffman was seen in his room during rounds.  He reported that his mood was "good" today.  He denies any new psychiatric or medical complaints.  His primary concerns today remain anxiety and obsessive thoughts.  He states that he is generally feeling well and ready to return to his group home.  Of note, his mother visited today and states that he is not at baseline.  He reports that his appetite is good.  He reports that focus and concentration are adequate.  He denies issues with energy.  He reports adequate sleep.  He denies any medication side effects.  He denies suicidal ideations, homicidal ideations, auditory hallucinations, visual hallucinations, or delusions.  He attended a group.  Treatment team believes that the patient is ready for DC, but he is not capable of living independently, and his GH is not responding to or returning my phone calls, or the LCSW's phone calls.  Will continue to work with [facility] to facilitate DC.  Principal Problem: Schizoaffective disorder, depressive type (HCC) Diagnosis: Principal Problem:   Schizoaffective disorder, depressive type (HCC)  Total Time spent with patient: 35 minutes   Past Psychiatric History: Previous Psych Diagnoses: Panic disorder with agoraphobia, history of seizures, history of alcohol use disorder, schizoaffective disorder Prior inpatient treatment: Was admitted to [facility] years ago Current/prior outpatient treatment: Yes with Dr. Jannifer Hoffman Prior rehab hx: Denies Psychotherapy hx: Yes History of suicide: Denies History of homicide or aggression: Denies Psychiatric medication history: Patient has been on trial Seroquel, Prolixin, Effexor, and Haldol Psychiatric medication compliance history: Compliance Neuromodulation history: Denies Current Psychiatrist: Dr. Jannifer Hoffman Current therapist: Dr. 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    Pervasive developmental disorder    Schizoaffective disorder    Seizures (HCC)     Past Surgical History:  Procedure Laterality Date   ESOPHAGOGASTRODUODENOSCOPY  11/01/2011   Procedure: ESOPHAGOGASTRODUODENOSCOPY (EGD);  Surgeon: Theda Belfast;  Location: [facility] ENDOSCOPY;  Service: Endoscopy;  Laterality: N/A;   MOLE REMOVAL     WISDOM TOOTH EXTRACTION     Family History:  Family History  Adopted: Yes   Family Psychiatric  History: see H&P Social History:  Social History   Substance and Sexual Activity  Alcohol Use No     Social History   Substance and Sexual Activity  Drug Use No    Social History   Socioeconomic History   Marital status: Single    Spouse name: Not on file   Number of children: 0   Years of education: College   Highest education level: Not on file  Occupational History    Employer: OTHER  Tobacco Use   Smoking status: Never   Smokeless tobacco: Never  Substance and Sexual Activity   Alcohol use: No   Drug use: No   Sexual activity: Never  Other Topics Concern   Not on file  Social History Narrative   Patient lives in [facility].   Right handed.   Education  Two years of college.Caffeine Use: 2 cups daily  Coke cola.    Social Determinants of Health   Financial Resource Strain: Not on file  Food Insecurity: No Food Insecurity (09/21/2023)   Hunger Vital Sign    Worried About Running Out of Food in the Last Year: Never true    Ran Out of Food in the Last Year: Never true  Transportation Needs: No Transportation Needs (09/21/2023)   PRAPARE - Administrator, Civil Service (Medical): No    Lack of Transportation (Non-Medical): No  Physical Activity: Not on file  Stress: Not on file  Social Connections: Not on file    Additional Social History:    Childhood (bring, raised, lives now, parents, siblings, schooling, education): Some college Abuse: Denies Marital Status: Denies Sexual orientation: Male from birth Children: No children Employment: Unemployed Peer Group: Denies peer group Housing: Lives at the group home Finances: Some financial difficulty Legal: Denies 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 [employer]    Sleep: Fair  Appetite:  Fair  Current Medications: Current Facility-Administered Medications  Medication Dose Route Frequency Provider Last Rate Last Admin   acetaminophen (TYLENOL) tablet 650 mg  650 mg Oral Q6H PRN Dahlia Byes C, NP   650 mg at 10/01/23 0837   alum & mag hydroxide-simeth (MAALOX/MYLANTA) 200-200-20 MG/5ML suspension 30 mL  30 mL Oral Q4H PRN Dahlia Byes C, NP       amantadine (SYMMETREL) capsule 100 mg  100 mg Oral Daily Hill, Shelbie Hutching, MD   100 mg at 10/15/23 4540   aspirin chewable tablet 81 mg  81 mg Oral Daily Onuoha, Josephine C, NP   81 mg at 10/15/23 0837   atorvastatin (LIPITOR) tablet 20 mg  20 mg Oral QHS Onuoha, Josephine C, NP   20 mg at 10/15/23 2031   bismuth subsalicylate (PEPTO BISMOL) chewable tablet 524 mg  524 mg Oral Q3H PRN Augusto Gamble, MD       cloZAPine (CLOZARIL) tablet 100 mg  100 mg Oral Daily Hill, Shelbie Hutching, MD   100 mg at 10/15/23 0837   cloZAPine (CLOZARIL) tablet 200 mg  200 mg Oral QHS Hill, Shelbie Hutching, MD   200 mg at 10/15/23 2031   diphenhydrAMINE (BENADRYL) capsule 50 mg  50 mg Oral TID PRN Dahlia Byes C, NP   50 mg at 09/27/23 1024   diphenhydrAMINE (BENADRYL) capsule 50 mg  50 mg Oral QHS Attiah, Nadir, MD   50 mg at 10/15/23 2030   docusate sodium (COLACE) capsule 100 mg  100 mg Oral Daily Onuoha, Josephine C, NP   100 mg at 10/15/23 0837   FLUoxetine (PROZAC) capsule 40 mg  40 mg Oral Daily Hill, Shelbie Hutching, MD   40 mg at 10/15/23 0837   influenza vac split trivalent PF (FLULAVAL) injection  0.5 mL  0.5 mL Intramuscular Tomorrow-1000 Massengill, Harrold Donath, MD       levETIRAcetam (KEPPRA) tablet 500 mg  500 mg Oral BID Dahlia Byes C, NP   500 mg at 10/15/23 1711   levothyroxine (SYNTHROID) tablet 50 mcg  50 mcg Oral Q0600 Dahlia Byes C, NP   50 mcg at 10/16/23 9811   LORazepam (ATIVAN) tablet 0.5 mg  0.5 mg Oral Q8H PRN Dahlia Byes C, NP   0.5 mg at 10/12/23 0707   LORazepam (ATIVAN) tablet 1 mg  1 mg Oral BID Abbott Pao, Nadir, MD   1 mg at 10/15/23 1710   LORazepam (ATIVAN) tablet 2 mg  2 mg Oral TID PRN Dahlia Byes C, NP   2 mg at 09/27/23 1023   mupirocin cream (BACTROBAN) 2 %   Topical Daily Massengill,  Harrold Donath, MD   Given at 10/15/23 0840   ondansetron (ZOFRAN) tablet 8 mg  8 mg Oral Q8H PRN Augusto Gamble, MD       pantoprazole (PROTONIX) EC tablet 80 mg  80 mg Oral Daily Onuoha, Josephine C, NP   80 mg at 10/15/23 0837   polyethylene glycol (MIRALAX / GLYCOLAX) packet 17 g  17 g Oral Daily PRN Augusto Gamble, MD       senna (SENOKOT) tablet 8.6 mg  1 tablet Oral QHS PRN Augusto Gamble, MD        Lab Results:  No results found for this or any previous visit (from the past 48 hour(s)).   Blood Alcohol level:  Lab Results  Component Value Date   ETH <10 09/19/2023   ETH <10 09/28/2019    Metabolic Disorder Labs: Lab Results  Component Value Date   HGBA1C 5.7 (H) 09/23/2023   MPG 116.89 09/23/2023   MPG 114 02/25/2014   No results found for: "PROLACTIN" Lab Results  Component Value Date   CHOL 144 07/03/2023   TRIG 152 (H) 07/03/2023   HDL 37 (L) 07/03/2023   CHOLHDL 3.9 07/03/2023   VLDL 28 10/20/2009   LDLCALC 82 07/03/2023   LDLCALC 59 10/02/2021    Physical Findings: AIMS: Facial and Oral Movements Muscles of Facial Expression: Mild Lips and Perioral Area: Minimal, may be extreme normal Jaw: Minimal, may be extreme normal Tongue: Minimal, may be extreme normal,Extremity Movements Upper (arms, wrists, hands, fingers): Mild Lower (legs,  knees, ankles, toes): Mild, Trunk Movements Neck, shoulders, hips: Moderate, Global Judgements Severity of abnormal movements overall : Moderate Incapacitation due to abnormal movements: Moderate Patient's awareness of abnormal movements: Aware, moderate distress, Dental Status Current problems with teeth and/or dentures?: No Does patient usually wear dentures?: No Edentia?: No  CIWA:    COWS:     Musculoskeletal: Strength & Muscle Tone: within normal limits Gait & Station: unsteady Patient leans: Front  Psychiatric Specialty Exam:  Presentation  General Appearance:  Appropriate for Environment  Eye Contact: Good  Speech: Slow  Speech Volume: Normal  Handedness: Right   Mood and Affect  Mood: Anxious  Affect: Restricted   Thought Process  Thought Processes: Linear  Descriptions of Associations:Intact  Orientation:Full (Time, Place and Person)  Thought Content:Rumination  History of Schizophrenia/Schizoaffective disorder:No data recorded Duration of Psychotic Symptoms:No data recorded Hallucinations:No data recorded   Ideas of Reference:None  Suicidal Thoughts:No data recorded   Homicidal Thoughts:No data recorded    Sensorium  Memory: Immediate Fair; Recent Fair  Judgment: Fair  Insight: Fair   Art therapist  Concentration: Fair  Attention Span: Fair  Recall: Poor  Fund of Knowledge: Fair  Language: Fair   Psychomotor Activity  Psychomotor Activity:No data recorded   Assets  Assets: Communication Skills; Desire for Improvement; Housing   Sleep  Sleep: No data recorded      Physical Exam: Physical Exam Vitals and nursing note reviewed.  Constitutional:      Appearance: Normal appearance.  HENT:     Head: Normocephalic and atraumatic.  Eyes:     Extraocular Movements: Extraocular movements intact.  Pulmonary:     Effort: Pulmonary effort is normal.  Musculoskeletal:        General: Normal  range of motion.     Cervical back: Normal range of motion.  Neurological:     Mental Status: He is alert.     Motor: Tremor present.  Psychiatric:  Mood and Affect: Mood is anxious. Affect is restricted.      Review of Systems  Gastrointestinal:  Negative for constipation and diarrhea.  Genitourinary:  Negative for dysuria.  Musculoskeletal:  Negative for myalgias.  Neurological:  Positive for tremors.   Blood pressure 120/80, pulse 96, temperature 97.7 F (36.5 C), temperature source Oral, resp. rate 18, height 6\' 3"  (1.905 m), weight 94.8 kg, SpO2 99%. Body mass index is 26.12 kg/m.   Treatment Plan Summary: Daily contact with patient to assess and evaluate symptoms and progress in treatment and Medication management     ASSESSMENT: Brady Hoffman is a 57 year old Caucasian male with prior psychiatric history significant for schizoaffective disorder, seizure disorder, developmental delay, alcohol use disorder, who presents voluntarily [facility] from [facility] health ED at [facility] for evaluation for his mental health illness and frequent incidence of falls. After medical evaluation/stabilization & clearance, he was transferred to the [facility] for further psychiatric evaluation & treatments.   Patient was initially planned to be discharged 11/4, but appeared to have regression in obsessive behaviors/rituals and exacerbation of psychosis     PLAN: Safety and Monitoring:              -- Voluntary admission to inpatient psychiatric unit for safety, stabilization and treatment             -- Daily contact with patient to assess and evaluate symptoms and progress in treatment             -- Patient's case to be discussed in multi-disciplinary team meeting             -- Observation Level : 1:1 level of observation continued             -- Vital signs:  q12 hours             -- Precautions: suicide, elopement, and assault   2. Interventions (medications, psychoeducation, etc):   Clozapine: Clozapine 100mg  PO QAM and 200mg  PO at bedtime  Repeat Ativan 1 mg PO as needed for onset of any new episodes   Continue Ativan 1 mg p.o. twice daily for akathisia Continue Benadryl 50 mg at bedtime for concern for EPS symptoms  reduced fluoxetine 40 mg daily - this medication has been associated with restlessness at high doses Added amantadine 100mg  PO daily for the tremor Room lockout after breakfast until lunch to encourage some ambulation and interaction     -- removed haloperidol from agitation protocol due to worsening cholinergic symptoms -- medical management: d/c atropine at bedtime for droolingpt now having dry mouth, aspirin, atorvastatin, docusate sodium, levetiracetam, levothyroxine, and pantoprazole -- Patient does not need nicotine replacement   PRN medications for symptomatic management:              -- lorazepam 0.5 mg every 8 hours as needed for anxiety              -- continue acetaminophen 650 mg every 6 hours as needed for mild to moderate pain, fever, and headaches              -- continue bismuth subsalicylate 524 mg oral chewable tablet every 3 hours as needed for diarrhea / loose stools              -- continue senna 8.6 mg oral at bedtime and polyethylene glycol 17 g oral daily as needed for mild to moderate constipation              --  continue ondansetron 8 mg every 8 hours as needed for nausea or vomiting              -- continue aluminum-magnesium hydroxide + simethicone 30 mL every 4 hours as needed for heartburn or indigestion             -- As needed agitation protocol in-place   The risks/benefits/side-effects/alternatives to the above medication were discussed in detail with the patient and time was given for questions. The patient consents to medication trial. FDA black box warnings, if present, were discussed.   The patient is agreeable with the medication plan, as above. We will monitor the patient's response to pharmacologic treatment, and  adjust medications as necessary.   3. Routine and other pertinent labs: Metabolic profile: UrineBMI: Body mass index is 26.12 kg/m. Clozapine on 10/19 is 1723, 701 on 10/27, 471 (therapeutic) on 10/06/23 EKG monitoring: QTc: 479 on 09/27/2023, 449 on 10/05/23      4. Group Therapy:             -- Encouraged patient to participate in unit milieu and in scheduled group therapies              -- Short Term Goals: Ability to identify changes in lifestyle to reduce recurrence of condition, verbalize feelings, identify and develop effective coping behaviors, maintain clinical measurements within normal limits, and identify triggers associated with substance abuse/mental health issues will improve. Improvement in ability to demonstrate self-control and comply with prescribed medications.             -- Long Term Goals: Improvement in symptoms so as ready for discharge -- Patient is encouraged to participate in group therapy while admitted to the psychiatric unit. -- We will address other chronic and acute stressors, which contributed to the patient's Schizoaffective disorder, depressive type (HCC) in order to reduce the risk of self-harm at discharge.   5. Discharge Planning:              -- Social work and case management to assist with discharge planning and identification of hospital follow-up needs prior to discharge             -- Estimated LOS: 11/13             -- Discharge Concerns: Need to establish a safety plan; Medication compliance and effectiveness             -- Discharge Goals: Return to the group home with outpatient referrals for mental health follow-up including medication management/psychotherapy   I certify that inpatient services furnished can reasonably be expected to improve the patient's condition  Golda Acre, MD 10/16/2023, 8:25 AM

## 2023-10-16 NOTE — Progress Notes (Signed)
  [facility] Adult Case Management Discharge Plan :  Will you be returning to the same living situation after discharge:  Yes,  Pt will be returning back to the group home  At discharge, do you have transportation home?: Yes,  Ms. Freida Busman will arrange a ride for patient around 1:00/ 1:15 pm  Do you have the ability to pay for your medications: Yes,  Medicare Part A/ B  Release of information consent forms completed and in the chart;  Patient's signature needed at discharge.  Patient to Follow up at:  Follow-up Information     Thedore Mins, MD Follow up on 10/22/2023.   Specialty: Psychiatry Why: You have an appointment for medication management services on 10/22/23 at 2:20 pm, Virtual. Contact information: [address] [place] [address] [phone]         [place] Go on 10/17/2023.   Specialty: Professional Counselor Why: Please go to this provider for therapy services on Friday @ 9:00 AM to there walk in hours since you are a new patients: Walk-in hours are Monday through Friday, from 9 am to 1 pm. Contact information: [address] [place] [address] [phone]                 Next level of care provider has access to [facility] Link:yes  Safety Planning and Suicide Prevention discussed: Yes,  Roena Malady ( house director ) [phone]     Has patient been referred to the Quitline?: Patient does not use tobacco/nicotine products  Patient has been referred for addiction treatment: No known substance use disorder.  Isabella Bowens, LCSWA 10/16/2023, 10:17 AM

## 2023-10-16 NOTE — Progress Notes (Signed)
1:1 Note: Patient maintained on constant supervision for safety.  Medication given as prescribed.  Routine safety checks maintained.  Patient presents with anxious affect and mood.  Denies suicidal thoughts, auditory and visual hallucinations.  Patient is safe on the unit with supervision.

## 2023-11-02 DEATH — deceased
